# Patient Record
Sex: Female | Born: 1955 | Race: White | Hispanic: No | Marital: Single | Smoking: Former smoker
Health system: Southern US, Community
[De-identification: ages and names within clinical notes are randomized; demographics above are authoritative.]

## PROBLEM LIST (undated history)

## (undated) DIAGNOSIS — R238 Other skin changes: Secondary | ICD-10-CM

## (undated) DIAGNOSIS — Z85828 Personal history of other malignant neoplasm of skin: Secondary | ICD-10-CM

## (undated) DIAGNOSIS — Z9889 Other specified postprocedural states: Secondary | ICD-10-CM

## (undated) DIAGNOSIS — K5909 Other constipation: Secondary | ICD-10-CM

## (undated) DIAGNOSIS — Z972 Presence of dental prosthetic device (complete) (partial): Secondary | ICD-10-CM

## (undated) DIAGNOSIS — R911 Solitary pulmonary nodule: Secondary | ICD-10-CM

## (undated) DIAGNOSIS — Z973 Presence of spectacles and contact lenses: Secondary | ICD-10-CM

## (undated) DIAGNOSIS — K219 Gastro-esophageal reflux disease without esophagitis: Secondary | ICD-10-CM

## (undated) DIAGNOSIS — F419 Anxiety disorder, unspecified: Secondary | ICD-10-CM

## (undated) DIAGNOSIS — G894 Chronic pain syndrome: Secondary | ICD-10-CM

## (undated) DIAGNOSIS — J449 Chronic obstructive pulmonary disease, unspecified: Secondary | ICD-10-CM

## (undated) DIAGNOSIS — K603 Anal fistula, unspecified: Secondary | ICD-10-CM

## (undated) DIAGNOSIS — M1610 Unilateral primary osteoarthritis, unspecified hip: Secondary | ICD-10-CM

## (undated) DIAGNOSIS — I779 Disorder of arteries and arterioles, unspecified: Secondary | ICD-10-CM

## (undated) DIAGNOSIS — R233 Spontaneous ecchymoses: Secondary | ICD-10-CM

## (undated) DIAGNOSIS — Z87898 Personal history of other specified conditions: Secondary | ICD-10-CM

## (undated) DIAGNOSIS — G5620 Lesion of ulnar nerve, unspecified upper limb: Secondary | ICD-10-CM

## (undated) DIAGNOSIS — I10 Essential (primary) hypertension: Secondary | ICD-10-CM

## (undated) DIAGNOSIS — Z8619 Personal history of other infectious and parasitic diseases: Secondary | ICD-10-CM

## (undated) DIAGNOSIS — F329 Major depressive disorder, single episode, unspecified: Secondary | ICD-10-CM

## (undated) DIAGNOSIS — R112 Nausea with vomiting, unspecified: Secondary | ICD-10-CM

## (undated) DIAGNOSIS — Z8489 Family history of other specified conditions: Secondary | ICD-10-CM

## (undated) DIAGNOSIS — D013 Carcinoma in situ of anus and anal canal: Secondary | ICD-10-CM

## (undated) DIAGNOSIS — G4700 Insomnia, unspecified: Secondary | ICD-10-CM

## (undated) DIAGNOSIS — M4802 Spinal stenosis, cervical region: Secondary | ICD-10-CM

## (undated) DIAGNOSIS — M1990 Unspecified osteoarthritis, unspecified site: Secondary | ICD-10-CM

## (undated) DIAGNOSIS — F32A Depression, unspecified: Secondary | ICD-10-CM

## (undated) DIAGNOSIS — I739 Peripheral vascular disease, unspecified: Secondary | ICD-10-CM

## (undated) DIAGNOSIS — I6502 Occlusion and stenosis of left vertebral artery: Secondary | ICD-10-CM

## (undated) HISTORY — DX: Anxiety disorder, unspecified: F41.9

## (undated) HISTORY — DX: Depression, unspecified: F32.A

## (undated) HISTORY — DX: Spontaneous ecchymoses: R23.3

## (undated) HISTORY — DX: Lesion of ulnar nerve, unspecified upper limb: G56.20

## (undated) HISTORY — PX: LAPAROSCOPIC SALPINGOOPHERECTOMY: SUR795

## (undated) HISTORY — PX: COLONOSCOPY: SHX174

## (undated) HISTORY — PX: HEMORRHOID SURGERY: SHX153

## (undated) HISTORY — DX: Unilateral primary osteoarthritis, unspecified hip: M16.10

## (undated) HISTORY — PX: ABDOMINAL HYSTERECTOMY: SHX81

## (undated) HISTORY — DX: Insomnia, unspecified: G47.00

## (undated) HISTORY — DX: Solitary pulmonary nodule: R91.1

## (undated) HISTORY — DX: Gastro-esophageal reflux disease without esophagitis: K21.9

## (undated) HISTORY — DX: Chronic pain syndrome: G89.4

## (undated) HISTORY — DX: Spinal stenosis, cervical region: M48.02

## (undated) HISTORY — PX: OTHER SURGICAL HISTORY: SHX169

## (undated) HISTORY — DX: Major depressive disorder, single episode, unspecified: F32.9

## (undated) HISTORY — DX: Other skin changes: R23.8

## (undated) HISTORY — PX: CHOLECYSTECTOMY: SHX55

---

## 1999-03-03 ENCOUNTER — Encounter: Payer: Self-pay | Admitting: Emergency Medicine

## 1999-03-03 ENCOUNTER — Emergency Department (HOSPITAL_COMMUNITY): Admission: EM | Admit: 1999-03-03 | Discharge: 1999-03-03 | Payer: Self-pay | Admitting: Emergency Medicine

## 1999-03-17 ENCOUNTER — Emergency Department (HOSPITAL_COMMUNITY): Admission: EM | Admit: 1999-03-17 | Discharge: 1999-03-17 | Payer: Self-pay | Admitting: Emergency Medicine

## 1999-03-17 ENCOUNTER — Encounter: Payer: Self-pay | Admitting: Emergency Medicine

## 1999-05-16 ENCOUNTER — Encounter: Payer: Self-pay | Admitting: Orthopedic Surgery

## 1999-05-16 ENCOUNTER — Ambulatory Visit (HOSPITAL_BASED_OUTPATIENT_CLINIC_OR_DEPARTMENT_OTHER): Admission: RE | Admit: 1999-05-16 | Discharge: 1999-05-16 | Payer: Self-pay | Admitting: Orthopedic Surgery

## 1999-06-13 ENCOUNTER — Ambulatory Visit (HOSPITAL_BASED_OUTPATIENT_CLINIC_OR_DEPARTMENT_OTHER): Admission: RE | Admit: 1999-06-13 | Discharge: 1999-06-13 | Payer: Self-pay | Admitting: Orthopedic Surgery

## 2000-03-29 ENCOUNTER — Encounter: Payer: Self-pay | Admitting: Family Medicine

## 2000-03-29 ENCOUNTER — Encounter: Admission: RE | Admit: 2000-03-29 | Discharge: 2000-03-29 | Payer: Self-pay | Admitting: Family Medicine

## 2000-04-12 ENCOUNTER — Encounter: Admission: RE | Admit: 2000-04-12 | Discharge: 2000-04-12 | Payer: Self-pay | Admitting: Family Medicine

## 2000-04-12 ENCOUNTER — Encounter: Payer: Self-pay | Admitting: Family Medicine

## 2000-05-03 ENCOUNTER — Ambulatory Visit (HOSPITAL_COMMUNITY): Admission: RE | Admit: 2000-05-03 | Discharge: 2000-05-03 | Payer: Self-pay | Admitting: Gastroenterology

## 2000-05-23 ENCOUNTER — Ambulatory Visit (HOSPITAL_BASED_OUTPATIENT_CLINIC_OR_DEPARTMENT_OTHER): Admission: RE | Admit: 2000-05-23 | Discharge: 2000-05-24 | Payer: Self-pay | Admitting: Orthopedic Surgery

## 2001-09-10 HISTORY — PX: ORIF CLAVICLE FRACTURE: SUR924

## 2002-06-28 ENCOUNTER — Encounter: Payer: Self-pay | Admitting: General Surgery

## 2002-06-28 ENCOUNTER — Inpatient Hospital Stay (HOSPITAL_COMMUNITY): Admission: AC | Admit: 2002-06-28 | Discharge: 2002-07-02 | Payer: Self-pay

## 2002-06-28 ENCOUNTER — Encounter: Payer: Self-pay | Admitting: Emergency Medicine

## 2002-07-16 ENCOUNTER — Encounter: Payer: Self-pay | Admitting: Obstetrics and Gynecology

## 2002-07-16 ENCOUNTER — Ambulatory Visit (HOSPITAL_COMMUNITY): Admission: RE | Admit: 2002-07-16 | Discharge: 2002-07-16 | Payer: Self-pay | Admitting: Obstetrics and Gynecology

## 2002-07-23 ENCOUNTER — Encounter: Payer: Self-pay | Admitting: Obstetrics and Gynecology

## 2002-07-29 ENCOUNTER — Inpatient Hospital Stay (HOSPITAL_COMMUNITY): Admission: RE | Admit: 2002-07-29 | Discharge: 2002-07-31 | Payer: Self-pay | Admitting: Obstetrics and Gynecology

## 2004-07-27 ENCOUNTER — Ambulatory Visit (HOSPITAL_COMMUNITY): Admission: RE | Admit: 2004-07-27 | Discharge: 2004-07-27 | Payer: Self-pay | Admitting: Orthopedic Surgery

## 2004-08-22 ENCOUNTER — Ambulatory Visit (HOSPITAL_BASED_OUTPATIENT_CLINIC_OR_DEPARTMENT_OTHER): Admission: RE | Admit: 2004-08-22 | Discharge: 2004-08-22 | Payer: Self-pay | Admitting: Orthopedic Surgery

## 2004-10-04 ENCOUNTER — Ambulatory Visit: Payer: Self-pay | Admitting: Internal Medicine

## 2004-10-09 ENCOUNTER — Ambulatory Visit: Payer: Self-pay | Admitting: Internal Medicine

## 2004-10-10 ENCOUNTER — Ambulatory Visit: Payer: Self-pay | Admitting: *Deleted

## 2004-12-11 ENCOUNTER — Ambulatory Visit (HOSPITAL_COMMUNITY): Admission: RE | Admit: 2004-12-11 | Discharge: 2004-12-11 | Payer: Self-pay | Admitting: Orthopedic Surgery

## 2004-12-15 ENCOUNTER — Encounter (INDEPENDENT_AMBULATORY_CARE_PROVIDER_SITE_OTHER): Payer: Self-pay | Admitting: Internal Medicine

## 2005-11-05 ENCOUNTER — Ambulatory Visit (HOSPITAL_COMMUNITY): Admission: RE | Admit: 2005-11-05 | Discharge: 2005-11-05 | Payer: Self-pay | Admitting: Obstetrics and Gynecology

## 2005-11-19 ENCOUNTER — Emergency Department (HOSPITAL_COMMUNITY): Admission: EM | Admit: 2005-11-19 | Discharge: 2005-11-19 | Payer: Self-pay | Admitting: Emergency Medicine

## 2006-04-27 ENCOUNTER — Emergency Department (HOSPITAL_COMMUNITY): Admission: EM | Admit: 2006-04-27 | Discharge: 2006-04-27 | Payer: Self-pay | Admitting: Emergency Medicine

## 2006-09-10 DIAGNOSIS — M4802 Spinal stenosis, cervical region: Secondary | ICD-10-CM

## 2006-09-10 HISTORY — DX: Spinal stenosis, cervical region: M48.02

## 2006-11-07 ENCOUNTER — Ambulatory Visit (HOSPITAL_COMMUNITY): Admission: RE | Admit: 2006-11-07 | Discharge: 2006-11-07 | Payer: Self-pay | Admitting: Obstetrics and Gynecology

## 2006-11-11 ENCOUNTER — Emergency Department (HOSPITAL_COMMUNITY): Admission: EM | Admit: 2006-11-11 | Discharge: 2006-11-11 | Payer: Self-pay | Admitting: Emergency Medicine

## 2006-11-25 ENCOUNTER — Ambulatory Visit (HOSPITAL_COMMUNITY): Admission: RE | Admit: 2006-11-25 | Discharge: 2006-11-25 | Payer: Self-pay | Admitting: General Surgery

## 2006-11-25 ENCOUNTER — Encounter (INDEPENDENT_AMBULATORY_CARE_PROVIDER_SITE_OTHER): Payer: Self-pay | Admitting: Specialist

## 2006-11-30 ENCOUNTER — Emergency Department (HOSPITAL_COMMUNITY): Admission: EM | Admit: 2006-11-30 | Discharge: 2006-11-30 | Payer: Self-pay | Admitting: Family Medicine

## 2007-02-04 ENCOUNTER — Emergency Department (HOSPITAL_COMMUNITY): Admission: EM | Admit: 2007-02-04 | Discharge: 2007-02-04 | Payer: Self-pay | Admitting: Emergency Medicine

## 2008-06-17 ENCOUNTER — Emergency Department (HOSPITAL_COMMUNITY): Admission: EM | Admit: 2008-06-17 | Discharge: 2008-06-18 | Payer: Self-pay | Admitting: Emergency Medicine

## 2008-06-24 ENCOUNTER — Inpatient Hospital Stay (HOSPITAL_COMMUNITY): Admission: RE | Admit: 2008-06-24 | Discharge: 2008-06-27 | Payer: Self-pay | Admitting: Orthopedic Surgery

## 2008-08-25 ENCOUNTER — Encounter (INDEPENDENT_AMBULATORY_CARE_PROVIDER_SITE_OTHER): Payer: Self-pay | Admitting: Internal Medicine

## 2008-11-02 ENCOUNTER — Emergency Department (HOSPITAL_COMMUNITY): Admission: EM | Admit: 2008-11-02 | Discharge: 2008-11-02 | Payer: Self-pay | Admitting: Emergency Medicine

## 2009-03-09 ENCOUNTER — Encounter (INDEPENDENT_AMBULATORY_CARE_PROVIDER_SITE_OTHER): Payer: Self-pay | Admitting: Internal Medicine

## 2009-05-25 ENCOUNTER — Ambulatory Visit: Payer: Self-pay | Admitting: Infectious Diseases

## 2009-05-25 ENCOUNTER — Encounter (INDEPENDENT_AMBULATORY_CARE_PROVIDER_SITE_OTHER): Payer: Self-pay | Admitting: Internal Medicine

## 2009-05-25 ENCOUNTER — Ambulatory Visit (HOSPITAL_COMMUNITY): Admission: RE | Admit: 2009-05-25 | Discharge: 2009-05-25 | Payer: Self-pay | Admitting: Infectious Diseases

## 2009-05-25 DIAGNOSIS — IMO0002 Reserved for concepts with insufficient information to code with codable children: Secondary | ICD-10-CM | POA: Insufficient documentation

## 2009-05-25 DIAGNOSIS — R079 Chest pain, unspecified: Secondary | ICD-10-CM | POA: Insufficient documentation

## 2009-05-25 DIAGNOSIS — H547 Unspecified visual loss: Secondary | ICD-10-CM | POA: Insufficient documentation

## 2009-05-25 DIAGNOSIS — G4700 Insomnia, unspecified: Secondary | ICD-10-CM | POA: Insufficient documentation

## 2009-05-25 DIAGNOSIS — F418 Other specified anxiety disorders: Secondary | ICD-10-CM | POA: Insufficient documentation

## 2009-05-25 DIAGNOSIS — M5030 Other cervical disc degeneration, unspecified cervical region: Secondary | ICD-10-CM | POA: Insufficient documentation

## 2009-05-25 DIAGNOSIS — N809 Endometriosis, unspecified: Secondary | ICD-10-CM | POA: Insufficient documentation

## 2009-05-26 LAB — CONVERTED CEMR LAB
Cholesterol: 228 mg/dL — ABNORMAL HIGH (ref 0–200)
HCT: 42.1 % (ref 36.0–46.0)
HDL: 61 mg/dL (ref >39)
Hemoglobin: 14.9 g/dL (ref 12.0–15.0)
LDL Cholesterol: 122 mg/dL — ABNORMAL HIGH (ref 0–99)
MCHC: 35.3 g/dL (ref 30.0–36.0)
MCV: 96.1 fL (ref >=78.0)
Platelets: 248 10*3/uL (ref 150–400)
RBC: 4.38 M/uL (ref 3.87–5.11)
RDW: 13.7 % (ref 11.5–15.5)
TSH: 0.807 microintl units/mL (ref 0.350–4.5)
Total CHOL/HDL Ratio: 3.7
Total CK: 59 units/L (ref 7–177)
Triglycerides: 223 mg/dL — ABNORMAL HIGH (ref <150)
Troponin I: 0.01 ng/mL (ref <0.06)
VLDL: 45 mg/dL — ABNORMAL HIGH (ref 0–40)
WBC: 6.7 10*3/uL (ref 4.0–10.5)

## 2009-05-31 ENCOUNTER — Telehealth: Payer: Self-pay | Admitting: Licensed Clinical Social Worker

## 2009-06-08 ENCOUNTER — Encounter (INDEPENDENT_AMBULATORY_CARE_PROVIDER_SITE_OTHER): Payer: Self-pay | Admitting: Internal Medicine

## 2009-06-08 ENCOUNTER — Encounter: Payer: Self-pay | Admitting: Licensed Clinical Social Worker

## 2009-06-08 DIAGNOSIS — J984 Other disorders of lung: Secondary | ICD-10-CM | POA: Insufficient documentation

## 2009-06-09 ENCOUNTER — Ambulatory Visit (HOSPITAL_COMMUNITY): Admission: RE | Admit: 2009-06-09 | Discharge: 2009-06-09 | Payer: Self-pay | Admitting: Internal Medicine

## 2009-06-22 ENCOUNTER — Ambulatory Visit: Payer: Self-pay | Admitting: Internal Medicine

## 2009-06-22 DIAGNOSIS — K219 Gastro-esophageal reflux disease without esophagitis: Secondary | ICD-10-CM | POA: Insufficient documentation

## 2009-07-04 ENCOUNTER — Ambulatory Visit: Payer: Self-pay | Admitting: Internal Medicine

## 2009-07-04 LAB — CONVERTED CEMR LAB
OCCULT 1: NEGATIVE
OCCULT 2: NEGATIVE
OCCULT 3: NEGATIVE

## 2009-07-20 ENCOUNTER — Encounter: Admission: RE | Admit: 2009-07-20 | Discharge: 2009-08-31 | Payer: Self-pay | Admitting: Internal Medicine

## 2009-07-26 ENCOUNTER — Encounter (INDEPENDENT_AMBULATORY_CARE_PROVIDER_SITE_OTHER): Payer: Self-pay | Admitting: Internal Medicine

## 2009-07-27 ENCOUNTER — Encounter (INDEPENDENT_AMBULATORY_CARE_PROVIDER_SITE_OTHER): Payer: Self-pay | Admitting: Internal Medicine

## 2009-08-15 ENCOUNTER — Emergency Department (HOSPITAL_COMMUNITY): Admission: EM | Admit: 2009-08-15 | Discharge: 2009-08-15 | Payer: Self-pay | Admitting: Emergency Medicine

## 2009-08-17 ENCOUNTER — Ambulatory Visit: Payer: Self-pay | Admitting: Internal Medicine

## 2009-08-17 DIAGNOSIS — N951 Menopausal and female climacteric states: Secondary | ICD-10-CM | POA: Insufficient documentation

## 2009-09-12 ENCOUNTER — Telehealth: Payer: Self-pay | Admitting: Internal Medicine

## 2009-10-12 ENCOUNTER — Telehealth: Payer: Self-pay | Admitting: Internal Medicine

## 2009-10-20 ENCOUNTER — Telehealth: Payer: Self-pay | Admitting: *Deleted

## 2009-10-20 ENCOUNTER — Ambulatory Visit: Payer: Self-pay | Admitting: Internal Medicine

## 2009-10-20 ENCOUNTER — Telehealth: Payer: Self-pay | Admitting: Internal Medicine

## 2009-10-20 DIAGNOSIS — J069 Acute upper respiratory infection, unspecified: Secondary | ICD-10-CM | POA: Insufficient documentation

## 2009-11-14 ENCOUNTER — Telehealth (INDEPENDENT_AMBULATORY_CARE_PROVIDER_SITE_OTHER): Payer: Self-pay | Admitting: Internal Medicine

## 2009-12-06 ENCOUNTER — Ambulatory Visit: Payer: Self-pay | Admitting: Internal Medicine

## 2009-12-06 DIAGNOSIS — G5600 Carpal tunnel syndrome, unspecified upper limb: Secondary | ICD-10-CM | POA: Insufficient documentation

## 2010-01-05 ENCOUNTER — Telehealth (INDEPENDENT_AMBULATORY_CARE_PROVIDER_SITE_OTHER): Payer: Self-pay | Admitting: Internal Medicine

## 2010-01-18 ENCOUNTER — Telehealth (INDEPENDENT_AMBULATORY_CARE_PROVIDER_SITE_OTHER): Payer: Self-pay | Admitting: Internal Medicine

## 2010-01-18 ENCOUNTER — Encounter
Admission: RE | Admit: 2010-01-18 | Discharge: 2010-01-20 | Payer: Self-pay | Admitting: Physical Medicine and Rehabilitation

## 2010-01-20 ENCOUNTER — Ambulatory Visit: Payer: Self-pay | Admitting: Physical Medicine and Rehabilitation

## 2010-01-31 ENCOUNTER — Emergency Department (HOSPITAL_COMMUNITY): Admission: EM | Admit: 2010-01-31 | Discharge: 2010-01-31 | Payer: Self-pay | Admitting: Family Medicine

## 2010-02-01 ENCOUNTER — Telehealth (INDEPENDENT_AMBULATORY_CARE_PROVIDER_SITE_OTHER): Payer: Self-pay | Admitting: Internal Medicine

## 2010-05-31 ENCOUNTER — Encounter
Admission: RE | Admit: 2010-05-31 | Discharge: 2010-08-23 | Payer: Self-pay | Source: Home / Self Care | Attending: Physical Medicine and Rehabilitation | Admitting: Physical Medicine and Rehabilitation

## 2010-06-02 ENCOUNTER — Ambulatory Visit: Payer: Self-pay | Admitting: Physical Medicine and Rehabilitation

## 2010-06-06 ENCOUNTER — Ambulatory Visit: Payer: Self-pay | Admitting: Internal Medicine

## 2010-06-06 ENCOUNTER — Ambulatory Visit (HOSPITAL_COMMUNITY)
Admission: RE | Admit: 2010-06-06 | Discharge: 2010-06-06 | Payer: Self-pay | Admitting: Physical Medicine and Rehabilitation

## 2010-06-06 DIAGNOSIS — R634 Abnormal weight loss: Secondary | ICD-10-CM | POA: Insufficient documentation

## 2010-06-06 DIAGNOSIS — IMO0002 Reserved for concepts with insufficient information to code with codable children: Secondary | ICD-10-CM | POA: Insufficient documentation

## 2010-06-07 ENCOUNTER — Encounter: Payer: Self-pay | Admitting: Internal Medicine

## 2010-06-14 ENCOUNTER — Ambulatory Visit (HOSPITAL_COMMUNITY): Admission: RE | Admit: 2010-06-14 | Discharge: 2010-06-14 | Payer: Self-pay | Admitting: Internal Medicine

## 2010-06-14 LAB — CONVERTED CEMR LAB
ALT: 61 units/L — ABNORMAL HIGH (ref 0–35)
AST: 66 units/L — ABNORMAL HIGH (ref 0–37)
Albumin: 4.5 g/dL (ref 3.5–5.2)
Alkaline Phosphatase: 103 units/L (ref 39–117)
BUN: 11 mg/dL (ref 6–23)
Basophils Absolute: 0 10*3/uL (ref 0.0–0.1)
Basophils Relative: 1 % (ref 0–1)
CO2: 22 meq/L (ref 19–32)
Calcium: 9.5 mg/dL (ref 8.4–10.5)
Chloride: 104 meq/L (ref 96–112)
Creatinine, Ser: 0.58 mg/dL (ref 0.40–1.20)
Eosinophils Absolute: 0.1 10*3/uL (ref 0.0–0.7)
Eosinophils Relative: 2 % (ref 0–5)
Glucose, Bld: 84 mg/dL (ref 70–99)
HCT: 42.8 % (ref 36.0–46.0)
Hemoglobin: 14.6 g/dL (ref 12.0–15.0)
Lymphocytes Relative: 31 % (ref 12–46)
Lymphs Abs: 1.8 10*3/uL (ref 0.7–4.0)
MCHC: 34.1 g/dL (ref 30.0–36.0)
MCV: 96.4 fL (ref >=78.0)
Monocytes Absolute: 0.6 10*3/uL (ref 0.1–1.0)
Monocytes Relative: 11 % (ref 3–12)
Neutro Abs: 3.2 10*3/uL (ref 1.7–7.7)
Neutrophils Relative %: 56 % (ref 43–77)
Platelets: 245 10*3/uL (ref 150–400)
Potassium: 4.6 meq/L (ref 3.5–5.3)
RBC: 4.44 M/uL (ref 3.87–5.11)
RDW: 13.8 % (ref 11.5–15.5)
Sodium: 139 meq/L (ref 135–145)
TSH: 1.113 microintl units/mL (ref 0.350–4.5)
Total Bilirubin: 0.5 mg/dL (ref 0.3–1.2)
Total Protein: 8.3 g/dL (ref 6.0–8.3)
Vitamin B-12: 717 pg/mL (ref 211–911)
WBC: 5.7 10*3/uL (ref 4.0–10.5)

## 2010-06-14 LAB — HM MAMMOGRAPHY: HM Mammogram: NEGATIVE

## 2010-06-15 ENCOUNTER — Ambulatory Visit: Payer: Self-pay | Admitting: Internal Medicine

## 2010-06-15 ENCOUNTER — Encounter: Payer: Self-pay | Admitting: Internal Medicine

## 2010-06-15 DIAGNOSIS — Z8619 Personal history of other infectious and parasitic diseases: Secondary | ICD-10-CM | POA: Insufficient documentation

## 2010-06-20 ENCOUNTER — Telehealth (INDEPENDENT_AMBULATORY_CARE_PROVIDER_SITE_OTHER): Payer: Self-pay | Admitting: *Deleted

## 2010-06-22 LAB — CONVERTED CEMR LAB
HCV Ab: REACTIVE — AB
Hep A Total Ab: POSITIVE — AB
Hep B Core Total Ab: NEGATIVE
Hep B S Ab: NEGATIVE
Hepatitis B Surface Ag: NEGATIVE

## 2010-06-26 ENCOUNTER — Telehealth: Payer: Self-pay | Admitting: Internal Medicine

## 2010-06-27 ENCOUNTER — Ambulatory Visit (HOSPITAL_COMMUNITY): Admission: RE | Admit: 2010-06-27 | Discharge: 2010-06-27 | Payer: Self-pay | Admitting: Internal Medicine

## 2010-06-30 ENCOUNTER — Ambulatory Visit: Payer: Self-pay | Admitting: Physical Medicine and Rehabilitation

## 2010-07-11 ENCOUNTER — Encounter
Admission: RE | Admit: 2010-07-11 | Discharge: 2010-08-23 | Payer: Self-pay | Source: Home / Self Care | Attending: Physical Medicine & Rehabilitation | Admitting: Physical Medicine & Rehabilitation

## 2010-07-16 ENCOUNTER — Emergency Department (HOSPITAL_COMMUNITY)
Admission: EM | Admit: 2010-07-16 | Discharge: 2010-07-16 | Payer: Self-pay | Source: Home / Self Care | Admitting: Emergency Medicine

## 2010-07-16 DIAGNOSIS — S92909A Unspecified fracture of unspecified foot, initial encounter for closed fracture: Secondary | ICD-10-CM | POA: Insufficient documentation

## 2010-07-17 ENCOUNTER — Ambulatory Visit: Payer: Self-pay | Admitting: Physical Medicine & Rehabilitation

## 2010-08-14 ENCOUNTER — Ambulatory Visit (HOSPITAL_COMMUNITY)
Admission: RE | Admit: 2010-08-14 | Discharge: 2010-08-14 | Payer: Self-pay | Source: Home / Self Care | Admitting: Internal Medicine

## 2010-08-14 ENCOUNTER — Ambulatory Visit: Payer: Self-pay | Admitting: Internal Medicine

## 2010-08-16 ENCOUNTER — Ambulatory Visit: Payer: Self-pay | Admitting: Physical Medicine and Rehabilitation

## 2010-08-22 ENCOUNTER — Ambulatory Visit: Payer: Self-pay | Admitting: Internal Medicine

## 2010-08-22 DIAGNOSIS — R1011 Right upper quadrant pain: Secondary | ICD-10-CM | POA: Insufficient documentation

## 2010-08-22 LAB — CONVERTED CEMR LAB
ALT: 64 units/L — ABNORMAL HIGH (ref 0–35)
AST: 78 units/L — ABNORMAL HIGH (ref 0–37)
Albumin: 4.3 g/dL (ref 3.5–5.2)
Alkaline Phosphatase: 114 units/L (ref 39–117)
BUN: 8 mg/dL (ref 6–23)
Basophils Absolute: 0 10*3/uL (ref 0.0–0.1)
Basophils Relative: 0 % (ref 0–1)
CO2: 27 meq/L (ref 19–32)
Calcium: 9.8 mg/dL (ref 8.4–10.5)
Chloride: 101 meq/L (ref 96–112)
Creatinine, Ser: 0.54 mg/dL (ref 0.40–1.20)
Eosinophils Absolute: 0.2 10*3/uL (ref 0.0–0.7)
Eosinophils Relative: 3 % (ref 0–5)
Glucose, Bld: 95 mg/dL (ref 70–99)
HCT: 41.5 % (ref 36.0–46.0)
HCV Quantitative: 653 intl units/mL — ABNORMAL HIGH (ref <43)
Hemoglobin: 14.1 g/dL (ref 12.0–15.0)
INR: 0.97 (ref <1.50)
Lymphocytes Relative: 27 % (ref 12–46)
Lymphs Abs: 1.7 10*3/uL (ref 0.7–4.0)
MCHC: 34 g/dL (ref 30.0–36.0)
MCV: 95.4 fL (ref 78.0–100.0)
Monocytes Absolute: 0.6 10*3/uL (ref 0.1–1.0)
Monocytes Relative: 10 % (ref 3–12)
Neutro Abs: 3.8 10*3/uL (ref 1.7–7.7)
Neutrophils Relative %: 60 % (ref 43–77)
Platelets: 227 10*3/uL (ref 150–400)
Potassium: 4.3 meq/L (ref 3.5–5.3)
Prothrombin Time: 13.1 s (ref 11.6–15.2)
RBC: 4.35 M/uL (ref 3.87–5.11)
RDW: 13.3 % (ref 11.5–15.5)
Sodium: 136 meq/L (ref 135–145)
Total Bilirubin: 0.4 mg/dL (ref 0.3–1.2)
Total Protein: 7.6 g/dL (ref 6.0–8.3)
WBC: 6.3 10*3/uL (ref 4.0–10.5)

## 2010-08-23 ENCOUNTER — Encounter: Payer: Self-pay | Admitting: Internal Medicine

## 2010-08-23 ENCOUNTER — Ambulatory Visit: Payer: Self-pay | Admitting: Physical Medicine and Rehabilitation

## 2010-09-05 ENCOUNTER — Ambulatory Visit: Payer: Self-pay

## 2010-09-12 ENCOUNTER — Ambulatory Visit: Admission: RE | Admit: 2010-09-12 | Discharge: 2010-09-12 | Payer: Self-pay | Source: Home / Self Care

## 2010-09-12 ENCOUNTER — Ambulatory Visit
Admission: RE | Admit: 2010-09-12 | Discharge: 2010-09-12 | Payer: Self-pay | Source: Home / Self Care | Attending: Family Medicine | Admitting: Family Medicine

## 2010-09-12 DIAGNOSIS — S6390XA Sprain of unspecified part of unspecified wrist and hand, initial encounter: Secondary | ICD-10-CM | POA: Insufficient documentation

## 2010-09-25 ENCOUNTER — Encounter
Admission: RE | Admit: 2010-09-25 | Discharge: 2010-10-10 | Payer: Self-pay | Source: Home / Self Care | Attending: Physical Medicine & Rehabilitation | Admitting: Physical Medicine & Rehabilitation

## 2010-09-28 ENCOUNTER — Encounter: Payer: Self-pay | Admitting: Family Medicine

## 2010-09-28 ENCOUNTER — Ambulatory Visit
Admission: RE | Admit: 2010-09-28 | Discharge: 2010-09-28 | Payer: Self-pay | Source: Home / Self Care | Attending: Physical Medicine & Rehabilitation | Admitting: Physical Medicine & Rehabilitation

## 2010-10-10 NOTE — Progress Notes (Signed)
Summary: refill/gg  Phone Note Refill Request  on October 20, 2009 2:22 PM  Refills Requested: Medication #1:  KLONOPIN 0.5 MG TABS 1 tab daily as needed   Last Refilled: 08/15/2009  Method Requested: Fax to Local Pharmacy Initial call taken by: Merrie Roof RN,  October 20, 2009 2:23 PM  Follow-up for Phone Call        Refill approved-nurse to complete Follow-up by: Bethel Born MD,  October 20, 2009 3:13 PM     Appended Document: refill/gg Mamie faxed the prescription to pharmacy.

## 2010-10-10 NOTE — Progress Notes (Signed)
  Phone Note Call from Patient   Caller: Patient Summary of Call: pt calls c/o T 100-101, diarrhea,decreased appetite, chills, deep productive cough w/olive green sputum, pain in chest when coughing x 5 days, nothing has helped, nothing aggravates symptoms. appt given Initial call taken by: Marin Roberts RN,  October 21, 2009 6:47 PM

## 2010-10-10 NOTE — Progress Notes (Signed)
Summary: premarin/ hla  Phone Note Refill Request Message from:  Pharmacy on September 12, 2009 3:49 PM  Refills Requested: Medication #1:  PREMARIN 0.625 MG TABS 1 tab daily Initial call taken by: Marin Roberts RN,  September 12, 2009 3:49 PM  Follow-up for Phone Call        Refill approved-nurse to complete Follow-up by: Blanch Media MD,  September 12, 2009 4:07 PM    Prescriptions: PREMARIN 0.625 MG TABS (ESTROGENS CONJUGATED) 1 tab daily  #30 x 0   Entered and Authorized by:   Blanch Media MD   Signed by:   Blanch Media MD on 09/12/2009   Method used:   Electronically to        [facility]* (retail)       [address]       [place], [place]  [address]       Ph: [phone]       Fax: [phone]   RxID:   [id number]

## 2010-10-10 NOTE — Assessment & Plan Note (Signed)
Summary: CHECKUP/SB.   Vital Signs:  Patient profile:   55 year old female Height:      66 inches (167.64 cm) Weight:      113.7 pounds (51.68 kg) BMI:     18.42 Temp:     97.6 degrees F (36.44 degrees C) oral Pulse rate:   79 / minute BP sitting:   130 / 80  (left arm)  Vitals Entered By: Blenda Mounts (December 06, 2009 9:43 AM)/Gladys Herbin, RN December 06, 2009 9:43 AM CC: hand problems, numbness in arms, and right leg, lower back pain, Depression, Back Pain Is Patient Diabetic? No Pain Assessment Patient in pain? yes     Location: right leg, lower back Intensity: 6 Type: dull Onset of pain  Constant Nutritional Status BMI of < 19 = underweight  Have you ever been in a relationship where you felt threatened, hurt or afraid?No   Does patient need assistance? Functional Status Self care Ambulation Normal   Primary Care Provider:  Silvestre Gunner MD  CC:  hand problems, numbness in arms, and right leg, lower back pain, Depression, and Back Pain.  History of Present Illness: Ms. 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 The last time I saw her (12/10), I had referred her to a hand specialist at [facility] for likely carpal tunnel syndrome. She said they need EMG studies done before they can do surgery, but she cannot afford the EMG studies there (and these results are necessary for her disability claim, which in turn is necessary to be eligible for medicaid).   Currently, her low back pain is the most debilitating for her. She has known disc disease in her cervical spine and the hand specialists ordered an MRI of her thoracic-cerivcal spine to determine if some of her hand/arm numbness could be 2/2 cervical disease; she has not had imaging done of her lumbar spine. She c/o severe pain in her R low back that radiates down her R leg and has progressively worsened since she broke her  pelvis and other bones in her horse accident several years ago. She takes 6-7 Ibuprofen daily but says this helps minimally. We have tried tramadol and physical therapy in the past without success. She is also taking low-dose gabapentin but says it knocks her out. She did try a cousin's Loracet once (she said one pill lasted her the entire week because she only took a little bit of it at a time) and said that helped a lot. She says she does not like taking pain medication, but she cannot bear the pain as it currently is. She is amenable to trying anything at this point.  Depression History:      The patient denies a depressed mood most of the day but notes a diminished interest in her usual daily activities.        Suicide risk questions reveal that she wishes that she were dead, she has thought about ending her life, and she has even planned how to end her life.  The patient denies that she feels like life is not worth living.        Comments:  medicine is helping .   Preventive Screening-Counseling & Management  Alcohol-Tobacco     Alcohol drinks/day: 0     Smoking Status: quit     Smoking Cessation Counseling: yes     Packs/Day: 4 cigs/day  Year Started: started 30 yrs ago     Year Quit: 2010  October  Caffeine-Diet-Exercise     Caffeine use/day: 2 cups/morning     Does Patient Exercise: no  Current Medications (verified): 1)  Gabapentin 100 Mg Caps (Gabapentin) .Marland Kitchen.. 1 Tab Three Times Daily 2)  Ambien 10 Mg Tabs (Zolpidem Tartrate) .Marland Kitchen.. 1 Tab At Bedtime As Needed 3)  Klonopin 0.5 Mg Tabs (Clonazepam) .Marland Kitchen.. 1 Tab Daily As Needed 4)  Premarin 0.625 Mg Tabs (Estrogens Conjugated) .Marland Kitchen.. 1 Tab Daily 5)  Prozac 20 Mg Caps (Fluoxetine Hcl) .Marland Kitchen.. 1 Tab Daily 6)  Omeprazole 20 Mg Cpdr (Omeprazole) .Marland Kitchen.. 1 Tab Daily 7)  Proair Hfa 108 (90 Base) Mcg/act Aers (Albuterol Sulfate) .... 2 Puffs Every 4-6 Hrs As Needed For Shortness of Breath 8)  Tussin Cough 15 Mg/17ml Syrp (Dextromethorphan Hbr)  .... Two Times A Day 9)  Azithromycin 250 Mg Tabs (Azithromycin) .... 2 Tabs Today, Then 1 Tab A Day For Next 3 Days 10)  Tylenol With Codeine #3 300-30 Mg Tabs (Acetaminophen-Codeine) .... 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 Was initially denied disability, now appealing that through an attorney. Denied medicaid. Lives by herself, widowed 2 yrs ago Two adult children, one lives locally. Limited support system.  Current smoker- 1 PPD x 30 yrs, now down to 4 cigs/day Alcohol- none Drugs- none  Risk Factors: Alcohol Use: 0 (12/06/2009) Caffeine Use: 2 cups/morning (12/06/2009) Exercise: no (12/06/2009)  Risk Factors: Smoking Status: quit (12/06/2009) Packs/Day: 4 cigs/day (12/06/2009)  Review of Systems      See HPI  Physical Exam  General:  Well-developed,well-nourished,in no acute distress; alert,appropriate and cooperative throughout examination Head:  Normocephalic and atraumatic without obvious abnormalities. No apparent alopecia or balding. Lungs:  Normal respiratory effort, chest expands symmetrically. Lungs are clear to auscultation, no crackles or wheezes. Heart:  Normal rate and regular rhythm. S1 and S2 normal without gallop, murmur, click, rub or other extra sounds. Msk:  R straight leg + (left -), normal ROM, pain with extension of back but not with flexion Neurologic:  alert & oriented X3.   Psych:  Cognition and judgment appear intact. Alert and cooperative with normal attention span and concentration. No apparent delusions, illusions,  hallucinations   Impression & Recommendations:  Problem # 1:  BACK PAIN, LUMBAR, WITH RADICULOPATHY (ICD-724.4) I discussed this case extensively with Dr. Meredith Pel. I made a referral to the [facility] for her low back to be evaluated. She will likely need MRI of her low back; however, given that she is in the process of applying for disability and medicaid (with the help of an attorney), I will wait for the time being as it will be cheaper for her to do when she finally gets her medicaid. She has tried physical therapy and various meds (ibuprofen, tramadol, etc.) without relief. Given the duration and severity of her symptoms, the fact that she has a known reason to have significant pain, and her lack of a history of drug or alcohol abuse, I have decided to go ahead and prescribe Tylenol with Codeine (#2,  not #3, as #2 has 15 mg codeine in it compared to 30 mg with #3) with her and she agreed to only take it as needed. I'm hoping that the medication will help her pain, at least until she can get in to be seen at [facility].   Her updated medication list for this problem includes:    Tylenol With Codeine #3 300-30 Mg Tabs (Acetaminophen-codeine) .Marland Kitchen... Take 1-2 tabs q6 hrs as needed pain  Problem # 2:  CARPAL TUNNEL SYNDROME, BILATERAL (ICD-354.0) Gladys called [facility] (in [place]) and they do accept the orange card for EMG studies, which she needs done prior to surgery per the hand surgeon at [facility]. I have referred her there. Hopefully, after she is able to get this test done, her disability and then her medicaid will come through.   Problem # 3:  DEPRESSION (ICD-311) Ms. Lesmeister is going through a difficult time right now due to her increase in pain and her mother's physical condition (she has aortic stenosis and will be having surgery soon). She feels like her mood is "okay" but some days she just feels "down in the  dumps." We discussed the option of increasing her Prozac temporarily; however, she feels like her mind is "the only thing that's under my control" and would like to stay at the current dose for now. She agreed to call me if her depression worsened.  Her updated medication list for this problem includes:    Klonopin 0.5 Mg Tabs (Clonazepam) .Marland Kitchen... 1 tab daily as needed    Prozac 20 Mg Caps (Fluoxetine hcl) .Marland Kitchen... 1 tab daily  Complete Medication List: 1)  Gabapentin 100 Mg Caps (Gabapentin) .Marland Kitchen.. 1 tab three times daily 2)  Ambien 10 Mg Tabs (Zolpidem tartrate) .Marland Kitchen.. 1 tab at bedtime as needed 3)  Klonopin 0.5 Mg Tabs (Clonazepam) .Marland Kitchen.. 1 tab daily as needed 4)  Premarin 0.625 Mg Tabs (Estrogens conjugated) .Marland Kitchen.. 1 tab daily 5)  Prozac 20 Mg Caps (Fluoxetine hcl) .Marland Kitchen.. 1 tab daily 6)  Omeprazole 20 Mg Cpdr (Omeprazole) .Marland Kitchen.. 1 tab daily 7)  Proair Hfa 108 (90 Base) Mcg/act Aers (Albuterol sulfate) .... 2 puffs every 4-6 hrs as needed for shortness of breath 8)  Tussin Cough 15 Mg/84ml Syrp (Dextromethorphan hbr) .... Two times a day 9)  Azithromycin 250 Mg Tabs (Azithromycin) .... 2 tabs today, then 1 tab a day for next 3 days 10)  Tylenol With Codeine #3 300-30 Mg Tabs (Acetaminophen-codeine) .... Take 1-2 tabs q6 hrs as needed pain  Other Orders: Pain Clinic Referral (Pain)   Patient Instructions: 1)  Please schedule a follow-up appointment in 1-2 months with Dr. Tobie Lords. 2)  I have prescribed for you a medication called Tylenol #2 (Tylenol + Codeine). Please only use this as directed and only as needed. 3)  I have referred you to [facility] in [place], which will do your EMG testing once you have your orange card. They will call you with an appointment. 4)  I have also referred you to [facility] at [facility]. They will also call you with an appointment.  Prevention & Chronic Care Immunizations   Influenza vaccine: Not documented    Influenza vaccine deferral: Deferred  (10/20/2009)    Tetanus booster: Not documented   Td booster deferral: Deferred  (10/20/2009)    Pneumococcal vaccine: Not documented  Colorectal Screening   Hemoccult: Not documented  Hemoccult action/deferral: Ordered  (06/22/2009)    Colonoscopy: Not documented   Colonoscopy action/deferral: Refused  (10/20/2009)  Other Screening   Pap smear: Not documented   Pap smear due: 05/11/2010    Mammogram: ASSESSMENT: Negative - BI-RADS 1^MS DIGITAL SCREENING W/ IMPLANTS  (06/09/2009)   Mammogram action/deferral: Ordered  (05/25/2009)   Smoking status: quit  (12/06/2009)  Lipids   Total Cholesterol: 228  (05/25/2009)   LDL: 122  (05/25/2009)   LDL Direct: Not documented   HDL: 61  (05/25/2009)   Triglycerides: 223  (05/25/2009)

## 2010-10-10 NOTE — Miscellaneous (Signed)
Summary: HIPAA Restrictions  HIPAA Restrictions   Imported By: Florinda Marker 05/25/2009 15:21:35  _____________________________________________________________________  External Attachment:    Type:   Image     Comment:   External Document

## 2010-10-10 NOTE — Assessment & Plan Note (Signed)
Summary: NEW TO CLINIC/CROHN'S;HEPATITIS B/DS   Vital Signs:  Patient profile:   55 year old female Height:      66 inches (167.64 cm) Weight:      116.8 pounds (53.09 kg) BMI:     18.92 Temp:     97.6 degrees F oral Pulse rate:   72 / minute BP sitting:   120 / 74  (right arm)  Vitals Entered By: Chinita Pester RN (May 25, 2009 10:33 AM)  CC: New to Clinic/MD.  Depression,no feeling in her hands also back pain/broken pelvic.  Left breast pain. Difficulty seeing/reading. Is Patient Diabetic? No Pain Assessment Patient in pain? yes     Location: back Intensity: 7 Type: aching Onset of pain  Chronic/constant Nutritional Status BMI of < 19 = underweight  Have you ever been in a relationship where you felt threatened, hurt or afraid?No   Does patient need assistance? Functional Status Self care Ambulation Normal   CC:  New to Clinic/MD.  Depression and no feeling in her hands also back pain/broken pelvic.  Left breast pain. Difficulty seeing/reading.Marland Kitchen  History of Present Illness: Amanda Bennett is a 55 yo F, new to clinic, who presents to establish care. She has been followed primarily by Dr. Barnet Pall of [facility] for her gynecological issues. She has several concerns, the first of which is persistent back pain. She was trampled by a horse six years ago that resulted in a broken pelvis, clavicle, and scapula, which has required multiple surgeries for repair. Since that time, she has had low back pain 2/2 pelvic misalignment and subsequent arthritic changes as well as cervical back pain that she said was identified as degen disc disease on imaging. The low back pain is associated with numbness and pain down her R leg, and she says she drags her R leg when she walks. She is followed by Dr. Ranell Patrick of [facility] for her back pain, and says he would like her to do PT 3x/wk but she is unable to afford this.   She also reports numbness and pain in both  hands. 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 Aileen Pilot of [facility]. She cannot afford surgery at this time.  She says that her vision has gotten progessively worse in the past six years but she has not seen an eye doctor.  Acutely, 3-4 days ago left side of chest under breast began to hurt. The pain is constant, "pressure, like I'm congested", is pleuritic, worsens with bending over, and is assoc with diaphoresis yest & a mildly prod cough. No nausea, trauma.  Finally, she has been very depressed since the death of her husband 2 years ago. She describes a loss of appetite, weight loss, anhedonia, difficulty sleeping, and recurrent thoughts of death. She does not wish to hurt herself, as she is a religious person and does not believe in suicide, but she often thinks about wanting to not be alive anymore. She says, "I don't know how much longer I can go on."   Depression History:      The patient is having a depressed mood most of the day and has a diminished interest in her usual daily activities.        Comments:  States she has hx of Anxious.  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 Was initially denied disability, now  appealing that through an attorney. Denied medicaid. Lives by herself, widowed 2 yrs ago Two adult children, one lives locally. Limited support system.  Current smoker- 1 PPD x 30 yrs, now down to 4 cigs/day Alcohol- none Drugs- noneSmoking Status:  current Packs/Day:  4 cigs/day Does Patient Exercise:  no Caffeine use/day:  2 cups/morning  Review of Systems General:  Complains of loss of appetite and sleep disorder. Eyes:  Complains of vision loss-both eyes. CV:  Complains of chest pain or discomfort. Resp:  Complains of chest pain with inspiration and cough. GI:  Complains of loss of appetite. Neuro:  Complains of numbness and tingling. Psych:  Complains of depression and suicidal thoughts/plans.  Physical Exam  General:  alert, well-developed, and well-nourished.   Head:  normocephalic and atraumatic.   Eyes:  pupils equal, pupils round, and pupils reactive to light.   Mouth:  good dentition and pharynx pink and moist.   Neck:  supple and no masses.   Chest Wall:  slight tenderness left sternal border (replicates pain felt with deep inspiration) Lungs:  normal respiratory effort, no intercostal retractions, no accessory muscle use, and normal breath sounds.   Heart:  normal rate, regular rhythm, no murmur, no gallop, and no rub.   Abdomen:  soft, non-tender, normal bowel sounds, no distention, and no masses.   Pulses:  R dorsalis pedis normal and L dorsalis pedis normal.   Neurologic:  alert & oriented X3, cranial nerves II-XII intact, and strength normal in all extremities. Decreased sensation RLE, L thumb, R and L ring fingers   Skin:  no rashes.   Cervical Nodes:  no anterior cervical adenopathy.   Psych:  Oriented X3, normally interactive, good eye contact, and tearful.      Impression & Recommendations:  Problem # 1:  CHEST PAIN (ICD-786.50) Pt with atypical CP of 3-4 days duration. Cardiac etiology and PE are unlikely, but given the fact that she is a smoker and that  her mother had an MI at age 63, we checked an EKG, cardiac panel, and D-dimer. EKG, cardiac markers, and D-dimer were all normal. The chest pain is most consistent with musculoskeletal origin or costochondritis. Does not need further w/u at this time.  Orders: 12 Lead EKG (12 Lead EKG) T-CK MB Fract ([id number]) T-Troponin I ([id number]) T-D-Dimer Fibrin Derivatives Quantitive [id number])  Problem # 2:  DEPRESSION (ICD-311) I am very concerned about her description. She is under a tremendous amount of stress with her pain, social difficulties, and lack of support network (though her daughter is coming to live with her starting this weekend). She promised me that she is not at risk of hurting herself because she is a religious person and does not believe in suicide. However, I am definitely concerned it could get to this point eventually. After  discussing this with her, I prescribed a low dose of Celexa, which may need to be increased after several weeks. I also submitted a social work referral and she spoke with Rudell Cobb about getting financial assistance with her medications. I'm hopeful that if she is able to afford more medications, her pain will improve and thererfore her depression will as well. I am also hopeful that she is approved for disability and subsequently medicaid. I will see her in clinic again in 2-3 weeks, and she promised me that if in the interim she has thoughts of hurting herself that she will get immediately.   Her updated medication list for this problem includes:    Klonopin 0.5 Mg Tabs (Clonazepam) .Marland Kitchen... 1 tab as needed    Celexa 10 Mg Tabs (Citalopram hydrobromide) .Marland Kitchen... 1 tab daily  Orders: T-TSH [id number]) Social Work Referral (Social )  Problem # 3:  BACK PAIN, LUMBAR, WITH RADICULOPATHY (ICD-724.4) Pt is unable to pay for pain medications given her lack of insurance and lack of income. I am hopeful that Rudell Cobb and social work will be able to assist  her. She also has a disability claims appeal pending, which would help her tremendously. I will address this issue more at her next visit in 2-3 weeks and hopefully can prescribe something cheap that will help her pain.  Orders: Social Work Referral (Social )  Problem # 4:  VISUAL ACUITY, DECREASED (ICD-369.9) She c/o decreased visual acuity progressively over the past six years. She has not been to see an eye doctor due to inability to pay. I will address this more at her next visit and hopefully can get her in to see an ophthalmologist soon. May have to wait until her disability claim is approved and/or she is approved for medicaid.  Problem # 5:  TOBACCO ABUSE (ICD-305.1) Ms. Jablonski is a current smoker, previously smoked 1 PPD x 30 years but is trying to quit and is now down to 4 cigs/day. We spoke about the importance of quitting, and I complimented her ability to cut down. She said she is hoping that once all this acute stress gets better she will be able to quit completely. She is interested in trying the patch.   Complete Medication List: 1)  Gabapentin 100 Mg Caps (Gabapentin) .Marland Kitchen.. 1 tab three times daily 2)  Ambien 10 Mg Tabs (Zolpidem tartrate) .Marland Kitchen.. 1 tab at bedtime as needed 3)  Klonopin 0.5 Mg Tabs (Clonazepam) .Marland Kitchen.. 1 tab as needed 4)  Premarin 0.625 Mg Tabs (Estrogens conjugated) .Marland Kitchen.. 1 tab daily 5)  Celexa 10 Mg Tabs (Citalopram hydrobromide) .Marland Kitchen.. 1 tab daily  Other Orders: Mammogram (Screening) (Mammo) T-Lipid Profile ([id number]) T-CBC No Diff ([id number])  Patient Instructions: 1)  Please schedule a follow-up appointment in 2-4 weeks with Dr. Tobie Lords. 2)  Call the clinic at [phone] if you have any specific concerns. Prescriptions: CELEXA 10 MG TABS (CITALOPRAM HYDROBROMIDE) 1 tab daily  #30 x 3   Entered and Authorized by:   Silvestre Gunner MD   Signed by:   Silvestre Gunner MD on 05/25/2009   Method used:   Print then Give to Patient   RxID:    [id number]  Process Orders Check Orders Results:     [facility]: ABN not required for this insurance Tests Sent for requisitioning (May 26, 2009 2:45 PM):     05/25/2009: [facility] -- T-Lipid Profile [id number] (signed)     05/25/2009: [facility] -- T-CBC No  Diff [[id number]] (signed)     05/25/2009: [facility] -- T-CK MB Fract [id number] (signed)     05/25/2009: [facility] -- T-Troponin I [phone] (signed)     05/25/2009: [facility] -- T-TSH [id number] (signed)     05/25/2009: [facility] -- T-D-Dimer Fibrin Derivatives Quantitive [id number] (signed)    Prevention & Chronic Care Immunizations   Influenza vaccine: Not documented    Tetanus booster: Not documented    Pneumococcal vaccine: Not documented  Colorectal Screening   Hemoccult: Not documented    Colonoscopy: Not documented  Other Screening   Pap smear: Not documented    Mammogram: Not documented   Mammogram action/deferral: Ordered  (05/25/2009)   Smoking status: current  (05/25/2009)   Smoking cessation counseling: yes  (05/25/2009)  Lipids   Total Cholesterol: Not documented   LDL: Not documented   LDL Direct: Not documented   HDL: Not documented   Triglycerides: Not documented   Nursing Instructions: Schedule screening mammogram (see order)

## 2010-10-10 NOTE — Miscellaneous (Signed)
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 Last CT was 10/09 and radiologist did not see them anymore. No further w/u needed.

## 2010-10-10 NOTE — Assessment & Plan Note (Signed)
Summary: FU/SB.   Vital Signs:  Patient profile:   55 year old female Height:      66 inches (167.64 cm) Weight:      117.5 pounds (53.41 kg) BMI:     19.03 O2 Sat:      96 % on Room air Temp:     97.7 degrees F (36.50 degrees C) oral Pulse rate:   73 / minute BP sitting:   113 / 74  (right arm)  Vitals Entered By: Chinita Pester RN (June 22, 2009 1:41 PM)  O2 Flow:  Room air CC: Wheezing and dry cough;  has taken Mucinex/Vit C without any. Felled aboot 3 wks. ago, Depression Is Patient Diabetic? No Pain Assessment Patient in pain? yes     Location: back Intensity: 6 Type: aching Onset of pain  Chronic Nutritional Status BMI of < 19 = underweight  Have you ever been in a relationship where you felt threatened, hurt or afraid?No   Does patient need assistance? Functional Status Self care Ambulation Normal   CC:  Wheezing and dry cough;  has taken Mucinex/Vit C without any. Felled aboot 3 wks. ago and Depression.  History of Present Illness: Amanda Bennett is a 55 yo F with PMH in EMR who presents for f/u for her depression and pain. She says that overall her pain has improved, though she fell 3 weeks ago when her legs gave out which caused her to have significant pain for a 2 week period. Her depression has also improved some with her daughter currently living with her, but she says she is still depressed and that the Celexa we started at her last visit only made her paranoid (so she stopped taking it).   Finally, she reports chest "pain/congestion" assoc with wheezing and progressive DOE for the past 2 months. Her symptoms are worst at night. Denies cough. She tried mucinex for 2 weeks with no benefit.   Depression History:      The patient denies a depressed mood most of the day and a diminished interest in her usual daily activities.        Comments:  Celexa -" had the opposite effect.".   Preventive Screening-Counseling & Management  Alcohol-Tobacco     Alcohol  drinks/day: 0     Smoking Status: current     Smoking Cessation Counseling: yes     Packs/Day: 4 cigs/day     Year Started: started 30 yrs ago  Caffeine-Diet-Exercise     Caffeine use/day: 2 cups/morning     Does Patient Exercise: no  Current Medications (verified): 1)  Gabapentin 100 Mg Caps (Gabapentin) .Marland Kitchen.. 1 Tab Three Times Daily 2)  Ambien 10 Mg Tabs (Zolpidem Tartrate) .Marland Kitchen.. 1 Tab At Bedtime As Needed 3)  Klonopin 0.5 Mg Tabs (Clonazepam) .Marland Kitchen.. 1 Tab Daily As Needed 4)  Premarin 0.625 Mg Tabs (Estrogens Conjugated) .Marland Kitchen.. 1 Tab Daily 5)  Prozac 20 Mg Caps (Fluoxetine Hcl) .Marland Kitchen.. 1 Tab Daily 6)  Omeprazole 20 Mg Cpdr (Omeprazole) .Marland Kitchen.. 1 Tab Daily 7)  Proair Hfa 108 (90 Base) Mcg/act Aers (Albuterol Sulfate) .... 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 Was  initially denied disability, now appealing that through an attorney. Denied medicaid. Lives by herself, widowed 2 yrs ago Two adult children, one lives locally. Limited support system.  Current smoker- 1 PPD x 30 yrs, now down to 4 cigs/day Alcohol- none Drugs- none  Risk Factors: Alcohol Use: 0 (06/22/2009) Caffeine Use: 2 cups/morning (06/22/2009) Exercise: no (06/22/2009)  Risk Factors: Smoking Status: current (06/22/2009) Packs/Day: 4 cigs/day (06/22/2009)  Review of Systems Resp:  Complains of chest discomfort and shortness of breath; denies cough.  Physical Exam  General:  Well-developed,well-nourished,in no acute distress; alert,appropriate and cooperative throughout examination Head:  Normocephalic and atraumatic without obvious  abnormalities. No apparent alopecia or balding. Lungs:  Normal respiratory effort, chest expands symmetrically. Mild inspiratory wheezing at the apices bilaterally. Heart:  Normal rate and regular rhythm. S1 and S2 normal without gallop, murmur, click, rub or other extra sounds. Abdomen:  Bowel sounds positive,abdomen soft and non-tender without masses, organomegaly or hernias noted. Neurologic:  alert & oriented X3.     Impression & Recommendations:  Problem # 1:  CHEST PAIN (ICD-786.50) Pt's symptoms of congestion, DOE, and wheezing worst at night is most c/w asthma. Pt reports she had exercise-induced asthma as a child. I prescribed an albuterol inhaler as needed. If this provides her benefit but she is still having symptoms, consider adding inhaled steroid to her med regimen as well as PFTs at her next visit.    Problem # 2:  GERD (ICD-530.81) Pt has a "sensitive stomach" and with her probable asthma being worst at night, I prescribed omeprazole for what is likely GERD. Hopefully this will help her asthma symptoms as well.  Her updated medication list for this problem includes:    Omeprazole 20 Mg Cpdr (Omeprazole) .Marland Kitchen... 1 tab daily  Problem # 3:  DEPRESSION (ICD-311) Pt's depression has improved with her daughter now living with her, but she is still depressed. Celexa did not help and only made her paranoid. Will try Prozac. I told her to call the clinic or get help immediately if she experiences any disturbing or unwanted thoughts.  The following medications were removed from the medication list:    Celexa 10 Mg Tabs (Citalopram hydrobromide) .Marland Kitchen... 1 tab daily Her updated medication list for this problem includes:    Klonopin 0.5 Mg Tabs (Clonazepam) .Marland Kitchen... 1 tab daily as needed    Prozac 20 Mg Caps (Fluoxetine hcl) .Marland Kitchen... 1 tab daily  Problem # 4:  TOBACCO ABUSE (ICD-305.1) Pt told me she has not smoked a cigarette in 1 week because her current symptoms make her feel ill when she  smokes. She would like to continue to be off cigarettes. I told her she's doing a great job and to keep it up. Will reevaluate progress at her next visit.  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 This happens periodically. I will refer her to physical therapy, to which she is amenable.  Problem # 6:  SPECIAL SCREENING FOR MALIGNANT NEOPLASMS COLON (ICD-V76.51) Hemoccult cards given as pt does not have insurance, so colonoscopy may not be possible at this point.  Complete Medication List: 1)  Gabapentin 100 Mg Caps (Gabapentin) .Marland Kitchen.. 1 tab three times daily 2)  Ambien 10 Mg Tabs (Zolpidem tartrate) .Marland Kitchen.. 1 tab at bedtime as needed 3)  Klonopin 0.5 Mg Tabs (Clonazepam) .Marland Kitchen.. 1 tab daily as needed 4)  Premarin 0.625 Mg Tabs (Estrogens conjugated) .Marland Kitchen.. 1 tab  daily 5)  Prozac 20 Mg Caps (Fluoxetine hcl) .Marland Kitchen.. 1 tab daily 6)  Omeprazole 20 Mg Cpdr (Omeprazole) .Marland Kitchen.. 1 tab daily 7)  Proair Hfa 108 (90 Base) Mcg/act Aers (Albuterol sulfate) .... 2 puffs every 4-6 hrs as needed for shortness of breath  Other Orders: T-Hemoccult Card-Multiple (take home) ([address]) Physical Therapy Referral (PT)  Patient Instructions: 1)  Please schedule a follow-up appointment in 1 month. 2)  We have changed your medication from Celexa to Prozac. Please call the clnic ASAP if you have any thoughts of hurting yourself as a side effect. 3)  We have prescribed an albuterol inhaler for you to use every 4-6 hours as needed for shortness of breath. Please let us know if you have any problems with it. 4)  We also have given you hemocccult cards as a means of colon cancer screening. Please follow the directions. Prescriptions: PROAIR HFA 108 (90 BASE) MCG/ACT AERS (ALBUTEROL SULFATE) 2 puffs every 4-6 hrs as needed for shortness of breath  #1 mo supply x 2   Entered and Authorized by:   Silvestre Gunner MD   Signed by:   Silvestre Gunner MD on 06/22/2009    Method used:   Print then Give to Patient   RxID:   [id number] OMEPRAZOLE 20 MG CPDR (OMEPRAZOLE) 1 tab daily  #30 x 2   Entered and Authorized by:   Silvestre Gunner MD   Signed by:   Silvestre Gunner MD on 06/22/2009   Method used:   Print then Give to Patient   RxID:   [id number] PROZAC 20 MG CAPS (FLUOXETINE HCL) 1 tab daily  #30 x 2   Entered and Authorized by:   Silvestre Gunner MD   Signed by:   Silvestre Gunner MD on 06/22/2009   Method used:   Print then Give to Patient   RxID:   [id number] KLONOPIN 0.5 MG TABS (CLONAZEPAM) 1 tab daily as needed  #30 x 2   Entered and Authorized by:   Silvestre Gunner MD   Signed by:   Silvestre Gunner MD on 06/22/2009   Method used:   Print then Give to Patient   RxID:   [id number] AMBIEN 10 MG TABS (ZOLPIDEM TARTRATE) 1 tab at bedtime as needed  #30 x 2   Entered and Authorized by:   Silvestre Gunner MD   Signed by:   Silvestre Gunner MD on 06/22/2009   Method used:   Print then Give to Patient   RxID:   [id number]   Prevention & Chronic Care Immunizations   Influenza vaccine: Not documented    Tetanus booster: Not documented    Pneumococcal vaccine: Not documented  Colorectal Screening   Hemoccult: Not documented   Hemoccult action/deferral: Ordered  (06/22/2009)    Colonoscopy: Not documented  Other Screening   Pap smear: Not documented    Mammogram: ASSESSMENT: Negative - BI-RADS 1^MS DIGITAL SCREENING W/ IMPLANTS  (06/09/2009)   Mammogram action/deferral: Ordered  (05/25/2009)   Smoking status: current  (06/22/2009)   Smoking cessation counseling: yes  (06/22/2009)  Lipids   Total Cholesterol: 228  (05/25/2009)   LDL: 122  (05/25/2009)   LDL Direct: Not documented   HDL: 61  (05/25/2009)   Triglycerides: 223  (05/25/2009)   Nursing Instructions: Provide Hemoccult cards with instructions (see order)

## 2010-10-10 NOTE — Progress Notes (Signed)
Summary: refill/ hla  Phone Note Refill Request Message from:  Fax from Pharmacy on Feb 01, 2010 11:15 AM  Refills Requested: Medication #1:  KLONOPIN 0.5 MG TABS 1 tab daily as needed   Dosage confirmed as above?Dosage Confirmed   Last Refilled: 4/28 last visit 3/29 last labs 05/2009  Initial call taken by: Marin Roberts RN,  Feb 01, 2010 11:15 AM  Follow-up for Phone Call        Rx written Follow-up by: Acey Lav MD,  Feb 03, 2010 4:29 PM    Prescriptions: Scarlette Calico 0.5 MG TABS (CLONAZEPAM) 1 tab daily as needed  #30 x 4   Entered and Authorized by:   Acey Lav MD   Signed by:   Paulette Blanch Dam MD on 02/03/2010   Method used:   Telephoned to ...       [facility] Department (retail)       [address] [place], [place]  [address]       Ph: [phone]       Fax: [phone]   RxID:   [id number]

## 2010-10-10 NOTE — Assessment & Plan Note (Signed)
Summary: Social Work  Referred by PCP for multiple health, MH and financial issues.   Met with patient today in my office for assessment, counseling and planning.  60 minutes.   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 She made a good living doing this work but never had health insurance.   She lives in a mobile home in [place].  She no longer has a car for transportation.   In 2002 she broke her collarbone and in 2006 she was trampled by a horse and in the trauma unit for 2 weeks.  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  Tendon issue with her left ring finger.    Carpal tunnel syndrome.  Degenerative disc with spurs.  Right leg sciatica.  She reports she is in constant and chronic pain not being able to sit or stand or use her hands which is essential for her job.   She has difficulties sleeping but also getting up in the morning.   The patient tells me she fell using the stairs on her deck on Friday injuring her tailbone.  Patient does not have hx of drug or alcohol use.   Family support is her 65 year old mother who helps her pay her $300 rent each month as well as medicine copays. Two adult dgtrs also live in the area.  The older dgtr has helped her with transportation today and works second shift.  A sister is in the area but she works quite a bit.   The patient is not connected to a Church at this time.   Disability is in appeals.  Candace Apple is her attny.  Patient participated in [facility] and they were not helpful in getting her anything that she needed in terms of Medicaid, physical therapy or surgery.   A/P:    1)  A cane was obtained from [facility] today due to patient right leg weakness and fall risk.   2)  $30 was given to the patient today so she could  obtain meds at the [facility]  3)  Free balance and fall prevention information was given to her so she could explore [facility] for physical therapy.  I've advised her that they have financial counseling and assistance there.   4)  We will write a letter on her behalf in support of Disability/Medicaid.   5)  I'm not sure if the patient is going to be able to get Celexa.  I'll route this note to Dr. Tobie Lords and ask her if Paxil or Prozac would work since it is on the [employer].        Appended Document: Social Work [employer] from MAP said they have access to Celexa under patient assistance.   Appended Document: Social Work [employer], Does your addendum mean that you don't want me to change to prozac or paxil? I'm happy to do either one, but if she can get access under MAP that would be fine to keep her on it. Let me know if you'd like me to change the prescription.   Thanks! -Ronnald Collum

## 2010-10-10 NOTE — Consult Note (Signed)
Summary: [facility]  [facility]   Imported By: Florinda Marker 06/09/2009 14:38:10  _____________________________________________________________________  External Attachment:    Type:   Image     Comment:   External Document

## 2010-10-10 NOTE — Miscellaneous (Signed)
Summary: [facility].  [facility]   Imported By: Florinda Marker 06/06/2009 15:12:03  _____________________________________________________________________  External Attachment:    Type:   Image     Comment:   External Document

## 2010-10-10 NOTE — Progress Notes (Signed)
Summary: gabapentin/ hla  Phone Note Refill Request Message from:  Fax from Pharmacy on January 05, 2010 4:39 PM  Refills Requested: Medication #1:  GABAPENTIN 100 MG CAPS 1 tab three times daily  Medication #2:  AMBIEN 10 MG TABS 1 tab at bedtime as needed   Last Refilled: 3/24 [facility] only has 300mg  tabs  Initial call taken by: Marin Roberts RN,  January 05, 2010 4:41 PM  Follow-up for Phone Call        That's fine. She can take one 300 mg tablet three times daily. Please call in the refill if you get a chance, as I was unable to fax it since Ambien is considered a controlled substance. Thanks! Follow-up by: Silvestre Gunner MD,  January 07, 2010 1:36 PM    New/Updated Medications: GABAPENTIN 300 MG CAPS (GABAPENTIN) 1 tablet three times daily Prescriptions: AMBIEN 10 MG TABS (ZOLPIDEM TARTRATE) 1 tab at bedtime as needed  #30 x 5   Entered and Authorized by:   Silvestre Gunner MD   Signed by:   Silvestre Gunner MD on 01/07/2010   Method used:   Telephoned to ...       [facility] Department (retail)       [address] [place], [place]  [address]       Ph: [phone]       Fax: [phone]   RxID:   [id number] GABAPENTIN 300 MG CAPS (GABAPENTIN) 1 tablet three times daily  #90 x 5   Entered and Authorized by:   Silvestre Gunner MD   Signed by:   Silvestre Gunner MD on 01/07/2010   Method used:   Telephoned to ...       [facility] Department (retail)       [address] [place], [place]  [address]       Ph: [phone]       Fax: [phone]   RxID:   [id number]   Appended Document: gabapentin/ hla Above Rx refill requests faxed to [facility] pharmacy.

## 2010-10-10 NOTE — Progress Notes (Signed)
Summary: ultrasound order/kg  ---- Converted from flag ---- ---- 06/22/2010 4:38 PM, Rosana Berger MD wrote: Lorel Monaco, Can you please schedule an abdominal ultrasound for Mrs. Concepcion Living to evaluate for fatty liver?  Dx: elevated transaminase and Hep A antibodies She will call you tomorrow to get the appointment. thanks, Dr. Anselm Jungling ------------------------------  Phone Note Outgoing Call   Call placed by: Cynda F[facility]),  June 26, 2010 4:43 PM Call placed to: Patient Summary of Call: Pt contacted by phone, appt scheduled for 10/18 at 8:30am here at [facility], pt to be npo after midnight.   Follow-up for Phone Call        Order placed in EMR, will forward to MD for signature.

## 2010-10-10 NOTE — Assessment & Plan Note (Signed)
Summary: EST-CK/FU/MEDS/CFB   Vital Signs:  Patient profile:   55 year old female Height:      66 inches (167.64 cm) Weight:      115.3 pounds (50.32 kg) BMI:     17.93 Temp:     97.0 degrees F (36.11 degrees C) oral Pulse rate:   68 / minute BP sitting:   148 / 82  (left arm) Cuff size:   regular  Vitals Entered By: Cynda Familia Duncan Dull) (August 14, 2010 1:50 PM) CC: pt c/o right foot pain s/p fall and broken bone about 2wks ago-unable to f/u with orthopedic 2/2 no insurance, Depression, requests something for pain-perocet helped with pain Is Patient Diabetic? No Pain Assessment Patient in pain? yes     Location: right foot Intensity: 10 Type: sharp Onset of pain  Constant s/p fall and broken bone 2 wks ago Nutritional Status BMI of < 19 = underweight  Have you ever been in a relationship where you felt threatened, hurt or afraid?No   Does patient need assistance? Functional Status Self care Ambulation Normal   Primary Care Provider:  Rosana Berger MD  CC:  pt c/o right foot pain s/p fall and broken bone about 2wks ago-unable to f/u with orthopedic 2/2 no insurance, Depression, and requests something for pain-perocet helped with pain.  History of Present Illness: 55 yo is here for hospital follow up.  She fell down the stairs and broke her right foot on Nov 6th, 2011.  She was seen in the ER and was then sent to [facility] for follow up.  She saw the orthopedist once and could not go back because she does not have insurance and cannot afford to pay for office visit fee.   She has been taking 2 Percocet every 4-5 hours for pain and been wearing the hard-boot on and off since she said it is too heavy.   No other complaints.    Depression History:      The patient is having a depressed mood most of the day and has a diminished interest in her usual daily activities.        The patient denies that she has thought about ending her life.        Comments:  depressed  about not being able to afford medical bills.   Preventive Screening-Counseling & Management  Alcohol-Tobacco     Alcohol drinks/day: 0     Smoking Status: quit     Smoking Cessation Counseling: yes     Packs/Day: 4 cigs/day     Year Started: started 30 yrs ago     Year Quit: 2010  October  Allergies: No Known Drug Allergies  Review of Systems       loss 11 lbs and trying to gain weight.    Physical Exam  General:  alert, well-developed, well-nourished, and well-hydrated.   Lungs:  normal respiratory effort, no intercostal retractions, no accessory muscle use, normal breath sounds, no crackles, and no wheezes.   Heart:  normal rate, regular rhythm, no murmur, no gallop, and no rub.   Abdomen:  soft, non-tender, normal bowel sounds, no distention, and no masses.   Extremities:  s/p Right foot fracture 53 wks- fifth metatarsal wearing hardboot for stabilization.  No erythema, drainage, or sign of compartment syndrome.   Neurologic:  alert & oriented X3, cranial nerves II-XII intact, and strength normal in all extremities.     Impression & Recommendations:  Problem # 1:  FRACTURE, FOOT (  ICD-825.20) Recently broke her right foot on 07/16/2010, Xray showed oblique comminuted fifth metatarsal fracture.  Patient was seen in the ER and was sent to [facility] orthopedics.  She was given Percocet for pain and a hard-boot to stablize the fracture.  She is still complaining of pain on right foot and states that she cannot be seen by orthopedist because she does not have insurance.  I think she does not need to be seen by ortho if the repeat foot Xray does not show any further displacement and that her fracture is healing well.   1-Wil get Xray of right foot- result showed mildly displaced.  Will encourage her to stabilize her right foot as much as she can to prevent further displacement while it is healing 2-Will give Percocet 5/500mg  for pain control #20, I explained to patient that this is not a  longterm solution and that her fracture should take about 6 weeks to heal. 3-Will follow up in 1 week  Orders: Radiology other (Radiology Other)  Problem # 2:  TRANSAMINASES, SERUM, ELEVATED (ICD-790.4) Bloodwork done on 06/15/2010 showed reactive Hep C antibody.  Will need confirmation test because she can be a chronic carrier or she could have just cleared the infection, or actively making viral loads. I will order confirmatory test of HCV today: HCV qualitative and PCR (test # 83130).  Patient is currently asymptomatic. I will follow up with her in 1 wk Orders: T- * Misc. Laboratory test [phone]) T-Hepatitis C Viral Load [phone])  Problem # 3:  INSOMNIA (ICD-780.52) Will refill her Ambien 10mg  at bedtime today. She has problem sleeping at night especially with her foot pain.  I will try to wean her off in the future.  Her updated medication list for this problem includes:    Ambien 10 Mg Tabs (Zolpidem tartrate) .Marland Kitchen... 1 tab at bedtime as needed  Complete Medication List: 1)  Gabapentin 300 Mg Caps (Gabapentin) .Marland Kitchen.. 1 tablet three times daily 2)  Ambien 10 Mg Tabs (Zolpidem tartrate) .Marland Kitchen.. 1 tab at bedtime as needed 3)  Klonopin 0.5 Mg Tabs (Clonazepam) .Marland Kitchen.. 1 tab daily as needed 4)  Premarin 0.625 Mg Tabs (Estrogens conjugated) .Marland Kitchen.. 1 tab daily 5)  Omeprazole 20 Mg Cpdr (Omeprazole) .Marland Kitchen.. 1 tab daily 6)  Percocet 5-325 Mg Tabs (Oxycodone-acetaminophen) .... Take 1 tablet by mouth every 4-6 hours as needed for pain  Patient Instructions: 1)  Will get Xray of your right foot  2)  Please take Percocet for pain as needed, you will not need to be on narcotics longterm because your fracture will take about 6 weeks to heal 3)  Follow up with Dr. Anselm Jungling in 1 week Prescriptions: AMBIEN 10 MG TABS (ZOLPIDEM TARTRATE) 1 tab at bedtime as needed  #30 x 3   Entered and Authorized by:   Rosana Berger MD   Signed by:   Rosana Berger MD on 08/14/2010   Method used:   Print then Give to Patient    RxID:   [id number] PERCOCET 5-325 MG TABS (OXYCODONE-ACETAMINOPHEN) take 1 tablet by mouth every 4-6 hours as needed for pain  #20 x 0   Entered and Authorized by:   Rosana Berger MD   Signed by:   Rosana Berger MD on 08/14/2010   Method used:   Print then Give to Patient   RxID:   [id number]    Orders Added: 1)  T- * Misc. Laboratory test [phone] 2)  Radiology other [Radiology Other] 3)  Est. Patient Level  III [id number] 4)  T-Hepatitis C Viral Load [phone]   Process Orders Check Orders Results:     [facility]: ABN not required for this insurance Order queued for requisitioning for [facility]: August 14, 2010 4:16 PM Tests Sent for requisitioning (August 14, 2010 5:30 PM):     08/14/2010: [facility] -- T- * Misc. Laboratory test [[id number]] (signed)     08/14/2010: [facility] -- T-Hepatitis C Viral Load [phone] (signed)     Prevention & Chronic Care Immunizations   Influenza vaccine: Not documented   Influenza vaccine deferral: Deferred  (10/20/2009)    Tetanus booster: Not documented   Td booster deferral: Deferred  (10/20/2009)    Pneumococcal vaccine: Not documented  Colorectal Screening   Hemoccult: Not documented   Hemoccult action/deferral: Ordered  (06/22/2009)    Colonoscopy: Not documented   Colonoscopy action/deferral: Refused  (10/20/2009)  Other Screening   Pap smear: Not documented   Pap smear due: 05/11/2010    Mammogram: ASSESSMENT: Negative - BI-RADS 1^MM DIGITAL W/ IMPLANTS SCREENING  (06/14/2010)   Mammogram action/deferral: Ordered  (05/25/2009)   Smoking status: quit  (08/14/2010)  Lipids   Total Cholesterol: 228  (05/25/2009)   LDL: 122  (05/25/2009)   LDL Direct: Not documented   HDL: 61  (05/25/2009)   Triglycerides: 223  (05/25/2009)   Process Orders Check Orders Results:     [facility]: ABN not required for this insurance Order queued for  requisitioning for [facility]: August 14, 2010 4:16 PM Tests Sent for requisitioning (August 14, 2010 5:30 PM):     08/14/2010: [facility] -- T- * Misc. Laboratory test [id number] (signed)     08/14/2010: [facility] -- T-Hepatitis C Viral Load [phone] (signed)     Appended Document: EST-CK/FU/MEDS/CFB I discussed the patient with Dr. Anselm Jungling and I agree with the assessment and plan as outlined above. I am surprised that the pt is strill requiring sig pain that requires narcs. Agree with Dr Christie Nottingham plan as above.

## 2010-10-10 NOTE — Miscellaneous (Signed)
[facility]   Imported By: Margie Billet 12/07/2009 11:38:45  _____________________________________________________________________  External Attachment:    Type:   Image     Comment:   External Document

## 2010-10-10 NOTE — Progress Notes (Signed)
Summary: REFILL/ [facility]  Phone Note Refill Request Message from:  Fax from Pharmacy on October 12, 2009 12:26 PM  Refills Requested: Medication #1:  PREMARIN 0.625 MG TABS 1 tab daily   Last Refilled: 1/3 Can pt more than 1 refill at a time   Method Requested: Electronic Initial call taken by: Marin Roberts RN,  October 12, 2009 12:27 PM  Follow-up for Phone Call        Refilled electronically.  Follow-up by: Margarito Liner MD,  October 13, 2009 11:13 AM    Prescriptions: PREMARIN 0.625 MG TABS (ESTROGENS CONJUGATED) 1 tab daily  #30 x 0   Entered and Authorized by:   Margarito Liner MD   Signed by:   Margarito Liner MD on 10/13/2009   Method used:   Electronically to        [facility]* (retail)       [address]       [place], [place]  [address]       Ph: [phone]       Fax: [phone]   RxID:   [id number]

## 2010-10-10 NOTE — Letter (Signed)
Summary: [facility]  [facility]   Imported By: Margie Billet 12/07/2009 12:09:15  _____________________________________________________________________  External Attachment:    Type:   Image     Comment:   External Document  Appended Document: [facility] Interestingly, MRI of lumbar spine is normal. [facility] was under the impression that she had not had an MRI of the lumbar spine but instead of the thoraco-cervical spine. I have already referred her to the [facility], and they should hopefully handle her pain treatments.

## 2010-10-10 NOTE — Assessment & Plan Note (Signed)
Summary: cough, "olivegreenmucous", <appetite,chills,chest congestion,...   Vital Signs:  Patient profile:   55 year old female Height:      66 inches Weight:      117.2 pounds BMI:     18.98 O2 Sat:      96 % Temp:     97.2 degrees F oral Pulse rate:   75 / minute BP sitting:   125 / 71  (right arm)  Vitals Entered By: Filomena Jungling NT II (October 20, 2009 2:26 PM) CC: SORE THROAT, NAUSEA AND DIARREAH, CHEST CONJESTION COUGH BACKPAIN, COOUGHING UP GREEN , ACHING, Depression Is Patient Diabetic? No Pain Assessment Patient in pain? no      Nutritional Status BMI of < 19 = underweight  Does patient need assistance? Functional Status Self care Ambulation Normal   Primary Care Provider:  Silvestre Gunner MD  CC:  SORE THROAT, NAUSEA AND DIARREAH, CHEST CONJESTION COUGH BACKPAIN, COOUGHING UP GREEN , ACHING, and Depression.  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 NO SOB, CP, rashes, sick contacts.    Depression History:      The patient denies a depressed mood most of the day and a diminished interest in her usual daily activities.         Preventive Screening-Counseling & Management  Alcohol-Tobacco     Alcohol drinks/day: 0     Smoking Status: quit     Smoking Cessation Counseling: yes     Packs/Day: 4 cigs/day     Year Started: started 30 yrs ago     Year Quit: 2010  October  Caffeine-Diet-Exercise     Caffeine use/day: 2 cups/morning     Does Patient Exercise: no  Current Medications (verified): 1)  Gabapentin 100 Mg Caps (Gabapentin) .Marland Kitchen.. 1 Tab Three Times Daily 2)  Ambien 10 Mg Tabs (Zolpidem Tartrate) .Marland Kitchen.. 1 Tab At Bedtime As Needed 3)  Klonopin 0.5 Mg Tabs (Clonazepam) .Marland Kitchen.. 1 Tab Daily As Needed 4)  Premarin 0.625 Mg Tabs (Estrogens Conjugated) .Marland Kitchen.. 1 Tab Daily 5)  Prozac 20  Mg Caps (Fluoxetine Hcl) .Marland Kitchen.. 1 Tab Daily 6)  Omeprazole 20 Mg Cpdr (Omeprazole) .Marland Kitchen.. 1 Tab Daily 7)  Proair Hfa 108 (90 Base) Mcg/act Aers (Albuterol Sulfate) .... 2 Puffs Every 4-6 Hrs As Needed For Shortness of Breath 8)  Tussin Cough 15 Mg/96ml Syrp (Dextromethorphan Hbr) .... Two Times A Day 9)  Azithromycin 250 Mg Tabs (Azithromycin) .... 2 Tabs Today, Then 1 Tab A Day For Next 3 Days  Allergies (verified): No Known Drug Allergies  Review of Systems       The patient complains of anorexia, fever, hoarseness, and prolonged cough.  The patient denies weight loss, weight gain, vision loss, decreased hearing, chest pain, syncope, dyspnea on exertion, peripheral edema, headaches, hemoptysis, abdominal pain, melena, hematochezia, severe indigestion/heartburn, hematuria, incontinence, genital sores, muscle weakness, suspicious skin lesions, transient blindness, difficulty walking, depression, unusual weight change, abnormal bleeding, enlarged lymph nodes, angioedema, breast masses, and testicular masses.    Physical Exam  General:  Well-developed,well-nourished,in no acute distress; alert,appropriate and cooperative throughout examination Head:  normocephalic and atraumatic.   Eyes:  vision grossly intact, pupils equal, pupils round, and pupils reactive to light.   Ears:  R ear normal and L ear normal.   Nose:  no external deformity.   Mouth:  good dentition.   Neck:  supple, full ROM, and no cervical lymphadenopathy.   Lungs:  normal respiratory effort, no intercostal retractions, no accessory muscle use, normal breath sounds, no dullness, and no wheezes.   Heart:  normal rate, regular rhythm, no murmur, and no JVD.   Abdomen:  soft, non-tender, normal bowel sounds, and no distention.   Msk:  normal ROM, no joint swelling, and no joint warmth.   Pulses:  dorsalis pedis pulses normal bilaterally  Extremities:  no edema Neurologic:  OrientedX3, cranial nerver 2-12 intact,strength good in all  extremities, sensations normal to light touch, reflexes 2+ b/l, gait normal    Impression & Recommendations:  Problem # 1:  UPPER RESPIRATORY INFECTION, ACUTE (ICD-465.9) 1 week history sinus tenderness, high fevers. Wil treat with z-pack for now. Will ask her to come to come abck in a week if no improvement. May need xra or sinus film if no improvement. Lung exam is clear at this time.   Her updated medication list for this problem includes:    Tussin Cough 15 Mg/75ml Syrp (Dextromethorphan hbr) .Marland Kitchen..Marland Kitchen Two times a day  Instructed on symptomatic treatment. Call if symptoms persist or worsen.   Problem # 2:  DEPRESSION (ICD-311) Stable. Need Klonipin refill  Her updated medication list for this problem includes:    Klonopin 0.5 Mg Tabs (Clonazepam) .Marland Kitchen... 1 tab daily as needed    Prozac 20 Mg Caps (Fluoxetine hcl) .Marland Kitchen... 1 tab daily  Complete Medication List: 1)  Gabapentin 100 Mg Caps (Gabapentin) .Marland Kitchen.. 1 tab three times daily 2)  Ambien 10 Mg Tabs (Zolpidem tartrate) .Marland Kitchen.. 1 tab at bedtime as needed 3)  Klonopin 0.5 Mg Tabs (Clonazepam) .Marland Kitchen.. 1 tab daily as needed 4)  Premarin 0.625 Mg Tabs (Estrogens conjugated) .Marland Kitchen.. 1 tab daily 5)  Prozac 20 Mg Caps (Fluoxetine hcl) .Marland Kitchen.. 1 tab daily 6)  Omeprazole 20 Mg Cpdr (Omeprazole) .Marland Kitchen.. 1 tab daily 7)  Proair Hfa 108 (90 Base) Mcg/act Aers (Albuterol sulfate) .... 2 puffs every 4-6 hrs as needed for shortness of breath 8)  Tussin Cough 15 Mg/39ml Syrp (Dextromethorphan hbr) .... Two times a day 9)  Azithromycin 250 Mg Tabs (Azithromycin) .... 2 tabs today, then 1 tab a day for next 3 days  Patient Instructions: 1)  Please schedule a follow-up appointment in 1 month. Come early if you are feeling worse. 2)  Get plenty of rest, drink lots of clear liquids, and use Tylenol or Ibuprofen for fever and comfort. Return in 7-10 days if you're not better:sooner if you're feeling worse. 3)  Take 650-1000mg  of Tylenol every 4-6 hours as needed for relief of  pain or comfort of fever AVOID taking more than 4000mg   in a 24 hour period (can cause liver damage in higher doses). Prescriptions: KLONOPIN 0.5 MG TABS (CLONAZEPAM) 1 tab daily as needed  #30 x 2   Entered and Authorized by:   Bethel Born MD   Signed by:   Bethel Born MD on 10/20/2009   Method used:   Printed then faxed to ...       [facility] (retail)       [address] [place], [place]  [address]       Ph: [phone]       Fax: [phone]   RxID:   [id number] TUSSIN COUGH 15 MG/5ML SYRP (DEXTROMETHORPHAN HBR) two times a day  #1 x 0   Entered and Authorized  by:   Bethel Born MD   Signed by:   Bethel Born MD on 10/20/2009   Method used:   Print then Give to Patient   RxID:   [id number] AZITHROMYCIN 250 MG TABS (AZITHROMYCIN) 2 tabs today, then 1 tab a day for next 3 days  #5 x 0   Entered and Authorized by:   Bethel Born MD   Signed by:   Bethel Born MD on 10/20/2009   Method used:   Print then Give to Patient   RxID:   [id number]     Prevention & Chronic Care Immunizations   Influenza vaccine: Not documented   Influenza vaccine deferral: Deferred  (10/20/2009)    Tetanus booster: Not documented   Td booster deferral: Deferred  (10/20/2009)    Pneumococcal vaccine: Not documented  Colorectal Screening   Hemoccult: Not documented   Hemoccult action/deferral: Ordered  (06/22/2009)    Colonoscopy: Not documented   Colonoscopy action/deferral: Refused  (10/20/2009)  Other Screening   Pap smear: Not documented   Pap smear due: 05/11/2010    Mammogram: ASSESSMENT: Negative - BI-RADS 1^MS DIGITAL SCREENING W/ IMPLANTS  (06/09/2009)   Mammogram action/deferral: Ordered  (05/25/2009)   Smoking status: quit  (10/20/2009)  Lipids   Total Cholesterol: 228  (05/25/2009)   LDL: 122  (05/25/2009)   LDL Direct: Not documented   HDL: 61  (05/25/2009)   Triglycerides: 223  (05/25/2009)

## 2010-10-10 NOTE — Consult Note (Signed)
Summary: [facility]  [facility]   Imported By: Florinda Marker 06/01/2009 14:13:03  _____________________________________________________________________  External Attachment:    Type:   Image     Comment:   External Document

## 2010-10-10 NOTE — Miscellaneous (Signed)
Summary: [facility]  [facility]   Imported By: Florinda Marker 06/06/2009 15:12:54  _____________________________________________________________________  External Attachment:    Type:   Image     Comment:   External Document

## 2010-10-10 NOTE — Progress Notes (Signed)
Summary: Soc. 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      Appended Document: Soc. Work Patient rescheduled appmt to [employer]. Sept 29th at 10:00 AM.

## 2010-10-10 NOTE — Progress Notes (Signed)
Summary: phone/gg  Phone Note Refill Request  on Jan 18, 2010 12:31 PM  Refills Requested: Medication #1:  KLONOPIN 0.5 MG TABS 1 tab daily as needed Pt would like you to call her at [phone].   her refill is 17 days early, she has had to take more that normal.   Method Requested: Telephone to Pharmacy Initial call taken by: Merrie Roof RN,  Jan 18, 2010 12:31 PM  Follow-up for Phone Call        I will try my best to call in the late afternoon. Follow-up by: Silvestre Gunner MD,  Jan 18, 2010 10:26 PM  Additional Follow-up for Phone Call Additional follow up Details #1::        I tried calling Ms. Oliver at the number you gave but there was no answer and the voicemail had someone else's name (perhaps her daughter), so I couldn't leave a message. Please let her know I tried calling, and if she has a specific question about her meds, perhaps she can ask you and I'll see if I can answer it. Otherwise, I can try her again at some point. Thanks! Additional Follow-up by: Silvestre Gunner MD,  Jan 21, 2010 10:31 AM    Additional Follow-up for Phone Call Additional follow up Details #2::    I reached pt at this # and she had wanted an early  refill on klonopin.  She didnot realize she had 1 refill left. THe situation that caused her stress has improved.  She is all set at this time Follow-up by: Merrie Roof RN,  Jan 26, 2010 3:07 PM

## 2010-10-10 NOTE — Assessment & Plan Note (Signed)
Summary: check up [mkj]   Vital Signs:  Patient profile:   55 year old female Height:      66 inches (167.64 cm) Weight:      118.03 pounds (53.65 kg) BMI:     19.12 Temp:     97.4 degrees F (36.33 degrees C) oral Pulse rate:   64 / minute BP sitting:   119 / 73  (right arm)  Vitals Entered By: Angelina Ok RN (August 17, 2009 3:04 PM) CC: Depression Is Patient Diabetic? No Pain Assessment Patient in pain? yes     Location: topof back and shoulders Intensity: 8 Type: sore aching Onset of pain  Constant Nutritional Status BMI of 19 -24 = normal  Have you ever been in a relationship where you felt threatened, hurt or afraid?No   Does patient need assistance? Functional Status Self care Ambulation Normal Comments Back and shoulder pain leaves arm numb.  Went to Urgent Care given apain meds for chest pain.  Med makes her nauseaous and gives her heartburn.  Wants something for.  Coughing green mucous. No fevers.  Wants areferral for her arms and hands.  No feeling in thumb area.   CC:  Depression.  History of Present Illness: Amanda Bennett is a 55 yo F with PMH of chronic pain and depression/anxiety who presents with increased pain in her hands and arms as well as the R side of her chest. She said she developed a cough recently and during a vigorous bout of coughing she developed chest pain that "felt like a broken rib." She went to urgent care who took an x-ray (neg) and gave her a z-pack, which she says has helped her cough a lot. She continues to have the pain in her R side of her chest, and since then her neck muscles have also felt tighter, which she feels may be due to positional changes she's had to make. She also reports increased numbness in her hands and arms bilaterally (throughout both hands, though L thumb is the worst). She would like a referral to a hand specialist in case this is carpal tunnel.  She also has been unable to fill her premarin because the health dept  cannot get it for another 4-6 wks and she feels like her mood and hot flashes have gotten worse without it (though she feels her depression is better with the Prozac).  Depression History:      The patient denies a depressed mood most of the day and a diminished interest in her usual daily activities.         Preventive Screening-Counseling & Management  Alcohol-Tobacco     Alcohol drinks/day: 0     Smoking Status: quit     Smoking Cessation Counseling: yes     Packs/Day: 4 cigs/day     Year Started: started 30 yrs ago     Year Quit: 2010  October  Comments: Stopped 2 months ago  Current Medications (verified): 1)  Gabapentin 100 Mg Caps (Gabapentin) .Marland Kitchen.. 1 Tab Three Times Daily 2)  Ambien 10 Mg Tabs (Zolpidem Tartrate) .Marland Kitchen.. 1 Tab At Bedtime As Needed 3)  Klonopin 0.5 Mg Tabs (Clonazepam) .Marland Kitchen.. 1 Tab Daily As Needed 4)  Premarin 0.625 Mg Tabs (Estrogens Conjugated) .Marland Kitchen.. 1 Tab Daily 5)  Prozac 20 Mg Caps (Fluoxetine Hcl) .Marland Kitchen.. 1 Tab Daily 6)  Omeprazole 20 Mg Cpdr (Omeprazole) .Marland Kitchen.. 1 Tab Daily 7)  Proair Hfa 108 (90 Base) Mcg/act Aers (Albuterol Sulfate) .... 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 Was initially denied disability, now appealing that through an attorney. Denied medicaid. Lives by herself, widowed 2 yrs ago Two adult children, one lives locally. Limited support system.  Current smoker- 1 PPD x 30 yrs, now down to 4 cigs/day Alcohol- none Drugs- none  Risk Factors: Alcohol Use: 0 (08/17/2009) Caffeine Use: 2 cups/morning  (06/22/2009) Exercise: no (06/22/2009)  Risk Factors: Smoking Status: quit (08/17/2009) Packs/Day: 4 cigs/day (08/17/2009)  Social History: Smoking Status:  quit  Review of Systems      See HPI  Physical Exam  General:  Well-developed,well-nourished,in no acute distress; alert,appropriate and cooperative throughout examination Head:  Normocephalic and atraumatic without obvious abnormalities. No apparent alopecia or balding. Chest Wall:  tender along R side of sternum Lungs:  Normal respiratory effort, chest expands symmetrically. Lungs are clear to auscultation, no crackles or wheezes. Heart:  Normal rate and regular rhythm. S1 and S2 normal without gallop, murmur, click, rub or other extra sounds. Msk:  decreased strength in hands bilaterally, tinel's and phalen's positive Neurologic:  alert & oriented X3, decreased sensation in arms and hands bilaterally Psych:  Cognition and judgment appear intact. Alert and cooperative with normal attention span and concentration. No apparent delusions, illusions, hallucinations   Impression & Recommendations:  Problem # 1:  CHEST PAIN (ICD-786.50) Likely MSK origin given history of pain starting with coughing bout and given area is TTP. Diclofenac did not help the pain. Patient will try Ibuprofen.  Problem # 2:  NUMBNESS, HAND (ICD-782.0) Some of pt's symptoms are c/w her h/o cervical disease, but many of her symptoms could be due to carpal-tunnel syndrome, which she was told previously she has. Tinel's and Phalen's tests were positive, c/w c-t syndrome. Will refer her to orthopedic surgery at [facility] given that she does not have insurance.   Orders: Orthopedic Surgeon Referral (Ortho Surgeon)  Problem # 3:  HOT FLASHES (ICD-627.2) Pt has been unable to get her Premarin for her hot flashes/mood due to public health dept not carrying it at this time. They cannot get it for another 4-6 wks, and Premarin at another pharmacy would cost $60  out-of-pocket. We explored several options with the patient, including black cohosh, clonidine, and switching prozac to paxil, but she said the prozac is working and nothing else has helped her besides the Premarin. She said she will try to get the money to pay for it out of pocket until the health dept can carry it.   Her updated medication list for this problem includes:    Premarin 0.625 Mg Tabs (Estrogens conjugated) .Marland Kitchen... 1 tab daily  Complete Medication List: 1)  Gabapentin 100 Mg Caps (Gabapentin) .Marland Kitchen.. 1 tab three times daily 2)  Ambien 10 Mg Tabs (Zolpidem tartrate) .Marland Kitchen.. 1 tab at bedtime as needed 3)  Klonopin 0.5 Mg Tabs (Clonazepam) .Marland Kitchen.. 1 tab daily as needed 4)  Premarin 0.625 Mg Tabs (Estrogens conjugated) .Marland Kitchen.. 1 tab daily 5)  Prozac 20 Mg Caps (Fluoxetine hcl) .Marland Kitchen.. 1 tab daily 6)  Omeprazole 20 Mg Cpdr (Omeprazole) .Marland Kitchen.. 1 tab daily 7)  Proair Hfa  108 (90 Base) Mcg/act Aers (Albuterol sulfate) .... 2 puffs every 4-6 hrs as needed for shortness of breath  Patient Instructions: 1)  Please schedule a follow-up appointment as needed. 2)  I have sent a referral for you to see a hand specialist. They will call you with an appointment.  3)  For your chest pain, which is likely a muscle strain, take Ibuprofen 2 tablets every 6 hours as needed and try applying a heating pad to the area as needed.  4)  As long as you're taking the Ibuprofen, make sure you are also taking your omeprazole for your stomach.  Prevention & Chronic Care Immunizations   Influenza vaccine: Not documented    Tetanus booster: Not documented    Pneumococcal vaccine: Not documented  Colorectal Screening   Hemoccult: Not documented   Hemoccult action/deferral: Ordered  (06/22/2009)    Colonoscopy: Not documented  Other Screening   Pap smear: Not documented    Mammogram: ASSESSMENT: Negative - BI-RADS 1^MS DIGITAL SCREENING W/ IMPLANTS  (06/09/2009)   Mammogram action/deferral: Ordered   (05/25/2009)   Smoking status: quit  (08/17/2009)  Lipids   Total Cholesterol: 228  (05/25/2009)   LDL: 122  (05/25/2009)   LDL Direct: Not documented   HDL: 61  (05/25/2009)   Triglycerides: 223  (05/25/2009)     Vital Signs:  Patient profile:   55 year old female Height:      66 inches (167.64 cm) Weight:      118.03 pounds (53.65 kg) BMI:     19.12 Temp:     97.4 degrees F (36.33 degrees C) oral Pulse rate:   64 / minute BP sitting:   119 / 73  (right arm)  Vitals Entered By: Angelina Ok RN (August 17, 2009 3:04 PM)

## 2010-10-10 NOTE — Miscellaneous (Signed)
Summary: Initial Summary For PT Services  Initial Summary For PT Services   Imported By: Florinda Marker 07/27/2009 14:16:38  _____________________________________________________________________  External Attachment:    Type:   Image     Comment:   External Document

## 2010-10-10 NOTE — Progress Notes (Signed)
Summary: refill/gg  Phone Note Refill Request  on June 20, 2010 3:34 PM  Refills Requested: Medication #1:  PREMARIN 0.625 MG TABS 1 tab daily   Last Refilled: 06/08/2010  Method Requested: Electronic Initial call taken by: Merrie Roof RN,  June 20, 2010 3:35 PM    Prescriptions: PREMARIN 0.625 MG TABS (ESTROGENS CONJUGATED) 1 tab daily  #30 x 6   Entered and Authorized by:   Zoila Shutter MD   Signed by:   Zoila Shutter MD on 06/20/2010   Method used:   Electronically to        [facility]* (retail)       [address]       [place], [place]  [address]       Ph: [phone]       Fax: [phone]   RxID:   [id number]

## 2010-10-10 NOTE — Progress Notes (Signed)
Summary: Refill/gh  Phone Note Refill Request Message from:  Fax from Pharmacy on November 14, 2009 4:40 PM  Refills Requested: Medication #1:  PREMARIN 0.625 MG TABS 1 tab daily   Last Refilled: 10/13/2009  Method Requested: Electronic Initial call taken by: Angelina Ok RN,  November 14, 2009 4:41 PM    Prescriptions: PREMARIN 0.625 MG TABS (ESTROGENS CONJUGATED) 1 tab daily  #30 x 6   Entered and Authorized by:   Silvestre Gunner MD   Signed by:   Silvestre Gunner MD on 11/14/2009   Method used:   Electronically to        [facility]* (retail)       [address]       [place], [place]  [address]       Ph: [phone]       Fax: [phone]   RxID:   [id number]

## 2010-10-10 NOTE — Assessment & Plan Note (Signed)
Summary: EST-CK/FU/MEDS   Vital Signs:  Patient profile:   55 year old female Height:      66 inches (167.64 cm) Weight:      110.7 pounds (50.32 kg) BMI:     17.93 Temp:     98.2 degrees F (36.78 degrees C) oral Pulse rate:   73 / minute BP sitting:   126 / 70  (left arm) Cuff size:   small  Vitals Entered By: Cynda Familia Duncan Dull) (June 06, 2010 3:58 PM) CC: vison changes over the last , getting worse, wants to be checked for diabetes because of her "non-healing" wounds, , Depression Is Patient Diabetic? No Pain Assessment Patient in pain? yes     Location: hands/lower back Nutritional Status BMI of < 19 = underweight  Does patient need assistance? Functional Status Self care Ambulation Normal   Primary Care Provider:  Rosana Berger MD  CC:  vison changes over the last , getting worse, wants to be checked for diabetes because of her "non-healing" wounds, , and Depression.  History of Present Illness: 55 yo female with PMH of endometriosis, GERD, back pain with radiculopathy, decrease vision acuity presents today for:  bump her legs-get infected easily in the past 6 months.  She was treated with Doxycycline by urgent care on left leg and resolved but now she has another lesion on her right leg that would not heal.  She has been taking leftover Doxycycline in the past 4 days and it helps a little bit.    decrease in vision, cannot see far or close up. 20/100 both eyes  very thirsty, increase in urination frequency that is waking her up at night, burning sensation and treat with Doxycline and Azo (over the counter) which helps, loosing weight- 10 pounds in the past several months, and DM runs in her father's side  get injection at [facility] for her carpal tunnel syndrome call results to home phone: [phone]    Depression History:      The patient is having a depressed mood most of the day and has a diminished interest in her usual daily activities.         The patient denies that she has thought about ending her life.        Comments:  prozac has helped.   Preventive Screening-Counseling & Management  Alcohol-Tobacco     Alcohol drinks/day: 0     Smoking Status: quit     Smoking Cessation Counseling: yes     Packs/Day: 4 cigs/day     Year Started: started 30 yrs ago     Year Quit: 2010  October  Allergies: No Known Drug Allergies  Review of Systems       The patient complains of vision loss.    Physical Exam  General:  alert, well-developed, well-nourished, and well-hydrated.   Neck:  supple, full ROM, and no masses.   Lungs:  normal respiratory effort, no intercostal retractions, no accessory muscle use, and normal breath sounds.   Heart:  normal rate, regular rhythm, no murmur, no gallop, no rub, and no JVD.   Abdomen:  soft, non-tender, normal bowel sounds, no distention, no masses, and no guarding.   Extremities:  Right anterior tibial indurated circular abrasion with pink granulation tissue and crust, no erythema or drainage 1cm x 1.5cm.   Left lower leg: multiple healed lesions with pink granulation tissues Neurologic:  alert & oriented X3, cranial nerves II-XII intact, and strength normal in all extremities.  Skin:  right forearm- 5mm actinic keratosis  Additional Exam:  OD 20/100 OS 20/100   Impression & Recommendations:  Problem # 1:  HIP THIGH LEG&ANK ABRASION/FRICTION BURN INF (ICD-916.1) Assessment New The hard induration on right anterior tibial could be a fibroma.  The lesion does not look actively infected at this moment since she did get 4 days of Doxycycline.  I will Continue Doxycycline 100mg  two times a day x 10-14 days.  Patient is to follow up in 3-4 weeks to make sure her lesion is resolved, if not resolved, will need to change antibiotics and/or biopsy.  In addition, I will also check the following labs:CBC, CMET. In addition, given patient father had melanoma and her fair skin complexion, and  an actinic keratosis on her right forearm and sun damage, I will refer her to dermatology for complete skin check-up.    Orders: T-Vitamin B12 ([id number])  Problem # 2:  VISUAL ACUITY, DECREASED (ICD-369.9) Assessment: Deteriorated Eye exam 20/100 OD & OS  Will refer to ophthalmology/optometry for further evaluation  Problem # 3:  WEIGHT LOSS, RECENT (UJW-119.14) Assessment: New Concerning for DM, hyperthyroidism.  Will check CMET, TSH, B12 level.   Orders: T-TSH [id number]) T-Vitamin B12 ([id number])  Problem # 4:  TOBACCO ABUSE (ICD-305.1) She states that she is trying to cut back on her tobacco.  I talked about tobacco cessation and will discuss next time when she is ready to quit.  Complete Medication List: 1)  Gabapentin 300 Mg Caps (Gabapentin) .Marland Kitchen.. 1 tablet three times daily 2)  Ambien 10 Mg Tabs (Zolpidem tartrate) .Marland Kitchen.. 1 tab at bedtime as needed 3)  Klonopin 0.5 Mg Tabs (Clonazepam) .Marland Kitchen.. 1 tab daily as needed 4)  Premarin 0.625 Mg Tabs (Estrogens conjugated) .Marland Kitchen.. 1 tab daily 5)  Prozac 20 Mg Caps (Fluoxetine hcl) .Marland Kitchen.. 1 tab daily 6)  Omeprazole 20 Mg Cpdr (Omeprazole) .Marland Kitchen.. 1 tab daily 7)  Proair Hfa 108 (90 Base) Mcg/act Aers (Albuterol sulfate) .... 2 puffs every 4-6 hrs as needed for shortness of breath 8)  Tussin Cough 15 Mg/60ml Syrp (Dextromethorphan hbr) .... Two times a day 9)  Azithromycin 250 Mg Tabs (Azithromycin) .... 2 tabs today, then 1 tab a day for next 3 days 10)  Tylenol With Codeine #3 300-30 Mg Tabs (Acetaminophen-codeine) .... Take 1-2 tabs q6 hrs as needed pain 11)  Doxycycline Hyclate 100 Mg Caps (Doxycycline hyclate) .... Take one tablet twice daily by mouth with food  Other Orders: T-CMP with Estimated GFR ([id number]) T-CBC w/Diff ([id number])  Patient Instructions: 1)  Take Doxycycline 100mg  one tablet twice daily with food for 10-14 days  2)  Will refer to dermatologist 3)  Will refer to ophthalmologist/optometrist 4)  Will check blood  work today 5)  I will call you to inform you about the blood work results 6)  Follow up in 3-4 weeks  Prescriptions: DOXYCYCLINE HYCLATE 100 MG CAPS (DOXYCYCLINE HYCLATE) take one tablet twice daily by mouth with food  #28 x 0   Entered and Authorized by:   Rosana Berger MD   Signed by:   Rosana Berger MD on 06/06/2010   Method used:   Print then Give to Patient   RxID:   [id number] DOXYCYCLINE HYCLATE 100 MG CAPS (DOXYCYCLINE HYCLATE) take one tablet twice daily by mouth with food  #28 x 0   Entered and Authorized by:   Rosana Berger MD   Signed by:   Rosana Berger MD on 06/06/2010   Method used:  Print then Give to Patient   RxID:   [id number]  Process Orders Check Orders Results:     [facility]: ABN not required for this insurance Tests Sent for requisitioning (June 06, 2010 5:29 PM):     06/06/2010: [facility] -- T-CMP with Estimated GFR [[id number]] (signed)     06/06/2010: [facility] -- T-CBC w/Diff [[id number]] (signed)     06/06/2010: [facility] -- T-TSH [phone] (signed)     06/06/2010: [facility] -- T-Vitamin B12 [[id number]] (signed)     Prevention & Chronic Care Immunizations   Influenza vaccine: Not documented   Influenza vaccine deferral: Deferred  (10/20/2009)    Tetanus booster: Not documented   Td booster deferral: Deferred  (10/20/2009)    Pneumococcal vaccine: Not documented  Colorectal Screening   Hemoccult: Not documented   Hemoccult action/deferral: Ordered  (06/22/2009)    Colonoscopy: Not documented   Colonoscopy action/deferral: Refused  (10/20/2009)  Other Screening   Pap smear: Not documented   Pap smear due: 05/11/2010    Mammogram: ASSESSMENT: Negative - BI-RADS 1^MS DIGITAL SCREENING W/ IMPLANTS  (06/09/2009)   Mammogram action/deferral: Ordered  (05/25/2009)   Smoking status: quit  (06/06/2010)  Lipids   Total Cholesterol: 228   (05/25/2009)   LDL: 122  (05/25/2009)   LDL Direct: Not documented   HDL: 61  (05/25/2009)   Triglycerides: 223  (05/25/2009)   Nursing Instructions: Give Flu vaccine today

## 2010-10-10 NOTE — Consult Note (Signed)
Summary: [facility] 2009 to 2005 Notes  [facility] 2009 to 2005 Notes   Imported By: Florinda Marker 06/01/2009 14:50:36  _____________________________________________________________________  External Attachment:    Type:   Image     Comment:   External Document

## 2010-10-11 ENCOUNTER — Ambulatory Visit: Admit: 2010-10-11 | Payer: Self-pay

## 2010-10-11 ENCOUNTER — Ambulatory Visit: Payer: Self-pay | Admitting: Internal Medicine

## 2010-10-12 NOTE — Assessment & Plan Note (Signed)
Summary: FU/SB.   Vital Signs:  Patient profile:   55 year old female Height:      66 inches (167.64 cm) Weight:      111.3 pounds (50.59 kg) BMI:     18.03 Temp:     97.4 degrees F (36.33 degrees C) oral Pulse rate:   76 / minute BP sitting:   112 / 76  (left arm) Cuff size:   regular  Vitals Entered By: Cynda Familia Duncan Dull) (August 22, 2010 2:29 PM) CC: Depression, lab results, left hand pain s/p trauma 1.5 wk ago Is Patient Diabetic? No Pain Assessment Patient in pain? yes     Location: back Intensity: 7 Type: sharp Onset of pain  Chronic Nutritional Status BMI of 25 - 29 = overweight  Have you ever been in a relationship where you felt threatened, hurt or afraid?No   Does patient need assistance? Functional Status Self care Ambulation Normal   Primary Care Baneen Wieseler:  Rosana Berger MD  CC:  Depression, lab results, and left hand pain s/p trauma 1.5 wk ago.  History of Present Illness: 55 yo woman with PMH of elevated transaminase is here for follow up on lab results.  She broke her right foot about 1 month ago and had repeat xray which showed a mildly displaced fracture.  She has been on the stablized boot but does not wear it frequently.  About 1.5 weeks ago she broke her left index finger trying to grab a banister.  She did not go to the ER and been trying to stabilize it at home with popcicle sticks and tape.   Patient states that she has been overloaded and been worrying about her liver.  She has been losing weight in the past 6 months unintentionally.  She would like to have treatment because her father had cancer all over and passed away.  She denies any SI/HI at this time, but does feel depressed; however, she does not want to be on any medication and feels like she can control it without medication.  She is willing to go to [facility] for treatment since she was being followed at pain clinic at [facility].  Currently, she sees Dr. Micheline Chapman for steroid  injection of her back.    Depression History:      The patient is having a depressed mood most of the day but denies diminished interest in her usual daily activities.        The patient denies that she has thought about ending her life.        Comments:  hasnt taken her prozac in over a mth and now feels depressed.   Preventive Screening-Counseling & Management  Alcohol-Tobacco     Alcohol drinks/day: 0     Smoking Status: current     Smoking Cessation Counseling: yes     Packs/Day: 4 cigs/day     Year Started: started 30 yrs ago     Year Quit: 2010  October  Allergies: No Known Drug Allergies  Social History: Smoking Status:  current  Review of Systems       weight loss about 11 lbs in the past several months  Physical Exam  General:  alert, well-developed, well-nourished, and well-hydrated.   Eyes:  slightly icterus Lungs:  normal respiratory effort, no intercostal retractions, no accessory muscle use, normal breath sounds, no crackles, and no wheezes.   Heart:  normal rate, regular rhythm, no murmur, no gallop, no rub, and no JVD.  Abdomen:  soft.  RUQ tenderness, Positive murphy's sign, liver span 10cm, no splenomegaly, no spider angioma, no distension, no masses, no rebound or guarding Extremities:  negative Schamroth's sign, no edema, cyanosis.  Left index finger: ecchymosis, slightly edema and tender, limited range of motion Neurologic:  alert & oriented X3, cranial nerves II-XII intact, strength normal in all extremities, and sensation intact to light touch.     Impression & Recommendations:  Problem # 1:  TRANSAMINASES, SERUM, ELEVATED (ICD-790.4)  Hepatitis C with a viral load of 653.  Patient would like to have treatment;howver, she is depressed which is one of the contraindications to antiviral therapy with Pegylated Interferon alpha and Ribavirin.   She also has positive Murphy's sign on physical exam today so I am concerned of obstruction.  Will get CMET  to see if her bili or alk phos is elevated.  She had a negative abdominal u/s on 06/27/2010 showed gallstone increased in size from 1 cm to 1.4cm but no perichocystic fluid or gallbladder wall thickening. Will get CBC, CMET, PT, INR Genotype - defer to Hepatitis Clinic Refer to  Hepatitis Clinic  Orders: T-CMP with Estimated GFR ([id number]) T-CBC w/Diff ([id number]) T-Protime, Auto ([id number])  Problem # 2:  DEPRESSION (ICD-311)  She is currently not on any medication because she feels like the Premarin is helping her and she barely takes Klonopin because it makes her drowsy.  She has tried Prozac in the past and did not like it.  We discussed the optimal chance for her to get treament of hepatitis would be antidepressant medication.  She agrees to try Celexa for at least 6-8 weeks.  I explained to her the side effects and if she began to feel more depressed or SI/HI once she starts her medication, she needs to stop the med and call me immediately.  She verbalizes understanding. Her updated medication list for this problem includes:    Klonopin 0.5 Mg Tabs (Clonazepam) .Marland Kitchen... 1 tab daily as needed    Celexa 20 Mg Tabs (Citalopram hydrobromide) .Marland Kitchen... Take 1 tablet by mouth every morning  Orders: T-CMP with Estimated GFR ([id number]) T-CBC w/Diff ([id number])  Problem # 3:  FRACTURE, FOOT (ICD-825.20) repeat Xray of right foot showed small oblique mildly displaced.  She states that she is doing better and tries to stabilize it as much as she can.  She has an appointment with her back pain doctor for a steroid injection next week. Will refer her to sport medicine clinic: September 12, 2010 at 2:30 PM  Complete Medication List: 1)  Gabapentin 300 Mg Caps (Gabapentin) .Marland Kitchen.. 1 tablet three times daily 2)  Ambien 10 Mg Tabs (Zolpidem tartrate) .Marland Kitchen.. 1 tab at bedtime as needed 3)  Klonopin 0.5 Mg Tabs (Clonazepam) .Marland Kitchen.. 1 tab daily as needed 4)  Premarin 0.625 Mg Tabs (Estrogens conjugated) .Marland Kitchen.. 1  tab daily 5)  Omeprazole 20 Mg Cpdr (Omeprazole) .Marland Kitchen.. 1 tab daily 6)  Percocet 5-325 Mg Tabs (Oxycodone-acetaminophen) .... Take 1 tablet by mouth every 4-6 hours as needed for pain 7)  Celexa 20 Mg Tabs (Citalopram hydrobromide) .... Take 1 tablet by mouth every morning  Patient Instructions: 1)  Will refer to Hepatitis Clinic, they will call you with an appointment once we fax over medical records 2)  Start taking Celexa 20mg  by mouth every morning for your depression 3)  Sport Medicine Referral- January 3rd, 2012 at 2:30 4)  Lab works today, I will call you with results 5)  Follow up in  2 weeks Prescriptions: CELEXA 20 MG TABS (CITALOPRAM HYDROBROMIDE) take 1 tablet by mouth every morning  #30 x 6   Entered and Authorized by:   Rosana Berger MD   Signed by:   Rosana Berger MD on 08/22/2010   Method used:   Print then Give to Patient   RxID:   [id number]    Orders Added: 1)  T-CMP with Estimated GFR [[id number]] 2)  T-CBC w/Diff [[id number]] 3)  T-Protime, Auto [[id number]] 4)  Est. Patient Level IV [10272]   Process Orders Check Orders Results:     [facility]: Order checked:     Rosana Berger MD NOT AUTHORIZED TO ORDER Tests Sent for requisitioning (August 22, 2010 4:38 PM):     08/22/2010: [facility] -- T-CMP with Estimated GFR [[id number]] (signed)     08/22/2010: [facility] -- T-CBC w/Diff [[id number]] (signed)     08/22/2010: [facility] -- T-Protime, Auto [[id number]] (signed)     Prevention & Chronic Care Immunizations   Influenza vaccine: Not documented   Influenza vaccine deferral: Deferred  (10/20/2009)    Tetanus booster: Not documented   Td booster deferral: Deferred  (10/20/2009)    Pneumococcal vaccine: Not documented  Colorectal Screening   Hemoccult: Not documented   Hemoccult action/deferral: Ordered  (06/22/2009)    Colonoscopy: Not documented   Colonoscopy  action/deferral: Refused  (10/20/2009)  Other Screening   Pap smear: Not documented   Pap smear due: 05/11/2010    Mammogram: ASSESSMENT: Negative - BI-RADS 1^MM DIGITAL W/ IMPLANTS SCREENING  (06/14/2010)   Mammogram action/deferral: Ordered  (05/25/2009)   Smoking status: current  (08/22/2010)   Smoking cessation counseling: yes  (08/22/2010)  Lipids   Total Cholesterol: 228  (05/25/2009)   LDL: 122  (05/25/2009)   LDL Direct: Not documented   HDL: 61  (05/25/2009)   Triglycerides: 223  (05/25/2009)

## 2010-10-12 NOTE — Assessment & Plan Note (Signed)
Summary: NP,R FOOT FRACTURE,L FINGER INJURY,MC   Vital Signs:  Patient profile:   55 year old female Height:      64 inches Weight:      113 pounds Pulse rate:   71 / minute BP sitting:   126 / 85  (right arm)  Vitals Entered By: Lillia Pauls CMA (September 12, 2010 2:26 PM) CC: injured L 4th finger, rt foot fx   Primary Care Provider:  Rosana Berger MD   History of Present Illness:  55 year old female:  1. R oblique 5th metatarsal shaft fracture, minimally displaced. Initially placed in a boot, > 4 weeks, initial f/u films one month later showed minimal bony healing. Patient also had an epidural steroid injection for back pain. Now wearing birkenstocks with mild pain, minimal swelling.  2. 3 weeks ago, L 2nd finger, struck, immobilized with a popsicle stick splint, tape. Now feeling mostly better, was concerned about potential fracture.   Allergies: No Known Drug Allergies  Past History:  Past medical, surgical, family and social histories (including risk factors) reviewed, and no changes noted (except as noted below).  Family History: Reviewed history from 05/25/2009 and no changes required. Father- died at 38 of prostate ca  Mother- uterine or ovarian ca, multiple MI (first at 69) Father's side of family had significant ca history- ovarian, prostate, colon  Social History: Reviewed history from 05/25/2009 and no changes required. 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 Was initially denied disability, now appealing that through an attorney. Denied medicaid. Lives by herself, widowed 2 yrs ago Two adult children, one lives locally. Limited support system.  Current smoker- 1 PPD x 30 yrs, now down to 4 cigs/day Alcohol- none Drugs- none  Review of Systems       REVIEW OF SYSTEMS  GEN: No systemic complaints, no fevers, chills, sweats, or other acute illnesses MSK: Detailed in the HPI GI: tolerating PO intake  without difficulty Neuro: No numbness, parasthesias, or tingling associated. Otherwise, the pertinent positives and negatives are listed above and in the HPI, otherwise a full review of systems has been reviewed and is negative unless noted positive.   Physical Exam  General:  GEN: Well-developed,well-nourished,in no acute distress; alert,appropriate and cooperative throughout examination HEENT: Normocephalic and atraumatic without obvious abnormalities. No apparent alopecia or balding. Ears, externally no deformities PULM: Breathing comfortably in no respiratory distress EXT: No clubbing, cyanosis, or edema PSYCH: Normally interactive. Cooperative during the interview. Pleasant. Friendly and conversant. Not anxious or depressed appearing. Normal, full affect.  Additional Exam:  Diagnostic Ultrasound Evaluation [employer] E, MSK ultrasound, MSK probe Anatomy scanned: R foot, L 2nd digit and MCP Indication: Pain Findings: evidence still of cortical disruption with callus formation with the appearance of bone on oblique fracture seen on prior xrays on R 5th MT shaft. L phalanx and MCP are contiguous with some fluid at the MCP, one osteophyte vs. calcification from prior trauma seen.   Foot/Ankle Exam  Foot Exam:    Right:    Inspection:  Normal    Palpation:  Abnormal       Location:  forefoot    Stability:  stable    Tenderness:  yes    Swelling:  yes    Erythema:  no    5th midshaft, minimal swelling and tenderness to palpation    Range of Motion:       Hallux MTP Dorsiflex-Active: 60  Hallux MTP Plantar Flex-Active: 45       Hallux IP-Active: full       Hallux MTP Dorsiflex-Passive: 60       Hallux MTP Plantar Flex-Passive: 45       Hallux IP-Passive: full    Left:    Inspection:  Normal    Palpation:  Normal    Stability:  stable    Tenderness:  no    Swelling:  no    Erythema:  no    Range of Motion:       Hallux MTP Dorsiflex-Active: 60       Hallux  MTP Plantar Flex-Active: 45       Hallux IP-Active: full       Hallux MTP Dorsiflex-Passive: 60       Hallux MTP Plantar Flex-Passive: 45       Hallux IP-Passive: full   Impression & Recommendations:  Problem # 1:  FINGER SPRAIN (ICD-842.10) Assessment New  finger sprain, not c/w occult fracture on u/s or exam. 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  c/w range of motion, no limitations  f/u as needed only  Orders: [place] LIMITED ([id number])  Problem # 2:  FRACTURE, FOOT (ICD-825.20) Assessment: Improved  2 xrays reviewed, was immoblized.  Now with good true bony callus formation seen on u/s, minimally symptomatic. c/w Birk or other shoe with minimal bending for now -- hold off on riding horses until able to run and jump.  Orders: [place] LIMITED ([id number])  Complete Medication List: 1)  Gabapentin 300 Mg Caps (Gabapentin) .Marland Kitchen.. 1 tablet three times daily 2)  Ambien 10 Mg Tabs (Zolpidem tartrate) .Marland Kitchen.. 1 tab at bedtime as needed 3)  Klonopin 0.5 Mg Tabs (Clonazepam) .Marland Kitchen.. 1 tab daily as needed 4)  Premarin 0.625 Mg Tabs (Estrogens conjugated) .Marland Kitchen.. 1 tab daily 5)  Omeprazole 20 Mg Cpdr (Omeprazole) .Marland Kitchen.. 1 tab daily 6)  Percocet 5-325 Mg Tabs (Oxycodone-acetaminophen) .... Take 1 tablet by mouth every 4-6 hours as needed for pain 7)  Celexa 20 Mg Tabs (Citalopram hydrobromide) .... Take 1 tablet by mouth every morning 8)  Amoxicillin 500 Mg Caps (Amoxicillin) .... Take 1 cap by mouth three times a day   Orders Added: 1)  New Patient Level IV [99204] 2)  [place] LIMITED [[id number]]     Finger Exam:    Right    Inspection:  Normal    Palpation:  Abnormal       Location:  1st metacarpal    Stability:  stable    Tenderness:  no    Swelling:  1st metacarpal    Erythema:  no    Range of Motion:       Flexion to Distal Palmar Crease:            Thumb-Opposition to small finger: DPC            Index Finger: 0cm            Middle Finger: 0cm             Ring Finger: 0cm            Small Finger: 0cm       Thumb:         IPJ: 45 degrees   MCP: 90 degrees          Index Finger:       DIPJ: 90 degrees   PIPJ: 90 degrees   MCP:  90 degrees          Middle Finger:       DIPJ: 90 degrees   PIPJ: 90 degrees   MCP: 90 degrees          Ring Finger:        DIPJ: 90 degrees   PIPJ: 90 degrees   MCP: 90 degrees          Small Finger:       DIPJ: 90 degrees   PIPJ: 90 degrees   MCP: 90 degrees       Left    Inspection:  Normal    Palpation:  Abnormal    Stability:  stable    Tenderness:  no    Swelling:  no    Erythema:  no    minimal ttp, pain with terminal motion    Range of Motion:       Flexion to Distal Palmar Crease:            Thumb-Opposition to small finger: DPC            Index Finger: 0cm            Middle Finger: 0cm            Ring Finger: 0cm            Small Finger: 0cm       Thumb:         IPJ: 45 degrees   MCP: 90 degrees          Index Finger:       DIPJ: 90 degrees   PIPJ: 80 degrees   MCP: 65 degrees          Middle Finger:       DIPJ: 90 degrees   PIPJ: 90 degrees   MCP: 90 degrees          Ring Finger:        DIPJ: 90 degrees   PIPJ: 90 degrees   MCP: 90 degrees          Small Finger:       DIPJ: 90 degrees   PIPJ: 90 degrees   MCP: 90 degrees

## 2010-10-12 NOTE — Assessment & Plan Note (Signed)
Summary: FU/SB.   Vital Signs:  Patient profile:   55 year old female Height:      66 inches Weight:      113.1 pounds BMI:     18.32 O2 Sat:      95 % on Room air Temp:     97.5 degrees F oral Pulse rate:   72 / minute BP sitting:   133 / 82  (right arm)  Vitals Entered By: Filomena Jungling NT II (September 12, 2010 9:32 AM)  O2 Flow:  Room air CC: 1 week of cough fever, chills, sweats, some sob/ green sputum, Depression Is Patient Diabetic? No Pain Assessment Patient in pain? no      Nutritional Status BMI of < 19 = underweight  Have you ever been in a relationship where you felt threatened, hurt or afraid?No   Does patient need assistance? Functional Status Self care Ambulation Normal   Primary Care Provider:  Rosana Berger MD  CC:  1 week of cough fever, chills, sweats, some sob/ green sputum, and Depression.  History of Present Illness: 55 y/o woman with PMH significant for Hep C, depression comes to the clinic for a f/u visit.  She reports having productive cough for 1 week, progressively worsening ever since then, bringing up greenish sputum, associated with some difficulty swallowing. She also reports sore throat, subjective fevers associated with chills and sweating.She also reports that once she noticed some blood with her nasal discharge. Denies postnasal drip, sneezing. She also complains of some soreness in her chest when she cough hard. Denies any wheezing.  Also denies N/V/D/abd pain.  She also denies any depressed mood and says that celexa and clonipin are helping her.     Depression History:      The patient denies a depressed mood most of the day and a diminished interest in her usual daily activities.         Preventive Screening-Counseling & Management  Alcohol-Tobacco     Alcohol drinks/day: 0     Smoking Status: current     Smoking Cessation Counseling: yes     Packs/Day: 4 cigs/day     Year Started: started 30 yrs ago     Year Quit: 2010   October  Caffeine-Diet-Exercise     Caffeine use/day: 2 cups/morning     Does Patient Exercise: no  Current Medications (verified): 1)  Gabapentin 300 Mg Caps (Gabapentin) .Marland Kitchen.. 1 Tablet Three Times Daily 2)  Ambien 10 Mg Tabs (Zolpidem Tartrate) .Marland Kitchen.. 1 Tab At Bedtime As Needed 3)  Klonopin 0.5 Mg Tabs (Clonazepam) .Marland Kitchen.. 1 Tab Daily As Needed 4)  Premarin 0.625 Mg Tabs (Estrogens Conjugated) .Marland Kitchen.. 1 Tab Daily 5)  Omeprazole 20 Mg Cpdr (Omeprazole) .Marland Kitchen.. 1 Tab Daily 6)  Percocet 5-325 Mg Tabs (Oxycodone-Acetaminophen) .... Take 1 Tablet By Mouth Every 4-6 Hours As Needed For Pain 7)  Celexa 20 Mg Tabs (Citalopram Hydrobromide) .... Take 1 Tablet By Mouth Every Morning 8)  Amoxicillin 500 Mg Caps (Amoxicillin) .... 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 Was initially denied disability, now appealing that through an attorney.  Denied medicaid. Lives by herself, widowed 2 yrs ago Two adult children, one lives locally. Limited support system.  Current smoker- 1 PPD x 30 yrs, now down to 4 cigs/day Alcohol- none Drugs- none  Risk Factors: Alcohol Use: 0 (09/12/2010) Caffeine Use: 2 cups/morning (09/12/2010) Exercise: no (09/12/2010)  Risk Factors: Smoking Status: current (09/12/2010) Packs/Day: 4 cigs/day (09/12/2010)  Review of Systems       The patient complains of fever.  The patient denies decreased hearing, hoarseness, syncope, dyspnea on exertion, peripheral edema, headaches, hemoptysis, melena, and hematochezia.    Physical Exam  General:  alert, well-developed,  well-nourished, and well-hydrated.   Head:  normocephalic and atraumatic.   Eyes:  vision grossly intact, pupils equal, pupils round, and pupils reactive to light.   Mouth:  pharynx erythematous but no exudates, pharynx pink and moist.   Neck:  supple and full ROM.   Lungs:  coarse breath sounds, mild exp rhonchi b/l, normal respiratory effort, no intercostal retractions, no accessory muscle use, no dullness, no fremitus, no crackles, and no wheezes.   Heart:  normal rate, regular rhythm, no murmur, no gallop, no rub, and no JVD.   Abdomen:  soft, tender to palpation in RUQ,normal bowel sounds, no distention, no masses, no guarding, and no rigidity.   Msk:  normal ROM, no joint tenderness, no joint swelling, no joint warmth, and no redness over joints.   Pulses:  2+ pulses b/l. Extremities:  no cyanosis, clubbing o edema. Neurologic:  alert & oriented X3, cranial nerves II-XII intact, strength normal in all extremities, sensation intact to light touch, sensation intact to pinprick, gait normal, and DTRs symmetrical and normal.     Impression & Recommendations:  Problem # 1:  UPPER RESPIRATORY INFECTION, ACUTE (ICD-465.9) Assessment Comment Only She reports having productive cough bringing up greenish phlegm, subjective fevers along with chills and sweating, and sore throat for 1 week. On exam she has eryhematous pharynx without any exudates and  coarse lung sounds. She most likely has viral URI with bacterial superinfection. Given her h/o hepatic impairment (Hep C), will treat her with amoxicillin for 7 days. She was adviced to do warm saline gargles 3 times a day and steam inhalation for symptom relief.  Problem # 2:  TRANSAMINASES, SERUM, ELEVATED (ICD-790.4) Assessment: Comment Only She has elevated transaminases in the setting of Hep C(viral load -653). Her referral to the Hep C clinic is pending at this time. Will update the patient as soon as it will be done. She no longer feels depressed  and can be started on the therapy as soon as referral is done.  Problem # 3:  DEPRESSION (ICD-311) Assessment: Comment Only She denies any depressed mood. Denies SI/HI. Continue current regimen for now. Her updated medication list for this problem includes:    Klonopin 0.5 Mg Tabs (Clonazepam) .Marland Kitchen... 1 tab daily as needed    Celexa 20 Mg Tabs (Citalopram hydrobromide) .Marland Kitchen... Take 1 tablet by mouth every morning  Problem # 4:  CARPAL TUNNEL SYNDROME, BILATERAL (ICD-354.0) She has an appointment at the Sports medicine clinic today. She was encouraged not to miss her appt.  Complete Medication List: 1)  Gabapentin 300 Mg Caps (Gabapentin) .Marland Kitchen.. 1 tablet three times daily 2)  Ambien 10 Mg Tabs (Zolpidem tartrate) .Marland Kitchen.. 1 tab at bedtime as needed 3)  Klonopin 0.5 Mg Tabs (Clonazepam) .Marland Kitchen.. 1 tab daily as needed 4)  Premarin 0.625 Mg Tabs (Estrogens conjugated) .Marland Kitchen.. 1 tab daily 5)  Omeprazole 20 Mg  Cpdr (Omeprazole) .Marland Kitchen.. 1 tab daily 6)  Percocet 5-325 Mg Tabs (Oxycodone-acetaminophen) .... Take 1 tablet by mouth every 4-6 hours as needed for pain 7)  Celexa 20 Mg Tabs (Citalopram hydrobromide) .... Take 1 tablet by mouth every morning 8)  Amoxicillin 500 Mg Caps (Amoxicillin) .... Take 1 cap by mouth three times a day  Patient Instructions: 1)  Please schedule a follow-up appointment in 2 months. 2)  Please take your medicines as prescribed. 3)  Pleae call the clinic if your symptoms do not improve after completing the complete treatment course. 4)  Pleae keep up your appouintment with the sports medicine clinic today. Prescriptions: AMOXICILLIN 500 MG CAPS (AMOXICILLIN) Take 1 cap by mouth three times a day  #21 x 0   Entered and Authorized by:   Elyse Jarvis   Signed by:   Elyse Jarvis on 09/12/2010   Method used:   Print then Give to Patient   RxID:   [id number]    Orders Added: 1)  Est. Patient Level IV [56433]   Immunization History:  Influenza Immunization History:     Influenza:  historical (06/06/2010)   Immunization History:  Influenza Immunization History:    Influenza:  Historical (06/06/2010)  Prevention & Chronic Care Immunizations   Influenza vaccine: Historical  (06/06/2010)   Influenza vaccine deferral: Deferred  (10/20/2009)    Tetanus booster: Not documented   Td booster deferral: Deferred  (10/20/2009)    Pneumococcal vaccine: Not documented  Colorectal Screening   Hemoccult: Not documented   Hemoccult action/deferral: Ordered  (06/22/2009)    Colonoscopy: Not documented   Colonoscopy action/deferral: Refused  (10/20/2009)  Other Screening   Pap smear: Not documented   Pap smear due: 05/11/2010    Mammogram: ASSESSMENT: Negative - BI-RADS 1^MM DIGITAL W/ IMPLANTS SCREENING  (06/14/2010)   Mammogram action/deferral: Ordered  (05/25/2009)   Smoking status: current  (09/12/2010)   Smoking cessation counseling: yes  (09/12/2010)  Lipids   Total Cholesterol: 228  (05/25/2009)   LDL: 122  (05/25/2009)   LDL Direct: Not documented   HDL: 61  (05/25/2009)   Triglycerides: 223  (05/25/2009)

## 2010-10-18 NOTE — Letter (Signed)
Summary: Disablity determination services  Disablity determination services   Imported By: Marily Memos 10/10/2010 13:49:07  _____________________________________________________________________  External Attachment:    Type:   Image     Comment:   External Document

## 2010-10-18 NOTE — Letter (Signed)
Summary: DISABILITY DETERMINATION SERVICES  DISABILITY DETERMINATION SERVICES   Imported By: Margie Billet 09/19/2010 15:30:58  _____________________________________________________________________  External Attachment:    Type:   Image     Comment:   External Document

## 2010-10-23 ENCOUNTER — Encounter: Payer: Self-pay | Admitting: Internal Medicine

## 2010-10-23 ENCOUNTER — Ambulatory Visit: Payer: Self-pay | Admitting: Physical Medicine and Rehabilitation

## 2010-10-23 ENCOUNTER — Ambulatory Visit (INDEPENDENT_AMBULATORY_CARE_PROVIDER_SITE_OTHER): Payer: Self-pay | Admitting: Internal Medicine

## 2010-10-23 ENCOUNTER — Encounter: Payer: Self-pay | Attending: Physical Medicine and Rehabilitation

## 2010-10-23 VITALS — BP 141/87 | HR 77 | Temp 97.5°F | Ht 64.0 in | Wt 112.1 lb

## 2010-10-23 DIAGNOSIS — M542 Cervicalgia: Secondary | ICD-10-CM | POA: Insufficient documentation

## 2010-10-23 DIAGNOSIS — G8921 Chronic pain due to trauma: Secondary | ICD-10-CM | POA: Insufficient documentation

## 2010-10-23 DIAGNOSIS — M19049 Primary osteoarthritis, unspecified hand: Secondary | ICD-10-CM

## 2010-10-23 DIAGNOSIS — M5030 Other cervical disc degeneration, unspecified cervical region: Secondary | ICD-10-CM

## 2010-10-23 DIAGNOSIS — M25549 Pain in joints of unspecified hand: Secondary | ICD-10-CM

## 2010-10-23 DIAGNOSIS — M959 Acquired deformity of musculoskeletal system, unspecified: Secondary | ICD-10-CM | POA: Insufficient documentation

## 2010-10-23 DIAGNOSIS — M25539 Pain in unspecified wrist: Secondary | ICD-10-CM | POA: Insufficient documentation

## 2010-10-23 DIAGNOSIS — F329 Major depressive disorder, single episode, unspecified: Secondary | ICD-10-CM

## 2010-10-23 DIAGNOSIS — M19039 Primary osteoarthritis, unspecified wrist: Secondary | ICD-10-CM

## 2010-10-23 DIAGNOSIS — M79609 Pain in unspecified limb: Secondary | ICD-10-CM | POA: Insufficient documentation

## 2010-10-23 DIAGNOSIS — F17200 Nicotine dependence, unspecified, uncomplicated: Secondary | ICD-10-CM | POA: Insufficient documentation

## 2010-10-23 DIAGNOSIS — M25579 Pain in unspecified ankle and joints of unspecified foot: Secondary | ICD-10-CM | POA: Insufficient documentation

## 2010-10-23 DIAGNOSIS — B1920 Unspecified viral hepatitis C without hepatic coma: Secondary | ICD-10-CM | POA: Insufficient documentation

## 2010-10-23 DIAGNOSIS — M545 Low back pain, unspecified: Secondary | ICD-10-CM | POA: Insufficient documentation

## 2010-10-23 DIAGNOSIS — K219 Gastro-esophageal reflux disease without esophagitis: Secondary | ICD-10-CM

## 2010-10-23 NOTE — Assessment & Plan Note (Signed)
Well controlled on current medication regimen. Will make no changes today.

## 2010-10-23 NOTE — Patient Instructions (Signed)
Please schedule follow up appointment in 6 months.  

## 2010-10-23 NOTE — Assessment & Plan Note (Signed)
Last x-ray was done in 2008, she is following with pain clinic and she has appointment today. Will follow up on recommendations.

## 2010-10-23 NOTE — Progress Notes (Signed)
  Subjective:    Patient ID: Amanda Bennett, female    DOB: 1956-03-25, 55 y.o.   MRN: [id number]  HPI  55 yo female with PMH outlined below presents to [facility] with main concern of continuous left arm pain and neck pain. This is chronic pain for her and has been present over 5 years. She has seen multiple specialists in the past and currently is seen at the pain clinic. She reports receiving injections in her shoulder and neck and has a follow up appointment today. She denies any recent sicknesses or hospitalizations, no episodes of fevers, chills, no abdominal or urinary concerns, no systemic symptoms. In addition she has carpal tunnel syndrome in both hands and is also being seen by specialist for that.   Review of Systems  Constitutional: Negative.   Respiratory: Negative.   Cardiovascular: Negative.   Gastrointestinal: Negative.   Genitourinary: Negative.   Psychiatric/Behavioral: Negative.        Objective:   Physical Exam  Constitutional: She is oriented to person, place, and time. She appears well-developed and well-nourished. No distress.  Neck: Normal range of motion. Neck supple. No JVD present. No tracheal deviation present. No thyromegaly present.  Cardiovascular: Normal rate, regular rhythm, normal heart sounds and intact distal pulses.  Exam reveals no gallop and no friction rub.   No murmur heard. Pulmonary/Chest: Effort normal and breath sounds normal. No respiratory distress. She has no wheezes. She has no rales. She exhibits no tenderness.  Abdominal: Soft. Bowel sounds are normal. She exhibits no distension and no mass. There is no tenderness. There is no rebound and no guarding.  Musculoskeletal:       Left shoulder: She exhibits tenderness and pain. She exhibits normal range of motion, no bony tenderness, no swelling, no effusion, no crepitus, no deformity, no laceration, no spasm, normal pulse and normal strength.       Cervical back: She exhibits tenderness and pain.  She exhibits no swelling, no edema, no deformity, no laceration, no spasm and normal pulse.  Lymphadenopathy:    She has no cervical adenopathy.  Neurological: She is alert and oriented to person, place, and time. She displays normal reflexes. No cranial nerve deficit. She exhibits normal muscle tone. Coordination normal.  Skin: Skin is warm and dry. No rash noted. She is not diaphoretic. No erythema. No pallor.  Psychiatric: She has a normal mood and affect. Her behavior is normal. Judgment and thought content normal.          Assessment & Plan:

## 2010-10-23 NOTE — Assessment & Plan Note (Signed)
Well controlled on current medication regimen, no changes will be made today.

## 2010-10-24 ENCOUNTER — Other Ambulatory Visit: Payer: Self-pay | Admitting: Physical Medicine and Rehabilitation

## 2010-10-24 ENCOUNTER — Ambulatory Visit (HOSPITAL_COMMUNITY)
Admission: RE | Admit: 2010-10-24 | Discharge: 2010-10-24 | Disposition: A | Discharge status: Home / Self Care | Payer: Self-pay | Source: Ambulatory Visit | Attending: Physical Medicine and Rehabilitation | Admitting: Physical Medicine and Rehabilitation

## 2010-10-24 DIAGNOSIS — M542 Cervicalgia: Secondary | ICD-10-CM

## 2010-10-25 ENCOUNTER — Telehealth: Payer: Self-pay | Admitting: *Deleted

## 2010-10-25 NOTE — Telephone Encounter (Signed)
This request is third hand.  Can we get Dr. Leretha Dykes actual note?

## 2010-10-25 NOTE — Telephone Encounter (Signed)
Pt calls to say dr Pamelia Hoit did some labs and wants pt referred to rheumatology due to RA factor being >600, labs faxed from dr Pamelia Hoit, will give to attending, please send  referral to dr ho's nurse.

## 2010-10-26 NOTE — Telephone Encounter (Signed)
i showed the lab results to dr Meredith Pel this am, he stated to make sure they were put in chart and to page the pcp and put in her box, i paged the pcp, she didn't call back

## 2010-10-27 NOTE — Telephone Encounter (Signed)
Dr Anselm Jungling paged and given lab results.

## 2010-10-31 ENCOUNTER — Encounter: Payer: Self-pay | Admitting: Family Medicine

## 2010-11-07 ENCOUNTER — Telehealth: Payer: Self-pay | Admitting: *Deleted

## 2010-11-07 DIAGNOSIS — M2550 Pain in unspecified joint: Secondary | ICD-10-CM

## 2010-11-07 NOTE — Telephone Encounter (Signed)
Pt called in again today.  She has been waiting 2 weeks to hear about referral to rheumatologist.   Pt has a Therapist, music  card for insurance. Will you please put in the referral?  Pt calls to say dr Pamelia Hoit did some labs and wants pt referred to rheumatology due to RA factor being >600, labs faxed from dr Pamelia Hoit, will give to attending, please send referral to dr ho's nurse. 10/25/10

## 2010-11-07 NOTE — Letter (Signed)
Summary: Generic Letter  [facility]  [address]   [place], [place] [address]   Phone: [phone]  Fax: [phone]    10/31/2010  Amanda Bennett [address] [place], [place]  [address]  [place]  TO WHOM IT MAY CONCERN,   I examined the patient once on 09/12/2010 with a finger sprain and a also discussed a healed foot fracture.  Our office only evaluated her based on this musculoskeletal finding. I would not assign any permanent limitation physically or mentally, and we have not scheduled her for any follow-up with our clinic.      Sincerely,   Hannah Beat MD

## 2010-11-20 ENCOUNTER — Ambulatory Visit: Payer: Self-pay | Admitting: Physical Medicine and Rehabilitation

## 2010-11-20 ENCOUNTER — Other Ambulatory Visit: Payer: Self-pay | Admitting: Physical Medicine and Rehabilitation

## 2010-11-20 ENCOUNTER — Encounter: Payer: Self-pay | Attending: Physical Medicine and Rehabilitation

## 2010-11-20 DIAGNOSIS — M545 Low back pain, unspecified: Secondary | ICD-10-CM | POA: Insufficient documentation

## 2010-11-20 DIAGNOSIS — M79609 Pain in unspecified limb: Secondary | ICD-10-CM | POA: Insufficient documentation

## 2010-11-20 DIAGNOSIS — M959 Acquired deformity of musculoskeletal system, unspecified: Secondary | ICD-10-CM | POA: Insufficient documentation

## 2010-11-20 DIAGNOSIS — M25539 Pain in unspecified wrist: Secondary | ICD-10-CM | POA: Insufficient documentation

## 2010-11-20 DIAGNOSIS — M19049 Primary osteoarthritis, unspecified hand: Secondary | ICD-10-CM

## 2010-11-20 DIAGNOSIS — M25579 Pain in unspecified ankle and joints of unspecified foot: Secondary | ICD-10-CM | POA: Insufficient documentation

## 2010-11-20 DIAGNOSIS — M542 Cervicalgia: Secondary | ICD-10-CM

## 2010-11-20 DIAGNOSIS — B1920 Unspecified viral hepatitis C without hepatic coma: Secondary | ICD-10-CM | POA: Insufficient documentation

## 2010-11-20 DIAGNOSIS — G8921 Chronic pain due to trauma: Secondary | ICD-10-CM | POA: Insufficient documentation

## 2010-11-20 DIAGNOSIS — F17200 Nicotine dependence, unspecified, uncomplicated: Secondary | ICD-10-CM | POA: Insufficient documentation

## 2010-11-20 DIAGNOSIS — M47812 Spondylosis without myelopathy or radiculopathy, cervical region: Secondary | ICD-10-CM

## 2010-11-20 DIAGNOSIS — M79602 Pain in left arm: Secondary | ICD-10-CM

## 2010-11-24 ENCOUNTER — Ambulatory Visit (HOSPITAL_COMMUNITY)
Admission: RE | Admit: 2010-11-24 | Discharge: 2010-11-24 | Disposition: A | Discharge status: Home / Self Care | Payer: Self-pay | Source: Ambulatory Visit | Attending: Physical Medicine and Rehabilitation | Admitting: Physical Medicine and Rehabilitation

## 2010-11-24 DIAGNOSIS — M5380 Other specified dorsopathies, site unspecified: Secondary | ICD-10-CM | POA: Insufficient documentation

## 2010-11-24 DIAGNOSIS — M79609 Pain in unspecified limb: Secondary | ICD-10-CM | POA: Insufficient documentation

## 2010-11-24 DIAGNOSIS — M542 Cervicalgia: Secondary | ICD-10-CM | POA: Insufficient documentation

## 2010-11-24 DIAGNOSIS — M79602 Pain in left arm: Secondary | ICD-10-CM

## 2010-12-07 ENCOUNTER — Other Ambulatory Visit: Payer: Self-pay

## 2010-12-07 DIAGNOSIS — M2550 Pain in unspecified joint: Secondary | ICD-10-CM

## 2010-12-08 LAB — CYCLIC CITRUL PEPTIDE ANTIBODY, IGG: Cyclic Citrullin Peptide Ab: <2 U/mL (ref 0.0–5.0)

## 2010-12-08 LAB — C-REACTIVE PROTEIN: CRP: 0.1 mg/dL (ref <0.6)

## 2010-12-11 ENCOUNTER — Encounter: Payer: Self-pay | Attending: Physical Medicine and Rehabilitation | Admitting: Physical Medicine and Rehabilitation

## 2010-12-18 ENCOUNTER — Telehealth: Payer: Self-pay | Admitting: Licensed Clinical Social Worker

## 2010-12-18 NOTE — Telephone Encounter (Signed)
Gavin Pound will come in to visit social work regarding her Disability on Wed April 11th at 11:00 AM.

## 2010-12-20 ENCOUNTER — Encounter: Payer: Self-pay | Admitting: Licensed Clinical Social Worker

## 2010-12-20 ENCOUNTER — Ambulatory Visit: Payer: Self-pay | Admitting: Licensed Clinical Social Worker

## 2010-12-20 NOTE — Telephone Encounter (Signed)
This appmt is not showing up in today's schedule.   Rerouting to front office.

## 2010-12-20 NOTE — Progress Notes (Signed)
Met with patient to assist her with reconsideration under Disability.   See Clinical Support appointment note.

## 2010-12-21 ENCOUNTER — Other Ambulatory Visit: Payer: Self-pay | Admitting: Licensed Clinical Social Worker

## 2010-12-21 DIAGNOSIS — G5600 Carpal tunnel syndrome, unspecified upper limb: Secondary | ICD-10-CM

## 2010-12-21 NOTE — Progress Notes (Signed)
45 minutes. Social Work.  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   Met with this very pleasant and informed patient and assisted her in filing for reconsideration on line for Social Security Disability.  She gave me a recap of her medical history and we reviewed it together.  She mentions a fairly new diagnosis of Hep C which was not included in the Social Security Decision and a pending referral to [facility] for possible interferon treatment.    We will also write a letter of support on Linde's behalf regarding her Disability.  Gavin Pound will get back with me when she finds name of examiner so we can send letter.

## 2010-12-25 ENCOUNTER — Encounter: Payer: Self-pay | Admitting: Licensed Clinical Social Worker

## 2010-12-26 LAB — CBC
HCT: 41.6 % (ref 36.0–46.0)
Hemoglobin: 14.7 g/dL (ref 12.0–15.0)
MCHC: 35.4 g/dL (ref 30.0–36.0)
MCV: 94.5 fL (ref 78.0–100.0)
Platelets: 228 10*3/uL (ref 150–400)
RBC: 4.4 MIL/uL (ref 3.87–5.11)
RDW: 13.1 % (ref 11.5–15.5)
WBC: 6.2 10*3/uL (ref 4.0–10.5)

## 2010-12-26 LAB — COMPREHENSIVE METABOLIC PANEL
ALT: 49 U/L — ABNORMAL HIGH (ref 0–35)
AST: 60 U/L — ABNORMAL HIGH (ref 0–37)
Albumin: 3.9 g/dL (ref 3.5–5.2)
Alkaline Phosphatase: 115 U/L (ref 39–117)
BUN: 5 mg/dL — ABNORMAL LOW (ref 6–23)
CO2: 26 mEq/L (ref 19–32)
Calcium: 9.6 mg/dL (ref 8.4–10.5)
Chloride: 105 mEq/L (ref 96–112)
Creatinine, Ser: 0.59 mg/dL (ref 0.4–1.2)
GFR calc Af Amer: >60 mL/min (ref >60)
GFR calc non Af Amer: >60 mL/min (ref >60)
Glucose, Bld: 94 mg/dL (ref 70–99)
Potassium: 4.5 mEq/L (ref 3.5–5.1)
Sodium: 138 mEq/L (ref 135–145)
Total Bilirubin: 0.6 mg/dL (ref 0.3–1.2)
Total Protein: 7.9 g/dL (ref 6.0–8.3)

## 2010-12-26 LAB — DIFFERENTIAL
Basophils Absolute: 0.1 10*3/uL (ref 0.0–0.1)
Basophils Relative: 1 % (ref 0–1)
Eosinophils Absolute: 0.1 10*3/uL (ref 0.0–0.7)
Eosinophils Relative: 2 % (ref 0–5)
Lymphocytes Relative: 26 % (ref 12–46)
Lymphs Abs: 1.6 10*3/uL (ref 0.7–4.0)
Monocytes Absolute: 0.5 10*3/uL (ref 0.1–1.0)
Monocytes Relative: 9 % (ref 3–12)
Neutro Abs: 3.9 10*3/uL (ref 1.7–7.7)
Neutrophils Relative %: 63 % (ref 43–77)

## 2010-12-26 LAB — URINALYSIS, ROUTINE W REFLEX MICROSCOPIC
Glucose, UA: NEGATIVE mg/dL
Hgb urine dipstick: NEGATIVE
Ketones, ur: NEGATIVE mg/dL
Nitrite: NEGATIVE
Protein, ur: NEGATIVE mg/dL
Specific Gravity, Urine: 1.019 (ref 1.005–1.030)
Urobilinogen, UA: 1 mg/dL (ref 0.0–1.0)
pH: 5.5 (ref 5.0–8.0)

## 2010-12-26 LAB — LIPASE, BLOOD: Lipase: 26 U/L (ref 11–59)

## 2011-01-04 ENCOUNTER — Other Ambulatory Visit: Payer: Self-pay | Admitting: *Deleted

## 2011-01-04 ENCOUNTER — Telehealth: Payer: Self-pay | Admitting: Licensed Clinical Social Worker

## 2011-01-04 ENCOUNTER — Ambulatory Visit (INDEPENDENT_AMBULATORY_CARE_PROVIDER_SITE_OTHER): Payer: Self-pay | Admitting: Gastroenterology

## 2011-01-04 DIAGNOSIS — B182 Chronic viral hepatitis C: Secondary | ICD-10-CM

## 2011-01-04 NOTE — Telephone Encounter (Signed)
Spowers, tapple([facility]), pt will be informed she needs to update info w/ MAP and she will be given the premarin this one time, could you please make note in med list reflecting: #100 PREMARIN 0.625MG , [place], LOT# [id number] EXP 02/13.  Thank you, h.

## 2011-01-04 NOTE — Telephone Encounter (Signed)
This med was ordered from the [facility]. And sent to internal medicine clinic, after speaking w/

## 2011-01-04 NOTE — Telephone Encounter (Signed)
Amanda Bennett's cousin called and said that Disability needed March 2012 MRI and other recent medical documentation to overturn a denial and that patient was not doing well emotionally and was without food in the home.  I faxed over letter of support, recent MRI,  Lab confirming HEP C to [employer] at [phone].  Left message with pt that all items were faxed this AM.

## 2011-01-08 NOTE — Telephone Encounter (Signed)
i have tried all numbers listed for pt, left a message on 1, not able to speak w/ pt as of yet, will continue

## 2011-01-09 NOTE — Telephone Encounter (Signed)
Finally spoke w/ pt she will pickup and arrange to speak w/ MAP for assist

## 2011-01-12 ENCOUNTER — Ambulatory Visit: Payer: Self-pay | Admitting: Physical Medicine and Rehabilitation

## 2011-01-16 ENCOUNTER — Encounter: Payer: Self-pay | Admitting: Internal Medicine

## 2011-01-16 ENCOUNTER — Ambulatory Visit (INDEPENDENT_AMBULATORY_CARE_PROVIDER_SITE_OTHER): Payer: Medicaid Other | Admitting: Internal Medicine

## 2011-01-16 ENCOUNTER — Encounter: Payer: Self-pay | Attending: Physical Medicine and Rehabilitation | Admitting: Physical Medicine and Rehabilitation

## 2011-01-16 DIAGNOSIS — Z800 Family history of malignant neoplasm of digestive organs: Secondary | ICD-10-CM

## 2011-01-16 DIAGNOSIS — F329 Major depressive disorder, single episode, unspecified: Secondary | ICD-10-CM

## 2011-01-16 DIAGNOSIS — G5610 Other lesions of median nerve, unspecified upper limb: Secondary | ICD-10-CM | POA: Insufficient documentation

## 2011-01-16 DIAGNOSIS — R209 Unspecified disturbances of skin sensation: Secondary | ICD-10-CM | POA: Insufficient documentation

## 2011-01-16 DIAGNOSIS — L989 Disorder of the skin and subcutaneous tissue, unspecified: Secondary | ICD-10-CM

## 2011-01-16 DIAGNOSIS — M542 Cervicalgia: Secondary | ICD-10-CM

## 2011-01-16 DIAGNOSIS — G894 Chronic pain syndrome: Secondary | ICD-10-CM

## 2011-01-16 DIAGNOSIS — R7401 Elevation of levels of liver transaminase levels: Secondary | ICD-10-CM

## 2011-01-16 DIAGNOSIS — M79609 Pain in unspecified limb: Secondary | ICD-10-CM

## 2011-01-16 DIAGNOSIS — N951 Menopausal and female climacteric states: Secondary | ICD-10-CM

## 2011-01-17 ENCOUNTER — Encounter: Payer: Self-pay | Admitting: Internal Medicine

## 2011-01-17 DIAGNOSIS — L989 Disorder of the skin and subcutaneous tissue, unspecified: Secondary | ICD-10-CM | POA: Insufficient documentation

## 2011-01-17 DIAGNOSIS — N951 Menopausal and female climacteric states: Secondary | ICD-10-CM | POA: Insufficient documentation

## 2011-01-17 NOTE — Assessment & Plan Note (Signed)
Positive Hepatitis C.  She is being managed by Dr. Jacqualine Mau at Hepatology clinic.  Genotype is pending which will determines if she also needs a protease inhibitor or not.  Dr. Jacqualine Mau also recommends that patient get vaccination for hepatitis B.   -Will get Hep B series vaccination

## 2011-01-17 NOTE — Assessment & Plan Note (Addendum)
Well controlled. She denies any SI/HI and other symptoms of depression.  Continue current medication-Celexa 20mg  po qd, especially she is going to start hepatitis C treatment soon.

## 2011-01-17 NOTE — Patient Instructions (Addendum)
Need to follow up with Dr. Jacqualine Mau Get  Hepatitis B vacccine series Will continue to monitor your nodule, will consider referral to dermatology   GI referral for colonoscopy, it was due with Dr. Randa Evens on 11/2010; however, patient was dismissed from his office 2/2 outstanding balance.

## 2011-01-17 NOTE — Progress Notes (Signed)
55 yo woman with PMH of hepatitis C, mild depression presents for routine follow up.  She was recently seen by Dr. Jacqualine Mau with hepatology clinic on 01/04/11 for treatment options.  Lab for genotype was drawn and result is still pending.  According to Dr. Jacqualine Mau' note, patient's mild depression is well controlled with antidepressant at this time and is not a contraindication for hepatitis treatment.  She states that she is ready for treatment.  She reports noticing a small 1x1cm nodule located on her sternum about 1 month, denies any tenderness, erythema or drainage. No other complaints.  ROS: as per HPI  PE: GEn: NAD, pleasant and cooperative HEENT: no lymphadenopathy, PERRLA, EOMI Heart: RRR, no m/g/r Chest: CTAB, no wheezes or crackles Abd: soft, NT, ND, +BS Skin: 1x1cm nonmobile nodule without tenderness or erythema or drainage located at midsternum b/t cleavage area.   Ext: no edema or cynosis Neuro: nonfocal Psych: appropriate, non-depressed

## 2011-01-23 NOTE — Op Note (Signed)
Amanda Bennett, Amanda Bennett                ACCOUNT NO.:  [id number]   MEDICAL RECORD NO.:  [id number]          PATIENT TYPE:  EMS   LOCATION:  [facility]                         FACILITY:  [facility]   PHYSICIAN:  Dionne Ano. Gramig, M.D.DATE OF BIRTH:  08/29/1956   DATE OF PROCEDURE:  DATE OF DISCHARGE:  06/18/2008                               OPERATIVE REPORT   PREOPERATIVE DIAGNOSIS:  Open right ring finger fracture with near  amputation and nailbed disarray.   POSTOPERATIVE DIAGNOSIS:  Open right ring finger fracture with near  amputation and nailbed disarray.   PROCEDURE:  1. Incision and drainage, open fracture right ring finger, skin,      subcutaneous tissue, and bone.  2. Nail plate removal, right ring finger.  3. Open reduction internal fixation, right ring finger, distal      phalanx.  4. Nailbed repair, right ring finger.   SURGEON:  Dionne Ano. Amanda Pea, MD   ASSISTANT:  None.   COMPLICATIONS:  None.   ANESTHESIA:  Intermetacarpal block.   TOURNIQUET TIME:  Less than an hour.   INDICATIONS FOR THE PROCEDURE:  The patient is a pleasant female who  presents with above-mentioned diagnosis.  I have counseled her in  regards to risks and benefits of surgery including risk of infection,  bleeding, anesthesia, damage to neuromuscular structures, and failure of  the surgery to accomplish its intended goals of relieving symptoms and  restoring function.  With these in mind, she decided to proceed.  All  questions were encouraged and answered preoperatively.   OPERATIVE PROCEDURE:  The patient was seen by myself and given  intermetacarpal block.  Following this, she was prepped and draped in  usual sterile fashion with Betadine scrub and paint.  Saline 3 L was  lavaged through the wound.  I performed I and D of the skin,  subcutaneous tissue, bone, nailbed, and nail plate tissue.  Following  this, I performed nail plate removal and reduced the fracture.  I placed  0.035 K-wire, checked  this under x-ray, and made sure it looked  excellent and it did.  I was pleased with this and coaptation of bony  ends.  I then performed a nailbed repair with 6-0 chromic and then  performed a lateral nail fold repair with combination of chromic and  Prolene.  Adaptic was placed on the eponychial fold.  Final x-rays were  taken.  Pin was bent upon itself and stabilized.  The patient was noted  to have excellent refill with less than of 45-minute tourniquet time and  no complicating features.  She was dressed sterilely.   PLAN:  She will be discharged to home on Keflex, Percocet, Robaxin.  Return to the office in 6-7 days therapy and 12-14 days as well.  Should  have any problems occur, the patient will notify us.  I have discussed  with her the relevant do's and do not's.  All questions were encouraged  and answered.      Dionne Ano. Amanda Pea, M.D.  Electronically Signed     WMG/MEDQ  D:  06/18/2008  T:  06/18/2008  Job:  [id number]

## 2011-01-23 NOTE — Op Note (Signed)
NAMECHENE, KASINGER                ACCOUNT NO.:  [id number]   MEDICAL RECORD NO.:  [id number]          PATIENT TYPE:  INP   LOCATION:  5001                         FACILITY:  [facility]   PHYSICIAN:  Dionne Ano. Gramig, M.D.DATE OF BIRTH:  Feb 13, 1956   DATE OF PROCEDURE:  06/24/2008  DATE OF DISCHARGE:                               OPERATIVE REPORT   PREOPERATIVE DIAGNOSIS:  Status post near amputation right ring finger 7  days ago, treated with reconstruction efforts.  The patient presents  with a necrotic tip of her small finger secondary to the injury and  infection about the wound site.   POSTOPERATIVE DIAGNOSIS:  Status post near amputation right ring finger  7 days ago, treated with reconstruction efforts.  The patient presents  with a necrotic tip of her small finger secondary to the injury and  infection about the wound site.   PROCEDURE:  Irrigation and debridement of a deep abscess with removal of  necrotic tissue, distal fingertip.   SURGEON:  Dionne Ano. Amanda Pea, MD   ASSISTANT:  Karie Chimera, [facility]   COMPLICATIONS:  None.   ANESTHESIA:  General.   TOURNIQUET TIME:  Zero.   INDICATIONS FOR THE PROCEDURE:  This patient is a 55 year old female who  sustained a near complete amputation to the finger.  This was treated  with reconstruction efforts 7 days ago.  She presented to my office for  followup and had significant necrosis.  Given her original injury, this  was not unusual to have some demarcation secondary to the wound  declaring itself.  However, the patient did have a foul-smelling wound  and obvious infection.  This was quite concerning and I have discussed  with her that unfortunately, she had a severe infection.  Her original  injury was making coleslaw.  She does work as a Research scientist (medical).  She was  given antibiotics before and after her operation initially, but  nevertheless gone onto infectious sequelae and a necrosis at the tip of  her finger where there is  poor vascularity.  I have counseled in regards  to risks and benefits of surgery and she understands the need for I&D  and aggressive antibiotic treatment.   OPERATIVE PROCEDURE:  The patient was seen by myself and anesthesia,  taken to operative suite, and after smooth induction of general  anesthesia, arm was marked, consent signed, and time-out was called.  In  the operative suite, she was well padded, prepped and draped in usual  sterile fashion with Betadine scrub.  Following this, I performed I&D of  skin, subcutaneous tissue.  I performed tissue and fluid cultures.  She  had a foul-smelling wound.  This was about the fracture site and distal  to the fracture site over the sterile matrix and pulp tissue.  There was  frank necrosis of the wound.  I think this combination of her injury and  the wound declaring itself as well as the infectious sequelae.  This was  debrided aggressively.  I removed the distal portion of her bony  architecture that was nonviable and all tissue that had  a dark and  prenecrotic and necrotic appearance.  There was a volar flap of tissue  left and the proximal bone was I&D'd aggressively.  She did not encroach  into a flexor sheath system.  The finger was generally swollen, but I  was able to get her debrided back to good margins.  She lost the distal  portion of her distal phalanx tip given the necrosis; however, she had a  good volar flap that we were able to keep intact.  I irrigated it with  greater than 3 L of saline as I did in her initial operation and made  sure that her margins looked excellent.  Following this, we then dressed  the wound in an open guillotine-type manner so that she would have ample  opportunity to drain the wound and allow to continue to declare itself.  I will plan to take her back to the operative suite in 24-48 hours for a  re-look.   I did place a small amount of local in the palm after treating it with  Betadine a second  time for postop analgesia.  The patient has been quite  tender and painful of course.   Following this, she was taken to recovery room.  She will be admitted  for IV antibiotics in the form of vancomycin and Fortaz.  We will  recommend pharmacy dosing.  We are also going to monitor her labs and  plan for a re-look in 24-48 hours.  I have discussed with her relevant  do's and don'ts, etc.  I would recommend that a Social Service consult  for Medicaid application and would also monitor her condition quite  closely.  She has a history of abscess after a tetanus shot as well as a  difficulty healing a right clavicle fracture and hysterectomy, which  both required multiple operative interventions.  I would keep a keen eye  on her wound and look for any immune deficiency, etc. as necessary.  I  have discussed these issues with her at length, the do's and don'ts,  etc.  All questions have been encouraged and answered.      Dionne Ano. Amanda Pea, M.D.  Electronically Signed     WMG/MEDQ  D:  06/24/2008  T:  06/25/2008  Job:  [id number]

## 2011-01-23 NOTE — Op Note (Signed)
Amanda Bennett, Amanda Bennett                ACCOUNT NO.:  [id number]   MEDICAL RECORD NO.:  [id number]          PATIENT TYPE:  INP   LOCATION:  5001                         FACILITY:  [facility]   PHYSICIAN:  Dionne Ano. Gramig, M.D.DATE OF BIRTH:  02-17-1956   DATE OF PROCEDURE:  DATE OF DISCHARGE:                               OPERATIVE REPORT   PREOPERATIVE DIAGNOSIS:  Status post right ring finger near-complete  amputation which is gone on to infectious and necrotic sequelae after  wound declaration.  The patient presents for second irrigation and  debridement.   POSTOPERATIVE DIAGNOSIS:  Status post right ring finger near-complete  amputation which is gone on to infectious and necrotic sequelae after  wound declaration.  The patient presents for second irrigation and  debridement.   PROCEDURE:  Irrigation and debridement of right ring finger with loose  closure.  This involved skin, subcutaneous tissue, and bone, and was an  excisional debridement.   SURGEON:  Dionne Ano. Amanda Pea, MD   ASSISTANT:  None.   COMPLICATIONS:  None.   ANESTHESIA:  General.   TOURNIQUET TIME:  Zero.   INDICATIONS FOR PROCEDURE:  The patient is pleasant female, who had a  very unfortunate injury to her finger.  She underwent attempted  reconstruction of a near-complete amputation.  Following this, she  developed infectious sequelae and necrosis where the wound declared  itself and became necrotic.  The patient presented 48 hours ago and we  performed I and D.  She now presents for repeat I and D, possible  closure, etc.  Her cultures have grown out Gram-positive and Gram-  negative species and she is on vancomycin and Fortaz.  She understands  risks and benefits of the surgery including risk of infection, bleeding  anesthesia, damage to normal structures, and failure of surgery to  accomplish its intended goals or relieving symptoms and restoring  function, with this in mind she desires to proceed.  All  questions have  been encouraged and answered preoperatively.   OPERATIVE PROCEDURE:  The patient received Omnicef anesthesia, permit  was signed, she was counseled, appropriately prepped and draped in the  usual sterile fashion after general anesthesia was employed by Dr.  Laverle Hobby & staff.  Once she was laid in supine position, we  scrubbed the arm with Betadine scrub and paint.  Wound conditions were  markedly improved.  There was no reaccumulation of purulence, I then  performed I and D of skin, subcutaneous tissue, bone and soft tissue  excisional debridement nature was accomplished.  I then performed a  revision amputation and gently tacked with chromic suture, the volar  flap which I advanced over the bony tip.  She maintained a DIP joint and  hopefully we will have good FDP excursion.  I did not see any signs of  __________  purulent flexor tenosynovitis.  Following this, I placed a  small amount of Sensorcaine 0.5% without epinephrine in the wound.  She  tolerated this well.  She was taken to recovery room in stable  condition.  I should note that we used greater than  3 liters of saline  for her I and D and then wound conditions were markedly improved and I  was pleased with the findings.  We will plan for continued IV  antibiotics, general postop observation, and pain control.  I have  discussed these issues at length.  She has complained off and on over  the past year of some GI complaints, I have discussed this with her at  great length.  Her LFTs are mildly elevated and due to these issues,  which had been very longstanding, we are going to plan for an ultrasound  during the course of her hospitalization to rule out abnormality.  I  have discussed this with the patient at length and all questions have  been encouraged and answered.      Dionne Ano. Amanda Pea, M.D.  Electronically Signed     WMG/MEDQ  D:  06/26/2008  T:  06/27/2008  Job:  [id number]

## 2011-01-26 ENCOUNTER — Other Ambulatory Visit: Payer: Self-pay | Admitting: Gastroenterology

## 2011-01-26 DIAGNOSIS — B1920 Unspecified viral hepatitis C without hepatic coma: Secondary | ICD-10-CM

## 2011-01-26 NOTE — Op Note (Signed)
NAME:  Amanda Bennett, Amanda Bennett                ACCOUNT NO.:  [id number]   MEDICAL RECORD NO.:  [id number]          PATIENT TYPE:  AMB   LOCATION:  NESC                         FACILITY:  [facility]   PHYSICIAN:  Almedia Balls. Ranell Patrick, M.D. DATE OF BIRTH:  1956/01/14   DATE OF PROCEDURE:  08/22/2004  DATE OF DISCHARGE:                                 OPERATIVE REPORT   PREOPERATIVE DIAGNOSIS:  Retained hardware, right clavicle (clavicle plate).   POSTOPERATIVE DIAGNOSIS:  Retained hardware, right clavicle (clavicle  plate).   PROCEDURE PERFORMED:  Hardware removal, right clavicle.   ATTENDING SURGEON:  Almedia Balls. Ranell Patrick, M.D.   ASSISTANT:  None.   ANESTHESIA:  General.   ESTIMATED BLOOD LOSS:  Minimal.   FLUID REPLACEMENT:  400 cc crystalloid.   INSTRUMENT COUNT:  Correct.   COMPLICATIONS:  None.   PREOPERATIVE ANTIBIOTICS:  Given.   INDICATION:  The patient is a 55 year old female status post right clavicle  fracture.  She was treated in the past with an open reduction internal  fixation using a plate.  The patient went on to heal her fracture as  documented by CT scan preoperatively but complains of prominent painful  hardware to the right clavicle.  She presents now for operative hardware  removal.   DESCRIPTION OF PROCEDURE:  After an adequate level of anesthesia achieved,  the patient was positioned in the modified beach chair position.  All  neurovascular structures were padded appropriately.  Right shoulder was  sterilely prepped and draped in the usual manner.  Utilizing the patient's  prior skin incision, we made a stress skin incision using 15-blade scalpel.  Dissection was carried sharply down through subcutaneous tissues using Bovie  electrocautery and the plate was easily identifiable, essentially  subcutaneously on the anterior border of the clavicle.  Small screw driver  was used to remove screws and the plate was easily removed using a Market researcher.  Fibrous ingrowth and  borders around the plate were removed using  a rongeur.  The bone was nice and smooth.  We irrigated  thoroughly and then closed in a layered closure with three separate layers  using 3-0 Vicryl suture for the platysma and trapezius reflections initially  followed by 2-0 for subcutaneous and a running 4-0 Monocryl for skin.  Steri-  Strips were applied and a sterile dressing.  The patient was taken to the  recovery room in stable condition.      SRN/MEDQ  D:  08/22/2004  T:  08/22/2004  Job:  [id number]

## 2011-01-26 NOTE — Op Note (Signed)
[place] [place]  Patient:    Amanda Bennett, Amanda Bennett                       MRN: [id number] Proc. Date: 05/22/00 Adm. Date:  [id number] Attending:  Marlowe Shores CC:         Artist Pais Weingold, M.D. (2)   Operative Report  PREOPERATIVE DIAGNOSIS:  Infected flexor sheath, right ring finger.  POSTOPERATIVE DIAGNOSIS:  Infected flexor sheath, right ring finger.  PROCEDURE:  Incision and drainage of flexor sheath, right ring finger, right hand.  SURGEON:  Artist Pais. Mina Marble, M.D.  ASSISTANT:  Junius Roads. Ireton, P.A.-C.  ANESTHESIA:  General.  TOURNIQUET TIME:  31 minutes.  COMPLICATIONS:  None.  DRAINS:  None.  TUBE:  One pediatric feeding tube left in for continuous flexor sheath irrigation.  CULTURES:  Two sent.  DESCRIPTION OF PROCEDURE:  The patient was taken to the operating room after the induction of adequate general anesthesia.  The right upper extremity was elevated, and then the tourniquet was inflated to 250 mmHg.  At this point in time a Brunner-type incision was made over the palmar aspect in the area of the A1 pulley ring finger on the right.  The incision was taken down through the skin and subcutaneous tissues until the flexor sheath was identified. There was purulent material seen coming from the flexor sheath.  This was cultured x 2 for both aerobic and anaerobic cultures.  At this point in time, after the proximal edge of the A1 pulley was identified, a small midlateral incision was made on the ulnar side of the ring finger, in the area of the DIP joint, and the flexor sheath was identified, and a small hole was made between the A5 and A4 pulleys.  Next, a #5 pediatric feeding tube was placed under the A1 pulley proximally, and irrigation was commenced.  Irrigation was done with antibiotic-impregnated solution.  A small hole was seen where the original cat bite was.  This was also extended using a knife, and thus two points  of distal irrigation were established.  The wound in the flexor sheath was then irrigated with one L of antibiotic-impregnated solution.  This was then followed by the application of a dressing after the wound closure with #5-0 nylon.  A bulky dressing was applied, as well as a dorsal splint, and again the pediatric feeding tube was left in for continuous 24-hour irrigation. DD:  05/23/00 TD:  05/23/00 Job: [id number]

## 2011-01-26 NOTE — Op Note (Signed)
[facility]  Patient:    Amanda Bennett, Amanda Bennett                       MRN: [id number] Proc. Date: 05/03/00 Adm. Date:  [id number] Disc. Date: [id number] Attending:  Orland Mustard CC:         Dyanne Carrel, M.D.                           Operative Report  PROCEDURE:  Colonoscopy.  MEDICATIONS:  Fentanyl 125 mcg, Versed 12.2 mg IV.  INDICATIONS:  Strongly positive family history of colon cancer in father. Maternal grandmother and grandfather and a paternal great grandfather all died of colon cancer.  Multiple aunts and uncles had colon cancer and colon polyps. Given this extraordinarily positive family history, colonoscopy is performed.  INSTRUMENT USED:  A pediatric video colonoscope.  DESCRIPTION OF PROCEDURE:  The procedure had been explained to the patient and consent obtained.  With the patient in the left lateral decubitus position, a digital examination was performed and the pediatric colonoscope was inserted and advanced under direct visualization.  The patient had extraordinarily positive tortuous colon with marked tortuosity.  Multiple maneuvers and position changes were required to advance.  Eventually with the patient in the right lateral decubitus position and abdominal pressure we were able advance to the cecum and ileocecal valve was seen.  The terminal ileum was entered for a short distance and was normal.  The scope was withdrawn.  The cecum and ascending colon, hepatic flexure, transverse colon, splenic flexure, descending, and sigmoid colon were seen well upon removal.  No polyps were seen.  Internal hemorrhoids were seen in the rectum upon removal of the scope. The scope was withdrawn.  The patient tolerated the procedure well. She was maintained on low-flow oxygen and pulse oximetry throughout the procedure with no obvious problem.  ASSESSMENT: 1. No evidence of colon polyps. 2. Internal hemorrhoids.  PLAN:  We will  recommend a five year repeat procedure. DD:  05/03/20 TD:  05/06/00 Job: [id number]

## 2011-01-26 NOTE — Discharge Summary (Signed)
NAME:  Amanda Bennett, Amanda Bennett                          ACCOUNT NO.:  [id number]   MEDICAL RECORD NO.:  [id number]                   PATIENT TYPE:  INP   LOCATION:  5707                                 FACILITY:  [facility]   PHYSICIAN:  Shawn Rayburn, P.A.                 DATE OF BIRTH:  01/04/1956   DATE OF ADMISSION:  06/28/2002  DATE OF DISCHARGE:                                 DISCHARGE SUMMARY   DISCHARGE DIAGNOSES:  1. Status post blunt pelvic trauma secondary to being stepped on by a horse.  2. Right non-displaced sacral fracture.  3. Left labial hematoma.  4. Large cystic ovarian masses bilaterally, to be followed up as an     outpatient.  5. Left knee injury, follow-up as an outpatient.   HISTORY OF PRESENT ILLNESS:  This is a 55 year old female who apparently was  riding a horse when she fell from the horse and then was stepped on by the  horse in her pelvic region. She was seen by trauma service and found to be  hemodynamically stable. Workup at this time including pelvic film showed  right sacral fracture. Chest x-ray is negative. She had an abdominal pelvic  CT scan which showed right sacral fracture and also bilateral adnexal  masses, felt to be ovarian, cystic in nature. The patient was admitted for  pain control with observation. She is seen in consult per Dr. Lajoyce Corners of  Orthopedics. She is felt to have a non-displaced distal right sacral  fracture with stable SI joint and left knee contusion. She was begun on  ambulation and weight bearing as tolerated with crutches and a sleeve type  of knee brace, as she felt her knee was somewhat unstable. It was felt that  she should have home health PT in follow-up and this was initiated. She was  also seen in consultation by Dr. Elana Alm her primary OB/GYN doctor and her  left labial contusion was treated with Sitz baths and local care. Ovarian  masses or adnexal masses are to be evaluated as an outpatient in follow-up  in his office. She  will follow-up with Dr. Elana Alm in one to two weeks and  Dr. Lajoyce Corners in three weeks and with Trauma Services.   DISCHARGE MEDICATIONS:  1. Percocet 5/325 mg one to two by mouth every four to six hours as needed     pain, #40 with no refill.  2. Phenergan 25 mg one by mouth twice a day as needed nausea.  3. Ambien 10 mg by mouth at bedtime as needed sleep, #15 with no refill.   ACTIVITY:  She is ambulatory with crutches and weight bearing as tolerated.   DIET:  Regular  Lazaro Arms, P.A.    SR/MEDQ  D:  07/02/2002  T:  07/03/2002  Job:  [id number]   cc:   Jimmye Norman III, M.D.  [address]  [place]  [place] [address]  Fax: [phone]   S. Kyra Manges, M.D.  [phone] [address]  [place]  [place] [address]  Fax: [phone]   Nadara Mustard, M.D.  [address]  [place]  [place] [address]  Fax: [phone]

## 2011-01-26 NOTE — Op Note (Signed)
Amanda Bennett, Amanda Bennett                ACCOUNT NO.:  [id number]   MEDICAL RECORD NO.:  [id number]          PATIENT TYPE:  AMB   LOCATION:  SDS                          FACILITY:  [facility]   PHYSICIAN:  Leonie Man, M.D.   DATE OF BIRTH:  [birth year]   DATE OF PROCEDURE:  11/25/2006  DATE OF DISCHARGE:                               OPERATIVE REPORT   PREOPERATIVE DIAGNOSIS:  Complex hemorrhoidal disease.   POSTOPERATIVE DIAGNOSIS:  Complex hemorrhoidal disease.   PROCEDURE:  Hemorrhoidectomy.   SURGEON:  Dr. Lurene Shadow.   ASSISTANT:  Dr. __________.   ANESTHESIA:  General.   POSITION:  Lithotomy.   ESTIMATED BLOOD LOSS:  Approximately 20 mL.   SPECIMENS TO LABORATORY:  Hemorrhoids.   NOTE:  Amanda Bennett is a 55 year old female presenting originally with  thrombosed hemorrhoids.  On further evaluation, the patient had rather  complex hemorrhoidal disease and requested hemorrhoidectomy.  She comes  to the operating room after risks and potential benefits of surgery have  been discussed.  All questions answered, consent obtained for same.   PROCEDURE:  Following the induction of satisfactory general anesthesia,  the patient is positioned in lithotomy.  We proceeded with  proctosigmoidoscopy.  This was aborted at 10 cm because of retained  stool within the rectum.  The peritoneal tissues and rectum and anus  were prepped and draped to be included in a sterile operative field and  the perianal tissues injected with 0.5% Marcaine with epinephrine.  Dilatation of the anal verge was carried out, and the operating anoscope  was placed into the rectum, and large complex hemorrhoidal tissues were  noted at the 10 o'clock, 2 o'clock and 6 o'clock axes of the anus.  Each  of these were approached serially by infiltrating the hemorrhoid with  0.5% Marcaine and placing a traction suture at the base of the  hemorrhoid.  The hemorrhoidal tissue was then outlined and dissected  free from the  underlying sphincter muscle, maintaining hemostasis  throughout the course of the dissection.  The hemorrhoidal defect was  then closed with a mucosal stitch in a running interlocking fashion down  to the mucocutaneous junction and continued with simple sutures over  across the mucocutaneous junction.  Similarly, hemorrhoid at 6 o'clock  and 2 o'clock was treated out.  Just approximately 6:30 to 7 o'clock,  there was a single small external hemorrhoid which was suture ligated  with 2-0 Vicryl suture.  Hemostasis was assured with electrocautery, and  I then placed a Gelfoam pack within the anus and left that in place.  I held pressure against the anal verge for approximately 1 minute to  assure hemostasis.  Sponge and instrument counts were verified.  The  anesthetic reversed.  The patient removed from the operating room after  sterile dressings were placed on the incision.      Leonie Man, M.D.  Electronically Signed     PB/MEDQ  D:  11/25/2006  T:  11/25/2006  Job:  [id number]

## 2011-01-26 NOTE — Discharge Summary (Signed)
NAMEADAYA, GARMANY                ACCOUNT NO.:  [id number]   MEDICAL RECORD NO.:  [id number]          PATIENT TYPE:  INP   LOCATION:  5001                         FACILITY:  [facility]   PHYSICIAN:  Dionne Ano. Gramig, M.D.DATE OF BIRTH:  1956-09-08   DATE OF ADMISSION:  06/24/2008  DATE OF DISCHARGE:  06/27/2008                               DISCHARGE SUMMARY   Madline Oesterling is a very pleasant female who underwent a near amputation  with attempted reconstruction.  The patient near completing amputation,  which gone on to wound declaration and infectious sequelae as well as  necrotic distal tip.  I saw the patient very closely in the  postoperative period and in 7 days postop, I felt that she had a  significant necrosis, which she declared to safe and recommended repeat  irrigation and debridement.  She was taken to the hospital and underwent  repeat irrigation and debridement with removal of necrotic tissue with  the distal tip of her finger.  This surgery was performed on June 24, 2008.  Subsequent to this, she was started on antibiotics to the  hospital and taken back to the surgical suite on June 24, 2008, where  wound conditions were generally improved and she was loosely closed.   Following loose closure, we continued IV antibiotics and close  observation.  The patient did ultimately grow out a species of  Pseudomonas.  With this noted, the patient had her antibiotics tailored  according to sensitivities.  The patient looked very good on June 27, 2008.   Her heart was regular rate.  Abdomen nontender.  Her chest was clear.  She was doing well, had resolution of her erythema about the finger and  was set for D&C.  She was discharged home on this date on Dilaudid 2 mg  1 q.4-6 h. p.r.n. pain p.o., Robaxin 500 mg one p.o. q. 6-8 h. p.r.n.  spasm, Cipro 500 mg one p.o. b.i.d., over-the-counter stool softener,  vitamin C 1000 mg a day, and will follow up in the next 2-3 days.   I  have discussed with Ms. Serena the [employer] manager.   She is overall doing well.  Unfortunately, she did have some significant  necrosis and infection that declared itself at the tip, however, this is  resolved nicely at the time of her discharge and serial debridements  have landed her healthy tissue, which will provide her with the distal  10 fingers for support in the future.  Her DIP joint does fire readily.   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      Dionne Ano. Amanda Pea, M.D.  Electronically Signed     WMG/MEDQ  D:  08/18/2008  T:  08/19/2008  Job:  [id number]
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
## 2011-02-12 ENCOUNTER — Ambulatory Visit (HOSPITAL_COMMUNITY): Payer: Self-pay

## 2011-02-12 ENCOUNTER — Other Ambulatory Visit: Payer: Self-pay | Admitting: Diagnostic Radiology

## 2011-02-12 ENCOUNTER — Ambulatory Visit (HOSPITAL_COMMUNITY)
Admission: RE | Admit: 2011-02-12 | Discharge: 2011-02-12 | Disposition: A | Discharge status: Home / Self Care | Payer: Self-pay | Source: Ambulatory Visit | Attending: Gastroenterology | Admitting: Gastroenterology

## 2011-02-12 DIAGNOSIS — F329 Major depressive disorder, single episode, unspecified: Secondary | ICD-10-CM | POA: Insufficient documentation

## 2011-02-12 DIAGNOSIS — F3289 Other specified depressive episodes: Secondary | ICD-10-CM | POA: Insufficient documentation

## 2011-02-12 DIAGNOSIS — Z90710 Acquired absence of both cervix and uterus: Secondary | ICD-10-CM | POA: Insufficient documentation

## 2011-02-12 DIAGNOSIS — Z79899 Other long term (current) drug therapy: Secondary | ICD-10-CM | POA: Insufficient documentation

## 2011-02-12 DIAGNOSIS — B1920 Unspecified viral hepatitis C without hepatic coma: Secondary | ICD-10-CM | POA: Insufficient documentation

## 2011-02-12 LAB — CBC
HCT: 40.9 % (ref 36.0–46.0)
Hemoglobin: 13.9 g/dL (ref 12.0–15.0)
MCH: 32.6 pg (ref 26.0–34.0)
MCHC: 34 g/dL (ref 30.0–36.0)
MCV: 95.8 fL (ref 78.0–100.0)
Platelets: 256 10*3/uL (ref 150–400)
RBC: 4.27 MIL/uL (ref 3.87–5.11)
RDW: 13.8 % (ref 11.5–15.5)
WBC: 7.6 10*3/uL (ref 4.0–10.5)

## 2011-02-12 LAB — PROTIME-INR
INR: 1 (ref 0.00–1.49)
Prothrombin Time: 13.4 seconds (ref 11.6–15.2)

## 2011-02-12 LAB — APTT: aPTT: 30 seconds (ref 24–37)

## 2011-02-15 ENCOUNTER — Encounter: Payer: Self-pay | Admitting: Gastroenterology

## 2011-02-21 ENCOUNTER — Ambulatory Visit: Payer: Self-pay | Admitting: Physical Medicine and Rehabilitation

## 2011-03-16 ENCOUNTER — Encounter: Payer: Self-pay | Attending: Physical Medicine and Rehabilitation | Admitting: Physical Medicine and Rehabilitation

## 2011-04-18 ENCOUNTER — Telehealth: Payer: Self-pay | Admitting: *Deleted

## 2011-04-18 ENCOUNTER — Other Ambulatory Visit: Payer: Self-pay | Admitting: Internal Medicine

## 2011-04-18 DIAGNOSIS — L989 Disorder of the skin and subcutaneous tissue, unspecified: Secondary | ICD-10-CM

## 2011-04-18 NOTE — Telephone Encounter (Signed)
Pt called asking for a dermatology referral for a node on her sternum.  She was seen on 5/8 for this but referral not done at that time. This node has enlarged and pt wants the referral.  Will you do the referral?

## 2011-04-18 NOTE — Telephone Encounter (Signed)
Referral done

## 2011-04-22 ENCOUNTER — Inpatient Hospital Stay (INDEPENDENT_AMBULATORY_CARE_PROVIDER_SITE_OTHER)
Admission: RE | Admit: 2011-04-22 | Discharge: 2011-04-22 | Disposition: A | Discharge status: Home / Self Care | Payer: Self-pay | Source: Ambulatory Visit | Attending: Family Medicine | Admitting: Family Medicine

## 2011-04-22 DIAGNOSIS — L02219 Cutaneous abscess of trunk, unspecified: Secondary | ICD-10-CM

## 2011-04-25 LAB — CULTURE, ROUTINE-ABSCESS

## 2011-04-27 ENCOUNTER — Encounter: Payer: Self-pay | Attending: Physical Medicine and Rehabilitation | Admitting: Physical Medicine and Rehabilitation

## 2011-04-27 DIAGNOSIS — X58XXXS Exposure to other specified factors, sequela: Secondary | ICD-10-CM | POA: Insufficient documentation

## 2011-04-27 DIAGNOSIS — IMO0002 Reserved for concepts with insufficient information to code with codable children: Secondary | ICD-10-CM | POA: Insufficient documentation

## 2011-04-27 DIAGNOSIS — M545 Low back pain, unspecified: Secondary | ICD-10-CM

## 2011-04-27 DIAGNOSIS — M531 Cervicobrachial syndrome: Secondary | ICD-10-CM

## 2011-04-27 DIAGNOSIS — G5600 Carpal tunnel syndrome, unspecified upper limb: Secondary | ICD-10-CM | POA: Insufficient documentation

## 2011-04-27 DIAGNOSIS — M79609 Pain in unspecified limb: Secondary | ICD-10-CM | POA: Insufficient documentation

## 2011-04-27 DIAGNOSIS — G8929 Other chronic pain: Secondary | ICD-10-CM | POA: Insufficient documentation

## 2011-04-27 DIAGNOSIS — M542 Cervicalgia: Secondary | ICD-10-CM

## 2011-04-27 DIAGNOSIS — S42309S Unspecified fracture of shaft of humerus, unspecified arm, sequela: Secondary | ICD-10-CM | POA: Insufficient documentation

## 2011-04-27 DIAGNOSIS — M1610 Unilateral primary osteoarthritis, unspecified hip: Secondary | ICD-10-CM

## 2011-04-27 DIAGNOSIS — M19049 Primary osteoarthritis, unspecified hand: Secondary | ICD-10-CM | POA: Insufficient documentation

## 2011-04-27 DIAGNOSIS — M25519 Pain in unspecified shoulder: Secondary | ICD-10-CM | POA: Insufficient documentation
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
## 2011-05-01 ENCOUNTER — Other Ambulatory Visit: Payer: Self-pay | Admitting: Internal Medicine

## 2011-05-01 ENCOUNTER — Encounter: Payer: Self-pay | Admitting: Internal Medicine

## 2011-05-01 MED ORDER — CLONAZEPAM 0.5 MG PO TABS: 0.5000 mg | ORAL_TABLET | Freq: Every day | ORAL | Status: DC | PRN

## 2011-05-01 MED ORDER — ESTROGENS CONJUGATED 0.625 MG PO TABS: 0.6250 mg | ORAL_TABLET | Freq: Every day | ORAL | Status: DC

## 2011-05-01 NOTE — Telephone Encounter (Signed)
Pt arrived for an appointment but had had 82-something mother waiting in the car upstairs.  Pt reschedule but would like a refill on her klonopin and premarin.Kingsley Spittle Cassady8/21/20125:01 PM

## 2011-05-15 ENCOUNTER — Encounter: Payer: Self-pay | Admitting: Internal Medicine

## 2011-05-15 ENCOUNTER — Ambulatory Visit (INDEPENDENT_AMBULATORY_CARE_PROVIDER_SITE_OTHER): Payer: Self-pay | Admitting: Internal Medicine

## 2011-05-15 DIAGNOSIS — L989 Disorder of the skin and subcutaneous tissue, unspecified: Secondary | ICD-10-CM

## 2011-05-15 DIAGNOSIS — Z23 Encounter for immunization: Secondary | ICD-10-CM

## 2011-05-15 DIAGNOSIS — M5030 Other cervical disc degeneration, unspecified cervical region: Secondary | ICD-10-CM

## 2011-05-15 MED ORDER — IBUPROFEN 800 MG PO TABS: 800.0000 mg | ORAL_TABLET | Freq: Three times a day (TID) | ORAL | Status: AC | PRN

## 2011-05-15 MED ORDER — KETOROLAC TROMETHAMINE 30 MG/ML IJ SOLN
15.0000 mg | Freq: Once | INTRAMUSCULAR | Status: AC
  Administered 2011-05-15: 15 mg via INTRAMUSCULAR

## 2011-05-15 NOTE — Assessment & Plan Note (Signed)
Pain is not well controlled.  She is being managed by pain clinic and has been given Vicodin; however, she cannot tolerate this medication.  They agreed to change to Percocet; however, due to miscommunication, she has not been able to pick up her Percocet.   -GaveToradol 15mg  IM x 1 today in clinic  -Will prescribe Ibuprofen 800mg  po q8hr prn #30 until she can get her narcotic prescription from pain clinic

## 2011-05-15 NOTE — Patient Instructions (Signed)
You can take Ibuprofen 800mg  one tablet every 8 hours as needed for pain Will get Hepatitis shot today, 1 month, 6 months> these will be nurse visit  Call me if your skin lesion becomes tender, red, or draining pus/fluid  Follow up with Dr. Anselm Jungling in 3-4 months

## 2011-05-15 NOTE — Assessment & Plan Note (Signed)
S/p lancing by urgent care about 3 weeks ago.  She has a 1cm healed scar on mid sternum area without any erythema, tenderness, or purulent drainage.  Healing very well.  Apparently she was told that the wound was staph but was not given any abx since it was improving.  Will continue to monitor,  If she develops tenderness or erythema or drainage, will start abx either Doxycycline or Clindamycin.

## 2011-05-15 NOTE — Progress Notes (Signed)
55 yo woman with past medical history of hepatitis C, depression, cervical disc disease, GERD who is here for low back pain and neck pain which is chronic.  She is currently being seen by pain clinic and was given Vicodin; however, she states that Vicodin is making her sick and that her pain doctor is supposed to change it to Percocet.  There was some miscommunication with the pain clinic so when she went to pick up her Rx, it was still vicodin.  She is complaining of increased in pain in neck and back because of the weather and has been taking over the counter Aleve which is not helping. She also states that she is being referred to surgeon for possible neck surgery.  She is awaiting for approval from medicaid.  -Her reports that her depression is much better, his mood is improved. She denies any suicidal or homicidal ideation. -In terms of her Hep C, she is not a candidate for the research at [facility] so she is applying to [facility] so that she can start therapy.  Dr. Jacqualine Mau is following her at the hepatology clinic. -She had a skin nodule that was lanced about 3 wks ago at an urgent care when it came more tender and fluctuant.  She was told that it was "staph" but was not given any antibiotics.  The wound healed very well with a scar on her midsternum.She denies any fever, chills, n/v. -She also wants hepatitis vaccination today.   ROS: as per HPI  PE: General: alert, thin appearing woman, and cooperative to examination.  Neck: supple, full ROM, no thyromegaly, no JVD, and no carotid bruits. Mild tenderness to palpation but no erythema or drainage.  Full ROM of neck, no nuchal rigidity Lungs: normal respiratory effort, no accessory muscle use, normal breath sounds, no crackles, and no wheezes. Heart: normal rate, regular rhythm, no murmur, no gallop, and no rub. Midsternum: healed scar about 1cm without any fluctuant, erythema, or purulent drainage Abdomen: soft, non-tender, normal bowel sounds, no distention,  no guarding, no rebound tenderness Msk: no joint swelling, no joint warmth, and no redness over joints. Mild back tenderness to palpation. Pulses: 2+ DP/PT pulses bilaterally Extremities: No cyanosis, clubbing, edema Neurologic: alert & oriented X3, cranial nerves II-XII intact, strength normal in all extremities, sensation intact to light touch, and gait normal.  Psych: Oriented X3, memory intact for recent and remote, normally interactive, good eye contact, not anxious appearing, and not depressed appearing.

## 2011-05-17 ENCOUNTER — Other Ambulatory Visit: Payer: Self-pay | Admitting: Internal Medicine

## 2011-05-17 DIAGNOSIS — Z1231 Encounter for screening mammogram for malignant neoplasm of breast: Secondary | ICD-10-CM

## 2011-05-17 NOTE — Progress Notes (Signed)
I have reviewed and discussed the care of this patient in detail with the resident physician including pertinent patient records, physical exam findings and data. I agree with details of this encounter.   

## 2011-05-28 ENCOUNTER — Encounter: Payer: Self-pay | Attending: Physical Medicine and Rehabilitation | Admitting: Physical Medicine and Rehabilitation

## 2011-05-28 DIAGNOSIS — M545 Low back pain, unspecified: Secondary | ICD-10-CM | POA: Insufficient documentation

## 2011-05-28 DIAGNOSIS — M19049 Primary osteoarthritis, unspecified hand: Secondary | ICD-10-CM | POA: Insufficient documentation

## 2011-05-28 DIAGNOSIS — Z79899 Other long term (current) drug therapy: Secondary | ICD-10-CM | POA: Insufficient documentation

## 2011-05-28 DIAGNOSIS — G5600 Carpal tunnel syndrome, unspecified upper limb: Secondary | ICD-10-CM | POA: Insufficient documentation

## 2011-05-28 DIAGNOSIS — M25519 Pain in unspecified shoulder: Secondary | ICD-10-CM | POA: Insufficient documentation

## 2011-05-28 DIAGNOSIS — M25559 Pain in unspecified hip: Secondary | ICD-10-CM | POA: Insufficient documentation

## 2011-05-28 DIAGNOSIS — M542 Cervicalgia: Secondary | ICD-10-CM

## 2011-05-28 DIAGNOSIS — M79609 Pain in unspecified limb: Secondary | ICD-10-CM

## 2011-05-28 DIAGNOSIS — G8929 Other chronic pain: Secondary | ICD-10-CM | POA: Insufficient documentation

## 2011-05-28 DIAGNOSIS — Z8781 Personal history of (healed) traumatic fracture: Secondary | ICD-10-CM | POA: Insufficient documentation
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
## 2011-06-01 ENCOUNTER — Other Ambulatory Visit: Payer: Self-pay | Admitting: *Deleted

## 2011-06-01 MED ORDER — CLONAZEPAM 0.5 MG PO TABS: 0.5000 mg | ORAL_TABLET | Freq: Every day | ORAL | Status: DC | PRN

## 2011-06-05 NOTE — Telephone Encounter (Signed)
Refill called to the [facility].

## 2011-06-07 ENCOUNTER — Other Ambulatory Visit: Payer: Self-pay | Admitting: Neurosurgery

## 2011-06-07 ENCOUNTER — Other Ambulatory Visit (HOSPITAL_COMMUNITY): Payer: Self-pay | Admitting: Neurosurgery

## 2011-06-07 DIAGNOSIS — M8580 Other specified disorders of bone density and structure, unspecified site: Secondary | ICD-10-CM

## 2011-06-07 DIAGNOSIS — M545 Low back pain, unspecified: Secondary | ICD-10-CM

## 2011-06-11 LAB — TISSUE CULTURE

## 2011-06-11 LAB — COMPREHENSIVE METABOLIC PANEL
ALT: 38 — ABNORMAL HIGH
ALT: 46 — ABNORMAL HIGH
AST: 49 — ABNORMAL HIGH
AST: 52 — ABNORMAL HIGH
Albumin: 3 — ABNORMAL LOW
Albumin: 4.2
Alkaline Phosphatase: 105
Alkaline Phosphatase: 156 — ABNORMAL HIGH
BUN: 4 — ABNORMAL LOW
BUN: 6
CO2: 27
CO2: 28
Calcium: 10.1
Calcium: 8.6
Chloride: 102
Chloride: 105
Creatinine, Ser: 0.48
Creatinine, Ser: 0.48
GFR calc Af Amer: >60
GFR calc Af Amer: >60
GFR calc non Af Amer: >60
GFR calc non Af Amer: >60
Glucose, Bld: 111 — ABNORMAL HIGH
Glucose, Bld: 81
Potassium: 3.7
Potassium: 4.9
Sodium: 139
Sodium: 141
Total Bilirubin: 0.3
Total Bilirubin: 0.7
Total Protein: 5.8 — ABNORMAL LOW
Total Protein: 8.4 — ABNORMAL HIGH

## 2011-06-11 LAB — GRAM STAIN

## 2011-06-11 LAB — AFB CULTURE WITH SMEAR (NOT AT ARMC): Acid Fast Smear: NONE SEEN

## 2011-06-11 LAB — ANAEROBIC CULTURE

## 2011-06-11 LAB — DIFFERENTIAL
Basophils Absolute: 0
Basophils Absolute: 0
Basophils Relative: 0
Basophils Relative: 1
Eosinophils Absolute: 0
Eosinophils Absolute: 0.1
Eosinophils Relative: 1
Eosinophils Relative: 3
Lymphocytes Relative: 17
Lymphocytes Relative: 29
Lymphs Abs: 1.2
Lymphs Abs: 1.4
Monocytes Absolute: 0.5
Monocytes Absolute: 0.7
Monocytes Relative: 11
Monocytes Relative: 8
Neutro Abs: 2.4
Neutro Abs: 5.8
Neutrophils Relative %: 56
Neutrophils Relative %: 73

## 2011-06-11 LAB — FUNGUS CULTURE W SMEAR
Fungal Smear: NONE SEEN
Fungal Smear: NONE SEEN

## 2011-06-11 LAB — CBC
HCT: 34.2 — ABNORMAL LOW
HCT: 41.3
Hemoglobin: 11.7 — ABNORMAL LOW
Hemoglobin: 14.1
MCHC: 34.1
MCHC: 34.3
MCV: 96.7
MCV: 97.2
Platelets: 209
Platelets: 264
RBC: 3.52 — ABNORMAL LOW
RBC: 4.27
RDW: 13.5
RDW: 13.6
WBC: 4.2
WBC: 7.9

## 2011-06-11 LAB — CULTURE, ROUTINE-ABSCESS

## 2011-06-12 ENCOUNTER — Encounter: Payer: Self-pay | Attending: Neurosurgery | Admitting: Neurosurgery

## 2011-06-12 DIAGNOSIS — X58XXXS Exposure to other specified factors, sequela: Secondary | ICD-10-CM | POA: Insufficient documentation

## 2011-06-12 DIAGNOSIS — G894 Chronic pain syndrome: Secondary | ICD-10-CM

## 2011-06-12 DIAGNOSIS — M545 Low back pain, unspecified: Secondary | ICD-10-CM | POA: Insufficient documentation

## 2011-06-12 DIAGNOSIS — S42309S Unspecified fracture of shaft of humerus, unspecified arm, sequela: Secondary | ICD-10-CM | POA: Insufficient documentation

## 2011-06-12 DIAGNOSIS — M542 Cervicalgia: Secondary | ICD-10-CM

## 2011-06-12 DIAGNOSIS — M25519 Pain in unspecified shoulder: Secondary | ICD-10-CM | POA: Insufficient documentation

## 2011-06-12 DIAGNOSIS — M25549 Pain in joints of unspecified hand: Secondary | ICD-10-CM

## 2011-06-12 DIAGNOSIS — G8929 Other chronic pain: Secondary | ICD-10-CM | POA: Insufficient documentation

## 2011-06-12 DIAGNOSIS — M79609 Pain in unspecified limb: Secondary | ICD-10-CM | POA: Insufficient documentation

## 2011-06-12 NOTE — Assessment & Plan Note (Signed)
This patient of Dr. Pamelia Hoit was seen for chronic pain in her pelvis, shoulders and she has some right foot and right hand pain from time to time.  She has received MRI of her cervical spine today that Dr. Venetia Maxon went over with her.  She states that he did not feel like there was any concern for the cervical spine, but he has MRI of the lumbar spine as well as bone density study and she will return to him to follow up with that.  She rates no change in her pain at 7, it is so sharp, burning, dull, stabbing, tingling and aching pain.  General activity level is 10. Pain is worse in the morning, evening and night.  General sleep patterns are poor.  Walking, bending, sitting and standing tend to aggravate. Rest, heat, pacing, medication and injections tend to help.  She can walk with a cane.  She does not climb steps.  She drives.  She can walk about 5 minutes at a time.  She is on disability.  REVIEW OF SYSTEMS:  Notable for difficulties described above as well as some weight loss, constipation, nausea, poor appetite, abdominal pain, limb swelling, coughing, wheezing, numbness, tremors, tingling, trouble walking, spasms, depression, anxiety and suicidal thoughts or aberrant behaviors.  Oswestry score is 70.  Last UDS was in April.  We will plan one for next month and was consistent.  Pill counts were consistent.  PAST MEDICAL HISTORY, SOCIAL HISTORY AND FAMILY HISTORY:  Unchanged.  PHYSICAL EXAMINATION:  VITAL SIGNS:  Her blood pressure is 140/80, pulse is 68, respirations 14 and O2 sats 98 on room air. EXTREMITIES:  Motor strength and sensation in the upper and lower extremities are intact. NEUROLOGIC:  Constitutionally, she is thin.  She is alert and oriented x3.  Today, her gait appears to be normal.  IMPRESSION: 1. Cervicalgia. 2. History of right clavicular fracture healed. 3. Chronic can pain, question bilateral carpal tunnel. 4. Chronic low back pain.  PLAN:  Today, we are  going to switch her from Percocet and oxycodone 5 mg 1 p.o. b.i.d. 60 with no refill.  She states she does not want the Tylenol due to her liver issues she has. Otherwise, her questions were encouraged and answered.  She will followup with Dr. Pamelia Hoit in 1 month.     Yu Cragun L. Blima Dessert Electronically Signed    RLW/MedQ D:  06/12/2011 11:24:56  T:  06/12/2011 23:31:29  Job #:  [id number]

## 2011-06-15 ENCOUNTER — Inpatient Hospital Stay (HOSPITAL_COMMUNITY): Admission: RE | Admit: 2011-06-15 | Payer: Self-pay | Source: Ambulatory Visit

## 2011-06-16 ENCOUNTER — Ambulatory Visit (HOSPITAL_COMMUNITY)
Admission: RE | Admit: 2011-06-16 | Discharge: 2011-06-16 | Disposition: A | Discharge status: Home / Self Care | Payer: Self-pay | Source: Ambulatory Visit | Attending: Neurosurgery | Admitting: Neurosurgery

## 2011-06-16 DIAGNOSIS — M47817 Spondylosis without myelopathy or radiculopathy, lumbosacral region: Secondary | ICD-10-CM | POA: Insufficient documentation

## 2011-06-20 ENCOUNTER — Ambulatory Visit (HOSPITAL_COMMUNITY): Payer: Self-pay

## 2011-06-27 ENCOUNTER — Ambulatory Visit (HOSPITAL_COMMUNITY)
Admission: RE | Admit: 2011-06-27 | Discharge: 2011-06-27 | Disposition: A | Discharge status: Home / Self Care | Payer: Self-pay | Source: Ambulatory Visit | Attending: Internal Medicine | Admitting: Internal Medicine

## 2011-06-27 ENCOUNTER — Ambulatory Visit (HOSPITAL_COMMUNITY)
Admission: RE | Admit: 2011-06-27 | Discharge: 2011-06-27 | Disposition: A | Discharge status: Home / Self Care | Payer: Self-pay | Source: Ambulatory Visit | Attending: Neurosurgery | Admitting: Neurosurgery

## 2011-06-27 DIAGNOSIS — Z1231 Encounter for screening mammogram for malignant neoplasm of breast: Secondary | ICD-10-CM

## 2011-06-27 DIAGNOSIS — M8580 Other specified disorders of bone density and structure, unspecified site: Secondary | ICD-10-CM

## 2011-06-27 DIAGNOSIS — Z13820 Encounter for screening for osteoporosis: Secondary | ICD-10-CM | POA: Insufficient documentation

## 2011-06-29 ENCOUNTER — Other Ambulatory Visit: Payer: Self-pay | Admitting: *Deleted

## 2011-07-02 MED ORDER — GABAPENTIN 300 MG PO CAPS: 300.0000 mg | ORAL_CAPSULE | Freq: Three times a day (TID) | ORAL | Status: DC

## 2011-07-02 NOTE — Progress Notes (Signed)
Addended by: Neomia Dear on: 07/02/2011 06:50 PM   Modules accepted: Orders

## 2011-07-03 NOTE — Telephone Encounter (Signed)
Refill faxed in.  

## 2011-07-05 ENCOUNTER — Other Ambulatory Visit: Payer: Self-pay | Admitting: *Deleted

## 2011-07-05 NOTE — Telephone Encounter (Signed)
I talked with pt and she has not been taking neurontin because she did not have the money.  Health dept can get this from her at no cost. She will start a lower dose and work up to the 300 mg tid.

## 2011-07-05 NOTE — Telephone Encounter (Signed)
I checked echart and the last discharge summary was in 08/18/2008 and it did not mention anything about Neurontin qd. She has been taking neurontin 300mg  tid for her bilateral carpal tunnel syndrome according to our records. Will continue neurontin 300mg  tid unless patient has side effects such as increased depression, suicidal, acute renal failure, or seizures. Please verify with patient if she has been taking it tid vs. Qdaily. Thanks.

## 2011-07-05 NOTE — Telephone Encounter (Signed)
Call from [facility] verifying dose of neurontin 300 mg 1 caps tid. They had a med list stating  neutontin  300mg   once a day  Please let me know which is correct. Return # [id number]

## 2011-07-05 NOTE — Telephone Encounter (Signed)
Can you change omeprazole to Nexium as health dept can get at better rate?

## 2011-07-09 ENCOUNTER — Other Ambulatory Visit: Payer: Self-pay | Admitting: Internal Medicine

## 2011-07-09 MED ORDER — CLONAZEPAM 0.5 MG PO TABS: 0.5000 mg | ORAL_TABLET | Freq: Every day | ORAL | Status: DC | PRN

## 2011-07-09 MED ORDER — ESOMEPRAZOLE MAGNESIUM 20 MG PO CPDR: 20.0000 mg | DELAYED_RELEASE_CAPSULE | Freq: Every day | ORAL | Status: DC

## 2011-07-09 NOTE — Telephone Encounter (Signed)
Med changed to Nexium and called in

## 2011-07-09 NOTE — Progress Notes (Signed)
Med called into [facility]

## 2011-07-11 ENCOUNTER — Encounter: Payer: Self-pay | Attending: Physical Medicine and Rehabilitation | Admitting: Physical Medicine and Rehabilitation

## 2011-07-11 DIAGNOSIS — M79609 Pain in unspecified limb: Secondary | ICD-10-CM

## 2011-07-11 DIAGNOSIS — M542 Cervicalgia: Secondary | ICD-10-CM

## 2011-07-11 DIAGNOSIS — G894 Chronic pain syndrome: Secondary | ICD-10-CM

## 2011-07-11 DIAGNOSIS — M47812 Spondylosis without myelopathy or radiculopathy, cervical region: Secondary | ICD-10-CM | POA: Insufficient documentation

## 2011-07-11 DIAGNOSIS — M545 Low back pain, unspecified: Secondary | ICD-10-CM

## 2011-07-11 DIAGNOSIS — G5600 Carpal tunnel syndrome, unspecified upper limb: Secondary | ICD-10-CM | POA: Insufficient documentation

## 2011-07-11 DIAGNOSIS — S42309S Unspecified fracture of shaft of humerus, unspecified arm, sequela: Secondary | ICD-10-CM | POA: Insufficient documentation

## 2011-07-11 DIAGNOSIS — M19049 Primary osteoarthritis, unspecified hand: Secondary | ICD-10-CM | POA: Insufficient documentation

## 2011-07-11 DIAGNOSIS — IMO0002 Reserved for concepts with insufficient information to code with codable children: Secondary | ICD-10-CM | POA: Insufficient documentation

## 2011-07-11 DIAGNOSIS — G8929 Other chronic pain: Secondary | ICD-10-CM | POA: Insufficient documentation

## 2011-07-11 NOTE — Telephone Encounter (Signed)
[facility]

## 2011-07-11 NOTE — Telephone Encounter (Signed)
Med called in

## 2011-07-12 NOTE — Assessment & Plan Note (Signed)
[name] is a pleasant 55 year old woman who is followed at the [facility] for chronic pain complaints that are related predominantly to her neck and Bennett back.  She also has some hand complaints, which are not as much of a problem at this time.  She was last seen by me on May 28, 2011.  She has also had a visit with the nurse practitioner, on June 12, 2011.  She has a history of pelvic fracture back in 2006 after being trampled by a horse.  She was thrown from a horse in 2002 and sustained a clavicular fracture and has had chronic cervicalgia since that time as well.  She recently followed up with Dr. Venetia Maxon from [facility].  A note is attached to the chart today regarding her neck problems.  His note reflects  that her cervical imaging studies were fairly unremarkable without evidence of myelopathy or radiculopathy and surgical intervention is not warranted at this time.  She does have some lumbar problems and he has ordered some lumbar imaging studies. Apparently, [name] will follow back up with him after these are completed.  [name] is back in today for refill of her pain.  Her average pain is between 6 and 7 on a scale of 10, described as sharp, burning, dull, stabbing, and aching in nature.  Pain is worse in the morning, evening, and night.  Pain is worse with walking, bending, sitting, and activity.  Improves with rest, therapy, pacing her activities, medication, and injections.  She gets relatively good relief with current meds.  FUNCTIONAL STATUS:  She can walk about 10 minutes at a time.  She is independent with self-care.  REVIEW OF SYSTEMS:  Negative for problems with bowel or bladder.  Admits to depression, anxiety.  Review of systems positive for weight loss, nausea, constipation, abdominal pain, poor appetite, respiratory infections, limb swelling, coughing, shortness breath, wheezing.  PAST  MEDICAL HISTORY:  Otherwise unchanged.  SOCIAL HISTORY:  Otherwise unchanged.  FAMILY HISTORY:  Otherwise unchanged.  PHYSICAL EXAMINATION:  Her blood pressure is 130/70, pulse 88, respirations 16, 97% saturated on room air.  She is a thin adult female who does not appear in any distress.  She is oriented x3.  Speech is clear.  Affect is bright.  She is alert, cooperative, and pleasant. Follows commands without difficulty.  Answers my questions appropriately.  Cranial nerves, coordination are grossly intact.  Her reflexes are 2+ at patellar tendons, 0 at the right Achilles tendon, 2+ at the left Achilles tendon.  No abnormal tone or clonus is noted. Motor strength is generally good in both lower extremities.  She reports decreased sensation over the dorsum of the right foot.  Transitions from sitting to standing without difficulty.  Gait is antalgic.  Limitations in lumbar motion in all planes.  She has limited cervical range of motion, especially with rotation left.  IMPRESSION: 1. Cervicalgia with known cervical spondylosis. 2. Lumbago with right lower extremity pain. 3. History of right clavicular fracture. 4. Chronic hand pain with bilateral carpal tunnel syndrome,     significant osteoarthritis of the right carpometacarpal joint,     somewhat improved with the use of night splint currently.  PLAN: 1. Refilled her pain medication, gabapentin 400 mg 1 p.o. q.i.d.,     #120.  She finds gabapentin is quite beneficial.  She had     difficulty getting her last prescription and noted worsening of her  pain overall. 2. Oxycodone 5 mg 1 p.o. b.i.d., #60. 3. She continues to take her medications as prescribed.  No evidence     of aberrant behavior is observed.  She reviewed the opioid consent     today and has signed and I have reviewed the risks and benefits of     using opioid therapy with her again. 4. At this time, she is comfortable with our treatment plan.  She does      have an appointment to follow up with Dr. Venetia Maxon to review the MRI.     We will see her back.  I have answered all her questions.     Brantley Stage, M.D. Electronically Signed    DMK/MedQ D:  07/11/2011 14:53:20  T:  07/12/2011 03:55:52  Job #:  [id number]

## 2011-08-08 ENCOUNTER — Encounter: Payer: Medicaid Other | Attending: Neurosurgery | Admitting: Neurosurgery

## 2011-08-08 DIAGNOSIS — G894 Chronic pain syndrome: Secondary | ICD-10-CM

## 2011-08-08 DIAGNOSIS — G8929 Other chronic pain: Secondary | ICD-10-CM | POA: Insufficient documentation

## 2011-08-08 DIAGNOSIS — M545 Low back pain, unspecified: Secondary | ICD-10-CM

## 2011-08-08 DIAGNOSIS — M542 Cervicalgia: Secondary | ICD-10-CM

## 2011-08-08 DIAGNOSIS — M79609 Pain in unspecified limb: Secondary | ICD-10-CM | POA: Insufficient documentation

## 2011-08-08 NOTE — Assessment & Plan Note (Signed)
Patient of Dr. Pamelia Hoit seen for chronic back pain as well as some hand and neck pain.  She reports no change in her pain, it is 6 to an 8. It is a sharp, burning, dull, stabbing, tingling, aching pain.  General activity level is 9-10.  Pain is same 24 hours a day.  Sleep patterns are poor.  All activities aggravate.  Rest, heat, medication, and injections help.  She walks with and without assistance.  She does not climb steps.  She does drive.  She is on disability.  REVIEW OF SYSTEMS:  Notable for difficulties described above as well as some night sweats, weight loss, easy bleeding, GI issues, cough, paresthesias, dizziness, spasms, confusion, depression, anxiety, suicidal thoughts, which she was encouraged to see her primary care about her though she states she has no plan.  PAST MEDICAL HISTORY:  Unchanged.  SOCIAL HISTORY:  Unchanged.  FAMILY HISTORY:  Unchanged.  PHYSICAL EXAMINATION:  Blood pressure is 138/88, pulse 76, respirations 18, O2 sats 100% on room air.  Motor strength and sensation intact. Constitutionally, she is thin.  She is alert and oriented x3.  She has normal gait.  RADIOGRAPHS:  I did look at her MRI report of lumbar spine.  She has already discussed with Dr. Venetia Maxon, basically some mild spondylosis and normal MRI.  She stated understanding.  She did state that they did a bone density test and he recommend that someone started her on something for bone density structure and referred her to her primary care.  Again she understood.  IMPRESSION: 1. Cervicalgia. 2. Lumbago. 3. History of right clavicular fracture with surgical repair.  PLAN:  Refill oxycodone 5 mg 1 p.o. b.i.d., 60 with no refill.  Again, she was deferred to her primary care for the bone density medications. Her questions were encouraged and answered.  We will see her in a month.     Oreste Majeed L. Blima Dessert Electronically Signed    RLW/MedQ D:  08/08/2011 13:50:43  T:  08/08/2011  23:38:00  Job #:  [id number]

## 2011-09-06 ENCOUNTER — Encounter: Payer: Medicaid Other | Attending: Neurosurgery | Admitting: Neurosurgery

## 2011-09-06 DIAGNOSIS — M545 Low back pain, unspecified: Secondary | ICD-10-CM

## 2011-09-06 DIAGNOSIS — M79609 Pain in unspecified limb: Secondary | ICD-10-CM | POA: Insufficient documentation

## 2011-09-06 DIAGNOSIS — M542 Cervicalgia: Secondary | ICD-10-CM

## 2011-09-06 DIAGNOSIS — X58XXXS Exposure to other specified factors, sequela: Secondary | ICD-10-CM | POA: Insufficient documentation

## 2011-09-06 DIAGNOSIS — S42309S Unspecified fracture of shaft of humerus, unspecified arm, sequela: Secondary | ICD-10-CM | POA: Insufficient documentation

## 2011-09-06 DIAGNOSIS — G8929 Other chronic pain: Secondary | ICD-10-CM | POA: Insufficient documentation

## 2011-09-07 NOTE — Assessment & Plan Note (Signed)
This is a patient of Dr. Leretha Dykes seen for chronic low back pain and neck and hand pain.  She does currently have the flu.  She reports no change in her pain at a 7.  It is sharp, burning, dull, stabbing, aching pain.  General activity level is 10.  Pain is worse in the morning, evening, and night.  Sleep patterns are poor.  Pain is worse with walking, sitting, activity, and standing.  Rest, therapy, medication, and injections help.  She uses a cane at times.  She does not climb steps.  She does drive.  She is on disability and needs some help with household duties.  REVIEW OF SYSTEMS:  Notable for the difficulties as described above as well as some night sweats, weight loss, easy bleeding, diarrhea, constipation, nausea, abdominal pain, poor appetite, coughing, respiratory infections, shortness of breath, wheezing, numbness, tingling, dizziness, depression, anxiety.  No aberrant behaviors.  Last pill count and UDS consistent.  She has marked some over suicidal thoughts.  She is encouraged to follow up with her primary care regarding any kind of depression she has.  PAST MEDICAL HISTORY/SOCIAL HISTORY/FAMILY HISTORY:  Unchanged.  PHYSICAL EXAMINATION:  VITAL SIGNS:  Blood pressure is 141/70, pulse 69, respirations 14, and O2 sats 98 on room air. NEUROLOGIC:  Motor strength and sensation are intact. CONSTITUTIONAL:  She is thin.  She is oriented x3.  She has normal gait.  IMPRESSION: 1. Cervicalgia. 2. Lumbago. 3. History of right clavicular fracture, surgical repair.  PLAN:  Refill oxycodone 5 mg 1 p.o. b.i.d., #60 with no refill.  Her questions are encouraged and answered.  She will see Dr. Pamelia Hoit in a month.     Kazumi Lachney L. Blima Dessert Electronically Signed    RLW/MedQ D:  09/06/2011 15:23:20  T:  09/07/2011 07:01:01  Job #:  [id number]

## 2011-09-12 ENCOUNTER — Other Ambulatory Visit: Payer: Self-pay | Admitting: *Deleted

## 2011-09-12 MED ORDER — CLONAZEPAM 0.5 MG PO TABS: 0.5000 mg | ORAL_TABLET | Freq: Every day | ORAL | Status: DC | PRN

## 2011-09-12 NOTE — Telephone Encounter (Signed)
Refill for Klonopin was called to the [facility].  Angelina Ok, RN 09/12/2011 3:03 PM.

## 2011-09-19 ENCOUNTER — Encounter: Payer: Self-pay | Admitting: Internal Medicine

## 2011-09-25 ENCOUNTER — Encounter: Payer: Self-pay | Admitting: Internal Medicine

## 2011-10-02 ENCOUNTER — Ambulatory Visit (INDEPENDENT_AMBULATORY_CARE_PROVIDER_SITE_OTHER): Payer: Medicaid Other | Admitting: Internal Medicine

## 2011-10-02 ENCOUNTER — Encounter: Payer: Self-pay | Admitting: Internal Medicine

## 2011-10-02 DIAGNOSIS — R7401 Elevation of levels of liver transaminase levels: Secondary | ICD-10-CM

## 2011-10-02 DIAGNOSIS — K8020 Calculus of gallbladder without cholecystitis without obstruction: Secondary | ICD-10-CM | POA: Insufficient documentation

## 2011-10-02 DIAGNOSIS — L989 Disorder of the skin and subcutaneous tissue, unspecified: Secondary | ICD-10-CM

## 2011-10-02 DIAGNOSIS — F329 Major depressive disorder, single episode, unspecified: Secondary | ICD-10-CM

## 2011-10-02 DIAGNOSIS — M810 Age-related osteoporosis without current pathological fracture: Secondary | ICD-10-CM | POA: Insufficient documentation

## 2011-10-02 MED ORDER — CALCIUM CARB-CHOLECALCIFEROL 600-1000 MG-UNIT PO TABS: 1.0000 | ORAL_TABLET | Freq: Every day | ORAL | Status: DC

## 2011-10-02 MED ORDER — DULOXETINE HCL 20 MG PO CPEP: 20.0000 mg | ORAL_CAPSULE | Freq: Every day | ORAL | Status: DC

## 2011-10-02 MED ORDER — ALENDRONATE SODIUM 70 MG PO TABS: 70.0000 mg | ORAL_TABLET | ORAL | Status: DC

## 2011-10-02 NOTE — Assessment & Plan Note (Signed)
She is interested in switching to Cymbalta to help with her depression as well as her neuropathic pain.  She states that Celexa does help but wanted to try this new medication.  I explained all the side effects of Cymbalta and she verbalized her understanding and will need to call the office should her symptoms get worse. -Stop Celexa -Start Cymbalta 20mg  qd, will titrate up if her symptoms are not improved -Taper off Klonopin given the abuse/dependence potential and her hx of LFTs and Hep C. -Will follow up with Dr. Anselm Jungling in 3-4 weeks to reassess.

## 2011-10-02 NOTE — Assessment & Plan Note (Signed)
Hep C.  She had hep B vaccine last office visit but did not follow up for the 1 month shot, therefore, will restart the series today.  She has not followed up with Dr. Jacqualine Mau because she said they have not called her.  She does have medicaid now and would like to see another hepatologist once her other issues such as gallstones and depression are resolved.   -will refer to Hepatologist in near future.

## 2011-10-02 NOTE — Progress Notes (Signed)
HPI: Patient is a 56 yo W with PMH of depression, carpal tunnel syndrome, GERD, Hep C presents today for follow up.  She has abdominal pain in the right upper quadrant that radiates to her right flank for several months already.  She endorse diarrhea, nausea, heart burn, dull aching pain.  Denies any fever or chills.  Exacerbating factor include eating especially with greasy food and milk.  She does have a history of gallstones seen on previous Abd U/S in 10/2011and would like to be referred to general surgery for cholecystectomy since she is symptomatic.  Bone density- She had bone density which showed osteopenia/osteoporosis with a T score of -2.5  And wants to know if she needs calcium and Vit D.  As for her depression, it has been stable but she wants to try Cmmbalta instead of Celexa to help with her neuropathic pain. Although, Celexa has been helping her and that she is gaining weight which she is very happy about.  She states that she needs to take Ambien or Klonopin occasional for insomnia but is willing to taper off Klonopin given her liver disease. She is also trying to taper off Neurontin as well since taking 400mg  QID is making her drowsy.  She tells me that she has been taking 2 times daily instead  She has a lesion/wound on right forearm x 1 month in duration with scab after she bumped into her kitchen counter. She states that it was sore at first, but denies drainage, pruritis.  She has been applying medicated ointment on it with mild relief.  She does have a dermatologist in the past and would like to be referred back to see Dr. Londell Moh since she now has medicaid.  Hep B shot- She had 1 shot in September but has not followed up for the rest of the series   She is scheduled to get Colonoscopy 10/22/2011.  ROS: as per HPI  PE: General: alert, well-developed, and cooperative to examination.  Lungs: normal respiratory effort, no accessory muscle use, normal breath sounds, no crackles, and  no wheezes. Heart: normal rate, regular rhythm, no murmur, no gallop, and no rub.  Abdomen: soft, tender to palpation of RUQ with + Murphy's sign, normal bowel sounds, no distention, no guarding, no rebound tenderness. Skin: turgor normal.  Right forearm on lateral aspect: 1.5 x 1.5cm circular lesion with pink border granulation tissue and induration and cauliflower appearance.  Nontender, no discharge.   Psych: Oriented X3, memory intact for recent and remote, normally interactive, good eye contact, slightly anxious appearing, and not depressed appearing.

## 2011-10-02 NOTE — Assessment & Plan Note (Addendum)
1.5 x 1.5 cm lesion with induration with surrounding erythema with cauliflower-like appearance/scabbing.  Non tender, non-pruritic.  I am not sure what is the etiology but it does not appear to be infectious.  With the cauliflower appearance, it is likely a wart. -Will refer her to her previous dermatology for further eval and treatment -Pt has appointment with Dr. Londell Moh in March

## 2011-10-02 NOTE — Assessment & Plan Note (Addendum)
Her Bone Density in 06/2011 indicates that she has osteopenia/osteoporosis with T score of negative 2.5 -Will start Fosamax and Calcium + VitD given her risks especially frequent fall.  She does have GERD and is currently on treatment, denies any ulcers. With Fosamax,patient was instructed to be upright at least 30 mins after taking the pill.

## 2011-10-02 NOTE — Patient Instructions (Signed)
Follow up with Dr. Anselm Jungling in 3-4 weeks Stop taking Celexa Start tapering off Klonopin Start taking Cymbalta 20mg  one tablet at night Get abdominal ultrasound for gallstones

## 2011-10-02 NOTE — Assessment & Plan Note (Signed)
Hx of gallstone seen on [place] in 2011.  Signs and symptoms consistent with cholecystitis/cholithiasis.   -Will get repeat abd u/s  -refer to general surgeon for elective cholecystectomy

## 2011-10-02 NOTE — Progress Notes (Signed)
Pt aware [place] [facility] 10/03/11 7AM - NPO after MN. Nicci Rainah Kirshner RN 10/02/11 3:30PM

## 2011-10-03 ENCOUNTER — Encounter (INDEPENDENT_AMBULATORY_CARE_PROVIDER_SITE_OTHER): Payer: Self-pay | Admitting: Surgery

## 2011-10-03 ENCOUNTER — Ambulatory Visit (INDEPENDENT_AMBULATORY_CARE_PROVIDER_SITE_OTHER): Payer: Medicaid Other | Admitting: Surgery

## 2011-10-03 ENCOUNTER — Ambulatory Visit (HOSPITAL_COMMUNITY)
Admission: RE | Admit: 2011-10-03 | Discharge: 2011-10-03 | Disposition: A | Discharge status: Home / Self Care | Payer: Medicaid Other | Source: Ambulatory Visit | Attending: Internal Medicine | Admitting: Internal Medicine

## 2011-10-03 ENCOUNTER — Other Ambulatory Visit: Payer: Self-pay | Admitting: *Deleted

## 2011-10-03 VITALS — BP 122/80 | HR 74 | Temp 98.2°F | Ht 64.0 in | Wt 115.2 lb

## 2011-10-03 DIAGNOSIS — K8020 Calculus of gallbladder without cholecystitis without obstruction: Secondary | ICD-10-CM

## 2011-10-03 DIAGNOSIS — K8010 Calculus of gallbladder with chronic cholecystitis without obstruction: Secondary | ICD-10-CM | POA: Insufficient documentation

## 2011-10-03 MED ORDER — ESTROGENS CONJUGATED 0.625 MG PO TABS: 0.6250 mg | ORAL_TABLET | Freq: Every day | ORAL | Status: DC

## 2011-10-03 NOTE — Progress Notes (Signed)
Addended by: Wynona Luna on: 10/03/2011 11:08 AM   Modules accepted: Orders

## 2011-10-03 NOTE — Progress Notes (Signed)
Patient ID: Amanda Bennett, female   DOB: 05/06/1956, 55 y.o.   MRN: [id number]  Chief Complaint  Patient presents with  . Pre-op Exam    eval gb    HPI Amanda Bennett is a 55 y.o. female.  Referred by Dr. Michelle Bennett for gallstones HPI 55 yo female presents with a one month history of RUQ abdominal pain, nausea, vomiting, diarrhea, bloating.  Her symptoms are worse after eating, especially greasy foods.  She had an ultrasound this morning which showed gallstones, but no sign of cholecystitis.  CBD was normal.  She presents now for surgical evaluation. Past Medical History  Diagnosis Date  . Foot fracture, right 07/2010    per x-ray 07/2010 - oblique comminuted fifth metatarsal fracture, followed by Dr. Copland  . Depression   . Insomnia   . GERD (gastroesophageal reflux disease)   . Carpal tunnel syndrome   . Cervical spinal stenosis 2008    per x-ray findings 2008  . Pulmonary nodule, right     RUL nodule, 7 mm noted on Chest XRAY - 06/2008, per CT follow up in 2009 - No suspicious pulmonary nodules or masse noted  . Cervicalgia   . Pain in limb   . Lumbago   . Chronic pain syndrome   . Cervical syndrome   . Primary localized osteoarthrosis, pelvic region and thigh   . Lesion of ulnar nerve   . Anxiety   . Osteoporosis   . Unexplained weight loss   . Visual disturbance   . Cough   . Abdominal pain   . Nausea, vomiting and diarrhea   . Arthritis pain   . Bruises easily   . Wound disruption, post-op, skin     Past Surgical History  Procedure Date  . Abdominal hysterectomy   . Finger amputated     right ring  . Drain in left ring finger     cat bite  . Broken clavical     Family History  Problem Relation Age of Onset  . Stroke Neg Hx   . Cancer Father     colon/prostate  . Cancer Paternal Grandmother     ovarian    Social History History  Substance Use Topics  . Smoking status: Current Everyday Smoker -- 0.5 packs/day    Types: Cigarettes    Last Attempt  to Quit: 01/08/2011  . Smokeless tobacco: Not on file  . Alcohol Use: No    Allergies  Allergen Reactions  . Morphine And Related Shortness Of Breath and Itching  . Prozac (Fluoxetine Hcl)     Current Outpatient Prescriptions  Medication Sig Dispense Refill  . albuterol (PROVENTIL HFA;VENTOLIN HFA) 108 (90 BASE) MCG/ACT inhaler Inhale 1 puff into the lungs 2 (two) times daily.      . alendronate (FOSAMAX) 70 MG tablet Take 1 tablet (70 mg total) by mouth every 7 (seven) days. Take with a full glass of water on an empty stomach.  4 tablet  11  . Calcium Carb-Cholecalciferol 600-1000 MG-UNIT TABS Take 1 tablet by mouth daily.  30 tablet  11  . clonazePAM (KLONOPIN) 0.5 MG tablet Take 1 tablet (0.5 mg total) by mouth daily as needed.  30 tablet  0  . DULoxetine (CYMBALTA) 20 MG capsule Take 1 capsule (20 mg total) by mouth daily.  30 capsule  11  . esomeprazole (NEXIUM) 20 MG capsule Take 1 capsule (20 mg total) by mouth daily.  30 capsule  11  . estrogens, conjugated, (  PREMARIN) 0.625 MG tablet Take 1 tablet (0.625 mg total) by mouth daily. Take 0.625 mg by mouth daily.  30 tablet  3  . gabapentin (NEURONTIN) 400 MG capsule Take 400 mg by mouth every 6 (six) hours.      . oxyCODONE (OXY IR/ROXICODONE) 5 MG immediate release tablet Take 5 mg by mouth 2 (two) times daily.      . zolpidem (AMBIEN) 10 MG tablet Take 10 mg by mouth at bedtime as needed.         Review of Systems Review of Systems  Constitutional: Positive for unexpected weight change. Negative for fever and chills.  HENT: Negative for hearing loss, congestion, sore throat, trouble swallowing and voice change.   Eyes: Negative for visual disturbance.  Respiratory: Positive for cough. Negative for wheezing.   Cardiovascular: Negative for chest pain, palpitations and leg swelling.  Gastrointestinal: Positive for nausea, vomiting, abdominal pain, diarrhea and abdominal distention. Negative for constipation, blood in stool and  anal bleeding.  Genitourinary: Negative for hematuria, vaginal bleeding and difficulty urinating.  Musculoskeletal: Positive for arthralgias.  Skin: Negative for rash and wound.  Neurological: Negative for seizures, syncope and headaches.  Hematological: Negative for adenopathy. Does not bruise/bleed easily.  Psychiatric/Behavioral: Negative for confusion.    Blood pressure 122/80, pulse 74, temperature 98.2 F (36.8 C), temperature source Temporal, height 5' 4" (1.626 m), weight 115 lb 3.2 oz (52.254 kg), last menstrual period 10/23/2002, SpO2 98.00%.  Physical Exam Physical Exam WDWN in NAD HEENT:  EOMI, sclera anicteric Neck:  No masses, no thyromegaly Lungs:  CTA bilaterally; normal respiratory effort CV:  Regular rate and rhythm; no murmurs Abd:  +bowel sounds, soft,tender in RUQ radiating around to back Ext:  Well-perfused; no edema Skin:  Warm, dry; no sign of jaundice  Data Reviewed Ultrasound  Assessment    Chronic calculus cholecystitis    Plan    Laparoscopic cholecystectomy with intraoperative cholangiogram.  The surgical procedure has been discussed with the patient.  Potential risks, benefits, alternative treatments, and expected outcomes have been explained.  All of the patient's questions at this time have been answered.  The likelihood of reaching the patient's treatment goal is good.  The patient understand the proposed surgical procedure and wishes to proceed.        Amanda Bennett K. 10/03/2011, 11:00 AM    

## 2011-10-03 NOTE — Patient Instructions (Signed)
We will schedule your surgery today. 

## 2011-10-03 NOTE — Telephone Encounter (Signed)
rx faxed in 

## 2011-10-05 ENCOUNTER — Encounter: Payer: Medicaid Other | Admitting: Physical Medicine and Rehabilitation

## 2011-10-08 ENCOUNTER — Ambulatory Visit (AMBULATORY_SURGERY_CENTER): Payer: Medicaid Other | Admitting: *Deleted

## 2011-10-08 VITALS — Ht 64.0 in | Wt 115.2 lb

## 2011-10-08 DIAGNOSIS — Z1211 Encounter for screening for malignant neoplasm of colon: Secondary | ICD-10-CM

## 2011-10-08 MED ORDER — PEG-KCL-NACL-NASULF-NA ASC-C 100 G PO SOLR: ORAL | Status: DC

## 2011-10-22 ENCOUNTER — Ambulatory Visit (AMBULATORY_SURGERY_CENTER): Payer: Medicaid Other | Admitting: Internal Medicine

## 2011-10-22 ENCOUNTER — Encounter: Payer: Self-pay | Admitting: Internal Medicine

## 2011-10-22 ENCOUNTER — Encounter (HOSPITAL_COMMUNITY): Payer: Self-pay | Admitting: Pharmacy Technician

## 2011-10-22 VITALS — BP 140/76 | HR 62 | Temp 97.4°F | Resp 18 | Ht 64.0 in | Wt 115.0 lb

## 2011-10-22 DIAGNOSIS — Z1211 Encounter for screening for malignant neoplasm of colon: Secondary | ICD-10-CM

## 2011-10-22 DIAGNOSIS — Z800 Family history of malignant neoplasm of digestive organs: Secondary | ICD-10-CM

## 2011-10-22 DIAGNOSIS — K649 Unspecified hemorrhoids: Secondary | ICD-10-CM

## 2011-10-22 MED ORDER — SODIUM CHLORIDE 0.9 % IV SOLN: 500.0000 mL | INTRAVENOUS | Status: DC

## 2011-10-22 NOTE — Progress Notes (Signed)
Addended by: Maple Hudson on: 10/22/2011 12:26 PM   Modules accepted: Level of Service

## 2011-10-22 NOTE — Progress Notes (Signed)
1153 BP 181/63, PULSE 63. ASYMPTOMATIC. PT. STATES HER BLOOD PRESSURE "BOUNCES AROUND".

## 2011-10-22 NOTE — Patient Instructions (Addendum)
There were hemorrhoids but no polyps or cancer were seen. Next routine colonoscopy in about 5 years. Amanda Boop, MD, [facility] FOLLOW DISCHARGE INSTRUCTIONS [facility] AND GREEN SHEETS).Marland Kitchen

## 2011-10-22 NOTE — Progress Notes (Signed)
Patient did not experience any of the following events: a burn prior to discharge; a fall within the facility; wrong site/side/patient/procedure/implant event; or a hospital transfer or hospital admission upon discharge from the facility. (G8907) Patient did not have preoperative order for IV antibiotic SSI prophylaxis. (G8918)  

## 2011-10-22 NOTE — Op Note (Signed)
[facility] [address] [place], [place]  [address]  COLONOSCOPY PROCEDURE REPORT  PATIENT:  Amanda, Bennett  MR#:  [id number] BIRTHDATE:  1956/07/06, 55 yrs. old  GENDER:  female ENDOSCOPIST:  Iva Boop, MD, [facility]  PROCEDURE DATE:  10/22/2011 PROCEDURE:  Colonoscopy 19147 ASA CLASS:  Class II INDICATIONS:  Elevated Risk Screening, family history of colon cancer father in 72's MEDICATIONS:   These medications were titrated to patient response per physician's verbal order, Benadryl 12.5 mg IV, Fentanyl 75 mcg IV, Versed 9 mg IV  DESCRIPTION OF PROCEDURE:   After the risks benefits and alternatives of the procedure were thoroughly explained, informed consent was obtained.  Digital rectal exam was performed and revealed no abnormalities.   The LB 180AL [id number] endoscope was introduced through the anus and advanced to the cecum, which was identified by both the appendix and ileocecal valve, without limitations.  The quality of the prep was excellent, using MoviPrep.  The instrument was then slowly withdrawn as the colon was fully examined. <<PROCEDUREIMAGES>>  FINDINGS:  A normal appearing cecum, ileocecal valve, and appendiceal orifice were identified. The ascending, hepatic flexure, transverse, splenic flexure, descending, sigmoid colon, and rectum appeared unremarkable.   Retroflexed views in the rectum revealed internal and external hemorrhoids.    The time to cecum = 4:28 minutes. The scope was then withdrawn in 8:19 minutes from the cecum and the procedure completed. COMPLICATIONS:  None ENDOSCOPIC IMPRESSION: 1) Normal colon 2) Internal and external hemorrhoids RECOMMENDATIONS: Consider deep sedation (MAC) next time. REPEAT EXAM:  In 5 year(s) for routine screening colonoscopy. Family history of colon cancer (father in 21's)  Iva Boop, MD, [facility]  CC:  The Patient and Rosana Berger, MD  n. Rosalie Doctor:   Iva Boop at 10/22/2011 11:32  AM  Concepcion Living, [phone]

## 2011-10-23 ENCOUNTER — Telehealth: Payer: Self-pay | Admitting: *Deleted

## 2011-10-23 NOTE — Telephone Encounter (Signed)
  Follow up Call-  Call back number 10/22/2011  Post procedure Call Back phone  # [phone]  Permission to leave phone message Yes     Patient questions:  Do you have a fever, pain , or abdominal swelling? no Pain Score  0 *  Have you tolerated food without any problems? yes  Have you been able to return to your normal activities? yes  Do you have any questions about your discharge instructions: Diet   no Medications  no Follow up visit  no  Do you have questions or concerns about your Care? no  Actions: * If pain score is 4 or above: No action needed, pain <4.

## 2011-10-25 ENCOUNTER — Encounter (HOSPITAL_COMMUNITY): Payer: Self-pay

## 2011-10-25 ENCOUNTER — Encounter (HOSPITAL_COMMUNITY)
Admission: RE | Admit: 2011-10-25 | Discharge: 2011-10-25 | Disposition: A | Discharge status: Home / Self Care | Payer: Medicaid Other | Source: Ambulatory Visit | Attending: Surgery | Admitting: Surgery

## 2011-10-25 ENCOUNTER — Telehealth (INDEPENDENT_AMBULATORY_CARE_PROVIDER_SITE_OTHER): Payer: Self-pay | Admitting: General Surgery

## 2011-10-25 HISTORY — DX: Nausea with vomiting, unspecified: Z98.890

## 2011-10-25 HISTORY — DX: Nausea with vomiting, unspecified: R11.2

## 2011-10-25 LAB — CBC
HCT: 40.7 % (ref 36.0–46.0)
Hemoglobin: 14.1 g/dL (ref 12.0–15.0)
MCH: 32.8 pg (ref 26.0–34.0)
MCHC: 34.6 g/dL (ref 30.0–36.0)
MCV: 94.7 fL (ref 78.0–100.0)
Platelets: 228 10*3/uL (ref 150–400)
RBC: 4.3 MIL/uL (ref 3.87–5.11)
RDW: 13.2 % (ref 11.5–15.5)
WBC: 7.1 10*3/uL (ref 4.0–10.5)

## 2011-10-25 LAB — COMPREHENSIVE METABOLIC PANEL
ALT: 65 U/L — ABNORMAL HIGH (ref 0–35)
AST: 71 U/L — ABNORMAL HIGH (ref 0–37)
Albumin: 4.2 g/dL (ref 3.5–5.2)
Alkaline Phosphatase: 117 U/L (ref 39–117)
BUN: 14 mg/dL (ref 6–23)
CO2: 29 mEq/L (ref 19–32)
Calcium: 10 mg/dL (ref 8.4–10.5)
Chloride: 100 mEq/L (ref 96–112)
Creatinine, Ser: 0.56 mg/dL (ref 0.50–1.10)
GFR calc Af Amer: >90 mL/min (ref >90)
GFR calc non Af Amer: >90 mL/min (ref >90)
Glucose, Bld: 78 mg/dL (ref 70–99)
Potassium: 3.8 mEq/L (ref 3.5–5.1)
Sodium: 138 mEq/L (ref 135–145)
Total Bilirubin: 0.3 mg/dL (ref 0.3–1.2)
Total Protein: 8.6 g/dL — ABNORMAL HIGH (ref 6.0–8.3)

## 2011-10-25 LAB — SURGICAL PCR SCREEN
MRSA, PCR: NEGATIVE
Staphylococcus aureus: NEGATIVE

## 2011-10-25 NOTE — Progress Notes (Signed)
Quick Note:  This patient may proceed with surgery ______ 

## 2011-10-25 NOTE — Patient Instructions (Signed)
20 Amanda Bennett  10/25/2011   Your procedure is scheduled on:  10/30/11 0745am-0906am  Report to [facility] at 0545 AM.  Call this number if you have problems the morning of surgery: [phone]   Remember:   Do not eat food:After Midnight.  May have clear liquids:until Midnight .  Marland Kitchen  Take these medicines the morning of surgery with A SIP OF WATER:    Do not wear jewelry, make-up or nail polish.  Do not wear lotions, powders, or perfumes.   Do not shave 48 hours prior to surgery.  Do not bring valuables to the hospital.  Contacts, dentures or bridgework may not be worn into surgery.       Patients discharged the day of surgery will not be allowed to drive home.  Name and phone number of your driver:   Special Instructions: CHG Shower Use Special Wash: 1/2 bottle night before surgery and 1/2 bottle morning of surgery. Shower chin to toes with CHG. Wash face and private parts with regular soap.    Please read over the following fact sheets that you were given: MRSA Information, coughing and deep breathing exercises, leg exercises

## 2011-10-25 NOTE — Telephone Encounter (Signed)
Had Dr Magnus Ivan review the abn labs and ok for surgery and called back to [place] spoke to [place]

## 2011-10-25 NOTE — Pre-Procedure Instructions (Signed)
10/25/11 Labs okay for surgery per Dr Abigail Miyamoto. Dr Corliss Skains not available today.

## 2011-10-30 ENCOUNTER — Ambulatory Visit (HOSPITAL_COMMUNITY): Payer: Medicaid Other | Admitting: Anesthesiology

## 2011-10-30 ENCOUNTER — Encounter (HOSPITAL_COMMUNITY): Payer: Self-pay | Admitting: Anesthesiology

## 2011-10-30 ENCOUNTER — Encounter (HOSPITAL_COMMUNITY)
Admission: RE | Disposition: A | Discharge status: Home / Self Care | Payer: Self-pay | Source: Ambulatory Visit | Attending: Surgery

## 2011-10-30 ENCOUNTER — Other Ambulatory Visit (INDEPENDENT_AMBULATORY_CARE_PROVIDER_SITE_OTHER): Payer: Self-pay | Admitting: Surgery

## 2011-10-30 ENCOUNTER — Ambulatory Visit (HOSPITAL_COMMUNITY): Payer: Medicaid Other

## 2011-10-30 ENCOUNTER — Ambulatory Visit (HOSPITAL_COMMUNITY)
Admission: RE | Admit: 2011-10-30 | Discharge: 2011-10-30 | Disposition: A | Discharge status: Home / Self Care | Payer: Medicaid Other | Source: Ambulatory Visit | Attending: Surgery | Admitting: Surgery

## 2011-10-30 ENCOUNTER — Encounter (HOSPITAL_COMMUNITY): Payer: Self-pay

## 2011-10-30 DIAGNOSIS — Z79899 Other long term (current) drug therapy: Secondary | ICD-10-CM | POA: Insufficient documentation

## 2011-10-30 DIAGNOSIS — R142 Eructation: Secondary | ICD-10-CM | POA: Insufficient documentation

## 2011-10-30 DIAGNOSIS — K219 Gastro-esophageal reflux disease without esophagitis: Secondary | ICD-10-CM | POA: Insufficient documentation

## 2011-10-30 DIAGNOSIS — R059 Cough, unspecified: Secondary | ICD-10-CM | POA: Insufficient documentation

## 2011-10-30 DIAGNOSIS — R1011 Right upper quadrant pain: Secondary | ICD-10-CM | POA: Insufficient documentation

## 2011-10-30 DIAGNOSIS — Z01812 Encounter for preprocedural laboratory examination: Secondary | ICD-10-CM | POA: Insufficient documentation

## 2011-10-30 DIAGNOSIS — K8010 Calculus of gallbladder with chronic cholecystitis without obstruction: Secondary | ICD-10-CM

## 2011-10-30 DIAGNOSIS — R05 Cough: Secondary | ICD-10-CM | POA: Insufficient documentation

## 2011-10-30 DIAGNOSIS — R141 Gas pain: Secondary | ICD-10-CM | POA: Insufficient documentation

## 2011-10-30 DIAGNOSIS — R197 Diarrhea, unspecified: Secondary | ICD-10-CM | POA: Insufficient documentation

## 2011-10-30 DIAGNOSIS — R112 Nausea with vomiting, unspecified: Secondary | ICD-10-CM | POA: Insufficient documentation

## 2011-10-30 HISTORY — PX: CHOLECYSTECTOMY: SHX55

## 2011-10-30 SURGERY — LAPAROSCOPIC CHOLECYSTECTOMY WITH INTRAOPERATIVE CHOLANGIOGRAM
Anesthesia: General | Wound class: Clean Contaminated

## 2011-10-30 MED ORDER — GLYCOPYRROLATE 0.2 MG/ML IJ SOLN
INTRAMUSCULAR | Status: DC | PRN
  Administered 2011-10-30: .8 mg via INTRAVENOUS

## 2011-10-30 MED ORDER — KETOROLAC TROMETHAMINE 30 MG/ML IJ SOLN
15.0000 mg | Freq: Once | INTRAMUSCULAR | Status: AC | PRN
  Administered 2011-10-30: 30 mg via INTRAVENOUS

## 2011-10-30 MED ORDER — KETOROLAC TROMETHAMINE 30 MG/ML IJ SOLN
INTRAMUSCULAR | Status: AC
  Administered 2011-10-30: 30 mg via INTRAVENOUS
  Filled 2011-10-30: qty 1

## 2011-10-30 MED ORDER — MIDAZOLAM HCL 5 MG/5ML IJ SOLN
INTRAMUSCULAR | Status: DC | PRN
  Administered 2011-10-30: 2 mg via INTRAVENOUS

## 2011-10-30 MED ORDER — ROCURONIUM BROMIDE 100 MG/10ML IV SOLN
INTRAVENOUS | Status: DC | PRN
  Administered 2011-10-30: 40 mg via INTRAVENOUS

## 2011-10-30 MED ORDER — CEFAZOLIN SODIUM 1-5 GM-% IV SOLN
INTRAVENOUS | Status: AC
  Filled 2011-10-30: qty 50

## 2011-10-30 MED ORDER — ACETAMINOPHEN 10 MG/ML IV SOLN
INTRAVENOUS | Status: AC
  Filled 2011-10-30: qty 100

## 2011-10-30 MED ORDER — PROMETHAZINE HCL 25 MG/ML IJ SOLN
INTRAMUSCULAR | Status: AC
  Administered 2011-10-30: 6.25 mg via INTRAVENOUS
  Filled 2011-10-30: qty 1

## 2011-10-30 MED ORDER — OXYCODONE-ACETAMINOPHEN 5-325 MG PO TABS
ORAL_TABLET | ORAL | Status: AC
  Administered 2011-10-30: 1 via ORAL
  Filled 2011-10-30: qty 1

## 2011-10-30 MED ORDER — ACETAMINOPHEN 10 MG/ML IV SOLN
INTRAVENOUS | Status: DC | PRN
  Administered 2011-10-30: 1000 mg via INTRAVENOUS

## 2011-10-30 MED ORDER — FENTANYL CITRATE 0.05 MG/ML IJ SOLN
INTRAMUSCULAR | Status: DC | PRN
  Administered 2011-10-30 (×3): 50 ug via INTRAVENOUS
  Administered 2011-10-30: 100 ug via INTRAVENOUS

## 2011-10-30 MED ORDER — LACTATED RINGERS IV SOLN
INTRAVENOUS | Status: DC
  Administered 2011-10-30: 09:00:00 via INTRAVENOUS

## 2011-10-30 MED ORDER — FENTANYL CITRATE 0.05 MG/ML IJ SOLN
INTRAMUSCULAR | Status: AC
  Filled 2011-10-30: qty 2

## 2011-10-30 MED ORDER — 0.9 % SODIUM CHLORIDE (POUR BTL) OPTIME
TOPICAL | Status: DC | PRN
  Administered 2011-10-30: 1000 mL

## 2011-10-30 MED ORDER — PROMETHAZINE HCL 25 MG/ML IJ SOLN
INTRAMUSCULAR | Status: AC
  Filled 2011-10-30: qty 1

## 2011-10-30 MED ORDER — CEFAZOLIN SODIUM 1-5 GM-% IV SOLN: 1.0000 g | INTRAVENOUS | Status: DC

## 2011-10-30 MED ORDER — FENTANYL CITRATE 0.05 MG/ML IJ SOLN: 25.0000 ug | INTRAMUSCULAR | Status: DC | PRN

## 2011-10-30 MED ORDER — LIDOCAINE HCL (CARDIAC) 20 MG/ML IV SOLN
INTRAVENOUS | Status: DC | PRN
  Administered 2011-10-30: 30 mg via INTRAVENOUS

## 2011-10-30 MED ORDER — BUPIVACAINE-EPINEPHRINE PF 0.25-1:200000 % IJ SOLN
INTRAMUSCULAR | Status: AC
  Filled 2011-10-30: qty 30

## 2011-10-30 MED ORDER — IOHEXOL 300 MG/ML  SOLN
INTRAMUSCULAR | Status: AC
  Filled 2011-10-30: qty 1

## 2011-10-30 MED ORDER — LACTATED RINGERS IR SOLN
Status: DC | PRN
  Administered 2011-10-30: 1000 mL

## 2011-10-30 MED ORDER — FENTANYL CITRATE 0.05 MG/ML IJ SOLN
25.0000 ug | INTRAMUSCULAR | Status: DC | PRN
  Administered 2011-10-30 (×3): 50 ug via INTRAVENOUS

## 2011-10-30 MED ORDER — ONDANSETRON HCL 4 MG/2ML IJ SOLN
INTRAMUSCULAR | Status: DC | PRN
  Administered 2011-10-30 (×2): 2 mg via INTRAVENOUS

## 2011-10-30 MED ORDER — PROMETHAZINE HCL 25 MG/ML IJ SOLN
6.2500 mg | Freq: Once | INTRAMUSCULAR | Status: AC
  Administered 2011-10-30: 6.25 mg via INTRAVENOUS

## 2011-10-30 MED ORDER — BUPIVACAINE-EPINEPHRINE 0.25% -1:200000 IJ SOLN
INTRAMUSCULAR | Status: DC | PRN
  Administered 2011-10-30: 10 mL

## 2011-10-30 MED ORDER — OXYCODONE-ACETAMINOPHEN 5-325 MG PO TABS
1.0000 | ORAL_TABLET | ORAL | Status: DC | PRN
  Administered 2011-10-30: 1 via ORAL

## 2011-10-30 MED ORDER — SODIUM CHLORIDE 0.9 % IV SOLN
INTRAVENOUS | Status: DC | PRN
  Administered 2011-10-30: 08:00:00

## 2011-10-30 MED ORDER — NEOSTIGMINE METHYLSULFATE 1 MG/ML IJ SOLN
INTRAMUSCULAR | Status: DC | PRN
  Administered 2011-10-30: 1 mg via INTRAVENOUS

## 2011-10-30 MED ORDER — ONDANSETRON HCL 4 MG/2ML IJ SOLN: 4.0000 mg | INTRAMUSCULAR | Status: DC | PRN

## 2011-10-30 MED ORDER — LACTATED RINGERS IV SOLN
INTRAVENOUS | Status: DC | PRN
  Administered 2011-10-30: 07:00:00 via INTRAVENOUS

## 2011-10-30 MED ORDER — DROPERIDOL 2.5 MG/ML IJ SOLN
INTRAMUSCULAR | Status: DC | PRN
  Administered 2011-10-30: 0.625 mg via INTRAVENOUS

## 2011-10-30 MED ORDER — EPHEDRINE SULFATE 50 MG/ML IJ SOLN
INTRAMUSCULAR | Status: DC | PRN
  Administered 2011-10-30: 10 mg via INTRAVENOUS

## 2011-10-30 MED ORDER — METOCLOPRAMIDE HCL 5 MG/ML IJ SOLN: 10.0000 mg | Freq: Once | INTRAMUSCULAR | Status: DC

## 2011-10-30 MED ORDER — PROMETHAZINE HCL 25 MG/ML IJ SOLN
6.2500 mg | INTRAMUSCULAR | Status: DC | PRN
  Administered 2011-10-30: 6.25 mg via INTRAVENOUS

## 2011-10-30 MED ORDER — DEXAMETHASONE SODIUM PHOSPHATE 10 MG/ML IJ SOLN
INTRAMUSCULAR | Status: DC | PRN
  Administered 2011-10-30: 5 mg via INTRAVENOUS

## 2011-10-30 MED ORDER — PROPOFOL 10 MG/ML IV BOLUS
INTRAVENOUS | Status: DC | PRN
  Administered 2011-10-30: 150 mg via INTRAVENOUS

## 2011-10-30 MED ORDER — OXYCODONE-ACETAMINOPHEN 5-325 MG PO TABS: 1.0000 | ORAL_TABLET | ORAL | Status: DC | PRN

## 2011-10-30 SURGICAL SUPPLY — 41 items
APPLIER CLIP ROT 10 11.4 M/L (STAPLE) ×2
BENZOIN TINCTURE PRP APPL 2/3 (GAUZE/BANDAGES/DRESSINGS) ×2 IMPLANT
CANISTER SUCTION 2500CC (MISCELLANEOUS) ×2 IMPLANT
CHLORAPREP W/TINT 26ML (MISCELLANEOUS) ×2 IMPLANT
CLIP APPLIE ROT 10 11.4 M/L (STAPLE) ×1 IMPLANT
CLOSURE STERI STRIP 1/2 X4 (GAUZE/BANDAGES/DRESSINGS) ×2 IMPLANT
CLOTH BEACON ORANGE TIMEOUT ST (SAFETY) ×2 IMPLANT
COVER MAYO STAND STRL (DRAPES) ×2 IMPLANT
DECANTER SPIKE VIAL GLASS SM (MISCELLANEOUS) ×2 IMPLANT
DRAPE C-ARM 42X72 X-RAY (DRAPES) ×2 IMPLANT
DRAPE LAPAROSCOPIC ABDOMINAL (DRAPES) ×2 IMPLANT
DRAPE UTILITY XL STRL (DRAPES) ×2 IMPLANT
DRSG TEGADERM 2-3/8X2-3/4 SM (GAUZE/BANDAGES/DRESSINGS) ×6 IMPLANT
DRSG TEGADERM 4X4.75 (GAUZE/BANDAGES/DRESSINGS) ×2 IMPLANT
ELECT REM PT RETURN 9FT ADLT (ELECTROSURGICAL) ×2
ELECTRODE REM PT RTRN 9FT ADLT (ELECTROSURGICAL) ×1 IMPLANT
FILTER SMOKE EVAC LAPAROSHD (FILTER) ×2 IMPLANT
GLOVE BIO SURGEON STRL SZ7 (GLOVE) ×2 IMPLANT
GLOVE BIOGEL PI IND STRL 7.0 (GLOVE) ×1 IMPLANT
GLOVE BIOGEL PI IND STRL 7.5 (GLOVE) ×1 IMPLANT
GLOVE BIOGEL PI INDICATOR 7.0 (GLOVE) ×1
GLOVE BIOGEL PI INDICATOR 7.5 (GLOVE) ×1
GOWN STRL NON-REIN LRG LVL3 (GOWN DISPOSABLE) ×4 IMPLANT
GOWN STRL REIN XL XLG (GOWN DISPOSABLE) ×2 IMPLANT
KIT BASIN OR (CUSTOM PROCEDURE TRAY) ×2 IMPLANT
NS IRRIG 1000ML POUR BTL (IV SOLUTION) ×2 IMPLANT
POUCH SPECIMEN RETRIEVAL 10MM (ENDOMECHANICALS) ×2 IMPLANT
RINGERS IRRIG 1000ML POUR BTL (IV SOLUTION) ×2 IMPLANT
SET CHOLANGIOGRAPH MIX (MISCELLANEOUS) ×2 IMPLANT
SET IRRIG TUBING LAPAROSCOPIC (IRRIGATION / IRRIGATOR) ×2 IMPLANT
SOLUTION ANTI FOG 6CC (MISCELLANEOUS) ×2 IMPLANT
SPONGE GAUZE 4X4 12PLY (GAUZE/BANDAGES/DRESSINGS) ×2 IMPLANT
STRIP CLOSURE SKIN 1/2X4 (GAUZE/BANDAGES/DRESSINGS) ×2 IMPLANT
SUT MNCRL AB 4-0 PS2 18 (SUTURE) ×2 IMPLANT
TAPE CLOTH SURG 4X10 WHT LF (GAUZE/BANDAGES/DRESSINGS) ×2 IMPLANT
TOWEL OR 17X26 10 PK STRL BLUE (TOWEL DISPOSABLE) ×2 IMPLANT
TRAY LAP CHOLE (CUSTOM PROCEDURE TRAY) ×2 IMPLANT
TROCAR BLADELESS OPT 5 75 (ENDOMECHANICALS) ×4 IMPLANT
TROCAR XCEL BLUNT TIP 100MML (ENDOMECHANICALS) ×2 IMPLANT
TROCAR XCEL NON-BLD 11X100MML (ENDOMECHANICALS) ×2 IMPLANT
TUBING INSUFFLATION 10FT LAP (TUBING) ×2 IMPLANT

## 2011-10-30 NOTE — Progress Notes (Addendum)
Pt ambulated in hall. Tolerated well. States nausea almost gone, but she is comfortable enough to go home

## 2011-10-30 NOTE — Anesthesia Postprocedure Evaluation (Signed)
  Anesthesia Post-op Note  Patient: Amanda Bennett  Procedure(s) Performed: Procedure(s) (LRB): LAPAROSCOPIC CHOLECYSTECTOMY WITH INTRAOPERATIVE CHOLANGIOGRAM (N/A)  Patient Location: PACU  Anesthesia Type: General  Level of Consciousness: awake and alert   Airway and Oxygen Therapy: Patient Spontanous Breathing  Post-op Pain: mild  Post-op Assessment: Post-op Vital signs reviewed, Patient's Cardiovascular Status Stable, Respiratory Function Stable, Patent Airway and No signs of Nausea or vomiting  Post-op Vital Signs: stable  Complications: No apparent anesthesia complications

## 2011-10-30 NOTE — Preoperative (Signed)
Beta Blockers   Reason not to administer Beta Blockers:Not Applicable 

## 2011-10-30 NOTE — H&P (View-Only) (Signed)
Patient ID: Amanda Bennett, female   DOB: [birth year], 56 y.o.   MRN: [id number]  Chief Complaint  Patient presents with  . Pre-op Exam    eval gb    HPI Amanda Bennett is a 56 y.o. female.  Referred by Dr. Rosana Berger for gallstones HPI 56 yo female presents with a one month history of RUQ abdominal pain, nausea, vomiting, diarrhea, bloating.  Her symptoms are worse after eating, especially greasy foods.  She had an ultrasound this morning which showed gallstones, but no sign of cholecystitis.  CBD was normal.  She presents now for surgical evaluation. Past Medical History  Diagnosis Date  . Foot fracture, right 07/2010    per x-ray 07/2010 - oblique comminuted fifth metatarsal fracture, followed by Dr. Patsy Lager  . Depression   . Insomnia   . GERD (gastroesophageal reflux disease)   . Carpal tunnel syndrome   . Cervical spinal stenosis 2008    per x-ray findings 2008  . Pulmonary nodule, right     RUL nodule, 7 mm noted on Chest XRAY - 06/2008, per CT follow up in 2009 - No suspicious pulmonary nodules or masse noted  . Cervicalgia   . Pain in limb   . Lumbago   . Chronic pain syndrome   . Cervical syndrome   . Primary localized osteoarthrosis, pelvic region and thigh   . Lesion of ulnar nerve   . Anxiety   . Osteoporosis   . Unexplained weight loss   . Visual disturbance   . Cough   . Abdominal pain   . Nausea, vomiting and diarrhea   . Arthritis pain   . Bruises easily   . Wound disruption, post-op, skin     Past Surgical History  Procedure Date  . Abdominal hysterectomy   . Finger amputated     right ring  . Drain in left ring finger     cat bite  . Broken clavical     Family History  Problem Relation Age of Onset  . Stroke Neg Hx   . Cancer Father     colon/prostate  . Cancer Paternal Grandmother     ovarian    Social History History  Substance Use Topics  . Smoking status: Current Everyday Smoker -- 0.5 packs/day    Types: Cigarettes    Last Attempt  to Quit: 01/08/2011  . Smokeless tobacco: Not on file  . Alcohol Use: No    Allergies  Allergen Reactions  . Morphine And Related Shortness Of Breath and Itching  . Prozac (Fluoxetine Hcl)     Current Outpatient Prescriptions  Medication Sig Dispense Refill  . albuterol (PROVENTIL HFA;VENTOLIN HFA) 108 (90 BASE) MCG/ACT inhaler Inhale 1 puff into the lungs 2 (two) times daily.      Marland Kitchen alendronate (FOSAMAX) 70 MG tablet Take 1 tablet (70 mg total) by mouth every 7 (seven) days. Take with a full glass of water on an empty stomach.  4 tablet  11  . Calcium Carb-Cholecalciferol [phone] MG-UNIT TABS Take 1 tablet by mouth daily.  30 tablet  11  . clonazePAM (KLONOPIN) 0.5 MG tablet Take 1 tablet (0.5 mg total) by mouth daily as needed.  30 tablet  0  . DULoxetine (CYMBALTA) 20 MG capsule Take 1 capsule (20 mg total) by mouth daily.  30 capsule  11  . esomeprazole (NEXIUM) 20 MG capsule Take 1 capsule (20 mg total) by mouth daily.  30 capsule  11  . estrogens, conjugated, (  PREMARIN) 0.625 MG tablet Take 1 tablet (0.625 mg total) by mouth daily. Take 0.625 mg by mouth daily.  30 tablet  3  . gabapentin (NEURONTIN) 400 MG capsule Take 400 mg by mouth every 6 (six) hours.      Marland Kitchen oxyCODONE (OXY IR/ROXICODONE) 5 MG immediate release tablet Take 5 mg by mouth 2 (two) times daily.      Marland Kitchen zolpidem (AMBIEN) 10 MG tablet Take 10 mg by mouth at bedtime as needed.         Review of Systems Review of Systems  Constitutional: Positive for unexpected weight change. Negative for fever and chills.  HENT: Negative for hearing loss, congestion, sore throat, trouble swallowing and voice change.   Eyes: Negative for visual disturbance.  Respiratory: Positive for cough. Negative for wheezing.   Cardiovascular: Negative for chest pain, palpitations and leg swelling.  Gastrointestinal: Positive for nausea, vomiting, abdominal pain, diarrhea and abdominal distention. Negative for constipation, blood in stool and  anal bleeding.  Genitourinary: Negative for hematuria, vaginal bleeding and difficulty urinating.  Musculoskeletal: Positive for arthralgias.  Skin: Negative for rash and wound.  Neurological: Negative for seizures, syncope and headaches.  Hematological: Negative for adenopathy. Does not bruise/bleed easily.  Psychiatric/Behavioral: Negative for confusion.    Blood pressure 122/80, pulse 74, temperature 98.2 F (36.8 C), temperature source Temporal, height 5\' 4"  (1.626 m), weight 115 lb 3.2 oz (52.254 kg), last menstrual period 10/23/2002, SpO2 98.00%.  Physical Exam Physical Exam WDWN in NAD HEENT:  EOMI, sclera anicteric Neck:  No masses, no thyromegaly Lungs:  CTA bilaterally; normal respiratory effort CV:  Regular rate and rhythm; no murmurs Abd:  +bowel sounds, soft,tender in RUQ radiating around to back Ext:  Well-perfused; no edema Skin:  Warm, dry; no sign of jaundice  Data Reviewed Ultrasound  Assessment    Chronic calculus cholecystitis    Plan    Laparoscopic cholecystectomy with intraoperative cholangiogram.  The surgical procedure has been discussed with the patient.  Potential risks, benefits, alternative treatments, and expected outcomes have been explained.  All of the patient's questions at this time have been answered.  The likelihood of reaching the patient's treatment goal is good.  The patient understand the proposed surgical procedure and wishes to proceed.        Julieann Drummonds K. 10/03/2011, 11:00 AM

## 2011-10-30 NOTE — Progress Notes (Signed)
C/O continued nausea call to Dr Okey Dupre for PRN order In Phase II

## 2011-10-30 NOTE — Progress Notes (Signed)
Blood pressure  174/87

## 2011-10-30 NOTE — Anesthesia Preprocedure Evaluation (Addendum)
Anesthesia Evaluation  Patient identified by MRN, date of birth, ID band Patient awake    Reviewed: Allergy & Precautions, H&P , NPO status , Patient's Chart, lab work & pertinent test results  History of Anesthesia Complications (+) PONV  Airway Mallampati: II TM Distance: <3 FB Neck ROM: Full    Dental No notable dental hx.    Pulmonary Current Smoker,  clear to auscultation  Pulmonary exam normal       Cardiovascular neg cardio ROS Regular Normal    Neuro/Psych Anxiety Negative Neurological ROS     GI/Hepatic GERD-  ,(+) Hepatitis -, C  Endo/Other  Negative Endocrine ROS  Renal/GU negative Renal ROS  Genitourinary negative   Musculoskeletal negative musculoskeletal ROS (+)   Abdominal   Peds negative pediatric ROS (+)  Hematology negative hematology ROS (+)   Anesthesia Other Findings   Reproductive/Obstetrics negative OB ROS                          Anesthesia Physical Anesthesia Plan  ASA: II  Anesthesia Plan: General   Post-op Pain Management:    Induction: Intravenous  Airway Management Planned: Oral ETT  Additional Equipment:   Intra-op Plan:   Post-operative Plan: Extubation in OR  Informed Consent: I have reviewed the patients History and Physical, chart, labs and discussed the procedure including the risks, benefits and alternatives for the proposed anesthesia with the patient or authorized representative who has indicated his/her understanding and acceptance.   Dental advisory given  Plan Discussed with: CRNA  Anesthesia Plan Comments:         Anesthesia Quick Evaluation

## 2011-10-30 NOTE — Interval H&P Note (Signed)
History and Physical Interval Note:  10/30/2011 7:11 AM  Amanda Bennett  has presented today for surgery, with the diagnosis of chronic calculus cholecystitis   The various methods of treatment have been discussed with the patient and family. After consideration of risks, benefits and other options for treatment, the patient has consented to  Procedure(s) (LRB): LAPAROSCOPIC CHOLECYSTECTOMY WITH INTRAOPERATIVE CHOLANGIOGRAM (N/A) as a surgical intervention .  The patients' history has been reviewed, patient examined, no change in status, stable for surgery.  I have reviewed the patients' chart and labs.  Questions were answered to the patient's satisfaction.     [name] K.

## 2011-10-30 NOTE — Transfer of Care (Signed)
Immediate Anesthesia Transfer of Care Note  Patient: Amanda Bennett  Procedure(s) Performed: Procedure(s) (LRB): LAPAROSCOPIC CHOLECYSTECTOMY WITH INTRAOPERATIVE CHOLANGIOGRAM (N/A)  Patient Location: PACU  Anesthesia Type: General  Level of Consciousness: awake  Airway & Oxygen Therapy: Patient Spontanous Breathing  Post-op Assessment: Report given to PACU RN and Post -op Vital signs reviewed and stable  Post vital signs: stable  Complications: No apparent anesthesia complications

## 2011-10-30 NOTE — Op Note (Signed)
Laparoscopic Cholecystectomy with IOC Procedure Note  Indications: This patient presents with symptomatic gallbladder disease and will undergo laparoscopic cholecystectomy.  Pre-operative Diagnosis: Calculus of gallbladder with other cholecystitis, without mention of obstruction  Post-operative Diagnosis: Same  Surgeon: Noura Purpura K.   Assistants: [place]   Anesthesia: General endotracheal anesthesia  ASA Class: 2  Procedure Details  The patient was seen again in the Holding Room. The risks, benefits, complications, treatment options, and expected outcomes were discussed with the patient. The possibilities of reaction to medication, pulmonary aspiration, perforation of viscus, bleeding, recurrent infection, finding a normal gallbladder, the need for additional procedures, failure to diagnose a condition, the possible need to convert to an open procedure, and creating a complication requiring transfusion or operation were discussed with the patient. The likelihood of improving the patient's symptoms with return to their baseline status is good.  The patient and/or family concurred with the proposed plan, giving informed consent. The site of surgery properly noted. The patient was taken to Operating Room, identified as SAMIYYAH MOFFA and the procedure verified as Laparoscopic Cholecystectomy with Intraoperative Cholangiogram. A Time Out was held and the above information confirmed.  Prior to the induction of general anesthesia, antibiotic prophylaxis was administered. General endotracheal anesthesia was then administered and tolerated well. After the induction, the abdomen was prepped with Chloraprep and draped in the sterile fashion. The patient was positioned in the supine position.  Local anesthetic agent was injected into the skin near the umbilicus and an incision made. We dissected down to the abdominal fascia with blunt dissection.  The fascia was incised vertically and we entered the  peritoneal cavity bluntly.  A pursestring suture of 0-Vicryl was placed around the fascial opening.  The Hasson cannula was inserted and secured with the stay suture.  Pneumoperitoneum was then created with CO2 and tolerated well without any adverse changes in the patient's vital signs. An 11-mm port was placed in the subxiphoid position.  Two 5-mm ports were placed in the right upper quadrant. All skin incisions were infiltrated with a local anesthetic agent before making the incision and placing the trocars.   We positioned the patient in reverse Trendelenburg, tilted slightly to the patient's left.  The patient has a fairly large liver for her body size.  The gallbladder was identified, the fundus grasped and retracted cephalad. Adhesions were lysed bluntly and with the electrocautery where indicated, taking care not to injure any adjacent organs or viscus. The infundibulum was grasped and retracted laterally, exposing the peritoneum overlying the triangle of Calot. This was then divided and exposed in a blunt fashion. A critical view of the cystic duct and cystic artery was obtained.  The cystic duct was clearly identified and bluntly dissected circumferentially. The cystic duct was ligated with a clip distally.   An incision was made in the cystic duct and the [facility] cholangiogram catheter introduced. The catheter was secured using a clip. A cholangiogram was then obtained which showed good visualization of the distal and proximal biliary tree with no sign of filling defects or obstruction.  Contrast flowed easily into the duodenum. The catheter was then removed.   The cystic duct was then ligated with clips and divided. The cystic artery was identified, dissected free, ligated with clips and divided as well.   The gallbladder was dissected from the liver bed in retrograde fashion with the electrocautery. The gallbladder was removed and placed in an Endocatch sac. The liver bed was irrigated and inspected.  Hemostasis was achieved  with the electrocautery. Copious irrigation was utilized and was repeatedly aspirated until clear.  The gallbladder and Endocatch sac were then removed through the umbilical port site.  The pursestring suture was used to close the umbilical fascia.    We again inspected the right upper quadrant for hemostasis.  Pneumoperitoneum was released as we removed the trocars.  4-0 Monocryl was used to close the skin.   Benzoin, steri-strips, and clean dressings were applied. The patient was then extubated and brought to the recovery room in stable condition. Instrument, sponge, and needle counts were correct at closure and at the conclusion of the case.   Findings: Cholecystitis with Cholelithiasis  Estimated Blood Loss: Minimal         Drains: None         Specimens: Gallbladder           Complications: None; patient tolerated the procedure well.         Disposition: PACU - hemodynamically stable.         Condition: stable

## 2011-10-30 NOTE — Progress Notes (Signed)
Dr. Okey Dupre notified of patient's elevated blood pressures in PACU

## 2011-11-07 ENCOUNTER — Encounter: Payer: Medicaid Other | Attending: Neurosurgery | Admitting: Physical Medicine and Rehabilitation

## 2011-11-07 ENCOUNTER — Other Ambulatory Visit: Payer: Self-pay | Admitting: *Deleted

## 2011-11-07 ENCOUNTER — Encounter (INDEPENDENT_AMBULATORY_CARE_PROVIDER_SITE_OTHER): Payer: Self-pay | Admitting: Surgery

## 2011-11-07 ENCOUNTER — Encounter: Payer: Self-pay | Admitting: Physical Medicine and Rehabilitation

## 2011-11-07 DIAGNOSIS — M545 Low back pain, unspecified: Secondary | ICD-10-CM | POA: Insufficient documentation

## 2011-11-07 DIAGNOSIS — M542 Cervicalgia: Secondary | ICD-10-CM | POA: Insufficient documentation

## 2011-11-07 DIAGNOSIS — M79609 Pain in unspecified limb: Secondary | ICD-10-CM | POA: Insufficient documentation

## 2011-11-07 DIAGNOSIS — G8929 Other chronic pain: Secondary | ICD-10-CM | POA: Insufficient documentation

## 2011-11-07 DIAGNOSIS — M19049 Primary osteoarthritis, unspecified hand: Secondary | ICD-10-CM

## 2011-11-07 MED ORDER — GABAPENTIN 600 MG PO TABS: 600.0000 mg | ORAL_TABLET | Freq: Four times a day (QID) | ORAL | Status: DC

## 2011-11-07 NOTE — Patient Instructions (Addendum)
1. Followup in one month  2. We have increased her dose of gabapentin. You are now off of your Percocet. Please let me know if you have problems with the increased dose. Call our clinic and we can give you instructions how to decrease it.

## 2011-11-07 NOTE — Progress Notes (Signed)
Subjective:    Patient ID: Amanda Bennett, female    DOB: Dec 29, 1955, 56 y.o.   MRN: [id number]  HPI The patient is a 56 year old woman who [facility] complaints that are related to her neck in her low back. She also has a history of intermittent hand numbness and elected diagnostic studies last spring showed mild carpal tunnel syndrome.  She recently underwent gallbladder surgery on 10/30/2011. She is still recovering from the surgery and has a postop visit next week with Dr. Margaree Mackintosh.  In the interim she has also been diagnosed with osteoporosis and her PCP may be starting her on some medication for this.  Last fall she saw Dr. Venetia Maxon and cervical MRI and lumbar MRI were ordered. A surgical option was not presented.  She is back today for a refill of her gabapentin. Previously she had been on clonazepam and Percocet  Her primary care doctor took her off clonazepam before her surgery and she has weaned herself off of her Percocet approximately one week ago. (age 56)    Pain Inventory Average Pain 7 Pain Right Now 6 My pain is sharp, burning, dull, stabbing, tingling and aching  In the last 24 hours, has pain interfered with the following? General activity 10 Relation with others 9 Enjoyment of life 10 What TIME of day is your pain at its worst? Morning, Evening and Night Sleep (in general) Poor  Pain is worse with: walking, bending, sitting and standing Pain improves with: rest, heat/ice, pacing activities, medication and injections Relief from Meds: 5  Mobility use a cane how many minutes can you walk? 5 minutes ability to climb steps?  no do you drive?  yes  Function disabled: date disabled 2010  Neuro/Psych weakness numbness tremor trouble walking depression anxiety  Prior Studies Any changes since last visit?  no  Physicians involved in your care Any changes since last visit?  no      Review of  Systems  Constitutional: Positive for unexpected weight change.  HENT: Negative.   Eyes: Negative.   Respiratory: Negative.   Cardiovascular: Negative.   Gastrointestinal: Positive for diarrhea and constipation.  Genitourinary: Negative.   Musculoskeletal: Negative.   Skin: Negative.   Neurological: Negative.   Hematological: Negative.   Psychiatric/Behavioral: Negative.  Negative for agitation.       Objective:   Physical Exam  The patient is a well-developed thin adult female who does not appear in any distress. She is oriented x3 her speech is clear her affect is bright and she's alert and cooperative and pleasant. She follows commands without difficulty answers questions appropriately  Cranial nerves and coordination are grossly intact. Her reflexes are 2+ in the upper extremities and 2+ in the lower extremities without abnormal tone clonus or tremors.  From 4 strength is generally good in both upper and lower extremities. She has decreased sensation over the median distribution of both hands.  He transitions slowly from sitting to standing. Her gait is a nonantalgic. She has limitations in cervical range of motion mildly she has some limitations and lumbar motion for flexion and extension today.  She has significant first carpal metacarpal joint deformity on the right. Some involvement as noted on the left but much milder.  She has a healing periumbilical incision.          Assessment & Plan:  1. 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 Bilateral hand pain  3. Right thumb  CMC joint OA  4. Mild bilateral carpal tunnel syndrome  5. Low back pain  Plan: The patient is is completely off of her Percocet for about a week at this time. She finds the gabapentin has been helpful. She had been on 400 mg 4 times a day, and I will increase it to 600 mg 4 times a day for the next month. She is currently recovering from gallbladder surgery and has a followup  appointment with her surgeon next week. She's in contact with primary care for management of her osteoporosis.  She is interested in getting started in a exercise routine in the upcoming months. I suggested she start walking on a regular basis starting at 10 minutes going at least 4 times a day. She is currently on lifting restrictions secondary to her abdominal surgery at this time.  I will see her back in a month.

## 2011-11-07 NOTE — Assessment & Plan Note (Signed)
Patient is considering injection in the upcoming months.

## 2011-11-08 MED ORDER — ZOLPIDEM TARTRATE 10 MG PO TABS: 10.0000 mg | ORAL_TABLET | Freq: Every evening | ORAL | Status: DC | PRN

## 2011-11-09 NOTE — Telephone Encounter (Signed)
Rx called in 

## 2011-11-12 ENCOUNTER — Encounter (HOSPITAL_COMMUNITY): Payer: Self-pay | Admitting: Surgery

## 2011-11-13 ENCOUNTER — Encounter (INDEPENDENT_AMBULATORY_CARE_PROVIDER_SITE_OTHER): Payer: Self-pay | Admitting: Surgery

## 2011-11-13 ENCOUNTER — Ambulatory Visit (INDEPENDENT_AMBULATORY_CARE_PROVIDER_SITE_OTHER): Payer: Medicaid Other | Admitting: Surgery

## 2011-11-13 VITALS — BP 112/77 | HR 74 | Temp 98.6°F | Resp 16 | Ht 64.0 in | Wt 119.7 lb

## 2011-11-13 DIAGNOSIS — K8010 Calculus of gallbladder with chronic cholecystitis without obstruction: Secondary | ICD-10-CM

## 2011-11-13 NOTE — Progress Notes (Signed)
This patient is status post laparoscopic cholecystectomy with intraoperative cholangiogram.  The pathology report showed chronic cholecystitis.  The patient reports that the post-operative pain has resolved.  They have resumed a regular diet without problems and are having regular bowel movements.  On physical examination, all of the incisions are healing well with no signs of infection or bleeding.  Steri-strips have all been removed.  Impression:  The patient is doing well after laparoscopic cholecystectomy for chronic cholecystitis.  Plan:  The patient may resume full activity and regular diet.  They may follow-up with [place] on a PRN basis.    Wilmon Arms. Corliss Skains, MD, [facility]  11/13/2011 2:14 PM

## 2011-12-11 ENCOUNTER — Encounter: Payer: Medicaid Other | Admitting: Internal Medicine

## 2011-12-25 ENCOUNTER — Encounter: Payer: Self-pay | Admitting: Internal Medicine

## 2011-12-25 ENCOUNTER — Ambulatory Visit (INDEPENDENT_AMBULATORY_CARE_PROVIDER_SITE_OTHER): Payer: Medicaid Other | Admitting: Internal Medicine

## 2011-12-25 VITALS — BP 102/64 | HR 70 | Temp 97.1°F | Ht 64.0 in | Wt 120.6 lb

## 2011-12-25 DIAGNOSIS — F329 Major depressive disorder, single episode, unspecified: Secondary | ICD-10-CM

## 2011-12-25 DIAGNOSIS — G8929 Other chronic pain: Secondary | ICD-10-CM

## 2011-12-25 DIAGNOSIS — G4700 Insomnia, unspecified: Secondary | ICD-10-CM

## 2011-12-25 DIAGNOSIS — M542 Cervicalgia: Secondary | ICD-10-CM

## 2011-12-25 MED ORDER — TRAZODONE 25 MG HALF TABLET: 25.0000 mg | ORAL_TABLET | Freq: Every evening | ORAL | Status: DC | PRN

## 2011-12-25 MED ORDER — GABAPENTIN 300 MG PO CAPS: 300.0000 mg | ORAL_CAPSULE | Freq: Four times a day (QID) | ORAL | Status: DC

## 2011-12-25 MED ORDER — DULOXETINE HCL 20 MG PO CPEP: 20.0000 mg | ORAL_CAPSULE | Freq: Two times a day (BID) | ORAL | Status: DC

## 2011-12-25 NOTE — Progress Notes (Signed)
HPI: Ms. Heckmann is a 56 yo woman with PMH of hep C, depression, insomnia, chronic pain syndrome mostly in her neck presents today for follow up.  She reports taking herself off of Klonopin because of her liver disease.  She is going through a lot of stress and more depressed and wants to talk about her medication regimen.  Her pain clinic increased her Neurontin to 2400mg  daily which knocks her out, very drowsy and not able to do anything.  She states that she was on 1200mg  in the past and she does well with that.  She has an appointment with pain clinic tomorrow to discuss about percocet because apparently she took herself off during pre-op for cholecystectomy and so pain physician does not think she needs percocet anymore. However, she states that she still has pain and will need it on a PRN basis in order to function.  She states that she may try Vicodin because it worked well for her in the past.  She tried Tramadol but it made her nauseous.  In addition, she is not able to sleep at night, only 1-2 hours per night.  Ambien does not work for her.   Per patient's report, she was found to have a nodule in her lung during pre-op and would like to follow up on that; however, I am not able to find any imaging in [facility].  Will continue to look .  ROS: as per HPI  PE: General: alert, well-developed, and cooperative to examination. .  Lungs: normal respiratory effort, no accessory muscle use, normal breath sounds, no crackles, and no wheezes. Heart: normal rate, regular rhythm, no murmur, no gallop, and no rub.  Abdomen: soft, non-tender, normal bowel sounds, no distention, no guarding, no rebound tenderness Msk: no joint swelling, no joint warmth, and no redness over joints.  Pulses: 2+ DP/PT pulses bilaterally Extremities: No cyanosis, clubbing, edema Neurologic: alert & oriented X3, cranial nerves II-XII intact, strength normal in all extremities, sensation intact to light touch, and gait normal.  Ps  normal and no rashes. ych: Oriented X3, memory intact for recent and remote, normally interactive, good eye contact, ++ anxious appearing, and ++depressed appearing.

## 2011-12-25 NOTE — Assessment & Plan Note (Addendum)
I suspect that the underlying reason of her insomnia could be due to her depression.  -Stop Ambien  -Will increase Cymbalta to 40mg  -Add low dose Trazodone 12.5mg  qd prn

## 2011-12-25 NOTE — Assessment & Plan Note (Signed)
Not well controlled on Cymbalta 20mg . -Will increase to 40mg  -Will also add low dose Trazodone to help with insomnia -Reassess in 2 weeks

## 2011-12-25 NOTE — Patient Instructions (Signed)
Start taking Trazodone 25mg  one half tablet at bed time as needed for sleep Increase Cymbalta to 20mg  twice daily Decrease Neurontin to 300mg  4 times daily Follow up with Pain Clinic tomorrow  Follow up with Dr. Anselm Jungling in 2 weeks

## 2011-12-25 NOTE — Assessment & Plan Note (Signed)
Not well-controlled. Patient is very drowsy with high dose of Neurontin 2400mg  daily -Will decrease to 300mg  QID -Patient will discuss with Pain Clinic about Percocet vs. Vicodin tomorrow

## 2011-12-26 ENCOUNTER — Encounter: Payer: Self-pay | Admitting: Physical Medicine and Rehabilitation

## 2011-12-26 ENCOUNTER — Encounter: Payer: Medicaid Other | Attending: Neurosurgery | Admitting: Physical Medicine and Rehabilitation

## 2011-12-26 VITALS — BP 131/72 | HR 68 | Resp 16 | Ht 64.0 in | Wt 119.8 lb

## 2011-12-26 DIAGNOSIS — M19049 Primary osteoarthritis, unspecified hand: Secondary | ICD-10-CM

## 2011-12-26 DIAGNOSIS — M545 Low back pain, unspecified: Secondary | ICD-10-CM

## 2011-12-26 DIAGNOSIS — G8929 Other chronic pain: Secondary | ICD-10-CM

## 2011-12-26 DIAGNOSIS — M47816 Spondylosis without myelopathy or radiculopathy, lumbar region: Secondary | ICD-10-CM | POA: Insufficient documentation

## 2011-12-26 DIAGNOSIS — G5600 Carpal tunnel syndrome, unspecified upper limb: Secondary | ICD-10-CM

## 2011-12-26 DIAGNOSIS — M79609 Pain in unspecified limb: Secondary | ICD-10-CM

## 2011-12-26 DIAGNOSIS — M542 Cervicalgia: Secondary | ICD-10-CM

## 2011-12-26 DIAGNOSIS — M79644 Pain in right finger(s): Secondary | ICD-10-CM

## 2011-12-26 DIAGNOSIS — M47817 Spondylosis without myelopathy or radiculopathy, lumbosacral region: Secondary | ICD-10-CM

## 2011-12-26 MED ORDER — OXYCODONE HCL 5 MG PO TABS: 5.0000 mg | ORAL_TABLET | Freq: Once | ORAL | Status: DC

## 2011-12-26 NOTE — Progress Notes (Signed)
Subjective:    Patient ID: Amanda Bennett, female    DOB: 06-Sep-1956, 56 y.o.   MRN: [id number]  HPI The patient is a 56 year old woman who [facility] complaints that are related to her neck in her low back.   She also has a history of intermittent hand numbness and elected diagnostic studies last spring showed mild carpal tunnel syndrome.  Using night splints for this with beneficial reports  She reports continued pain in her right thumb.  She recently underwent gallbladder surgery on 10/30/2011  Dr. Margaree Mackintosh  .In the interim she has also been diagnosed with osteoporosis and her PCP may be starting her on some medication for this.    Last fall she saw Dr. Venetia Maxon and cervical MRI and lumbar MRI were ordered. A surgical option was not presented.       Previously she had been on clonazepam and Percocet    Her primary care doctor took her off clonazepam before her surgery and she has weaned herself off of her Percocet.   Pain Inventory Average Pain 7 Pain Right Now 7 My pain is sharp, burning, dull, stabbing, tingling and aching  In the last 24 hours, has pain interfered with the following? General activity 9 Relation with others 10 Enjoyment of life 10 What TIME of day is your pain at its worst? morning evening and night Sleep (in general) Poor  Pain is worse with: walking, bending, sitting, inactivity and standing Pain improves with: rest, pacing activities, medication and injections Relief from Meds: 4  Mobility how many minutes can you walk? 5 ability to climb steps?  no do you drive?  no  Function disabled: date disabled 2010 I need assistance with the following:  household duties and shopping  Neuro/Psych weakness tremor tingling trouble walking spasms dizziness confusion depression anxiety  Prior Studies Any changes since last visit?  no  Physicians involved in your care Any changes  since last visit?  no Primary care [facility]      Review of Systems  Constitutional: Positive for diaphoresis, appetite change and unexpected weight change.       Poor appetite, night sweats, and weight loss  Respiratory: Positive for shortness of breath.   Gastrointestinal: Positive for nausea, vomiting, diarrhea and constipation.  Musculoskeletal: Positive for back pain and gait problem.  Neurological: Positive for dizziness and weakness.  Hematological: Bruises/bleeds easily.  Psychiatric/Behavioral: Positive for confusion and dysphoric mood. The patient is nervous/anxious.   All other systems reviewed and are negative.       Objective:   Physical Exam   Physical Exam  The patient is a well-developed thin adult female who does not appear in any distress. She is oriented x3 her speech is clear her affect is bright and she's alert and cooperative and pleasant. She follows commands without difficulty answers questions appropriately  Cranial nerves and coordination are grossly intact. Her reflexes are 2+ in the upper extremities and 2+ in the left lower extremity right lower extremity 2+ at the knee decreased at the ankle lower extremities without abnormal tone clonus or tremors.  From 4 strength is generally good in both upper and lower extremities.dentition is intact in the upper extremities she reports some decreased sensation in the dorsum of the right foot He transitions slowly from sitting to standing. Her gait is a nonantalgic. She has limitations in cervical range of motion mildly she has some limitations and lumbar motion for  flexion and extension today.  She has significant first carpal metacarpal joint deformity on the right tenderness is noted over the [facility] joint. Some involvement as noted on the left but much milder.  She has a healing periumbilical incision.    "06/07/11 lumbar MRI  Marrow signal homogeneous. Mild  disc desiccation and loss of disc height at L3-4.  Normal conus.  No disc protrusion or spinal stenosis. Mild lower lumbar facet  arthropathy. No extraforaminal protrusion or significant foraminal  narrowing. Unremarkable paraspinous soft tissues.  IMPRESSION:  Mild lumbar spondylosis. No compressive lesion."      Assessment & Plan:  1. 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 Bilateral hand pain  3. Right thumb CMC joint OA  4. Mild bilateral carpal tunnel syndrome he continues to use night splints for this problem, she reports this seems to help 5. Low back pain recommend pool program    Plan: The patient is is completely off of her Percocet for about a week at this time. She has been off Percocet now for 1 month.  Her abdominal wound is now healed. Her surgeon she tells me is allowing her to use the pool. She has been released from the surgeon at this time.   She finds the gabapentin has been helpful. She is currently taking 600 mg twice a day. I've asked her what she doesn't take an extra dose but she tells me it is deceased today being and she wants to get active again.  She's in contact with primary care for management of her osteoporosis.    She is interested in getting started in a exercise routine in the upcoming months.  She is having will put in her backyard this week.  I recommend to start water walking avoiding positions that put her neck her low back into extension. She verbalizes understanding  I will start her back on oxycodone 5 mg/325 one per day #30 for a short period of time bat is 1 month. She understands that this be a short term medication.  She understands the risks and benefits of narcotics. She will not be using machinery or driving while on this medication will  not use alcohol  She will keep them locked up in a secure location.   We'll schedule her for a right CMC joint injection at the next available appointment using ultrasound.  I will see her back in a month.

## 2011-12-26 NOTE — Patient Instructions (Signed)
I will see you back in one month.  Keep her medications locked up and secure  I will schedule you for a right thumb injection with ultrasound  We will hold off on scheduling medial branch blocks for your lumbar spine at this time. I would like to see how you do when she started pool therapy and did a little bit more active.

## 2011-12-27 ENCOUNTER — Other Ambulatory Visit: Payer: Self-pay | Admitting: Internal Medicine

## 2011-12-27 ENCOUNTER — Telehealth: Payer: Self-pay | Admitting: *Deleted

## 2011-12-27 MED ORDER — NICOTINE 14 MG/24HR TD PT24: MEDICATED_PATCH | TRANSDERMAL | Status: DC

## 2011-12-27 NOTE — Telephone Encounter (Signed)
Rx for Nicoderm Patch sent to patient's Pharmacy.

## 2011-12-27 NOTE — Telephone Encounter (Signed)
Pt requesting a Rx for nicoderm patches.  She talked with you about this at last visit.

## 2012-01-02 ENCOUNTER — Telehealth: Payer: Self-pay | Admitting: *Deleted

## 2012-01-02 NOTE — Telephone Encounter (Signed)
Needs refill on Premarin 0.625mg  # 100 one tablet by mouth every day - last refill 10/04/11 and written 10/03/11 [facility] pharmacy. Tarin Boston Catarino RN 01/02/12 12:15PM

## 2012-01-16 ENCOUNTER — Encounter: Payer: Medicaid Other | Attending: Neurosurgery | Admitting: Physical Medicine and Rehabilitation

## 2012-01-16 ENCOUNTER — Encounter: Payer: Self-pay | Admitting: Physical Medicine and Rehabilitation

## 2012-01-16 VITALS — BP 149/73 | HR 77 | Resp 14 | Ht 64.0 in | Wt 119.0 lb

## 2012-01-16 DIAGNOSIS — M79609 Pain in unspecified limb: Secondary | ICD-10-CM | POA: Insufficient documentation

## 2012-01-16 DIAGNOSIS — M542 Cervicalgia: Secondary | ICD-10-CM | POA: Insufficient documentation

## 2012-01-16 DIAGNOSIS — M545 Low back pain, unspecified: Secondary | ICD-10-CM | POA: Insufficient documentation

## 2012-01-16 DIAGNOSIS — M19049 Primary osteoarthritis, unspecified hand: Secondary | ICD-10-CM

## 2012-01-16 DIAGNOSIS — G8929 Other chronic pain: Secondary | ICD-10-CM | POA: Insufficient documentation

## 2012-01-16 MED ORDER — OXYCODONE HCL 5 MG PO TABS: 5.0000 mg | ORAL_TABLET | Freq: Once | ORAL | Status: DC

## 2012-01-16 NOTE — Progress Notes (Signed)
Right Carpal metacarpal joint injection with ultrasound guidance  Indication joint pain not relieved with conservative management.  Informed consent was obtained after describing the risks and benefits  as well as alternatives to this procedure.  Risks include bleeding bruising and infection nerve damage as well as skin changes  Patient wishes to proceed and has given written consent. Ultrasound was used to identify the joint and the skin was marked appropriately.  The area was swabbed with betadine  and alcohol. A 27-gauge need a using 0.5 cc of 1% lidocaine was used to infiltrate the area prior to injection. Another 27-gauge needle was used to injection 0.5 mL of 40 mg per cc of kenalog into the right CMC joint.  Patient tolerated the procedure well. Post injection instructions were given. Patient was set up for her return visit. Medictions were also refilled.  F/U in one month.

## 2012-01-16 NOTE — Patient Instructions (Signed)
  Follow up in one month   Joint Injection Care After Refer to this sheet in the next few days. These instructions provide you with information on caring for yourself after you have had a joint injection. Your caregiver also may give you more specific instructions. Your treatment has been planned according to current medical practices, but problems sometimes occur. Call your caregiver if you have any problems or questions after your procedure. After any type of joint injection, it is not uncommon to experience:  Soreness, swelling, or bruising around the injection site.   Mild numbness, tingling, or weakness around the injection site caused by the numbing medicine used before or with the injection.  It also is possible to experience the following effects associated with the specific agent after injection:  Iodine-based contrast agents:   Allergic reaction (itching, hives, widespread redness, and swelling beyond the injection site).   Corticosteroids (These effects are rare.):   Allergic reaction.   Increased blood sugar levels (If you have diabetes and you notice that your blood sugar levels have increased, notify your caregiver).   Increased blood pressure levels.   Mood swings.   Hyaluronic acid in the use of viscosupplementation.   Temporary heat or redness.   Temporary rash and itching.   Increased fluid accumulation in the injected joint.  These effects all should resolve within a day after your procedure.  HOME CARE INSTRUCTIONS  Limit yourself to light activity the day of your procedure. Avoid lifting heavy objects, bending, stooping, or twisting.   Take prescription or over-the-counter pain medication as directed by your caregiver.   You may apply ice to your injection site to reduce pain and swelling the day of your procedure. Ice may be applied 3 to 4 times:   Put ice in a plastic bag.   Place a towel between your skin and the bag.   Leave the ice on for no  longer than 15 to 20 minutes each time.  SEEK IMMEDIATE MEDICAL CARE IF:   Pain and swelling get worse rather than better or extend beyond the injection site.   Numbness does not go away.   Blood or fluid continues to leak from the injection site.   You have chest pain.   You have swelling of your face or tongue.   You have trouble breathing or you become dizzy.   You develop a fever, chills, or severe tenderness at the injection site that last longer than 1 day.  MAKE SURE YOU:  Understand these instructions.   Watch your condition.   Get help right away if you are not doing well or if you get worse.  Document Released: 05/10/2011 Document Revised: 08/16/2011 Document Reviewed: 05/10/2011 [facility] Patient Information 2012 [place], [place].

## 2012-01-17 ENCOUNTER — Other Ambulatory Visit: Payer: Self-pay | Admitting: *Deleted

## 2012-01-17 ENCOUNTER — Other Ambulatory Visit: Payer: Self-pay | Admitting: Internal Medicine

## 2012-01-17 MED ORDER — ESTROGENS CONJUGATED 0.625 MG PO TABS: ORAL_TABLET | ORAL | Status: DC

## 2012-01-17 NOTE — Telephone Encounter (Signed)
[facility] pharmacy needs refill on Premarin 0.625mg  #100 - take one tablet by mouth every day - last refill 10/04/11. Please change order to Premarin instead Vivelle-dot.

## 2012-01-18 NOTE — Progress Notes (Signed)
Rx called in to pharmacy. 

## 2012-01-23 ENCOUNTER — Other Ambulatory Visit: Payer: Self-pay

## 2012-01-23 MED ORDER — GABAPENTIN 300 MG PO CAPS: 300.0000 mg | ORAL_CAPSULE | Freq: Four times a day (QID) | ORAL | Status: DC

## 2012-01-30 ENCOUNTER — Other Ambulatory Visit: Payer: Self-pay

## 2012-01-30 MED ORDER — GABAPENTIN 300 MG PO CAPS: 400.0000 mg | ORAL_CAPSULE | Freq: Four times a day (QID) | ORAL | Status: DC

## 2012-02-13 ENCOUNTER — Encounter: Payer: Self-pay | Admitting: Physical Medicine and Rehabilitation

## 2012-02-13 ENCOUNTER — Encounter: Payer: Medicaid Other | Attending: Neurosurgery | Admitting: Physical Medicine and Rehabilitation

## 2012-02-13 VITALS — BP 139/91 | HR 84 | Ht 64.0 in | Wt 116.0 lb

## 2012-02-13 DIAGNOSIS — M545 Low back pain, unspecified: Secondary | ICD-10-CM

## 2012-02-13 DIAGNOSIS — M25559 Pain in unspecified hip: Secondary | ICD-10-CM | POA: Insufficient documentation

## 2012-02-13 DIAGNOSIS — M47816 Spondylosis without myelopathy or radiculopathy, lumbar region: Secondary | ICD-10-CM

## 2012-02-13 DIAGNOSIS — G8929 Other chronic pain: Secondary | ICD-10-CM

## 2012-02-13 DIAGNOSIS — M25551 Pain in right hip: Secondary | ICD-10-CM

## 2012-02-13 DIAGNOSIS — M19049 Primary osteoarthritis, unspecified hand: Secondary | ICD-10-CM

## 2012-02-13 DIAGNOSIS — M47817 Spondylosis without myelopathy or radiculopathy, lumbosacral region: Secondary | ICD-10-CM

## 2012-02-13 DIAGNOSIS — M542 Cervicalgia: Secondary | ICD-10-CM

## 2012-02-13 MED ORDER — GABAPENTIN 300 MG PO CAPS: 600.0000 mg | ORAL_CAPSULE | Freq: Three times a day (TID) | ORAL | Status: DC

## 2012-02-13 MED ORDER — OXYCODONE HCL 5 MG PO TABS: 5.0000 mg | ORAL_TABLET | Freq: Two times a day (BID) | ORAL | Status: DC | PRN

## 2012-02-13 NOTE — Patient Instructions (Signed)
Please apply a cold pack to your right hip over the next 24 hours for 20 minutes several times a day.  I will see you back in one month  These keep your pain medications locked up and in a secure location  I have ordered x-rays of your hip

## 2012-02-13 NOTE — Progress Notes (Signed)
Subjective:    Patient ID: Amanda Bennett, female    DOB: 1955-10-18, 56 y.o.   MRN: [id number]  HPI  [facility] joint injection 01/16/2012 here for follow up visit after injection.  Pain score before injection pain was 8/10 and is now a 6/10.  The patient is a 56 year old woman who is [facility]. [facility] for chronic pain complaints that are related to her neck in her low back.   She also has a history of intermittent hand numbness and elected diagnostic studies last spring showed mild carpal tunnel syndrome.  Using night splints for this with beneficial reports  She reports continued pain in her right thumb.    She recently underwent gallbladder surgery on 10/30/2011 Dr. Margaree Mackintosh  .In the interim she has also been diagnosed with osteoporosis and her PCP may be starting her on some medication for this.  Last fall she saw Dr. Venetia Maxon and cervical MRI and lumbar MRI were ordered. A surgical option was not presented.  Previously she had been on clonazepam and Percocet  Her primary care doctor took her off clonazepam before her surgery and she has weaned herself off of her Percocet.    Her chief complaint today is an exacerbation of her low back pain. Her pain is located across the lumbosacral junction. The right hip is more involved in the left hip at this time. When she is up walking she is also noting some shooting pain from the right foot into the lower leg.  She denies a history of a motor vehicle accidents or falls or injuries. About 2 weeks ago she developed a gradual worsening of this pre-existing pain. She has called her neurosurgeon Dr. Venetia Maxon and has an appointment to see him on June 10.  The pain is described as a very deep ache located in the predominantly right pelvic area. She also notes some intermittent numbness and tingling. There has been no weakness noted. She's been using her cane because her right leg/hip has been bothering her.   There is no  history of bladder incontinence, no history of bowel incontinence.  Is been usingFlector patches which seemed to take the edge of slightly.   Pain Inventory Average Pain 10 Pain Right Now 10 My pain is sharp, burning, dull, stabbing, tingling and aching  In the last 24 hours, has pain interfered with the following? General activity 10 Relation with others 10 Enjoyment of life 10 What TIME of day is your pain at its worst? all the time Sleep (in general) Poor  Pain is worse with: inactivity and some activites Pain improves with: medication Relief from Meds: 2  Mobility walk with assistance use a cane how many minutes can you walk? 10 min ability to climb steps?  no do you drive?  yes Do you have any goals in this area?  yes  Function disabled: date disabled  Do you have any goals in this area?  yes  Neuro/Psych bladder control problems bowel control problems weakness numbness tremor tingling trouble walking spasms depression anxiety  Prior Studies Any changes since last visit?  yes  Physicians involved in your care Any changes since last visit?  yes   Family History  Problem Relation Age of Onset  . Stroke Neg Hx   . Stomach cancer Neg Hx   . Cancer Father     colon/prostate  . Colon cancer Father 75    deceased at age 24 from colon cancer  . Cancer Paternal  Grandmother     ovarian   History   Social History  . Marital Status: Single    Spouse Name: N/A    Number of Children: N/A  . Years of Education: N/A   Social History Main Topics  . Smoking status: Current Everyday Smoker -- 0.5 packs/day for 20 years    Types: Cigarettes    Last Attempt to Quit: 01/08/2011  . Smokeless tobacco: Never Used   Comment: Wants to tr Chantix  . Alcohol Use: No  . Drug Use: No  . Sexually Active: No   Other Topics Concern  . None   Social History Narrative   Financial assistance approved for 100% discount at [facility] and has [facility] card, also [place].  per Rudell Cobb 12/09/2009   Past Surgical History  Procedure Date  . Abdominal hysterectomy   . Finger amputated     right ring  . Drain in left ring finger     cat bite  . Broken clavical   . Cholecystectomy   . Hemorrhoid surgery 11/25/2006  . Cholecystectomy 10/30/2011    Procedure: LAPAROSCOPIC CHOLECYSTECTOMY WITH INTRAOPERATIVE CHOLANGIOGRAM;  Surgeon: Wilmon Arms. Corliss Skains, MD;  Location: [facility];  Service: General;  Laterality: N/A;   Past Medical History  Diagnosis Date  . Foot fracture, right 07/2010    per x-ray 07/2010 - oblique comminuted fifth metatarsal fracture, followed by Dr. Patsy Lager  . Depression   . Insomnia   . GERD (gastroesophageal reflux disease)   . Carpal tunnel syndrome   . Cervical spinal stenosis 2008    per x-ray findings 2008  . Pulmonary nodule, right     RUL nodule, 7 mm noted on Chest XRAY - 06/2008, per CT follow up in 2009 - No suspicious pulmonary nodules or masse noted  . Cervicalgia   . Pain in limb   . Lumbago   . Chronic pain syndrome   . Cervical syndrome   . Primary localized osteoarthrosis, pelvic region and thigh   . Lesion of ulnar nerve   . Anxiety   . Osteoporosis   . Unexplained weight loss   . Visual disturbance   . Cough   . Arthritis pain   . Bruises easily   . Wound disruption, post-op, skin   . PONV (postoperative nausea and vomiting)     shortness of breath   . Hepatitis     hx of Hep C    BP 139/91  Pulse 84  Ht 5\' 4"  (1.626 m)  Wt 116 lb (52.617 kg)  BMI 19.91 kg/m2  SpO2 98%  LMP 10/23/2002     Review of Systems  Constitutional: Positive for diaphoresis and unexpected weight change.  Respiratory: Positive for shortness of breath and wheezing.   Gastrointestinal: Positive for nausea, abdominal pain, diarrhea and constipation.  Neurological: Positive for weakness and numbness.  Psychiatric/Behavioral: Positive for dysphoric mood.  All other systems reviewed and are negative.         Objective:   Physical Exam   The patient is a well-developed thin adult female who does not appear in any distress. She is oriented x3 her speech is clear her affect is bright and she's alert and cooperative and pleasant. She follows commands without difficulty answers questions appropriately  Cranial nerves and coordination are grossly intact. Her reflexes are 2+ in the upper extremities.  Achilles tendon 2+ in the left lower extremity right lower is 0.   2+ at the knee without abnormal  tone clonus or tremors.   From 4 strength is generally good in both upper and lower extremities.ROM is intact in the upper extremities.   She reports some decreased sensation in the dorsum of the right foot.  She transitions slowly from sitting to standing. Her gait is a antalgic. She has limitations in cervical range of motion mildly.   Lumbar motion is without significant pain with forward flexion. Extension exacerbates pain and returning up from a flexed position also exacerbates low back pain.  Bilateral hips are evaluated. She has tenderness over the right greater trochanter region somewhat down the iliotibial band as well  She reports some pain in the groin and posterior hip with internal and external rotation of the right hip.   She has significant first carpal metacarpal joint deformity on the right tenderness is noted over the [facility] joint. This improved from previous visit. Some involvement as noted on the left but much milder.  She has a healing periumbilical incision.    "06/07/11 lumbar MRI  Marrow signal homogeneous. Mild  disc desiccation and loss of disc height at L3-4. Normal conus.  No disc protrusion or spinal stenosis. Mild lower lumbar facet  arthropathy. No extraforaminal protrusion or significant foraminal  narrowing. Unremarkable paraspinous soft tissues.  IMPRESSION:  Mild lumbar spondylosis. No compressive lesion."       Assessment & Plan:   1. Neck pain with known  cervical spine spondylosis  2. Bilateral hand pain  3. Right thumb CMC joint OA  4. Mild bilateral carpal tunnel syndrome he continues to use night splints for this problem, she reports this seems to help  5. Low back pain exacerbated, I've encouraged her to followup with Dr. Venetia Maxon she already has an appointment on Monday. She is having some right leg pain. She has a decreased reflex at the right Achilles tendon and decreased sensation in the dorsum of the foot as well as the plantar surface.  She has a history of previous pelvic fracture. Would like to obtain hip radiographs to rule out any new problems.  Our goals are to minimize narcotic pain medication and encourage function.  6. greater trochanteric bursitis right hip, Treatment options discussed. Patient is requesting right hip injection today.  She finds the gabapentin has been helpful. She is currently taking 600 mg twice a day. I've asked her what she doesn't take an extra dose but she tells me it is deceased today being and she wants to get active again.  She's in contact with primary care for management of her osteoporosis.  She is interested in getting started in a exercise routine in the upcoming months.  She is having will put in her backyard this week.  I recommend to start water walking avoiding positions that put her neck her low back into extension. She verbalizes understanding  I will start her back on oxycodone 5 mg/325 one per day #30 for a short period of time bat is 1 month. She understands that this be a short term medication.  She understands the risks and benefits of narcotics. She will not be using machinery or driving while on this medication will not use alcohol  She will keep them locked up in a secure location.      Indication: Hip pain not relieved by medical management and other conservative care  Informed consent was obtained after describing risks and benefits of the procedure with the patient,, this  includes bleeding, bruising, infection and medication side effects.  The patient  wishes to proceed and has given written consent  The patient was placed in a lateral decubitus position. The lateral aspect of the greater trochanter was marked and prepped with betadine and  alcohol. A 21-gauge needles was inserted into the lateral hip (greater trochanter area ).  After negative draw back for blood, solution containing one milligram of 40 mg per milliliter kenalog and 4 milliliters of 1% lidocaine were injected. Patient tolerated the procedure well  post  injections instructions were given.  Pain scores of the lateral hip went from 10 over 10 down to 7/10  Follow up in one month. Pain medication refilled. She continues to use gabapentin 600 mg 3 times a day, oxycodone 5 mg twice a day

## 2012-03-17 ENCOUNTER — Encounter: Payer: Self-pay | Admitting: Physical Medicine and Rehabilitation

## 2012-03-17 ENCOUNTER — Encounter: Payer: Medicaid Other | Attending: Neurosurgery | Admitting: Physical Medicine and Rehabilitation

## 2012-03-17 VITALS — BP 148/95 | HR 76 | Resp 16 | Wt 115.0 lb

## 2012-03-17 DIAGNOSIS — G8929 Other chronic pain: Secondary | ICD-10-CM

## 2012-03-17 DIAGNOSIS — M545 Low back pain, unspecified: Secondary | ICD-10-CM | POA: Insufficient documentation

## 2012-03-17 DIAGNOSIS — M542 Cervicalgia: Secondary | ICD-10-CM

## 2012-03-17 DIAGNOSIS — M47817 Spondylosis without myelopathy or radiculopathy, lumbosacral region: Secondary | ICD-10-CM

## 2012-03-17 DIAGNOSIS — M25559 Pain in unspecified hip: Secondary | ICD-10-CM

## 2012-03-17 DIAGNOSIS — M19049 Primary osteoarthritis, unspecified hand: Secondary | ICD-10-CM

## 2012-03-17 DIAGNOSIS — M76899 Other specified enthesopathies of unspecified lower limb, excluding foot: Secondary | ICD-10-CM | POA: Insufficient documentation

## 2012-03-17 DIAGNOSIS — M47816 Spondylosis without myelopathy or radiculopathy, lumbar region: Secondary | ICD-10-CM

## 2012-03-17 DIAGNOSIS — M7061 Trochanteric bursitis, right hip: Secondary | ICD-10-CM

## 2012-03-17 MED ORDER — OXYCODONE HCL 5 MG PO TABS: 5.0000 mg | ORAL_TABLET | Freq: Two times a day (BID) | ORAL | Status: DC | PRN

## 2012-03-17 NOTE — Progress Notes (Signed)
Subjective:    Patient ID: Amanda Bennett, female    DOB: Oct 12, 1955, 56 y.o.   MRN: [id number]  HPI   [facility] joint injection 01/16/2012 here for follow up visit after injection. Pain score before injection pain was 8/10 and is now a 6/10.    The patient is a 56 year old woman who is followed. [facility] for chronic pain complaints that are related to her neck in her low back.   She also has a history of intermittent hand numbness and electroiagnostic studies last spring showed mild carpal tunnel syndrome.  Using night splints for this with beneficial reports    She reports continued pain in her right thumb. 4/10 scale  She recently underwent gallbladder surgery on 10/30/2011 Dr. Karie Schwalbe pain [facility]   In the interim she has also been diagnosed with osteoporosis and her PCP may be starting her on some medication for this.    Last fall she saw Dr. Venetia Maxon and cervical MRI and lumbar MRI were ordered. A surgical option was not presented.     Her chief complaint today is right lateral hip pain which radiates into the lateral thigh. This pain has improved since last visit. She tells me the injection helped her pain about 50%.  She also has some low back pain which is unchanged.    She also notes some intermittent numbness and tingling intermittntly in r lower extremity. There has been no weakness noted. She's been using her cane because her right leg/hip has been bothering her.  There is no history of bladder incontinence, no history of bowel incontinence.   Is been usingFlector patches which seemed to take the edge of slightly.     Pain Inventory Average Pain 7 Pain Right Now 7 My pain is constant, sharp, burning, dull, stabbing, tingling and aching  In the last 24 hours, has pain interfered with the following? General activity 10 Relation with others 10 Enjoyment of life 10 What TIME of day is your pain at its worst? all of the time Sleep (in  general) Poor  Pain is worse with: walking, bending, sitting, inactivity and standing Pain improves with: rest, pacing activities, medication and TENS Relief from Meds: 5  Mobility use a cane how many minutes can you walk? 5  ability to climb steps?  yes do you drive?  yes  Function disabled: date disabled  I need assistance with the following:  household duties and shopping  Neuro/Psych weakness numbness tremor tingling trouble walking spasms depression anxiety  Prior Studies Any changes since last visit?  no  Physicians involved in your care Any changes since last visit?  no   Family History  Problem Relation Age of Onset  . Stroke Neg Hx   . Stomach cancer Neg Hx   . Cancer Father     colon/prostate  . Colon cancer Father 64    deceased at age 67 from colon cancer  . Cancer Paternal Grandmother     ovarian   History   Social History  . Marital Status: Single    Spouse Name: N/A    Number of Children: N/A  . Years of Education: N/A   Social History Main Topics  . Smoking status: Current Everyday Smoker -- 0.5 packs/day for 20 years    Types: Cigarettes    Last Attempt to Quit: 01/08/2011  . Smokeless tobacco: Never Used   Comment: Wants to tr Chantix  . Alcohol Use: No  . Drug Use:  No  . Sexually Active: No   Other Topics Concern  . None   Social History Narrative   Financial assistance approved for 100% discount at [facility] and has [facility] card, also given [place] card.  per Rudell Cobb 12/09/2009   Past Surgical History  Procedure Date  . Abdominal hysterectomy   . Finger amputated     right ring  . Drain in left ring finger     cat bite  . Broken clavical   . Cholecystectomy   . Hemorrhoid surgery 11/25/2006  . Cholecystectomy 10/30/2011    Procedure: LAPAROSCOPIC CHOLECYSTECTOMY WITH INTRAOPERATIVE CHOLANGIOGRAM;  Surgeon: Wilmon Arms. Corliss Skains, MD;  Location: [facility];  Service: General;  Laterality: N/A;   Past Medical History  Diagnosis  Date  . Foot fracture, right 07/2010    per x-ray 07/2010 - oblique comminuted fifth metatarsal fracture, followed by Dr. Patsy Lager  . Depression   . Insomnia   . GERD (gastroesophageal reflux disease)   . Carpal tunnel syndrome   . Cervical spinal stenosis 2008    per x-ray findings 2008  . Pulmonary nodule, right     RUL nodule, 7 mm noted on Chest XRAY - 06/2008, per CT follow up in 2009 - No suspicious pulmonary nodules or masse noted  . Cervicalgia   . Pain in limb   . Lumbago   . Chronic pain syndrome   . Cervical syndrome   . Primary localized osteoarthrosis, pelvic region and thigh   . Lesion of ulnar nerve   . Anxiety   . Osteoporosis   . Unexplained weight loss   . Visual disturbance   . Cough   . Arthritis pain   . Bruises easily   . Wound disruption, post-op, skin   . PONV (postoperative nausea and vomiting)     shortness of breath   . Hepatitis     hx of Hep C    BP 148/95  Pulse 76  Resp 16  Wt 115 lb (52.164 kg)  SpO2 98%  LMP 10/23/2002    Review of Systems  Constitutional: Positive for diaphoresis, appetite change and unexpected weight change.  Respiratory: Positive for cough, shortness of breath and wheezing.   Gastrointestinal: Positive for diarrhea and constipation.  Musculoskeletal:       Neck pain, hip pain  Neurological: Positive for tremors, weakness and numbness.       Tingling  Hematological: Bruises/bleeds easily.  All other systems reviewed and are negative.       Objective:   Physical Exam  The patient is a well-developed thin adult female who does not appear in any distress. She is oriented x3 her speech is clear her affect is bright and she's alert and cooperative and pleasant. She follows commands without difficulty answers questions appropriately  Cranial nerves and coordination are grossly intact.   Her reflexes are 2+ in the upper extremities.  Achilles tendon 2+ in the left lower extremity right lower is 0.  2+ at the  knee without abnormal tone clonus or tremors.    Strength is generally good in both upper and lower extremities.   ROM is intact in the upper extremities.  She reports some decreased sensation in the dorsum of the right foot.   She transitions slowly from sitting to standing. Her gait is a antalgic. She has limitations in cervical range of motion mildly.   Lumbar motion is without significant pain with forward flexion. Extension exacerbates pain and returning up from a flexed  position also exacerbates low back pain.    Bilateral hips are evaluated. She has tenderness over the right greater trochanter region somewhat down the iliotibial band as well    She reports some pain in the groin and posterior hip with internal and external rotation of the right hip.    She has significant first carpal metacarpal joint deformity on the right tenderness is noted over the [facility] joint. This improved from previous visit. Some involvement as noted on the left but much milder.    She has a healing periumbilical incision.  "06/07/11 lumbar MRI  Marrow signal homogeneous. Mild  disc desiccation and loss of disc height at L3-4. Normal conus.  No disc protrusion or spinal stenosis. Mild lower lumbar facet  arthropathy. No extraforaminal protrusion or significant foraminal  narrowing. Unremarkable paraspinous soft tissues.  IMPRESSION:  Mild lumbar spondylosis. No compressive lesion."          Assessment & Plan:  1. Neck pain with known cervical spine spondylosis   2. Bilateral hand pain: Would like to have patient see occupational therapy for education on joint protection techniques.  3. Right thumb CMC joint OA   4. Mild bilateral carpal tunnel syndrome continues to use night splints for this problem, she reports this seems to help    5. Low back pain exacerbated, I've encouraged her to followup with Dr. Venetia Maxon. She has a history of previous pelvic fracture. Would like to obtain hip  radiographs to rule out any new problems.   Our goals are to minimize narcotic pain medication and encourage function.   She has not made a followup appointment with Dr. Venetia Maxon. She is waiting to hear back from the clinic regarding an appointment date.   6. Greater trochanteric bursitis right hip: Injection last visit helped leg pain about 50%. She still having some greater trochanter or pain which is located just slightly posterior to the greater trochanter at this time. She would like to consider repeat injection and we will set this up for her next visit.  Have also encouraged physical therapy to specifically address muscle imbalances around right hip.  As well as tightness of the IT band.  Over the next few months we'll reassess and we also discussed consideration of sacroiliac joint injection on the right as well.   She finds the gabapentin has been helpful. She is currently taking 600 mg twice a day. I've asked her what she doesn't take an extra dose but she tells me it is deceased today being and she wants to get active again.     She's in contact with primary care for management of her osteoporosis.   She is interested in getting started in a exercise routine in the upcoming months.   I recommend to start water walking avoiding positions that put her neck her low back into extension. She verbalizes understanding   I will start her back on oxycodone 5 mg/325 one per day #30 for a short period of time one more month.She understands that this be a short term medication.   Next month anticipate 10 day supply rather than 30 day supply. She will decided she wants to continue tramadol again.  She understands the risks and benefits of narcotics. She will not be using machinery or driving while on this medication will not use alcohol   She will keep them locked up in a secure location.   Followup with primary care for her elevated BP Neurosurgeon rescheduled her appt.  Low one  month with Jair Lindblad for right hip injection.

## 2012-03-17 NOTE — Patient Instructions (Addendum)
Followup in one month with Amanda Bennett for right trochanter injection  Please make sure you follow up with primary care regarding your blood pressure and dizziness   These keep your pain medications locked up and in a secure location caution regarding operating equipment or vehicles will taking these medications.    Followup PCP for her elevated BPSacroiliac Joint Dysfunction The sacroiliac joint connects the lower part of the spine (the sacrum) with the bones of the pelvis. CAUSES  Sometimes, there is no obvious reason for sacroiliac joint dysfunction. Other times, it may occur   During pregnancy.   After injury, such as:   Car accidents.   Sport-related injuries.   Work-related injuries.   Due to one leg being shorter than the other.   Due to other conditions that affect the joints, such as:   Rheumatoid arthritis.   Gout.   Psoriasis.   Joint infection (septic arthritis).  SYMPTOMS  Symptoms may include:  Pain in the:   Lower back.   Buttocks.   Groin.   Thighs and legs.   Difficult sitting, standing, walking, lying, bending or lifting.  DIAGNOSIS  A number of tests may be used to help diagnose the cause of sacroiliac joint dysfunction, including:  Imaging tests to look for other causes of pain, including:   MRI.   CT scan.   Bone scan.   Diagnostic injection: During a special x-ray (called fluoroscopy), a needle is put into the sacroiliac joint. A numbing medicine is injected into the joint. If the pain is improved or stopped, the diagnosis of sacroiliac joint dysfunction is more likely.  TREATMENT  There are a number of types of treatment used for sacroiliac joint dysfunction, including:  Only take over-the-counter or prescription medicines for pain, discomfort, or fever as directed by your caregiver.   Medications to relax muscles.   Rest. Decreasing activity can help cut down on painful muscle spasms and allow the back to heal.   Application  of heat or ice to the lower back may improve muscle spasms and soothe pain.   Brace. A special back brace, called a sacroiliac belt, can help support the joint while your back is healing.   Physical therapy can help teach comfortable positions and exercises to strengthen muscles that support the sacroiliac joint.   Cortisone injections. Injections of steroid medicine into the joint can help decrease swelling and improve pain.   Hyaluronic acid injections. This chemical improves lubrication within the sacroiliac joint, thereby decreasing pain.   Radiofrequency ablation. A special needle is placed into the joint, where it burns away nerves that are carrying pain messages from the joint.   Surgery. Because pain occurs during movement of the joint, screws and plates may be installed in order to limit or prevent joint motion.  HOME CARE INSTRUCTIONS   Take all medications exactly as directed.   Follow instructions regarding both rest and physical activity, to avoid worsening the pain.   Do physical therapy exercises exactly as prescribed.  SEEK IMMEDIATE MEDICAL CARE IF:  You experience increasingly severe pain.   You develop new symptoms, such as numbness or tingling in your legs or feet.   You lose bladder or bowel control.  Document Released: 11/23/2008 Document Revised: 08/16/2011 Document Reviewed: 11/23/2008 [facility] Patient Information 2012 [place], [place].

## 2012-03-19 ENCOUNTER — Ambulatory Visit: Payer: Medicaid Other | Admitting: Physical Medicine and Rehabilitation

## 2012-04-01 ENCOUNTER — Encounter: Payer: Medicaid Other | Admitting: Internal Medicine

## 2012-04-08 ENCOUNTER — Encounter: Payer: Self-pay | Admitting: Internal Medicine

## 2012-04-08 ENCOUNTER — Ambulatory Visit (HOSPITAL_COMMUNITY)
Admission: RE | Admit: 2012-04-08 | Discharge: 2012-04-08 | Disposition: A | Discharge status: Home / Self Care | Payer: Medicaid Other | Source: Ambulatory Visit | Attending: Physical Medicine and Rehabilitation | Admitting: Physical Medicine and Rehabilitation

## 2012-04-08 ENCOUNTER — Ambulatory Visit (INDEPENDENT_AMBULATORY_CARE_PROVIDER_SITE_OTHER): Payer: Medicaid Other | Admitting: Internal Medicine

## 2012-04-08 VITALS — BP 154/90 | HR 75 | Temp 97.2°F | Ht 64.0 in | Wt 114.0 lb

## 2012-04-08 DIAGNOSIS — M25559 Pain in unspecified hip: Secondary | ICD-10-CM

## 2012-04-08 DIAGNOSIS — I10 Essential (primary) hypertension: Secondary | ICD-10-CM

## 2012-04-08 DIAGNOSIS — M25551 Pain in right hip: Secondary | ICD-10-CM

## 2012-04-08 DIAGNOSIS — E785 Hyperlipidemia, unspecified: Secondary | ICD-10-CM

## 2012-04-08 DIAGNOSIS — G4700 Insomnia, unspecified: Secondary | ICD-10-CM

## 2012-04-08 DIAGNOSIS — R51 Headache: Secondary | ICD-10-CM

## 2012-04-08 DIAGNOSIS — Z9223 Personal history of estrogen therapy: Secondary | ICD-10-CM

## 2012-04-08 DIAGNOSIS — N951 Menopausal and female climacteric states: Secondary | ICD-10-CM

## 2012-04-08 DIAGNOSIS — M810 Age-related osteoporosis without current pathological fracture: Secondary | ICD-10-CM

## 2012-04-08 DIAGNOSIS — F329 Major depressive disorder, single episode, unspecified: Secondary | ICD-10-CM

## 2012-04-08 DIAGNOSIS — F3289 Other specified depressive episodes: Secondary | ICD-10-CM

## 2012-04-08 DIAGNOSIS — K219 Gastro-esophageal reflux disease without esophagitis: Secondary | ICD-10-CM

## 2012-04-08 LAB — COMPLETE METABOLIC PANEL WITHOUT GFR
ALT: 67 U/L — ABNORMAL HIGH (ref 0–35)
AST: 81 U/L — ABNORMAL HIGH (ref 0–37)
Albumin: 4.7 g/dL (ref 3.5–5.2)
Alkaline Phosphatase: 128 U/L — ABNORMAL HIGH (ref 39–117)
BUN: 8 mg/dL (ref 6–23)
CO2: 27 meq/L (ref 19–32)
Calcium: 10 mg/dL (ref 8.4–10.5)
Chloride: 103 meq/L (ref 96–112)
Creat: 0.56 mg/dL (ref 0.50–1.10)
GFR, Est African American: >89 mL/min
GFR, Est Non African American: >89 mL/min
Glucose, Bld: 86 mg/dL (ref 70–99)
Potassium: 4.4 meq/L (ref 3.5–5.3)
Sodium: 138 meq/L (ref 135–145)
Total Bilirubin: 0.5 mg/dL (ref 0.3–1.2)
Total Protein: 8.3 g/dL (ref 6.0–8.3)

## 2012-04-08 LAB — LIPID PANEL
Cholesterol: 255 mg/dL — ABNORMAL HIGH (ref 0–200)
HDL: 43 mg/dL
Total CHOL/HDL Ratio: 5.9 ratio
Triglycerides: 408 mg/dL — ABNORMAL HIGH

## 2012-04-08 MED ORDER — TRAZODONE 25 MG HALF TABLET: ORAL_TABLET | ORAL | Status: DC

## 2012-04-08 MED ORDER — AMLODIPINE BESYLATE 2.5 MG PO TABS: 2.5000 mg | ORAL_TABLET | Freq: Every day | ORAL | Status: DC

## 2012-04-08 NOTE — Patient Instructions (Signed)
--  Take 1/2 tablet of your estrogen pill --Start taking amlodipine/Norvasc 2.5 mg for your blood pressure --Continue taking Boost or Ensure for your weight --Stop taking Fosamax until your next visit --Take one quarter of a tablet of your trazodone to help you sleep . --You may call 1800-QUIT-NOW for additional help with quitting smoking.  --Have your X-ray done today or tomorrow and follow-up with the pain clinic.  --Follow up with [place] in 2-3 weeks, preferably with Dr. Anselm Jungling

## 2012-04-09 DIAGNOSIS — M7071 Other bursitis of hip, right hip: Secondary | ICD-10-CM | POA: Insufficient documentation

## 2012-04-09 DIAGNOSIS — R51 Headache: Secondary | ICD-10-CM | POA: Insufficient documentation

## 2012-04-09 DIAGNOSIS — E785 Hyperlipidemia, unspecified: Secondary | ICD-10-CM | POA: Insufficient documentation

## 2012-04-09 DIAGNOSIS — R519 Headache, unspecified: Secondary | ICD-10-CM | POA: Insufficient documentation

## 2012-04-09 DIAGNOSIS — I10 Essential (primary) hypertension: Secondary | ICD-10-CM | POA: Insufficient documentation

## 2012-04-09 NOTE — Assessment & Plan Note (Signed)
Pt able to sleep well on trazodone but feels decreased concentration the next day. Pt will attempt to decrease dose to 12.5 mg by taking one quarter of a 50mg  tablet. Will reassess during next visit.

## 2012-04-09 NOTE — Addendum Note (Signed)
Addended by: Sara Chu D on: 04/09/2012 10:04 AM   Modules accepted: Orders

## 2012-04-09 NOTE — Assessment & Plan Note (Signed)
Pt aware that 29% of women on HRT report headache as a side effect. Pt's last eye exam was 6 months ago, with right nerve damage but no glaucoma. Advised pt to follow up with her eye doctor if flashing lights happen more often. Advised pt to seek immediate medical attention if her eyes become painful.

## 2012-04-09 NOTE — Assessment & Plan Note (Addendum)
Pt tried estrogen patch w no improvement of hot flashes. Currently on Premarin 0.625mg . Pt reports considerably decreased hot flashes. She is s/p full hysterectomy so no risk for endometrial cancer but she is still at risk for breast cancer. Pt is also a current smoker, increasing her risk for blood clots. In addition, pt has lost weight recently, which could lead to decreased adipose tissue and decreased estrogen levels.   --Advised pt to decrease Premarin to 0.3mg  by taking half the tablet of her 0.625mg  dose.  --Advised pt to maintain her weight by restarting Ensure or Boost to prevent fluctuation of her estrogen levels and worsening of her hot flashes.

## 2012-04-09 NOTE — Progress Notes (Signed)
  Subjective:    Patient ID: Amanda Bennett, female    DOB: 1955/12/23, 56 y.o.   MRN: [id number]  HPI Amanda Bennett is a 56 year-old woman with PMH significant for hepatitis C, osteoporosis, depression, insomnia, menopausal hot flashes, and chronic lower back pain who comes in today for evaluation of high blood pressure and multiple complaints. For the past few months she has noticed that her BP is increasingly elevated when she goes see her providers at the pain clinic. Her BP today is elevated at 154/90. She has had headaches with dizziness almost every morning that last 10-15 minutes and are followed by brief flashes of light in both eyes at least once per week.   For the last 3-4 months she has experienced generalized numbness in her body that come and go and last for a few minutes.   She has had right hip pain, with point tenderness that is worse with weight bearing.  She has stopped taking her Fosamax for almost 1 month now because of severe burning sensation in her esophagus. Since stopping this medication her acid reflux/burning sensation has completely subsided.   Her depression has improved since increasing her Cymbalta dose to 40mg  daily but she continues to have difficulty staying asleep. She has taken trazadone 25mg  with improvement of her sleep with drowsiness and decreased concentration. She has tried Benadryl, melanin, Ambien, and Advil PM with no improved sleep.   Her interferon therapy is scheduled to start later this year. She wants to wait until her current medical problems have improved before starting her hep C treatment.    Review of Systems  Constitutional: Negative for fever, activity change, appetite change, fatigue and unexpected weight change.  HENT: Positive for neck pain.   Eyes: Positive for visual disturbance. Negative for photophobia and pain.  Respiratory: Negative for cough and shortness of breath.   Cardiovascular: Negative for chest pain.  Gastrointestinal:  Negative for abdominal pain.  Musculoskeletal: Positive for back pain and arthralgias.  Neurological: Positive for dizziness, light-headedness, numbness and headaches. Negative for tremors, seizures, syncope, facial asymmetry, speech difficulty and weakness.  Psychiatric/Behavioral: Positive for decreased concentration. Negative for agitation.       Objective:   Physical Exam  Constitutional: She is oriented to person, place, and time. She appears well-developed and well-nourished. No distress.       Thin  HENT:  Head: Normocephalic and atraumatic.  Eyes: Conjunctivae and EOM are normal. Pupils are equal, round, and reactive to light. Right eye exhibits no discharge. Left eye exhibits no discharge. No scleral icterus.  Cardiovascular: Normal rate, regular rhythm, normal heart sounds and intact distal pulses.  Exam reveals no gallop and no friction rub.   No murmur heard. Pulmonary/Chest: Effort normal and breath sounds normal. No respiratory distress. She has no wheezes. She has no rales. She exhibits no tenderness.  Musculoskeletal: She exhibits tenderness. She exhibits no edema.       Tenderness upon palpation of Right greater trochanter. Point tenderness of Right greater trochanter worsened with hip flexion and extension and with weight bearing.     Neurological: She is alert and oriented to person, place, and time. She has normal reflexes.  Skin: Skin is warm and dry. She is not diaphoretic.  Psychiatric: She has a normal mood and affect. Her behavior is normal.          Assessment & Plan:

## 2012-04-09 NOTE — Assessment & Plan Note (Addendum)
Pt has stopped Fosamax secondary to severe esophageal burning/apin. Pt with osteoporosis of Left femur based on last Dexa scan. Pt may benefit from bisphosphonate infusion q65months or once per year at the short stay.  Advised pt to continue taking Calcium and Vitamin D and eating a healthy diet with monosaturated fats, and 3 servings of Calcium rich foods such as yogurt/milk/cheese for adequate absorption of vitamin D and subsequently, Calcium.

## 2012-04-09 NOTE — Assessment & Plan Note (Signed)
Symptoms completely resolved after patient stopped taking Fosamax. Pt would benefit from IV bisphosphate to prevent GI symptoms.

## 2012-04-09 NOTE — Assessment & Plan Note (Signed)
Depression reported as better today. No SI/HI.   --Continue taking Cymbalta 40mg  daily

## 2012-04-09 NOTE — Assessment & Plan Note (Signed)
This problem could be 2/2 estrogen therapy but pt unwilling to terminate HRT.    --Started amlodipine 2.5 mg daily.  --Pt to follow up in 2-3 weeks.

## 2012-04-09 NOTE — Assessment & Plan Note (Addendum)
Pt is s/p steroid injection in that hip for low back pain. Pt has osteoporosis and was taking Fosamax. Concern for hip fracture and atypical femoral fracture.   --Bilateral hip w/ pelvis Xray already ordered by Dr. Pamelia Hoit at the Pain Clinic. Advised pt to have Xray done prior to her follow up appt at the pain clinic.

## 2012-04-09 NOTE — Progress Notes (Signed)
I saw patient and discussed her care with resident Dr. Kennerly.  I agree with the assessment and plans as outlined in her note. 

## 2012-04-10 LAB — LDL CHOLESTEROL, DIRECT: Direct LDL: 136 mg/dL — ABNORMAL HIGH

## 2012-04-11 ENCOUNTER — Encounter: Payer: Self-pay | Admitting: Physical Medicine and Rehabilitation

## 2012-04-11 ENCOUNTER — Encounter: Payer: Medicaid Other | Attending: Neurosurgery | Admitting: Physical Medicine and Rehabilitation

## 2012-04-11 VITALS — BP 123/69 | HR 68 | Resp 16 | Ht 64.0 in | Wt 115.0 lb

## 2012-04-11 DIAGNOSIS — M25559 Pain in unspecified hip: Secondary | ICD-10-CM | POA: Insufficient documentation

## 2012-04-11 DIAGNOSIS — G5600 Carpal tunnel syndrome, unspecified upper limb: Secondary | ICD-10-CM

## 2012-04-11 DIAGNOSIS — M25551 Pain in right hip: Secondary | ICD-10-CM

## 2012-04-11 MED ORDER — OXYCODONE-ACETAMINOPHEN 5-325 MG PO TABS: 1.0000 | ORAL_TABLET | Freq: Every morning | ORAL | Status: DC

## 2012-04-11 NOTE — Progress Notes (Signed)
Amanda Bennett is a 56 year-old woman with PMH significant for hepatitis C, osteoporosis, depression, insomnia, menopausal hot flashes, and chronic lower back pain who comes in today for evaluation of high blood pressure and multiple complaints.  Has had PT to address right hip.  Continues to do home program.  Walking 5 minutes at time 6 times a day. Doing water walking in the pool.  Right hip injection/lateral hip/ greater trochanter using u/s guidance.   Indication: Hip pain not relieved by medical management and other conservative care.   Informed consent was obtained after describing risks and benefits of the procedure with the patient,, this includes bleeding, bruising, infection and medication side effects.  The patient wishes to proceed and has given written consent   The patient was placed in a lateral decubitus position. The lateral aspect of the greater trochanter was marked and prepped with betadine and alcohol. A 21-gauge needles was inserted into the lateral hip (greater trochanter area ).  After negative draw back for blood, solution containing one milligram of 40 mg per milliliter kenalog and 4 milliliters of 1% lidocaine were injected. Patient tolerated the procedure well post injections instructions were given.      Follow up in one month. Pain medication refilled. She continues to use gabapentin 600 mg 3 times a day, oxycodone 5 mg twice a day    If this injection does not provide significant relief over the next month we have discussed getting her involved in a more aggressive physical therapy program targeting right hip musculature. This program would also include deep tissue massage, soft tissue mobilization, ultrasound and icing.

## 2012-04-11 NOTE — Patient Instructions (Signed)
Please ice your hip over the next several days as I have instructed.  If your hip is not improving we have discussed considering more aggressive physical therapy.  This would involve an emphasis in the right hip musculature involving deep tissue massage, soft tissue mobilization, range of motion exercises, strengthening, using ultrasound and other modalities as needed.  You back in a month.  I have refilled your Percocet today.

## 2012-04-15 ENCOUNTER — Telehealth: Payer: Self-pay | Admitting: Physical Medicine and Rehabilitation

## 2012-04-15 NOTE — Telephone Encounter (Signed)
LM for pt to call back.

## 2012-04-15 NOTE — Telephone Encounter (Signed)
Please call about medication.  Cannot take Tylenol

## 2012-04-16 MED ORDER — OXYCODONE HCL 5 MG PO TABS: 5.0000 mg | ORAL_TABLET | Freq: Two times a day (BID) | ORAL | Status: DC | PRN

## 2012-04-16 NOTE — Telephone Encounter (Signed)
The last time she was here Dr. Pamelia Hoit gave her Percocet 5/325 instead of Oxycodone 5mg , which was one previously. She can't take Percocet because it has Tylenol in it. I advised her that we could switch out the medication for her but she would need to bring [place] the medication before we could switch her rx. She agreed and understood. Deneise Lever will be bringing the medication and picking up the new rx, she is sick from taking the Tylenol that she will not be able to drive today.

## 2012-04-21 ENCOUNTER — Telehealth: Payer: Self-pay | Admitting: *Deleted

## 2012-04-21 NOTE — Telephone Encounter (Signed)
Inconsistent UDS. Negative for Oxycodone. Pt was out of her medication several days before the test was done.

## 2012-04-22 ENCOUNTER — Encounter: Payer: Self-pay | Admitting: Internal Medicine

## 2012-04-22 ENCOUNTER — Ambulatory Visit (INDEPENDENT_AMBULATORY_CARE_PROVIDER_SITE_OTHER): Payer: Medicaid Other | Admitting: Internal Medicine

## 2012-04-22 VITALS — BP 138/83 | HR 72 | Temp 98.5°F | Ht 64.0 in | Wt 115.1 lb

## 2012-04-22 DIAGNOSIS — I10 Essential (primary) hypertension: Secondary | ICD-10-CM

## 2012-04-22 DIAGNOSIS — N951 Menopausal and female climacteric states: Secondary | ICD-10-CM

## 2012-04-22 DIAGNOSIS — M810 Age-related osteoporosis without current pathological fracture: Secondary | ICD-10-CM

## 2012-04-22 DIAGNOSIS — K219 Gastro-esophageal reflux disease without esophagitis: Secondary | ICD-10-CM

## 2012-04-22 MED ORDER — ESTROGENS CONJUGATED 0.3 MG PO TABS: 0.3000 mg | ORAL_TABLET | Freq: Every day | ORAL | Status: DC

## 2012-04-22 MED ORDER — ESOMEPRAZOLE MAGNESIUM 40 MG PO CPDR: 40.0000 mg | DELAYED_RELEASE_CAPSULE | Freq: Every day | ORAL | Status: DC

## 2012-04-22 NOTE — Progress Notes (Signed)
Pt aware of appt [facility] 05/06/12 9AM for [place] IV. Stanton Kidney Kenitra Leventhal RN 04/22/12/1:30PM

## 2012-04-22 NOTE — Patient Instructions (Signed)
You were seen for a recheck of your blood pressure which is doing much better. We are going to get you scheduled at short stay for your osteoporosis medication. We will see you back in 3 months if things are going well. If you have questions or problems please call our office at [phone].  Insomnia Insomnia is frequent trouble falling and/or staying asleep. Insomnia can be a long term problem or a short term problem. Both are common. Insomnia can be a short term problem when the wakefulness is related to a certain stress or worry. Long term insomnia is often related to ongoing stress during waking hours and/or poor sleeping habits. Overtime, sleep deprivation itself can make the problem worse. Every little thing feels more severe because you are overtired and your ability to cope is decreased. CAUSES   Stress, anxiety, and depression.   Poor sleeping habits.   Distractions such as TV in the bedroom.   Naps close to bedtime.   Engaging in emotionally charged conversations before bed.   Technical reading before sleep.   Alcohol and other sedatives. They may make the problem worse. They can hurt normal sleep patterns and normal dream activity.   Stimulants such as caffeine for several hours prior to bedtime.   Pain syndromes and shortness of breath can cause insomnia.   Exercise late at night.   Changing time zones may cause sleeping problems (jet lag).  It is sometimes helpful to have someone observe your sleeping patterns. They should look for periods of not breathing during the night (sleep apnea). They should also look to see how long those periods last. If you live alone or observers are uncertain, you can also be observed at a sleep clinic where your sleep patterns will be professionally monitored. Sleep apnea requires a checkup and treatment. Give your caregivers your medical history. Give your caregivers observations your family has made about your sleep.  SYMPTOMS   Not  feeling rested in the morning.   Anxiety and restlessness at bedtime.   Difficulty falling and staying asleep.  TREATMENT   Your caregiver may prescribe treatment for an underlying medical disorders. Your caregiver can give advice or help if you are using alcohol or other drugs for self-medication. Treatment of underlying problems will usually eliminate insomnia problems.   Medications can be prescribed for short time use. They are generally not recommended for lengthy use.   Over-the-counter sleep medicines are not recommended for lengthy use. They can be habit forming.   You can promote easier sleeping by making lifestyle changes such as:   Using relaxation techniques that help with breathing and reduce muscle tension.   Exercising earlier in the day.   Changing your diet and the time of your last meal. No night time snacks.   Establish a regular time to go to bed.   Counseling can help with stressful problems and worry.   Soothing music and white noise may be helpful if there are background noises you cannot remove.   Stop tedious detailed work at least one hour before bedtime.  HOME CARE INSTRUCTIONS   Keep a diary. Inform your caregiver about your progress. This includes any medication side effects. See your caregiver regularly. Take note of:   Times when you are asleep.   Times when you are awake during the night.   The quality of your sleep.   How you feel the next day.  This information will help your caregiver care for you.  Get out of  bed if you are still awake after 15 minutes. Read or do some quiet activity. Keep the lights down. Wait until you feel sleepy and go back to bed.   Keep regular sleeping and waking hours. Avoid naps.   Exercise regularly.   Avoid distractions at bedtime. Distractions include watching television or engaging in any intense or detailed activity like attempting to balance the household checkbook.   Develop a bedtime ritual. Keep a  familiar routine of bathing, brushing your teeth, climbing into bed at the same time each night, listening to soothing music. Routines increase the success of falling to sleep faster.   Use relaxation techniques. This can be using breathing and muscle tension release routines. It can also include visualizing peaceful scenes. You can also help control troubling or intruding thoughts by keeping your mind occupied with boring or repetitive thoughts like the old concept of counting sheep. You can make it more creative like imagining planting one beautiful flower after another in your backyard garden.   During your day, work to eliminate stress. When this is not possible use some of the previous suggestions to help reduce the anxiety that accompanies stressful situations.  MAKE SURE YOU:   Understand these instructions.   Will watch your condition.   Will get help right away if you are not doing well or get worse.  Document Released: 08/24/2000 Document Revised: 08/16/2011 Document Reviewed: 09/24/2007 [facility] Patient Information 2012 [place], [place].

## 2012-04-22 NOTE — Progress Notes (Signed)
Subjective:     Patient ID: Amanda Bennett, female   DOB: Apr 03, 1956, 56 y.o.   MRN: [id number]  HPI The patient is a 56 year old female who comes in today for a two-week followup visit of her blood pressure. She also states that she would like to start IV bisphosphonate at today's visit for her osteoporosis. She also states that she needs a different prescription for her Premarin and she cannot halve the current dose that she has she does need a dose change. She states that she has been taking the amlodipine that was recently prescribed for her blood pressure. She said she's noticed a little bit of a difference in how she feels. She is feeling better. She also states that she has chronic back pain however that's been the same and she is actually doing reasonably well with that. She is not having any problems at today's visit. No complaints. No chest pain, vomiting, nausea, diarrhea, shortness of breath. No feelings of dizziness or syncope. She states that she is still having some acid reflux and would like to increase the dose on her Nexium. She states that occasionally she takes 2 pills in that her helps a lot better and would like to try dose increase. She is trying to quit smoking and is down to one cigarette per day. She states that smoking a cigarette at bedtime helps her to relax before going to sleep. No recent falls at home.  Review of Systems  Constitutional: Negative.   HENT: Negative.   Respiratory: Negative.   Cardiovascular: Negative.   Gastrointestinal: Negative.   Musculoskeletal: Negative.   Neurological: Negative.        Objective:   Physical Exam  Constitutional: She is oriented to person, place, and time. She appears well-developed and well-nourished.  HENT:  Head: Normocephalic and atraumatic.  Eyes: EOM are normal. Pupils are equal, round, and reactive to light.  Neck: Normal range of motion. Neck supple.  Cardiovascular: Normal rate and regular rhythm.   No murmur  heard. Pulmonary/Chest: Effort normal and breath sounds normal. No respiratory distress. She exhibits no tenderness.  Abdominal: Soft. Bowel sounds are normal. She exhibits no distension. There is no tenderness. There is no rebound.  Musculoskeletal: Normal range of motion.  Neurological: She is alert and oriented to person, place, and time.  Skin: Skin is warm and dry.  Psychiatric: She has a normal mood and affect. Her behavior is normal. Judgment and thought content normal.       Assessment/Plan:   1. Hypertension-the patient's blood pressure is 138/83 and is at goal at today's visit. She is still on extremely low dose of amlodipine at 2.5 mg daily. If her blood pressures elevated in the future there is room to go up on that medication. We will see her back in 3 months for a recheck of her blood pressure.  2. Osteoporosis-the patient has tried taking Fosamax at home and however did develop oral sores and was having extreme heartburn with that medication. She would like to try IV medication. I will try IV Boniva for her at today's visit. Appropriate paperwork was filled out and faxed to short stay so that she can be scheduled to begin medication. She continues to take oral calcium/vitamin D at home daily.  3. GERD-the patient is currently taking Nexium 20 mg daily and this is not quite controlling her pain. Will increase her dose of Nexium to 40 mg daily.  4. Disposition-the patient will be started on IV Boniva  every 3 months. She was given prescription for increased dosing of Nexium to help control her GERD better. Her blood pressure is at goal today on very low dose of amlodipine. We will see her back in 3 months for a followup of her blood pressure. Give her our number and advised if she has problems before then or questions she should feel free to call our office. Also gave prescription for Premarin 0.3 mg by mouth daily.

## 2012-05-05 ENCOUNTER — Other Ambulatory Visit (HOSPITAL_COMMUNITY): Payer: Self-pay | Admitting: *Deleted

## 2012-05-06 ENCOUNTER — Encounter (HOSPITAL_COMMUNITY)
Admission: RE | Admit: 2012-05-06 | Discharge: 2012-05-06 | Disposition: A | Discharge status: Home / Self Care | Payer: Medicaid Other | Source: Ambulatory Visit | Attending: Internal Medicine | Admitting: Internal Medicine

## 2012-05-06 DIAGNOSIS — M810 Age-related osteoporosis without current pathological fracture: Secondary | ICD-10-CM | POA: Insufficient documentation

## 2012-05-06 MED ORDER — IBANDRONATE SODIUM 3 MG/3ML IV SOLN
INTRAVENOUS | Status: AC
  Administered 2012-05-06: 3 mg via INTRAVENOUS
  Filled 2012-05-06: qty 3

## 2012-05-06 MED ORDER — IBANDRONATE SODIUM 3 MG/3ML IV SOLN
3.0000 mg | Freq: Once | INTRAVENOUS | Status: AC
  Administered 2012-05-06: 3 mg via INTRAVENOUS

## 2012-05-06 MED ORDER — SODIUM CHLORIDE 0.9 % IV SOLN
INTRAVENOUS | Status: DC
  Administered 2012-05-06: 09:00:00 via INTRAVENOUS

## 2012-05-07 ENCOUNTER — Encounter (HOSPITAL_BASED_OUTPATIENT_CLINIC_OR_DEPARTMENT_OTHER): Payer: Medicaid Other | Admitting: Physical Medicine and Rehabilitation

## 2012-05-07 ENCOUNTER — Encounter: Payer: Self-pay | Admitting: Physical Medicine and Rehabilitation

## 2012-05-07 VITALS — BP 134/92 | HR 71 | Resp 14 | Ht 64.0 in | Wt 115.0 lb

## 2012-05-07 DIAGNOSIS — M47816 Spondylosis without myelopathy or radiculopathy, lumbar region: Secondary | ICD-10-CM

## 2012-05-07 DIAGNOSIS — M542 Cervicalgia: Secondary | ICD-10-CM

## 2012-05-07 DIAGNOSIS — M47817 Spondylosis without myelopathy or radiculopathy, lumbosacral region: Secondary | ICD-10-CM

## 2012-05-07 DIAGNOSIS — M19049 Primary osteoarthritis, unspecified hand: Secondary | ICD-10-CM

## 2012-05-07 DIAGNOSIS — M25551 Pain in right hip: Secondary | ICD-10-CM

## 2012-05-07 DIAGNOSIS — M545 Low back pain, unspecified: Secondary | ICD-10-CM

## 2012-05-07 DIAGNOSIS — M25559 Pain in unspecified hip: Secondary | ICD-10-CM

## 2012-05-07 DIAGNOSIS — G8929 Other chronic pain: Secondary | ICD-10-CM

## 2012-05-07 MED ORDER — TRAMADOL HCL 50 MG PO TABS: 50.0000 mg | ORAL_TABLET | Freq: Three times a day (TID) | ORAL | Status: AC | PRN

## 2012-05-07 NOTE — Patient Instructions (Addendum)
Your Blood pressure was elevated (134/92) please follow up with your primary care doctor for this.  I have restarted you back on tramadol.  I will see you back in 3 months.   Let me know when you are interested in Physical therapy.  I am happy to hear you are water walking at the [facility] 2-3 times a week.

## 2012-05-07 NOTE — Progress Notes (Signed)
Subjective:    Patient ID: Amanda Bennett, female    DOB: 17-Nov-1955, 56 y.o.   MRN: [id number]  HPI   Pain in periscapula bilaterallly, low back, lateral right leg to ankle, bilateral hands.  CMC joint injection 01/16/2012 here for follow up visit after injection. Pain score before injection pain was 8/10 and is now a 6/10.   Recent R Hip injection, greater trochanter, did not help hip. Whole right leg bothers her since 2010. Worse in last 6 months.   The patient is a 56 year old woman who is followed. [facility] for chronic pain complaints that are related to her neck in her low back.  She also has a history of intermittent hand numbness and electroiagnostic studies last spring showed mild carpal tunnel syndrome.    Using night splints for this with beneficial reports    She reports continued pain in her right thumb. 4/10 scale    She recently underwent gallbladder surgery on 10/30/2011 Dr. Karie Schwalbe pain SUI    In the interim she has also been diagnosed with osteoporosis and her PCP may be starting her on some medication for this.    Last fall she saw Dr. Venetia Maxon and cervical MRI and lumbar MRI were ordered. A surgical option was not presented.    Her chief complaint today is right lateral hip pain which radiates into the lateral thigh. This pain has improved since last visit. She tells me the injection helped her pain about 50%.  She also has some low back pain which is unchanged.    She also notes some intermittent numbness and tingling intermittntly in R lower extremity. There has been no weakness noted. She's been using her cane because her right leg/hip has been bothering her.  There is no history of bladder incontinence, no history of bowel incontinence.  Is been using Flector patches which seemed to take the edge of slightly.    Pain Inventory Average Pain 7 Pain Right Now 7 My pain is constant, sharp, burning, dull, stabbing,  tingling and aching  In the last 24 hours, has pain interfered with the following? General activity 10 Relation with others 10 Enjoyment of life 10 What TIME of day is your pain at its worst? morning , evening and night Sleep (in general) Poor  Pain is worse with: walking, bending, sitting, inactivity and standing Pain improves with: rest, heat/ice, therapy/exercise, pacing activities and medication Relief from Meds: 4  Mobility use a cane how many minutes can you walk? 5 ability to climb steps?  no do you drive?  yes  Function disabled: date disabled 2010 I need assistance with the following:  meal prep, household duties and shopping  Neuro/Psych bowel control problems weakness numbness tremor tingling trouble walking spasms dizziness depression anxiety  Prior Studies Any changes since last visit?  no  Physicians involved in your care Any changes since last visit?  no   Family History  Problem Relation Age of Onset  . Stroke Neg Hx   . Stomach cancer Neg Hx   . Cancer Father     colon/prostate  . Colon cancer Father 45    deceased at age 38 from colon cancer  . Cancer Paternal Grandmother     ovarian   History   Social History  . Marital Status: Single    Spouse Name: N/A    Number of Children: N/A  . Years of Education: N/A   Social History Main Topics  .  Smoking status: Current Everyday Smoker -- 0.1 packs/day for 20 years    Types: Cigarettes    Last Attempt to Quit: 01/08/2011  . Smokeless tobacco: Never Used   Comment: Wants to tr Chantix/ 05/07/12 down to about 1-2 cig per day  . Alcohol Use: No  . Drug Use: No  . Sexually Active: No   Other Topics Concern  . None   Social History Narrative   Financial assistance approved for 100% discount at [facility] and has [facility] card, also [place].  per Rudell Cobb 12/09/2009   Past Surgical History  Procedure Date  . Abdominal hysterectomy   . Finger amputated     right ring  . Drain  in left ring finger     cat bite  . Broken clavical   . Cholecystectomy   . Hemorrhoid surgery 11/25/2006  . Cholecystectomy 10/30/2011    Procedure: LAPAROSCOPIC CHOLECYSTECTOMY WITH INTRAOPERATIVE CHOLANGIOGRAM;  Surgeon: Wilmon Arms. Corliss Skains, MD;  Location: [facility];  Service: General;  Laterality: N/A;   Past Medical History  Diagnosis Date  . Foot fracture, right 07/2010    per x-ray 07/2010 - oblique comminuted fifth metatarsal fracture, followed by Dr. Patsy Lager  . Depression   . Insomnia   . GERD (gastroesophageal reflux disease)   . Carpal tunnel syndrome   . Cervical spinal stenosis 2008    per x-ray findings 2008  . Pulmonary nodule, right     RUL nodule, 7 mm noted on Chest XRAY - 06/2008, per CT follow up in 2009 - No suspicious pulmonary nodules or masse noted  . Cervicalgia   . Pain in limb   . Lumbago   . Chronic pain syndrome   . Cervical syndrome   . Primary localized osteoarthrosis, pelvic region and thigh   . Lesion of ulnar nerve   . Anxiety   . Osteoporosis   . Unexplained weight loss   . Visual disturbance   . Cough   . Arthritis pain   . Bruises easily   . Wound disruption, post-op, skin   . PONV (postoperative nausea and vomiting)     shortness of breath   . Hepatitis     hx of Hep C    BP 134/92  Pulse 71  Resp 14  Ht 5\' 4"  (1.626 m)  Wt 115 lb (52.164 kg)  BMI 19.74 kg/m2  SpO2 99%  LMP 10/23/2002    Review of Systems  Constitutional: Positive for appetite change and unexpected weight change.  HENT: Positive for neck pain and neck stiffness.   Respiratory: Positive for cough, shortness of breath and wheezing.   Gastrointestinal: Positive for nausea, diarrhea and constipation.  Musculoskeletal: Positive for back pain and gait problem.       Spasms  Neurological: Positive for dizziness, tremors, weakness and numbness.       Tingling  Psychiatric/Behavioral: Positive for dysphoric mood. The patient is nervous/anxious.   All other systems  reviewed and are negative.       Objective:   Physical Exam  The patient is a well-developed thin adult female who does not appear in any distress. She is oriented x3 her speech is clear her affect is bright and she's alert and cooperative and pleasant. She follows commands without difficulty answers questions appropriately  Cranial nerves and coordination are grossly intact.  Her reflexes are 2+ in the upper extremities.  Achilles tendon 2+ in the left lower extremity right lower is 0.  2+ at the  knee without abnormal tone clonus or tremors.  Strength is generally good in both upper and lower extremities.  ROM is intact in the upper extremities.  She reports some decreased sensation in the dorsum of the right foot.  She transitions slowly from sitting to standing. Her gait is a antalgic. She has limitations in cervical range of motion mildly.  Lumbar motion is without significant pain with forward flexion. Extension exacerbates pain and returning up from a flexed position also exacerbates low back pain.  Bilateral hips are evaluated. She has tenderness over the right greater trochanter region somewhat down the iliotibial band as well  She reports some pain in the groin and posterior hip with internal and external rotation of the right hip.  She has significant first carpal metacarpal joint deformity on the right tenderness is noted over the [facility]: Lambert Rhonda W joint. This improved from previous visit. Some involvement as noted on the left but much milder.  She has a healing periumbilical incision.      "06/07/11 lumbar MRI  Marrow signal homogeneous. Mild  disc desiccation and loss of disc height at L3-4. Normal conus.  No disc protrusion or spinal stenosis. Mild lower lumbar facet  arthropathy. No extraforaminal protrusion or significant foraminal  narrowing. Unremarkable paraspinous soft tissues.  IMPRESSION:  Mild lumbar spondylosis. No compressive lesion."    11/27/10 *RADIOLOGY REPORT*    Clinical Data: Neck and left arm pain. Right arm and leg pain and  weakness.  MRI CERVICAL SPINE WITHOUT CONTRAST  Technique: Multiplanar and multiecho pulse sequences of the  cervical spine, to include the craniocervical junction and  cervicothoracic junction, were obtained according to standard  protocol without intravenous contrast.  Comparison: Radiography 02/14  Findings: There is no abnormality at the foramen magnum, C1-2 or C2-  3. At C3-4, there is minimal bulging of the disc. There is facet  degeneration left worse than right. The central canal is widely  patent. There is moderate foraminal narrowing on the left because  of osteophytic encroachment.  At C4-5, there is bilateral facet degeneration right worse than  left. There is shallow protrusion of disc material. The central  canal sufficiently patent with no compressive effect upon the cord.  There is mild foraminal encroachment by osteophytes, left more than  right.  At C5-6, there is facet degeneration right worse than left. There  is 1 mm of anterolisthesis. There is spondylosis with endplate  osteophytes and bulging of the disc more prominent on the right.  The central canal sufficiently patent. The foramen on the left is  widely patent. There is foraminal narrowing on the right that  could effect the C6 nerve root.  At C6-7, there is facet degeneration worse on the left than the  right. There is 1 mm of anterolisthesis. There is mild bulging of  the disc. The central canal is widely patent. There is mild  foraminal encroachment bilaterally, more on the left.  At C7-T1, there is mild facet degeneration on the left but no  slippage. The disc is normal. The canal and foramina are widely  patent. The upper thoracic region is normal.  No abnormal cord signal.  IMPRESSION:  C T3: Mild facet degeneration but no slippage or stenosis.  C3-4: Facet degeneration worse on the left. Foraminal  encroachment on the left by  osteophytes that could effect the C4  nerve root.  C4-5: Bilateral facet degeneration. Spondylosis with bulging of  the disc. Mild foraminal narrowing bilaterally.  C5-6: Facet degeneration right more  than left. 1 mm of  anterolisthesis. Spondylosis worse on the right. Mild to moderate  foraminal encroachment on the right by osteophytes.  C6-7: Facet degeneration more pronounced on the left. 1 mm  anterolisthesis. Some foraminal narrowing on the left.  Original Report Authenticated By: Thomasenia Sales, M.D.    *RADIOLOGY REPORT*  Clinical Data: Right hip pain.  BILATERAL HIP WITH PELVIS - 4+ VIEW  Comparison: Pelvis one-view 06/06/2010.  Findings: Hip joint space is maintained bilaterally. No  subchondral sclerosis or cyst formation. No osteophytosis.  Obturator rings are intact. Old right iliac wing fracture is  better seen on prior imaging number one  IMPRESSION:  No findings to explain the patient's right hip pain.  Original Report Authenticated By: Reyes Ivan, M.D.      Assessment & Plan:  1. Neck pain with known cervical spine spondylosis    2. Bilateral hand pain: Would like to have patient see occupational therapy for education on joint protection techniques.    3. Right thumb CMC joint OA    4. Mild bilateral carpal tunnel syndrome continues to use night splints for this problem, she reports this seems to help    5. Low back pain exacerbated, I've encouraged her to followup with Dr. Venetia Maxon. She has a history of previous pelvic fracture. Would like to obtain hip radiographs to rule out any new problems.    Our goals are to minimize narcotic pain medication and encourage function.  She has not made a followup appointment with Dr. Venetia Maxon.    6. Greater trochanteric bursitis right hip: Injection last visit helped leg pain about 50%. She still having some greater trochanter or pain which is located just slightly posterior to the greater trochanter at this time.     Right gr troch inj not as helpful this last time. ?possible neuropathic component to pain vs gluteal muscle tendon(myofascial) pain component.    Have also encouraged physical therapy to specifically address muscle imbalances around right hip.  As well as tightness of the IT band. She does not wish to pursue therapy at this time.   Over the next few months we'll reassess and we also discussed consideration of sacroiliac joint injection on the right as well. She wants to hold off on this for now.  She finds the gabapentin has been helpful. She is currently taking 600 mg twice a day. I've asked her why she doesn't take an extra dose but she tells me it is deceased today being and she wants to get active again.    She's in contact with primary care for management of her osteoporosis.   She is interested in getting started in a exercise routine in the upcoming months.   I recommend to start water walking avoiding positions that put her neck her low back into extension. She verbalizes understanding      She  decided she wants to continue tramadol again. She has used it with cymbalta before and done very well. willl start back on tramadol 50mg  tid. #90   She is continuing to do water walking at the [facility] several times a week.  She understands the risks and benefits of narcotics. She will not be using machinery or driving while on this medication will not use alcohol  She will keep them locked up in a secure location.   Followup with primary care for her elevated BP   F/u PM&R in 2 months

## 2012-05-23 ENCOUNTER — Telehealth: Payer: Self-pay | Admitting: *Deleted

## 2012-05-23 NOTE — Telephone Encounter (Signed)
Received request from pt's pharmacy for nexium 40 mg daily.  This medication is nonpreferred.  Preferred meds include omeprazole, lansoprazole, prilosec, or pantoprazole.  She has used omperazole in 2010, but it was changed due to her insurance at that time did not cover it.  Please place new rx for another PPI and send to pharmacy.Amanda Spittle Cassady9/13/201312:44 PM

## 2012-06-05 ENCOUNTER — Encounter: Payer: Medicaid Other | Admitting: Physical Medicine and Rehabilitation

## 2012-06-06 ENCOUNTER — Other Ambulatory Visit: Payer: Self-pay | Admitting: Internal Medicine

## 2012-06-06 MED ORDER — OMEPRAZOLE 40 MG PO CPDR: 40.0000 mg | DELAYED_RELEASE_CAPSULE | Freq: Every day | ORAL | Status: DC

## 2012-06-06 NOTE — Telephone Encounter (Signed)
Rx of Omeprazole sent to pharmacy

## 2012-06-19 ENCOUNTER — Other Ambulatory Visit: Payer: Self-pay | Admitting: Internal Medicine

## 2012-06-19 ENCOUNTER — Other Ambulatory Visit: Payer: Self-pay | Admitting: Physical Medicine and Rehabilitation

## 2012-06-19 DIAGNOSIS — Z1231 Encounter for screening mammogram for malignant neoplasm of breast: Secondary | ICD-10-CM

## 2012-06-25 ENCOUNTER — Encounter: Payer: Medicaid Other | Attending: Neurosurgery | Admitting: Physical Medicine and Rehabilitation

## 2012-06-25 ENCOUNTER — Encounter: Payer: Self-pay | Admitting: Physical Medicine and Rehabilitation

## 2012-06-25 VITALS — BP 123/78 | HR 80 | Resp 14 | Ht 64.0 in | Wt 116.0 lb

## 2012-06-25 DIAGNOSIS — M7071 Other bursitis of hip, right hip: Secondary | ICD-10-CM

## 2012-06-25 DIAGNOSIS — M25559 Pain in unspecified hip: Secondary | ICD-10-CM

## 2012-06-25 DIAGNOSIS — M25551 Pain in right hip: Secondary | ICD-10-CM

## 2012-06-25 DIAGNOSIS — M76899 Other specified enthesopathies of unspecified lower limb, excluding foot: Secondary | ICD-10-CM

## 2012-06-25 DIAGNOSIS — M7918 Myalgia, other site: Secondary | ICD-10-CM

## 2012-06-25 DIAGNOSIS — IMO0001 Reserved for inherently not codable concepts without codable children: Secondary | ICD-10-CM | POA: Insufficient documentation

## 2012-06-25 MED ORDER — GABAPENTIN 600 MG PO TABS: 600.0000 mg | ORAL_TABLET | Freq: Four times a day (QID) | ORAL | Status: DC

## 2012-06-25 NOTE — Patient Instructions (Addendum)
I have referred you to physical therapy to address your right hip pain.  I have increased your gabapentin dose.  Followup in 2 months

## 2012-06-25 NOTE — Progress Notes (Signed)
Subjective:    Patient ID: Amanda Bennett, female    DOB: 07-08-1956, 56 y.o.   MRN: [id number]  HPI The patient is a 56 year old woman who is followed. [facility] for chronic pain complaints that are related to her neck in her low back.  She also has a history of intermittent hand numbness and electroiagnostic studies last spring showed mild carpal tunnel syndrome.  Using night splints for this with beneficial reports  She reports continued pain in her right thumb. 4/10 scale  She recently underwent gallbladder surgery on 10/30/2011 Dr. Karie Schwalbe pain SUI  In the interim she has also been diagnosed with osteoporosis and her PCP may be starting her on some medication for this.  Last fall she saw Dr. Venetia Maxon and cervical MRI and lumbar MRI were ordered. A surgical option was not presented.  Her chief complaint today is right lateral hip pain which radiates into the lateral thigh. This pain has improved since last visit. She tells me the injection helped her pain about 50%.  She also has some low back pain which is unchanged.  She also notes some intermittent numbness and tingling intermittntly in R lower extremity. There has been no weakness noted. She's been using her cane because her right leg/hip has been bothering her.  There is no history of bladder incontinence, no history of bowel incontinence.  Is been using Flector patches which seemed to take the edge of slightly.    Pain in periscapula bilaterallly, low back, lateral right leg to ankle, bilateral hands.    CMC joint injection 01/16/2012 here for follow up visit after injection. Pain score before injection pain was 8/10 and is now a 6/10.    Recent R Hip injection, greater trochanter, did not help hip. Whole right leg bothers her since 2010.   Worse in last 6 months.   Pain Inventory Average Pain 7 Pain Right Now 7 My pain is constant, sharp, burning, dull, stabbing, tingling and aching  In  the last 24 hours, has pain interfered with the following? General activity 10 Relation with others 10 Enjoyment of life 10 What TIME of day is your pain at its worst? all the time Sleep (in general) Poor  Pain is worse with: walking, bending, sitting, inactivity and standing Pain improves with: rest, heat/ice, pacing activities and medication Relief from Meds: 0  Mobility use a cane how many minutes can you walk? 5 ability to climb steps?  no do you drive?  yes Do you have any goals in this area?  yes  Function disabled: date disabled 2010 I need assistance with the following:  meal prep, household duties and shopping Do you have any goals in this area?  yes  Neuro/Psych numbness tremor tingling trouble walking spasms dizziness depression anxiety  Prior Studies Any changes since last visit?  no  Physicians involved in your care Any changes since last visit?  no   Family History  Problem Relation Age of Onset  . Stroke Neg Hx   . Stomach cancer Neg Hx   . Cancer Father     colon/prostate  . Colon cancer Father 74    deceased at age 46 from colon cancer  . Cancer Paternal Grandmother     ovarian   History   Social History  . Marital Status: Single    Spouse Name: N/A    Number of Children: N/A  . Years of Education: N/A   Social History Main Topics  .  Smoking status: Current Every Day Smoker -- 0.1 packs/day for 20 years    Types: Cigarettes    Last Attempt to Quit: 01/08/2011  . Smokeless tobacco: Never Used   Comment: Wants to tr Chantix/ 05/07/12 down to about 1-2 cig per day  . Alcohol Use: No  . Drug Use: No  . Sexually Active: No   Other Topics Concern  . None   Social History Narrative   Financial assistance approved for 100% discount at [facility] and has [facility] card, also [place].  per Rudell Cobb 12/09/2009   Past Surgical History  Procedure Date  . Abdominal hysterectomy   . Finger amputated     right ring  . Drain in left  ring finger     cat bite  . Broken clavical   . Cholecystectomy   . Hemorrhoid surgery 11/25/2006  . Cholecystectomy 10/30/2011    Procedure: LAPAROSCOPIC CHOLECYSTECTOMY WITH INTRAOPERATIVE CHOLANGIOGRAM;  Surgeon: Wilmon Arms. Corliss Skains, MD;  Location: [facility];  Service: General;  Laterality: N/A;   Past Medical History  Diagnosis Date  . Foot fracture, right 07/2010    per x-ray 07/2010 - oblique comminuted fifth metatarsal fracture, followed by Dr. Patsy Lager  . Depression   . Insomnia   . GERD (gastroesophageal reflux disease)   . Carpal tunnel syndrome   . Cervical spinal stenosis 2008    per x-ray findings 2008  . Pulmonary nodule, right     RUL nodule, 7 mm noted on Chest XRAY - 06/2008, per CT follow up in 2009 - No suspicious pulmonary nodules or masse noted  . Cervicalgia   . Pain in limb   . Lumbago   . Chronic pain syndrome   . Cervical syndrome   . Primary localized osteoarthrosis, pelvic region and thigh   . Lesion of ulnar nerve   . Anxiety   . Osteoporosis   . Unexplained weight loss   . Visual disturbance   . Cough   . Arthritis pain   . Bruises easily   . Wound disruption, post-op, skin   . PONV (postoperative nausea and vomiting)     shortness of breath   . Hepatitis     hx of Hep C    BP 123/78  Pulse 80  Resp 14  Ht 5\' 4"  (1.626 m)  Wt 116 lb (52.617 kg)  BMI 19.91 kg/m2  SpO2 96%  LMP 10/23/2002     Review of Systems  Constitutional: Positive for diaphoresis, appetite change and unexpected weight change.  HENT: Positive for neck pain.   Respiratory: Positive for shortness of breath and wheezing.   Gastrointestinal: Positive for nausea, vomiting, abdominal pain, diarrhea and constipation.  Musculoskeletal: Positive for myalgias, back pain, joint swelling, arthralgias and gait problem.  Neurological: Positive for dizziness and numbness.  Hematological: Bruises/bleeds easily.  Psychiatric/Behavioral: Positive for dysphoric mood. The patient is  nervous/anxious.   All other systems reviewed and are negative.       Objective:   Physical Exam  The patient is a well-developed thin adult female who does not appear in any distress. She is oriented x3 her speech is clear her affect is bright and she's alert and cooperative and pleasant. She follows commands without difficulty answers questions appropriately  Cranial nerves and coordination are grossly intact.  Her reflexes are 2+ in the upper extremities.  Achilles tendon 2+ in the left lower extremity right lower is 0.  2+ at the knee without abnormal tone clonus or  tremors.    Strength is generally good in both upper and lower extremities.    ROM is intact in the upper extremities.    She reports some decreased sensation in the dorsum of the right foot.    She transitions slowly from sitting to standing. Her gait is a slow and stable. She has limitations in cervical range of motion mildly.  Lumbar motion is without significant pain with forward flexion. Extension exacerbates pain and returning up from a flexed position also exacerbates low back pain.    Bilateral hips are evaluated. She has tenderness over the right greater trochanter region somewhat down the iliotibial band as well    She reports some pain in the groin and posterior hip with internal and external rotation of the right hip.    She has significant first carpal metacarpal joint deformity on the right tenderness is noted over the [facility] joint. This improved from previous visit. Some involvement as noted on the left but much milder.         "06/07/11 lumbar MRI  Marrow signal homogeneous. Mild  disc desiccation and loss of disc height at L3-4. Normal conus.  No disc protrusion or spinal stenosis. Mild lower lumbar facet  arthropathy. No extraforaminal protrusion or significant foraminal  narrowing. Unremarkable paraspinous soft tissues.  IMPRESSION:  Mild lumbar spondylosis. No compressive lesion."    11/27/10  *RADIOLOGY REPORT*  Clinical Data: Neck and left arm pain. Right arm and leg pain and  weakness.  MRI CERVICAL SPINE WITHOUT CONTRAST  Technique: Multiplanar and multiecho pulse sequences of the  cervical spine, to include the craniocervical junction and  cervicothoracic junction, were obtained according to standard  protocol without intravenous contrast.  Comparison: Radiography 02/14  Findings: There is no abnormality at the foramen magnum, C1-2 or C2-  3. At C3-4, there is minimal bulging of the disc. There is facet  degeneration left worse than right. The central canal is widely  patent. There is moderate foraminal narrowing on the left because  of osteophytic encroachment.  At C4-5, there is bilateral facet degeneration right worse than  left. There is shallow protrusion of disc material. The central  canal sufficiently patent with no compressive effect upon the cord.  There is mild foraminal encroachment by osteophytes, left more than  right.  At C5-6, there is facet degeneration right worse than left. There  is 1 mm of anterolisthesis. There is spondylosis with endplate  osteophytes and bulging of the disc more prominent on the right.  The central canal sufficiently patent. The foramen on the left is  widely patent. There is foraminal narrowing on the right that  could effect the C6 nerve root.  At C6-7, there is facet degeneration worse on the left than the  right. There is 1 mm of anterolisthesis. There is mild bulging of  the disc. The central canal is widely patent. There is mild  foraminal encroachment bilaterally, more on the left.  At C7-T1, there is mild facet degeneration on the left but no  slippage. The disc is normal. The canal and foramina are widely  patent. The upper thoracic region is normal.  No abnormal cord signal.  IMPRESSION:  C T3: Mild facet degeneration but no slippage or stenosis.  C3-4: Facet degeneration worse on the left. Foraminal   encroachment on the left by osteophytes that could effect the C4  nerve root.  C4-5: Bilateral facet degeneration. Spondylosis with bulging of  the disc. Mild foraminal narrowing bilaterally.  C5-6: Facet degeneration right more than left. 1 mm of  anterolisthesis. Spondylosis worse on the right. Mild to moderate  foraminal encroachment on the right by osteophytes.  C6-7: Facet degeneration more pronounced on the left. 1 mm  anterolisthesis. Some foraminal narrowing on the left.  Original Report Authenticated By: Thomasenia Sales, M.D.  *RADIOLOGY REPORT*  Clinical Data: Right hip pain.  BILATERAL HIP WITH PELVIS - 4+ VIEW  Comparison: Pelvis one-view 06/06/2010.  Findings: Hip joint space is maintained bilaterally. No  subchondral sclerosis or cyst formation. No osteophytosis.  Obturator rings are intact. Old right iliac wing fracture is  better seen on prior imaging number one  IMPRESSION:  No findings to explain the patient's right hip pain.  Original Report Authenticated By: Reyes Ivan, M.D.        Assessment & Plan:  1. Neck pain with known cervical spine spondylosis   2. Bilateral hand pain: Would like to have patient see occupational therapy for education on joint protection techniques will hold off for now while she participates in PT for hips.  3. Right thumb CMC joint OA   4. Mild bilateral carpal tunnel syndrome continues to use night splints for this problem, she reports this seems to help   5. Low back pain stable.    Our goals are to minimize narcotic pain medication and encourage function.  She has not made a followup appointment with Dr. Venetia Maxon.    6. Greater trochanteric bursitis right hip: Injection last visit helped leg pain about 50%. She still having some greater trochanter or pain which is located just slightly posterior to the greater trochanter at this time.  Right gr troch inj not as helpful this last time.  ?possible neuropathic component to  pain vs gluteal muscle tendon(myofascial) pain component.  Have also encouraged physical therapy to specifically address muscle imbalances around right hip.  As well as tightness of the IT band. She does not wish to pursue therapy at this time.    Over the next few months we'll reassess and we also discussed consideration of sacroiliac joint injection on the right as well. She wants to hold off on this for now.    She finds the gabapentin has been helpful.  She's in contact with primary care for management of her osteoporosis.   She is interested in getting started in a exercise routine in the upcoming months.   I have ordered physical therapy to address right hip myofascial dysfunction/bursitis.  I have increased gabapentin 600 mg 4 times a day.  Will see her back in 2 months  She decided she wants to continue tramadol again. She has used it with cymbalta before and done very well. willl start back on tramadol 50mg  tid. #90  She is continuing to do water walking at the [facility] several times a week.  She understands the risks and benefits of narcotics. She will not be using machinery or driving while on this medication will not use alcohol  She will keep them locked up in a secure location.  Followup with primary care for her elevated BP  F/u PM&R in 2 months

## 2012-07-03 ENCOUNTER — Ambulatory Visit: Payer: Medicaid Other | Attending: Physical Medicine and Rehabilitation | Admitting: Rehabilitation

## 2012-07-08 ENCOUNTER — Other Ambulatory Visit: Payer: Self-pay | Admitting: *Deleted

## 2012-07-08 NOTE — Telephone Encounter (Signed)
Pt called asking for a refill on Celexa 20 mg.  It's been awhile since she has been on it but it works better for her. EMR shows change in therapy on 10/02/11.

## 2012-07-08 NOTE — Telephone Encounter (Signed)
Unable to reach pt.  Message left to call triage nurse.

## 2012-07-08 NOTE — Telephone Encounter (Signed)
Pt requested to change from Celexa to Cymbalta in Jan. She will need to discuss this with her PCP.

## 2012-07-09 ENCOUNTER — Ambulatory Visit (HOSPITAL_COMMUNITY): Payer: Medicaid Other

## 2012-07-09 NOTE — Telephone Encounter (Signed)
Talked with pt - back on Celexa now and feeling better with higher dose of Neurontin. Appt made to see Dr Anselm Jungling 07/29/12 2:15PM. Pt states she has enough med till appt. Delina Farrah Skoda RN 07/09/12 9AM

## 2012-07-16 ENCOUNTER — Other Ambulatory Visit: Payer: Self-pay | Admitting: *Deleted

## 2012-07-16 DIAGNOSIS — N951 Menopausal and female climacteric states: Secondary | ICD-10-CM

## 2012-07-16 MED ORDER — ESTROGENS CONJUGATED 0.3 MG PO TABS: 0.3000 mg | ORAL_TABLET | Freq: Every day | ORAL | Status: DC

## 2012-07-16 NOTE — Telephone Encounter (Signed)
Last discussed July and taper started. Will refill

## 2012-07-29 ENCOUNTER — Encounter: Payer: Medicaid Other | Admitting: Internal Medicine

## 2012-07-31 ENCOUNTER — Ambulatory Visit (HOSPITAL_COMMUNITY)
Admission: RE | Admit: 2012-07-31 | Discharge: 2012-07-31 | Disposition: A | Discharge status: Home / Self Care | Payer: Medicaid Other | Source: Ambulatory Visit | Attending: Physical Medicine and Rehabilitation | Admitting: Physical Medicine and Rehabilitation

## 2012-07-31 DIAGNOSIS — Z1231 Encounter for screening mammogram for malignant neoplasm of breast: Secondary | ICD-10-CM | POA: Insufficient documentation

## 2012-08-21 ENCOUNTER — Encounter: Payer: Self-pay | Admitting: Radiation Oncology

## 2012-08-21 ENCOUNTER — Ambulatory Visit (HOSPITAL_COMMUNITY)
Admission: RE | Admit: 2012-08-21 | Discharge: 2012-08-21 | Disposition: A | Discharge status: Home / Self Care | Payer: Medicaid Other | Source: Ambulatory Visit | Attending: Internal Medicine | Admitting: Internal Medicine

## 2012-08-21 ENCOUNTER — Ambulatory Visit (INDEPENDENT_AMBULATORY_CARE_PROVIDER_SITE_OTHER): Payer: Medicaid Other | Admitting: Radiation Oncology

## 2012-08-21 VITALS — BP 135/85 | HR 76 | Temp 97.9°F | Ht 64.0 in | Wt 120.3 lb

## 2012-08-21 DIAGNOSIS — B1920 Unspecified viral hepatitis C without hepatic coma: Secondary | ICD-10-CM

## 2012-08-21 DIAGNOSIS — G4700 Insomnia, unspecified: Secondary | ICD-10-CM

## 2012-08-21 DIAGNOSIS — R05 Cough: Secondary | ICD-10-CM | POA: Insufficient documentation

## 2012-08-21 DIAGNOSIS — R059 Cough, unspecified: Secondary | ICD-10-CM | POA: Insufficient documentation

## 2012-08-21 DIAGNOSIS — R0602 Shortness of breath: Secondary | ICD-10-CM | POA: Insufficient documentation

## 2012-08-21 DIAGNOSIS — J45909 Unspecified asthma, uncomplicated: Secondary | ICD-10-CM

## 2012-08-21 LAB — CBC WITH DIFFERENTIAL/PLATELET
Basophils Absolute: 0 10*3/uL (ref 0.0–0.1)
Basophils Relative: 0 % (ref 0–1)
Eosinophils Absolute: 0.1 10*3/uL (ref 0.0–0.7)
Eosinophils Relative: 1 % (ref 0–5)
HCT: 41.4 % (ref 36.0–46.0)
Hemoglobin: 15 g/dL (ref 12.0–15.0)
Lymphocytes Relative: 20 % (ref 12–46)
Lymphs Abs: 1.3 10*3/uL (ref 0.7–4.0)
MCH: 33.5 pg (ref 26.0–34.0)
MCHC: 36.2 g/dL — ABNORMAL HIGH (ref 30.0–36.0)
MCV: 92.4 fL (ref 78.0–100.0)
Monocytes Absolute: 0.8 10*3/uL (ref 0.1–1.0)
Monocytes Relative: 12 % (ref 3–12)
Neutro Abs: 4.4 10*3/uL (ref 1.7–7.7)
Neutrophils Relative %: 67 % (ref 43–77)
Platelets: 241 10*3/uL (ref 150–400)
RBC: 4.48 MIL/uL (ref 3.87–5.11)
RDW: 14.2 % (ref 11.5–15.5)
WBC: 6.6 10*3/uL (ref 4.0–10.5)

## 2012-08-21 LAB — BASIC METABOLIC PANEL
BUN: 11 mg/dL (ref 6–23)
CO2: 28 mEq/L (ref 19–32)
Calcium: 9.7 mg/dL (ref 8.4–10.5)
Chloride: 103 mEq/L (ref 96–112)
Creat: 0.59 mg/dL (ref 0.50–1.10)
Glucose, Bld: 80 mg/dL (ref 70–99)
Potassium: 4 mEq/L (ref 3.5–5.3)
Sodium: 137 mEq/L (ref 135–145)

## 2012-08-21 MED ORDER — TRAZODONE 25 MG HALF TABLET: ORAL_TABLET | ORAL | Status: DC

## 2012-08-21 MED ORDER — DOXYCYCLINE HYCLATE 100 MG PO TABS: 100.0000 mg | ORAL_TABLET | Freq: Two times a day (BID) | ORAL | Status: AC

## 2012-08-21 MED ORDER — PREDNISONE 20 MG PO TABS: 40.0000 mg | ORAL_TABLET | Freq: Every day | ORAL | Status: DC

## 2012-08-21 MED ORDER — ALBUTEROL SULFATE HFA 108 (90 BASE) MCG/ACT IN AERS: 1.0000 | INHALATION_SPRAY | RESPIRATORY_TRACT | Status: DC | PRN

## 2012-08-21 NOTE — Assessment & Plan Note (Signed)
The patient likely has some degree of COPD, given her current presentation, history of asthma-like symptoms, and her 40-pack-year smoking history. As the patient's signs and symptoms today are consistent with a COPD exacerbation with 3/3 cardinal symptoms, the patient will be prescribed corticosteroids in addition to antibiotics. Chest x-ray was performed, and was unremarkable. -Doxycycline x5 days -Prednisone x5 days -Schedule PFTs after illness is resolved

## 2012-08-21 NOTE — Patient Instructions (Signed)
Instructions: -Please take your prednisone for 5 days, as prescribed -Please also take your antibiotic, doxycycline, for 5 days as prescribed as well -Please return to the clinic or seek care at the emergency room if your symptoms worsen or fail to improve -You will be contacted regarding an appointment for your IV Boniva to be given -You'll be contacted regarding scheduling of pulmonary function tests -You will be contacted regarding scheduling of a screening chest CT, given your smoking history and previous reports of a lung nodule -You will be contacted regarding the results of your chest x-ray and lab work performed today   Asthma, Adult Asthma is a disease of the lungs and can make it hard to breathe. Asthma cannot be cured, but medicine can help control it. Asthma may be started (triggered) by:  Pollen.  Dust.  Animal skin flakes (dander).  Molds.  Foods.  Respiratory infections (colds, flu).  Smoke.  Exercise.  Stress.  Other things that cause allergic reactions or allergies (allergens). HOME CARE   Talk to your doctor about how to manage your attacks at home. This may include:  Using a tool called a peak flow meter.  Having medicine ready to stop the attack.  Take all medicine as told by your doctor.  Wash bed sheets and blankets every week in hot water and put them in the dryer.  Drink enough fluids to keep your pee (urine) clear or pale yellow.  Always be ready to get emergency help. Write down the phone number for your doctor. Keep it where you can easily find it.  Talk about exercise routines with your doctor.  If animal dander is causing your asthma, you may need to find a new home for your pet(s). GET HELP RIGHT AWAY IF:   You have muscle aches.  You cough more.  You have chest pain.  You have thick spit (sputum) that changes to yellow, green, gray, or bloody.  Medicine does not stop your wheezing.  You have problems breathing.  You have  a fever.  Your medicine causes:  A rash.  Itching.  Puffiness (swelling).  Breathing problems. MAKE SURE YOU:   Understand these instructions.  Will watch your condition.  Will get help right away if you are not doing well or get worse. Document Released: 02/13/2008 Document Revised: 11/19/2011 Document Reviewed: 07/07/2008 [facility] Patient Information 2013 [place], [place].  Prednisone tablets What is this medicine? PREDNISONE (PRED ni sone) is a corticosteroid. It is commonly used to treat inflammation of the skin, joints, lungs, and other organs. Common conditions treated include asthma, allergies, and arthritis. It is also used for other conditions, such as blood disorders and diseases of the adrenal glands. This medicine may be used for other purposes; ask your health care provider or pharmacist if you have questions. What should I tell my health care provider before I take this medicine? They need to know if you have any of these conditions: -Cushing's syndrome -diabetes -glaucoma -heart disease -high blood pressure -infection (especially a virus infection such as chickenpox, cold sores, or herpes) -kidney disease -liver disease -mental illness -myasthenia gravis -osteoporosis -seizures -stomach or intestine problems -thyroid disease -an unusual or allergic reaction to lactose, prednisone, other medicines, foods, dyes, or preservatives -pregnant or trying to get pregnant -breast-feeding How should I use this medicine? Take this medicine by mouth with a glass of water. Follow the directions on the prescription label. Take this medicine with food. If you are taking this medicine once a day,  take it in the morning. Do not take more medicine than you are told to take. Do not suddenly stop taking your medicine because you may develop a severe reaction. Your doctor will tell you how much medicine to take. If your doctor wants you to stop the medicine, the dose may be  slowly lowered over time to avoid any side effects. Talk to your pediatrician regarding the use of this medicine in children. Special care may be needed. Overdosage: If you think you have taken too much of this medicine contact a poison control center or emergency room at once. NOTE: This medicine is only for you. Do not share this medicine with others. What if I miss a dose? If you miss a dose, take it as soon as you can. If it is almost time for your next dose, talk to your doctor or health care professional. You may need to miss a dose or take an extra dose. Do not take double or extra doses without advice. What may interact with this medicine? Do not take this medicine with any of the following medications: -metyrapone -mifepristone This medicine may also interact with the following medications: -aminoglutethimide -amphotericin B -aspirin and aspirin-like medicines -barbiturates -certain medicines for diabetes, like glipizide or glyburide -cholestyramine -cholinesterase inhibitors -cyclosporine -digoxin -diuretics -ephedrine -female hormones, like estrogens and birth control pills -isoniazid -ketoconazole -NSAIDS, medicines for pain and inflammation, like ibuprofen or naproxen -phenytoin -rifampin -toxoids -vaccines -warfarin This list may not describe all possible interactions. Give your health care provider a list of all the medicines, herbs, non-prescription drugs, or dietary supplements you use. Also tell them if you smoke, drink alcohol, or use illegal drugs. Some items may interact with your medicine. What should I watch for while using this medicine? Visit your doctor or health care professional for regular checks on your progress. If you are taking this medicine over a prolonged period, carry an identification card with your name and address, the type and dose of your medicine, and your doctor's name and address. This medicine may increase your risk of getting an  infection. Tell your doctor or health care professional if you are around anyone with measles or chickenpox, or if you develop sores or blisters that do not heal properly. If you are going to have surgery, tell your doctor or health care professional that you have taken this medicine within the last twelve months. Ask your doctor or health care professional about your diet. You may need to lower the amount of salt you eat. This medicine may affect blood sugar levels. If you have diabetes, check with your doctor or health care professional before you change your diet or the dose of your diabetic medicine. What side effects may I notice from receiving this medicine? Side effects that you should report to your doctor or health care professional as soon as possible: -allergic reactions like skin rash, itching or hives, swelling of the face, lips, or tongue -changes in emotions or moods -changes in vision -depressed mood -eye pain -fever or chills, cough, sore throat, pain or difficulty passing urine -increased thirst -swelling of ankles, feet Side effects that usually do not require medical attention (report to your doctor or health care professional if they continue or are bothersome): -confusion, excitement, restlessness -headache -nausea, vomiting -skin problems, acne, thin and shiny skin -trouble sleeping -weight gain This list may not describe all possible side effects. Call your doctor for medical advice about side effects. You may report side effects to FDA  at 1-800-FDA-1088. Where should I keep my medicine? Keep out of the reach of children. Store at room temperature between 15 and 30 degrees C (59 and 86 degrees F). Protect from light. Keep container tightly closed. Throw away any unused medicine after the expiration date. NOTE: This sheet is a summary. It may not cover all possible information. If you have questions about this medicine, talk to your doctor, pharmacist, or health care  provider.  2012, [employer]. (04/12/2011 10:57:14 AM)  Doxycycline delayed-release tablets What is this medicine? DOXYCYCLINE (dox i SYE kleen) is a tetracycline antibiotic. It kills certain bacteria or stops their growth. It is used to treat many kinds of infections, like dental, skin, respiratory, and urinary tract infections. It also treats acne, Lyme disease, malaria, and certain sexually transmitted infections. This medicine may be used for other purposes; ask your health care provider or pharmacist if you have questions. What should I tell my health care provider before I take this medicine? They need to know if you have any of these conditions: -bowel disease like colitis -liver disease -long exposure to sunlight like working outdoors -an unusual or allergic reaction to doxycycline, tetracycline antibiotics, other medicines, foods, dyes, or preservatives -pregnant or trying to get pregnant -breast-feeding How should I use this medicine? Take this medicine by mouth with a full glass of water. Follow the directions on the prescription label. Do not crush or chew. It is best to take this medicine without food, but if it upsets your stomach take it with food. Take your medicine at regular intervals. Do not take your medicine more often than directed. Take all of your medicine as directed even if you think your are better. Do not skip doses or stop your medicine early. Talk to your pediatrician regarding the use of this medicine in children. Special care may be needed. While this drug may be prescribed for children as young as 71 years old for selected conditions, precautions do apply. Overdosage: If you think you have taken too much of this medicine contact a poison control center or emergency room at once. NOTE: This medicine is only for you. Do not share this medicine with others. What if I miss a dose? If you miss a dose, take it as soon as you can. If it is almost time for your  next dose, take only that dose. Do not take double or extra doses. What may interact with this medicine? -antacids -barbiturates -birth control pills -bismuth subsalicylate -carbamazepine -methoxyflurane -other antibiotics -phenytoin -vitamins that contain iron -warfarin This list may not describe all possible interactions. Give your health care provider a list of all the medicines, herbs, non-prescription drugs, or dietary supplements you use. Also tell them if you smoke, drink alcohol, or use illegal drugs. Some items may interact with your medicine. What should I watch for while using this medicine? Tell your doctor or health care professional if your symptoms do not improve. Do not treat diarrhea with over the counter products. Contact your doctor if you have diarrhea that lasts more than 2 days or if it is severe and watery. Do not take this medicine just before going to bed. It may not dissolve properly when you lay down and can cause pain in your throat. Drink plenty of fluids while taking this medicine to also help reduce irritation in your throat. This medicine can make you more sensitive to the sun. Keep out of the sun. If you cannot avoid being in the sun, wear protective  clothing and use sunscreen. Do not use sun lamps or tanning beds/booths. Birth control pills may not work properly while you are taking this medicine. Talk to your doctor about using an extra method of birth control. If you are being treated for a sexually transmitted infection, avoid sexual contact until you have finished your treatment. Your sexual partner may also need treatment. Avoid antacids, aluminum, calcium, magnesium, and iron products for 4 hours before and 2 hours after taking a dose of this medicine. If you are using this medicine to prevent malaria, you should still protect yourself from contact with mosquitos. Stay in screened-in areas, use mosquito nets, keep your body covered, and use an insect  repellent. What side effects may I notice from receiving this medicine? Side effects that you should report to your doctor or health care professional as soon as possible: -allergic reactions like skin rash, itching or hives, swelling of the face, lips, or tongue -difficulty breathing -fever -itching in the rectal or genital area -pain on swallowing -redness, blistering, peeling or loosening of the skin, including inside the mouth -severe stomach pain or cramps -unusual bleeding or bruising -unusually weak or tired -yellowing of the eyes or skin Side effects that usually do not require medical attention (report to your doctor or health care professional if they continue or are bothersome): -diarrhea -loss of appetite -nausea, vomiting This list may not describe all possible side effects. Call your doctor for medical advice about side effects. You may report side effects to FDA at 1-800-FDA-1088. Where should I keep my medicine? Keep out of the reach of children. Store at room temperature between 15 and 30 degrees C (59 and 86 degrees F). Protect from light. Keep container tightly closed. Throw away any unused medicine after the expiration date. Taking this medicine after the expiration date can make you seriously ill. NOTE: This sheet is a summary. It may not cover all possible information. If you have questions about this medicine, talk to your doctor, pharmacist, or health care provider.  2012, [employer] Standard. (12/25/2007 2:48:25 PM)

## 2012-08-21 NOTE — Progress Notes (Signed)
  Subjective:    Patient ID: ZYLA DASCENZO, female    DOB: 10-Jan-1956, 56 y.o.   MRN: [id number]  HPI Patient is a 56 year old woman with past medical history of asthma, 40-pack-year smoking history, who presents with increased shortness of breath and cough with yellow sputum production x2 weeks. Patient states that approximately 2 weeks prior, she began having symptoms consistent with a viral URI, including a scratchy throat and itchy ears. The patient states that soon after she started developing cough, which has worsened over the last 2 weeks, as has her shortness of breath. The patient states that she's had to use her albuterol inhaler more frequently the usual. She denies any hemoptysis. Denies fever or chills. Patient states that she has never had pulmonary function tests performed previously. She admits to an approximately 40-pack-year smoking history, and states that she only quit smoking approximately one year prior. She denies any sick contacts.  Review of Systems  Constitutional: Negative for fever and chills.  HENT: Negative.   Eyes: Negative.   Respiratory: Positive for cough, shortness of breath and wheezing.   Cardiovascular: Negative for chest pain, palpitations and leg swelling.  Gastrointestinal: Negative for nausea, vomiting, abdominal pain, diarrhea and blood in stool.  Genitourinary: Negative.   Skin: Negative.   Neurological: Negative.   Hematological: Negative.   Psychiatric/Behavioral: Negative.        Objective:   Physical Exam        Assessment & Plan:

## 2012-08-26 ENCOUNTER — Telehealth: Payer: Self-pay | Admitting: *Deleted

## 2012-08-26 NOTE — Telephone Encounter (Signed)
i'm sorry dr Lavena Bullion, triage does not normally do referrals and scheduling of tests, always give them to your nurse

## 2012-08-26 NOTE — Telephone Encounter (Signed)
I have now ordered the referral to a local hepatology clinic for management of her hepatitis C--please let me know if you need anything else from me regarding this. As for the respiratory testing, I have ordered PFTs--could you please schedule those? The CT won't be pursued right now. I'll call her and update her on these issues. Thanks.

## 2012-08-26 NOTE — Addendum Note (Signed)
Addended by: Elenor Legato R on: 08/26/2012 11:45 AM   Modules accepted: Orders, Level of Service

## 2012-08-26 NOTE — Telephone Encounter (Signed)
Pt calls to request referral to "liver clinic" in [place], she states she called for an appt and was told she would need a referral, you saw her 12/12, could you do that? Also, she states you wanted her to have a CT of her chest and resp testing? Please advise You may reach her at the number on her chart

## 2012-08-27 ENCOUNTER — Telehealth: Payer: Self-pay | Admitting: *Deleted

## 2012-08-27 NOTE — Telephone Encounter (Signed)
Pt called again today and would like to talk to Dr Lavena Bullion She was not available yesterday.  She has questions about the interferon injections. Please call her at [phone]

## 2012-09-11 ENCOUNTER — Ambulatory Visit (HOSPITAL_COMMUNITY)
Admission: RE | Admit: 2012-09-11 | Discharge: 2012-09-11 | Disposition: A | Discharge status: Home / Self Care | Payer: Medicaid Other | Source: Ambulatory Visit | Attending: Internal Medicine | Admitting: Internal Medicine

## 2012-09-11 DIAGNOSIS — J45909 Unspecified asthma, uncomplicated: Secondary | ICD-10-CM | POA: Insufficient documentation

## 2012-09-11 MED ORDER — ALBUTEROL SULFATE (5 MG/ML) 0.5% IN NEBU
2.5000 mg | INHALATION_SOLUTION | Freq: Once | RESPIRATORY_TRACT | Status: AC
  Administered 2012-09-11: 2.5 mg via RESPIRATORY_TRACT

## 2012-09-18 ENCOUNTER — Telehealth: Payer: Self-pay | Admitting: *Deleted

## 2012-09-18 NOTE — Telephone Encounter (Signed)
Pt calls and wants recent results of her "COPD" tests, would like for you to call her. You may reach her at the ph# on her snapshot page after 1500 today

## 2012-09-25 ENCOUNTER — Telehealth: Payer: Self-pay | Admitting: *Deleted

## 2012-09-25 NOTE — Telephone Encounter (Signed)
I called patient but she did not answer her phone. I left a message for her and will try to call her back again.

## 2012-09-25 NOTE — Telephone Encounter (Signed)
Dr Anselm Jungling, Please call the patient today and inform her of her PFT results. See Gayle's note

## 2012-09-25 NOTE — Telephone Encounter (Signed)
Pt called AGAIN asking for results of her COPD test done on 1/2. Pt keeps calling but no response from our clinic.  Can you please call her with the results of her test.  They are in [facility] under [facility] Pt # [phone]

## 2012-09-26 NOTE — Telephone Encounter (Signed)
I'll try again to reach her. As Dr. Anselm Jungling noted, we've both tried multiple times to reach her to no avail (as evidenced by her callback on 08/27/2012 after I had left a message the first time I missed her). I feel it is ultimately my responsibility to f/u with her on the PFT results, as I am the physician who ordered them, but I thank Dr. Anselm Jungling for her previous efforts in trying to contact the patient herself.

## 2012-09-26 NOTE — Telephone Encounter (Signed)
I called AGAIN this morning @ 9:30AM and left another message for patient because she did not pick up her phone.  Dr. Lavena Bullion stated that he also tried to call patient but could not get in touch with her because he was the physician who ordered the PFT test.

## 2012-10-01 ENCOUNTER — Encounter: Payer: Medicaid Other | Admitting: Physical Medicine and Rehabilitation

## 2012-10-29 ENCOUNTER — Other Ambulatory Visit: Payer: Self-pay | Admitting: *Deleted

## 2012-10-29 DIAGNOSIS — N951 Menopausal and female climacteric states: Secondary | ICD-10-CM

## 2012-10-29 MED ORDER — ESTROGENS CONJUGATED 0.3 MG PO TABS: 0.3000 mg | ORAL_TABLET | Freq: Every day | ORAL | Status: DC

## 2012-11-01 ENCOUNTER — Other Ambulatory Visit: Payer: Self-pay | Admitting: Radiation Oncology

## 2012-11-01 DIAGNOSIS — B1920 Unspecified viral hepatitis C without hepatic coma: Secondary | ICD-10-CM

## 2012-11-02 ENCOUNTER — Encounter (HOSPITAL_COMMUNITY): Payer: Self-pay | Admitting: *Deleted

## 2012-11-02 ENCOUNTER — Emergency Department (INDEPENDENT_AMBULATORY_CARE_PROVIDER_SITE_OTHER)
Admission: EM | Admit: 2012-11-02 | Discharge: 2012-11-02 | Disposition: A | Discharge status: Home / Self Care | Payer: Medicaid Other | Source: Home / Self Care | Attending: Family Medicine | Admitting: Family Medicine

## 2012-11-02 ENCOUNTER — Emergency Department (INDEPENDENT_AMBULATORY_CARE_PROVIDER_SITE_OTHER): Payer: Medicaid Other

## 2012-11-02 DIAGNOSIS — R0789 Other chest pain: Secondary | ICD-10-CM

## 2012-11-02 DIAGNOSIS — R071 Chest pain on breathing: Secondary | ICD-10-CM

## 2012-11-02 LAB — POCT URINALYSIS DIP (DEVICE)
Bilirubin Urine: NEGATIVE
Glucose, UA: NEGATIVE mg/dL
Hgb urine dipstick: NEGATIVE
Ketones, ur: NEGATIVE mg/dL
Nitrite: NEGATIVE
Protein, ur: NEGATIVE mg/dL
Specific Gravity, Urine: 1.01 (ref 1.005–1.030)
Urobilinogen, UA: 0.2 mg/dL (ref 0.0–1.0)
pH: 7.5 (ref 5.0–8.0)

## 2012-11-02 MED ORDER — KETOROLAC TROMETHAMINE 10 MG PO TABS: 10.0000 mg | ORAL_TABLET | Freq: Four times a day (QID) | ORAL | Status: DC | PRN

## 2012-11-02 NOTE — ED Notes (Signed)
Patient complains of chest pain right side of chest that radiates to middle of back x 5 days. Patient states she now has leg numbness with fever/chills. Patient thinks she may have broken a rib due to coughing spells.

## 2012-11-02 NOTE — ED Provider Notes (Signed)
History     CSN: [id number]  Arrival date & time 11/02/12  1104   First MD Initiated Contact with Patient 11/02/12 1105      Chief Complaint  Patient presents with  . Chest Pain    (Consider location/radiation/quality/duration/timing/severity/associated sxs/prior treatment) Patient is a 57 y.o. female presenting with chest pain. The history is provided by the patient.  Chest Pain Pain location:  R chest Pain quality: sharp   Pain radiates to:  Mid back Pain radiates to the back: yes   Pain severity:  Moderate Progression:  Unchanged Chronicity:  New Context comment:  Seen by lmd for bronchitis and treated but cont to cough, Associated symptoms: cough   Associated symptoms: no shortness of breath   Risk factors: no smoking   Risk factors comment:  Copd.   Past Medical History  Diagnosis Date  . Foot fracture, right 07/2010    per x-ray 07/2010 - oblique comminuted fifth metatarsal fracture, followed by Dr. Patsy Lager  . Depression   . Insomnia   . GERD (gastroesophageal reflux disease)   . Carpal tunnel syndrome   . Cervical spinal stenosis 2008    per x-ray findings 2008  . Pulmonary nodule, right     RUL nodule, 7 mm noted on Chest XRAY - 06/2008, per CT follow up in 2009 - No suspicious pulmonary nodules or masse noted  . Cervicalgia   . Pain in limb   . Lumbago   . Chronic pain syndrome   . Cervical syndrome   . Primary localized osteoarthrosis, pelvic region and thigh   . Lesion of ulnar nerve   . Anxiety   . Osteoporosis   . Unexplained weight loss   . Visual disturbance   . Cough   . Arthritis pain   . Bruises easily   . Wound disruption, post-op, skin   . PONV (postoperative nausea and vomiting)     shortness of breath   . Hepatitis     hx of Hep C     Past Surgical History  Procedure Laterality Date  . Abdominal hysterectomy    . Finger amputated      right ring  . Drain in left ring finger      cat bite  . Broken clavical    .  Cholecystectomy    . Hemorrhoid surgery  11/25/2006  . Cholecystectomy  10/30/2011    Procedure: LAPAROSCOPIC CHOLECYSTECTOMY WITH INTRAOPERATIVE CHOLANGIOGRAM;  Surgeon: Wilmon Arms. Corliss Skains, MD;  Location: [facility];  Service: General;  Laterality: N/A;    Family History  Problem Relation Age of Onset  . Stroke Neg Hx   . Stomach cancer Neg Hx   . Cancer Father     colon/prostate  . Colon cancer Father 71    deceased at age 61 from colon cancer  . Cancer Paternal Grandmother     ovarian    History  Substance Use Topics  . Smoking status: Former Smoker -- 0.10 packs/day for 20 years    Types: Cigarettes    Quit date: 01/08/2011  . Smokeless tobacco: Never Used     Comment: Wants to tr Chantix/ 05/07/12 down to about 1-2 cig per day  . Alcohol Use: No    OB History   Grav Para Term Preterm Abortions TAB SAB Ect Mult Living                  Review of Systems  Constitutional: Negative.   Respiratory: Positive for cough. Negative for  shortness of breath and wheezing.   Cardiovascular: Positive for chest pain.  Psychiatric/Behavioral: Positive for agitation.    Allergies  Morphine and related; Prozac; and Tylenol  Home Medications   Current Outpatient Rx  Name  Route  Sig  Dispense  Refill  . clindamycin (CLEOCIN) 150 MG capsule   Oral   Take 150 mg by mouth 3 (three) times daily.         Marland Kitchen sulfamethoxazole-trimethoprim (BACTRIM DS,SEPTRA DS) 800-160 MG per tablet   Oral   Take 1 tablet by mouth 2 (two) times daily.         Marland Kitchen albuterol (PROVENTIL HFA;VENTOLIN HFA) 108 (90 BASE) MCG/ACT inhaler   Inhalation   Inhale 1 puff into the lungs every 4 (four) hours as needed. Wheezing   1 Inhaler   5   . amLODipine (NORVASC) 2.5 MG tablet   Oral   Take 1 tablet (2.5 mg total) by mouth daily.   30 tablet   11   . Calcium Carb-Cholecalciferol [phone] MG-UNIT TABS   Oral   Take 1 tablet by mouth daily.   30 tablet   11   . DULoxetine (CYMBALTA) 20 MG capsule    Oral   Take 1 capsule (20 mg total) by mouth 2 (two) times daily.   60 capsule   3   . estrogens, conjugated, (PREMARIN) 0.3 MG tablet   Oral   Take 1 tablet (0.3 mg total) by mouth daily. Take daily for 21 days then do not take for 7 days.   30 tablet   2   . gabapentin (NEURONTIN) 600 MG tablet   Oral   Take 1 tablet (600 mg total) by mouth QID.   120 tablet   2   . ketorolac (TORADOL) 10 MG tablet   Oral   Take 1 tablet (10 mg total) by mouth every 6 (six) hours as needed for pain.   20 tablet   0   . nicotine (NICODERM CQ - DOSED IN MG/24 HOURS) 14 mg/24hr patch      One 14mg  patch qdaily x 6 weeks, then one 7mg  patch qdaily x 2 weeks, then stop   31 patch   2   . omeprazole (PRILOSEC) 40 MG capsule   Oral   Take 1 capsule (40 mg total) by mouth daily.   30 capsule   6   . predniSONE (DELTASONE) 20 MG tablet   Oral   Take 2 tablets (40 mg total) by mouth daily.   10 tablet   0   . traZODone (DESYREL) 25 mg TABS      Take one quarter of a tab for 12.5mg  at bed time.   30 tablet   5     BP 136/88  Pulse 91  Temp(Src) 99.9 F (37.7 C) (Oral)  Resp 22  SpO2 97%  LMP 10/23/2002  Physical Exam  Nursing note and vitals reviewed. Constitutional: She is oriented to person, place, and time. She appears well-developed and well-nourished. She appears distressed.  HENT:  Head: Normocephalic and atraumatic.  Eyes: Pupils are equal, round, and reactive to light.  Neck: Neck supple.  Cardiovascular: Normal rate, regular rhythm, normal heart sounds and intact distal pulses.   Pulmonary/Chest: No respiratory distress. She has no wheezes. She has rhonchi. She has no rales. She exhibits tenderness.  Abdominal: Soft. Bowel sounds are normal.  Neurological: She is alert and oriented to person, place, and time.  Skin: Skin is warm and dry.  ED Course  Procedures (including critical care time)  Labs Reviewed  POCT URINALYSIS DIP (DEVICE) - Abnormal; Notable  for the following:    Leukocytes, UA TRACE (*)    All other components within normal limits   Dg Chest 2 View  11/02/2012  *RADIOLOGY REPORT*  Clinical Data: Chest pain, shortness of breath  CHEST - 2 VIEW  Comparison: 08/21/2012  Findings: Cardiomediastinal silhouette is stable.  No acute infiltrate or pleural effusion.  No pulmonary edema.  Old right upper rib fractures again noted.  Stable mild degenerative changes thoracic spine.  IMPRESSION: No active disease.  No significant change.   Original Report Authenticated By: Natasha Mead, M.D.      1. Acute chest wall pain       MDM  [id number]. X-rays reviewed and report per radiologist.         Linna Hoff, MD 11/02/12 1226

## 2012-12-16 ENCOUNTER — Encounter: Payer: Self-pay | Admitting: Internal Medicine

## 2012-12-16 DIAGNOSIS — R269 Unspecified abnormalities of gait and mobility: Secondary | ICD-10-CM | POA: Insufficient documentation

## 2013-02-10 ENCOUNTER — Other Ambulatory Visit: Payer: Self-pay | Admitting: *Deleted

## 2013-02-10 MED ORDER — OMEPRAZOLE 40 MG PO CPDR: 40.0000 mg | DELAYED_RELEASE_CAPSULE | Freq: Every day | ORAL | Status: DC

## 2013-02-18 ENCOUNTER — Encounter: Payer: Self-pay | Admitting: Internal Medicine

## 2013-02-18 ENCOUNTER — Ambulatory Visit (INDEPENDENT_AMBULATORY_CARE_PROVIDER_SITE_OTHER): Payer: Medicare Other | Admitting: Internal Medicine

## 2013-02-18 ENCOUNTER — Telehealth: Payer: Self-pay | Admitting: *Deleted

## 2013-02-18 VITALS — BP 112/75 | HR 75 | Temp 97.5°F | Ht 66.0 in | Wt 121.2 lb

## 2013-02-18 DIAGNOSIS — K219 Gastro-esophageal reflux disease without esophagitis: Secondary | ICD-10-CM

## 2013-02-18 DIAGNOSIS — E785 Hyperlipidemia, unspecified: Secondary | ICD-10-CM

## 2013-02-18 DIAGNOSIS — M810 Age-related osteoporosis without current pathological fracture: Secondary | ICD-10-CM

## 2013-02-18 DIAGNOSIS — L989 Disorder of the skin and subcutaneous tissue, unspecified: Secondary | ICD-10-CM

## 2013-02-18 DIAGNOSIS — I10 Essential (primary) hypertension: Secondary | ICD-10-CM

## 2013-02-18 DIAGNOSIS — N951 Menopausal and female climacteric states: Secondary | ICD-10-CM

## 2013-02-18 DIAGNOSIS — G4700 Insomnia, unspecified: Secondary | ICD-10-CM

## 2013-02-18 DIAGNOSIS — B1920 Unspecified viral hepatitis C without hepatic coma: Secondary | ICD-10-CM

## 2013-02-18 MED ORDER — ALBUTEROL SULFATE HFA 108 (90 BASE) MCG/ACT IN AERS: 1.0000 | INHALATION_SPRAY | RESPIRATORY_TRACT | Status: DC | PRN

## 2013-02-18 MED ORDER — IBANDRONATE SODIUM 3 MG/3ML IV SOLN: 3.0000 mg | Freq: Once | INTRAVENOUS | Status: DC

## 2013-02-18 MED ORDER — TRAZODONE 25 MG HALF TABLET: ORAL_TABLET | ORAL | Status: DC

## 2013-02-18 MED ORDER — IBANDRONATE SODIUM 3 MG/3ML IV SOLN: 3.0000 mg | INTRAVENOUS | Status: DC

## 2013-02-18 MED ORDER — GUAIFENESIN ER 600 MG PO TB12: 1200.0000 mg | ORAL_TABLET | Freq: Two times a day (BID) | ORAL | Status: DC

## 2013-02-18 MED ORDER — GABAPENTIN 300 MG PO CAPS: 600.0000 mg | ORAL_CAPSULE | Freq: Four times a day (QID) | ORAL | Status: DC

## 2013-02-18 MED ORDER — AMLODIPINE BESYLATE 2.5 MG PO TABS: 2.5000 mg | ORAL_TABLET | Freq: Every day | ORAL | Status: DC

## 2013-02-18 MED ORDER — GABAPENTIN 600 MG PO TABS: 600.0000 mg | ORAL_TABLET | Freq: Four times a day (QID) | ORAL | Status: DC

## 2013-02-18 NOTE — Progress Notes (Signed)
Patient ID: Amanda Bennett, female   DOB: 1955/12/20, 57 y.o.   MRN: [id number]  Subjective:   Patient ID: Amanda Bennett female   DOB: [birth year] 57 y.o.   MRN: [id number]  HPI: [name] is a 57 y.o. female with pmh outlined below presenting to the clinic for routine follow up.  Has had some nasal congestion, runny nose, postnasal drip, cough for 4 days. No fever, chills, CP or SOB. No known sick contacts.  Has HCV and appt for August with [facility]. Denies abdominal pain, N, V, D.  Insomnia which trazodone helped. Needs RF.  Chronic pain but does not want narcotics. On gabapentin.  HTN controlled w amlodipine.  Osteoporosis, could not tolerate oral bisphosphonantes, gets q53mo IV Boniva.   Has tanned skin and history of heavy sun exposure. Follows w dermatologist, says last saw a few months ago. Previously w wart on L forearm that has resolved, inclusion cyst on sternum which has been removed.      Past Medical History  Diagnosis Date  . Foot fracture, right 07/2010    per x-ray 07/2010 - oblique comminuted fifth metatarsal fracture, followed by Dr. Patsy Lager  . Depression   . Insomnia   . GERD (gastroesophageal reflux disease)   . Carpal tunnel syndrome   . Cervical spinal stenosis 2008    per x-ray findings 2008  . Pulmonary nodule, right     RUL nodule, 7 mm noted on Chest XRAY - 06/2008, per CT follow up in 2009 - No suspicious pulmonary nodules or masse noted  . Cervicalgia   . Pain in limb   . Lumbago   . Chronic pain syndrome   . Cervical syndrome   . Primary localized osteoarthrosis, pelvic region and thigh   . Lesion of ulnar nerve   . Anxiety   . Osteoporosis   . Unexplained weight loss   . Visual disturbance   . Cough   . Arthritis pain   . Bruises easily   . Wound disruption, post-op, skin   . PONV (postoperative nausea and vomiting)     shortness of breath   . Hepatitis     hx of Hep C    Current Outpatient Prescriptions  Medication  Sig Dispense Refill  . citalopram (CELEXA) 20 MG tablet Take 20 mg by mouth daily.      Marland Kitchen albuterol (PROVENTIL HFA;VENTOLIN HFA) 108 (90 BASE) MCG/ACT inhaler Inhale 1 puff into the lungs every 4 (four) hours as needed. Wheezing  1 Inhaler  5  . amLODipine (NORVASC) 2.5 MG tablet Take 1 tablet (2.5 mg total) by mouth daily.  30 tablet  11  . Calcium Carb-Cholecalciferol [phone] MG-UNIT TABS Take 1 tablet by mouth daily.  30 tablet  11  . DULoxetine (CYMBALTA) 20 MG capsule Take 1 capsule (20 mg total) by mouth 2 (two) times daily.  60 capsule  3  . estrogens, conjugated, (PREMARIN) 0.3 MG tablet Take 1 tablet (0.3 mg total) by mouth daily. Take daily for 21 days then do not take for 7 days.  30 tablet  2  . gabapentin (NEURONTIN) 600 MG tablet Take 1 tablet (600 mg total) by mouth QID.  120 tablet  2  . omeprazole (PRILOSEC) 40 MG capsule Take 1 capsule (40 mg total) by mouth daily.  30 capsule  6  . traZODone (DESYREL) 25 mg TABS Take one quarter of a tab for 12.5mg  at bed time.  30 tablet  5   No  current facility-administered medications for this visit.   Family History  Problem Relation Age of Onset  . Stroke Neg Hx   . Stomach cancer Neg Hx   . Cancer Father     colon/prostate  . Colon cancer Father 25    deceased at age 79 from colon cancer  . Cancer Paternal Grandmother     ovarian   History   Social History  . Marital Status: Single    Spouse Name: N/A    Number of Children: N/A  . Years of Education: N/A   Social History Main Topics  . Smoking status: Former Smoker -- 0.10 packs/day for 20 years    Types: Cigarettes    Quit date: 01/08/2011  . Smokeless tobacco: Never Used     Comment: Wants to tr Chantix/ 05/07/12 down to about 1-2 cig per day  . Alcohol Use: No  . Drug Use: No  . Sexually Active: No   Other Topics Concern  . None   Social History Narrative   Financial assistance approved for 100% discount at [facility] and has [facility] card, also given [place] card.   per Rudell Cobb 12/09/2009   Review of Systems: 10 pt ROS performed, pertinent positives and negatives noted in HPI Objective:  Physical Exam: Filed Vitals:   02/18/13 1435  BP: 112/75  Pulse: 75  Temp: 97.5 F (36.4 C)  TempSrc: Oral  Height: 5\' 6"  (1.676 m)  Weight: 121 lb 3.2 oz (54.976 kg)  SpO2: 99%   Constitutional: Vital signs reviewed.  Patient is thin woman in no acute distress and cooperative with exam. Alert and oriented x3.  Mouth: no erythema or exudates, MMM Eyes: PERRL, EOMI, conjunctivae normal, No scleral icterus.  Neck: Supple, Trachea midline normal ROM Cardiovascular: RRR, S1 normal, S2 normal, no MRG, pulses symmetric and intact bilaterally Pulmonary/Chest: CTAB, no wheezes, rales, or rhonchi Abdominal: Soft. Non-tender, non-distended, bowel sounds are normal, no masses, organomegaly, or guarding present.  Musculoskeletal: No joint deformities, erythema, or stiffness, ROM full and no nontender Hematology: no cervical adenopathy or visible bruising Neurological: A&O x3, Strength is normal and symmetric bilaterally, cranial nerve II-XII are grossly intact, no focal motor deficit, sensory intact to light touch bilaterally.  Skin: Chronic sun exposed changes with diffuse lentigos. NO irregular appearing hyperpigmented lesions on arms or trunk. Gets annual skin checks w dermatologist.  Psychiatric: Normal mood and affect. Assessment & Plan:   Please see problem-based charting for assessment and plan.

## 2013-02-18 NOTE — Assessment & Plan Note (Signed)
s/p full hysterectomy so no risk for endometrial cancer but she is still at risk for breast cancer. Has stopped smoknig. Would like to continue premarin for a few more months at lower dose - 0.3mg  daily. Will consider cessation of HRT at next visit.

## 2013-02-18 NOTE — Assessment & Plan Note (Signed)
BP Readings from Last 3 Encounters:  02/18/13 112/75  11/02/12 136/88  08/21/12 135/85    Lab Results  Component Value Date   NA 137 08/21/2012   K 4.0 08/21/2012   CREATININE 0.59 08/21/2012    Assessment: Blood pressure control: controlled Progress toward BP goal:  at goal   Plan: Medications:  continue current medications Educational resources provided: brochure Self management tools provided: home blood pressure logbook

## 2013-02-18 NOTE — Assessment & Plan Note (Signed)
Removed superinfected epidermal inclusion cyst several months ago. Now sees dermatologist annually for skin checks.  Recommended continued skin checks.

## 2013-02-18 NOTE — Assessment & Plan Note (Signed)
Pt does not want to start ANY lipid lowering therapy today. Discussed checking cholesterol at next visit, reevaluate at that time

## 2013-02-18 NOTE — Progress Notes (Signed)
Case discussed with Dr. Ziemer at the time of the visit.  We reviewed the resident's history and exam and pertinent patient test results.  I agree with the assessment, diagnosis and plan of care documented in the resident's note. 

## 2013-02-18 NOTE — Assessment & Plan Note (Addendum)
Lab Results  Component Value Date   ALT 67* 04/08/2012   AST 81* 04/08/2012   ALKPHOS 128* 04/08/2012   BILITOT 0.5 04/08/2012   No abd pain N/V/D. Has appt w liver clinic in August.

## 2013-02-18 NOTE — Assessment & Plan Note (Signed)
Reordered boniva infusion today w 2 RF for q3 months

## 2013-02-18 NOTE — Assessment & Plan Note (Signed)
Refilled omeprazole.

## 2013-02-18 NOTE — Telephone Encounter (Signed)
Fax from [facility] - Gabapentin 600mg  QID needs prior authorization. But states if change to Gabapentin 300mg  2 caps QID; Medicaid will possibly cover.  Needs new rx   Thanks

## 2013-02-18 NOTE — Telephone Encounter (Signed)
This was sent.

## 2013-02-18 NOTE — Patient Instructions (Addendum)
1. Keep taking your meds - you are doing great.  2. Follow up with liver clinic. 3. Come back in 6 months for follow up.

## 2013-02-18 NOTE — Assessment & Plan Note (Addendum)
Stable. Refilled trazodone per pt request.

## 2013-03-06 ENCOUNTER — Other Ambulatory Visit: Payer: Self-pay | Admitting: *Deleted

## 2013-03-06 DIAGNOSIS — N951 Menopausal and female climacteric states: Secondary | ICD-10-CM

## 2013-03-06 MED ORDER — ESTROGENS CONJUGATED 0.3 MG PO TABS: 0.3000 mg | ORAL_TABLET | Freq: Every day | ORAL | Status: DC

## 2013-03-06 NOTE — Telephone Encounter (Signed)
Dr Heloise Beecham dicussed at June visit. Will refill and pt to F/U Nov or Dec.

## 2013-03-24 ENCOUNTER — Other Ambulatory Visit (HOSPITAL_COMMUNITY): Payer: Self-pay | Admitting: *Deleted

## 2013-03-25 ENCOUNTER — Encounter (HOSPITAL_COMMUNITY)
Admission: RE | Admit: 2013-03-25 | Discharge: 2013-03-25 | Disposition: A | Discharge status: Home / Self Care | Payer: Medicare Other | Source: Ambulatory Visit | Attending: Internal Medicine | Admitting: Internal Medicine

## 2013-03-25 DIAGNOSIS — M810 Age-related osteoporosis without current pathological fracture: Secondary | ICD-10-CM | POA: Insufficient documentation

## 2013-03-25 MED ORDER — IBANDRONATE SODIUM 3 MG/3ML IV SOLN: 3.0000 mg | Freq: Once | INTRAVENOUS | Status: DC

## 2013-03-25 MED ORDER — IBANDRONATE SODIUM 3 MG/3ML IV SOLN
INTRAVENOUS | Status: AC
  Administered 2013-03-25: 3 mg
  Filled 2013-03-25: qty 3

## 2013-04-06 ENCOUNTER — Ambulatory Visit (INDEPENDENT_AMBULATORY_CARE_PROVIDER_SITE_OTHER): Payer: Medicare Other | Admitting: Family Medicine

## 2013-04-06 VITALS — BP 145/87 | Ht 64.0 in | Wt 122.0 lb

## 2013-04-06 DIAGNOSIS — IMO0001 Reserved for inherently not codable concepts without codable children: Secondary | ICD-10-CM

## 2013-04-06 DIAGNOSIS — S63279A Dislocation of unspecified interphalangeal joint of unspecified finger, initial encounter: Secondary | ICD-10-CM

## 2013-04-07 DIAGNOSIS — IMO0002 Reserved for concepts with insufficient information to code with codable children: Secondary | ICD-10-CM | POA: Insufficient documentation

## 2013-04-07 NOTE — Progress Notes (Signed)
  Subjective:    Patient ID: Amanda Bennett, female    DOB: [birth year], 57 y.o.   MRN: [id number]  HPI  Right long finger pain. 6 days ago was holding her course with full right and worse reared back injuring her hand. She had some initial swelling which has improved. The pain has continued. She is right-hand dominant. 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  Review of Systems Mild swelling, no erythema of the finger or hand. No fever.    Objective:   Physical Exam  Vital signs reviewed. GENERAL: Well developed, well nourished, no acute distress Right long finger tender to palpation on the lateral portion of the PIP joint. She has full extension with mild pain and flexion limited to about 90 secondary some stiffness and pain. Distally she is neuro grossly intact. There is no erythema or warmth. Rest of the hand exam is normal except for missing distal phalanx on the ring finger.  ULTRASOUND:  Small effusion around the PIP of the right long finger. The collateral ligament is intact on one side but not the other.      Assessment & Plan:  Collateral ligament rupture. Placed her in any extension block the splint so she's in small amount of flexion. I'll see her back in 2-3 weeks. We'll plan to keep her 2- 4 weeks in a splint pending how she does. Will probably re [place] when rtc

## 2013-04-10 DIAGNOSIS — I10 Essential (primary) hypertension: Secondary | ICD-10-CM | POA: Insufficient documentation

## 2013-04-10 DIAGNOSIS — J449 Chronic obstructive pulmonary disease, unspecified: Secondary | ICD-10-CM | POA: Insufficient documentation

## 2013-04-27 ENCOUNTER — Ambulatory Visit (INDEPENDENT_AMBULATORY_CARE_PROVIDER_SITE_OTHER): Payer: Medicare Other | Admitting: Family Medicine

## 2013-04-27 VITALS — BP 138/84 | Ht 64.0 in | Wt 120.0 lb

## 2013-04-27 DIAGNOSIS — IMO0001 Reserved for inherently not codable concepts without codable children: Secondary | ICD-10-CM

## 2013-04-27 DIAGNOSIS — Z5189 Encounter for other specified aftercare: Secondary | ICD-10-CM

## 2013-04-27 NOTE — Progress Notes (Signed)
Amanda Bennett is a 57 y.o. female who presents today for f/u of R hand, 3rd digit, ulnar collateral ligament of the PIP rupture.  Pt is R handed female who was previously seen on 7/28 for acute middle finger injury of the R hand when she was riding horses.  At that visit, she was dx with ulnar collateral ligament injury of the PIP 3rd digit R hand and placed in extension splint of the finger w/ slight flexion.  Since that visit, pt is c/o similar pain, maybe slightly increased that is limiting her sleeping capabilities.  She has been compliant with the splint, only taking it off with showering, and has not had any trauma to the area as well.  Pt denies worsening edema or paraesthesias in her middle finger.  Pain described as sharp with any type of pressure to the area or slight movement.      Current Outpatient Prescriptions on File Prior to Visit  Medication Sig Dispense Refill  . albuterol (PROVENTIL HFA;VENTOLIN HFA) 108 (90 BASE) MCG/ACT inhaler Inhale 1 puff into the lungs every 4 (four) hours as needed. Wheezing  1 Inhaler  5  . amLODipine (NORVASC) 2.5 MG tablet Take 1 tablet (2.5 mg total) by mouth daily.  30 tablet  11  . Calcium Carb-Cholecalciferol [phone] MG-UNIT TABS Take 1 tablet by mouth daily.  30 tablet  11  . citalopram (CELEXA) 20 MG tablet Take 20 mg by mouth daily.      . DULoxetine (CYMBALTA) 20 MG capsule Take 1 capsule (20 mg total) by mouth 2 (two) times daily.  60 capsule  3  . estrogens, conjugated, (PREMARIN) 0.3 MG tablet Take 1 tablet (0.3 mg total) by mouth daily. Take daily for 21 days then do not take for 7 days.  30 tablet  5  . gabapentin (NEURONTIN) 300 MG capsule Take 2 capsules (600 mg total) by mouth 4 (four) times daily.  240 capsule  0  . guaiFENesin (MUCINEX) 600 MG 12 hr tablet Take 2 tablets (1,200 mg total) by mouth 2 (two) times daily.  20 tablet  0  . ibandronate (BONIVA) 3 MG/3ML SOLN injection Inject 3 mLs (3 mg total) into the vein every 3 (three)  months.  3.6 mL  2  . omeprazole (PRILOSEC) 40 MG capsule Take 1 capsule (40 mg total) by mouth daily.  30 capsule  6  . traZODone (DESYREL) 25 mg TABS Take one quarter of a tab for 12.5mg  at bed time.  30 tablet  5   No current facility-administered medications on file prior to visit.    ROS: Per HPI.  All other systems reviewed and are negative.   Physical Exam Filed Vitals:   04/27/13 1334  BP: 138/84    Physical Examination: General appearance - alert, well appearing, and in no distress R hand - 4th digit Distal phalanx amputated  3rd digit - Mild edema on ulnar side of PIP joint, TTP at the PIP joint ulnar > radial side, no TTP at MCP/DIP jt, full extension/flexion of the finger at each joint. Neurovascular intact throughout   MSK [place] - Small hypoechoic region of ulnar side of PIP joint of 3rd finger with collateral ligament identified and without gross deficits.

## 2013-04-27 NOTE — Assessment & Plan Note (Addendum)
MSK [place] of area shows healing collateral ligament with small amount of edema.  She is c/o stiffness in the joint and recommended return to activity gradually without the splint and slowly ease back into her normal activities as tolerated.  Will see back in one month and re [place] at that time.

## 2013-04-28 NOTE — Progress Notes (Signed)
[facility] Attending Note: I have seen and examined this patient. I have discussed this patient with the resident and reviewed the assessment and plan as documented above. I agree with the resident's findings and plan. Ithink given the stiffnes she is having and how well it looks on [place] (resolution of edema, some new strands of ligament apparent) we cann take her out of the splint. Caution given for no significant grasping or heavy objects 2-3 more weeks. F/u PRN

## 2013-06-18 ENCOUNTER — Encounter: Payer: Self-pay | Admitting: Internal Medicine

## 2013-06-18 ENCOUNTER — Ambulatory Visit (INDEPENDENT_AMBULATORY_CARE_PROVIDER_SITE_OTHER): Payer: Medicare Other | Admitting: Internal Medicine

## 2013-06-18 VITALS — BP 142/95 | HR 79 | Temp 98.6°F | Ht 60.0 in | Wt 119.6 lb

## 2013-06-18 DIAGNOSIS — I10 Essential (primary) hypertension: Secondary | ICD-10-CM

## 2013-06-18 DIAGNOSIS — F329 Major depressive disorder, single episode, unspecified: Secondary | ICD-10-CM

## 2013-06-18 DIAGNOSIS — Z23 Encounter for immunization: Secondary | ICD-10-CM

## 2013-06-18 DIAGNOSIS — F3289 Other specified depressive episodes: Secondary | ICD-10-CM

## 2013-06-18 DIAGNOSIS — Z0000 Encounter for general adult medical examination without abnormal findings: Secondary | ICD-10-CM

## 2013-06-18 DIAGNOSIS — R1031 Right lower quadrant pain: Secondary | ICD-10-CM

## 2013-06-18 MED ORDER — CITALOPRAM HYDROBROMIDE 20 MG PO TABS: 20.0000 mg | ORAL_TABLET | Freq: Every day | ORAL | Status: DC

## 2013-06-18 MED ORDER — CITALOPRAM HYDROBROMIDE 40 MG PO TABS: 40.0000 mg | ORAL_TABLET | Freq: Every day | ORAL | Status: DC

## 2013-06-18 MED ORDER — AMLODIPINE BESYLATE 5 MG PO TABS: 5.0000 mg | ORAL_TABLET | Freq: Every day | ORAL | Status: DC

## 2013-06-18 NOTE — Progress Notes (Signed)
HPI The patient is a 57 y.o. female with a history of HCV, depression, HTN, chronic pain, presenting for an acute visit for R groin pain.  The patient notes a 3-month history of R groin pain, as well as R flank pain.  The pain has a constant dull component, though with occasional exacerbation of sharp pain. No aggravating or alleviating factors.  The patient no dysuria, hematuria, fevers, new medications.  The patient has never had kidney stones.  The patient notes significant stress, including a hospitalized family member and a new roommate.  She believes that her Celexa is not adequately handling her symptoms of anxiety and depressed mood. She asks about restarting clonazepam.  Patient has history of hypertension. Blood pressure is mildly elevated today. Patient notes that at a recent dentist office visit, her blood pressure was in the systolic 170s.  ROS: General: no fevers, chills, changes in weight, changes in appetite Skin: no rash HEENT: no blurry vision, hearing changes, sore throat Pulm: no dyspnea, coughing, wheezing CV: no chest pain, palpitations, shortness of breath Abd: no abdominal pain, nausea/vomiting, diarrhea/constipation GU: see HPI Ext: no arthralgias, myalgias Neuro: no weakness, numbness, or tingling  Filed Vitals:   06/18/13 1329  BP: 142/95  Pulse: 79  Temp: 98.6 F (37 C)    PEX General: alert, cooperative, and in no apparent distress HEENT: pupils equal round and reactive to light, vision grossly intact, oropharynx clear and non-erythematous  Neck: supple Lungs: clear to ascultation bilaterally, normal work of respiration, no wheezes, rales, ronchi Heart: regular rate and rhythm, no murmurs, gallops, or rubs Abdomen: soft, non-tender in RLQ or R groin, with no masses or fullness felt in R groin, non-distended, normal bowel sounds Back: R flank with no tenderness to palpation Extremities: no cyanosis, clubbing, or edema Neurologic: alert & oriented X3,  cranial nerves II-XII intact, strength grossly intact, sensation intact to light touch  Current Outpatient Prescriptions on File Prior to Visit  Medication Sig Dispense Refill  . albuterol (PROVENTIL HFA;VENTOLIN HFA) 108 (90 BASE) MCG/ACT inhaler Inhale 1 puff into the lungs every 4 (four) hours as needed. Wheezing  1 Inhaler  5  . amLODipine (NORVASC) 2.5 MG tablet Take 1 tablet (2.5 mg total) by mouth daily.  30 tablet  11  . Calcium Carb-Cholecalciferol [phone] MG-UNIT TABS Take 1 tablet by mouth daily.  30 tablet  11  . citalopram (CELEXA) 20 MG tablet Take 20 mg by mouth daily.      . DULoxetine (CYMBALTA) 20 MG capsule Take 1 capsule (20 mg total) by mouth 2 (two) times daily.  60 capsule  3  . estrogens, conjugated, (PREMARIN) 0.3 MG tablet Take 1 tablet (0.3 mg total) by mouth daily. Take daily for 21 days then do not take for 7 days.  30 tablet  5  . gabapentin (NEURONTIN) 300 MG capsule Take 2 capsules (600 mg total) by mouth 4 (four) times daily.  240 capsule  0  . guaiFENesin (MUCINEX) 600 MG 12 hr tablet Take 2 tablets (1,200 mg total) by mouth 2 (two) times daily.  20 tablet  0  . ibandronate (BONIVA) 3 MG/3ML SOLN injection Inject 3 mLs (3 mg total) into the vein every 3 (three) months.  3.6 mL  2  . omeprazole (PRILOSEC) 40 MG capsule Take 1 capsule (40 mg total) by mouth daily.  30 capsule  6  . traZODone (DESYREL) 25 mg TABS Take one quarter of a tab for 12.5mg  at bed time.  30  tablet  5   No current facility-administered medications on file prior to visit.    Assessment/Plan

## 2013-06-18 NOTE — Assessment & Plan Note (Addendum)
The patient notes a one-month history of right groin pain, with no specific physical exam findings. Differential includes nephrolithiasis, infection, hernia, or other intra-abdominal pathology. -Will start with urinalysis, BMET, CBC -If unremarkable, consider CT abdomen pelvis versus ultrasound right kidney  Addendum 10/10: Initial labs were unremarkable, including no RBC or WBC's in urine, and normal serum WBC count.  I called patient with this result, who noted continued right flank and groin pain.  Will pursue work-up as detailed above, with the next step being CT abd/pelvis stone protocol (first w/o, then w/ contrast).  Addendum 10/14: CT showed no abnormality, except a posterior 12th rib fracture of unclear clinical significance (?may be contributing to pt's R flank pain).  Called patient with these results.  Will prescribe Mobic 15 mg daily for pain, and have patient get a follow-up appointment for re-evaluation.

## 2013-06-18 NOTE — Assessment & Plan Note (Signed)
BP Readings from Last 3 Encounters:  06/18/13 142/95  04/27/13 138/84  04/06/13 145/87    Lab Results  Component Value Date   NA 137 08/21/2012   K 4.0 08/21/2012   CREATININE 0.59 08/21/2012    Assessment: Blood pressure control: mildly elevated Progress toward BP goal:  at goal Comments: BP mildly elevated stay, and patient notes elevated blood pressure at other position office visits. Will increase amlodipine today.  Plan: Medications:  Increase amlodipine to 5 mg daily Educational resources provided:   Self management tools provided:   Other plans: Recheck at next visit

## 2013-06-18 NOTE — Assessment & Plan Note (Signed)
The patient has a history of depression, and also notes anxiety. She previously followed with psychiatry, as recently as 6 months ago. She's currently on Celexa. 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 -Increase Celexa to 40 mg daily -If persistent symptoms, consider referral by psychiatry

## 2013-06-18 NOTE — Assessment & Plan Note (Signed)
Flu shot given today

## 2013-06-18 NOTE — Patient Instructions (Signed)
General Instructions: For your groin pain, we are checking a urine sample, and a few labs. -if these are normal, we will likely need to perform either an Ultrasound or a CT to determine the cause of this pain.  We will contact you with these results.  For your blood pressure, we are increasing Amlodipine to 5 mg daily.  For your Anxiety, we are increasing your Celexa to 40 mg daily.  It may take a few weeks for this increased dose to take effect.  Please return for a follow-up visit in 6 months, or sooner if needed.   Treatment Goals:  Goals (1 Years of Data) as of 06/18/13     Lifestyle    . Prevent Falls       Progress Toward Treatment Goals:  Treatment Goal 06/18/2013  Blood pressure at goal  Prevent falls at goal    Self Care Goals & Plans:  Self Care Goal 02/18/2013  Manage my medications take my medicines as prescribed; bring my medications to every visit; refill my medications on time  Eat healthy foods drink diet soda or water instead of juice or soda; eat more vegetables; eat foods that are low in salt; eat baked foods instead of fried foods    No flowsheet data found.   Care Management & Community Referrals:  Referral 06/18/2013  Referrals made for care management support none needed

## 2013-06-19 LAB — URINALYSIS, COMPLETE
Bacteria, UA: NONE SEEN
Bilirubin Urine: NEGATIVE
Casts: NONE SEEN
Crystals: NONE SEEN
Glucose, UA: NEGATIVE mg/dL
Hgb urine dipstick: NEGATIVE
Ketones, ur: NEGATIVE mg/dL
Leukocytes, UA: NEGATIVE
Nitrite: NEGATIVE
Protein, ur: NEGATIVE mg/dL
Specific Gravity, Urine: <1.005 — ABNORMAL LOW (ref 1.005–1.030)
Squamous Epithelial / LPF: NONE SEEN
Urobilinogen, UA: 0.2 mg/dL (ref 0.0–1.0)
pH: 6 (ref 5.0–8.0)

## 2013-06-19 LAB — CBC WITH DIFFERENTIAL/PLATELET
Basophils Absolute: 0 10*3/uL (ref 0.0–0.1)
Basophils Relative: 0 % (ref 0–1)
Eosinophils Absolute: 0 10*3/uL (ref 0.0–0.7)
Eosinophils Relative: 1 % (ref 0–5)
HCT: 38.6 % (ref 36.0–46.0)
Hemoglobin: 13.6 g/dL (ref 12.0–15.0)
Lymphocytes Relative: 24 % (ref 12–46)
Lymphs Abs: 1.3 10*3/uL (ref 0.7–4.0)
MCH: 33.9 pg (ref 26.0–34.0)
MCHC: 35.2 g/dL (ref 30.0–36.0)
MCV: 96.3 fL (ref 78.0–100.0)
Monocytes Absolute: 0.5 10*3/uL (ref 0.1–1.0)
Monocytes Relative: 9 % (ref 3–12)
Neutro Abs: 3.7 10*3/uL (ref 1.7–7.7)
Neutrophils Relative %: 66 % (ref 43–77)
Platelets: 224 10*3/uL (ref 150–400)
RBC: 4.01 MIL/uL (ref 3.87–5.11)
RDW: 13.6 % (ref 11.5–15.5)
WBC: 5.6 10*3/uL (ref 4.0–10.5)

## 2013-06-19 LAB — BASIC METABOLIC PANEL
BUN: 6 mg/dL (ref 6–23)
CO2: 24 mEq/L (ref 19–32)
Calcium: 9.3 mg/dL (ref 8.4–10.5)
Chloride: 101 mEq/L (ref 96–112)
Creat: 0.46 mg/dL — ABNORMAL LOW (ref 0.50–1.10)
Glucose, Bld: 87 mg/dL (ref 70–99)
Potassium: 3.8 mEq/L (ref 3.5–5.3)
Sodium: 133 mEq/L — ABNORMAL LOW (ref 135–145)

## 2013-06-19 NOTE — Addendum Note (Signed)
Addended by: Linward Headland on: 06/19/2013 10:16 AM   Modules accepted: Orders

## 2013-06-19 NOTE — Progress Notes (Signed)
Case discussed with Dr. Brown at the time of the visit.  We reviewed the resident's history and exam and pertinent patient test results.  I agree with the assessment, diagnosis, and plan of care documented in the resident's note. 

## 2013-06-22 ENCOUNTER — Ambulatory Visit (HOSPITAL_COMMUNITY)
Admission: RE | Admit: 2013-06-22 | Discharge: 2013-06-22 | Disposition: A | Discharge status: Home / Self Care | Payer: Medicare Other | Source: Ambulatory Visit | Attending: Internal Medicine | Admitting: Internal Medicine

## 2013-06-22 DIAGNOSIS — X58XXXA Exposure to other specified factors, initial encounter: Secondary | ICD-10-CM | POA: Insufficient documentation

## 2013-06-22 DIAGNOSIS — R109 Unspecified abdominal pain: Secondary | ICD-10-CM | POA: Insufficient documentation

## 2013-06-22 DIAGNOSIS — S2239XA Fracture of one rib, unspecified side, initial encounter for closed fracture: Secondary | ICD-10-CM | POA: Insufficient documentation

## 2013-06-22 DIAGNOSIS — R1031 Right lower quadrant pain: Secondary | ICD-10-CM

## 2013-06-22 MED ORDER — IOHEXOL 300 MG/ML  SOLN
80.0000 mL | Freq: Once | INTRAMUSCULAR | Status: AC | PRN
  Administered 2013-06-22: 75 mL via INTRAVENOUS

## 2013-06-23 MED ORDER — MELOXICAM 7.5 MG PO TABS: 15.0000 mg | ORAL_TABLET | Freq: Every day | ORAL | Status: DC

## 2013-06-23 NOTE — Addendum Note (Signed)
Addended by: Linward Headland on: 06/23/2013 05:23 PM   Modules accepted: Orders

## 2013-06-24 ENCOUNTER — Other Ambulatory Visit (HOSPITAL_COMMUNITY): Payer: Medicare Other

## 2013-07-22 ENCOUNTER — Other Ambulatory Visit: Payer: Self-pay | Admitting: Internal Medicine

## 2013-07-22 DIAGNOSIS — C220 Liver cell carcinoma: Secondary | ICD-10-CM

## 2013-07-24 ENCOUNTER — Ambulatory Visit
Admission: RE | Admit: 2013-07-24 | Discharge: 2013-07-24 | Disposition: A | Discharge status: Home / Self Care | Payer: Medicare Other | Source: Ambulatory Visit | Attending: Internal Medicine | Admitting: Internal Medicine

## 2013-07-24 DIAGNOSIS — C220 Liver cell carcinoma: Secondary | ICD-10-CM

## 2013-08-24 ENCOUNTER — Other Ambulatory Visit: Payer: Self-pay | Admitting: *Deleted

## 2013-08-24 MED ORDER — OMEPRAZOLE 40 MG PO CPDR: 40.0000 mg | DELAYED_RELEASE_CAPSULE | Freq: Every day | ORAL | Status: DC

## 2013-09-08 ENCOUNTER — Other Ambulatory Visit: Payer: Self-pay | Admitting: *Deleted

## 2013-09-08 MED ORDER — GABAPENTIN 300 MG PO CAPS: 600.0000 mg | ORAL_CAPSULE | Freq: Four times a day (QID) | ORAL | Status: DC

## 2013-09-12 ENCOUNTER — Encounter (HOSPITAL_COMMUNITY): Payer: Self-pay | Admitting: Emergency Medicine

## 2013-09-12 ENCOUNTER — Emergency Department (INDEPENDENT_AMBULATORY_CARE_PROVIDER_SITE_OTHER)
Admission: EM | Admit: 2013-09-12 | Discharge: 2013-09-12 | Disposition: A | Discharge status: Home / Self Care | Payer: Medicare Other | Source: Home / Self Care

## 2013-09-12 ENCOUNTER — Emergency Department (INDEPENDENT_AMBULATORY_CARE_PROVIDER_SITE_OTHER): Payer: Medicare Other

## 2013-09-12 DIAGNOSIS — M25569 Pain in unspecified knee: Secondary | ICD-10-CM

## 2013-09-12 DIAGNOSIS — M25561 Pain in right knee: Secondary | ICD-10-CM

## 2013-09-12 MED ORDER — CODEINE SULFATE 15 MG PO TABS
15.0000 mg | ORAL_TABLET | Freq: Four times a day (QID) | ORAL | Status: DC | PRN
Start: 2013-09-12 — End: 2013-12-01

## 2013-09-12 NOTE — ED Notes (Signed)
C/o right knee pain due to falling  States she was stepping over a baby gate when she fell down and hit knee on the hardwood floor.  Tylenol and tramadol was taking for pain Ice and heat pad was used as treatment. States she does have a electricity feeling radiates in the knee cap

## 2013-09-12 NOTE — Discharge Instructions (Signed)
Use aleve twice daily for the next 4-5 days to help with inflammation.  Use the knee immobilizer for the next 2-3 days to help with discomfort.  Follow up if symptoms worsen or fail to improve.     RICE: Routine Care for Injuries The routine care of many injuries includes Rest, Ice, Compression, and Elevation (RICE). HOME CARE INSTRUCTIONS  Rest is needed to allow your body to heal. Routine activities can usually be resumed when comfortable. Injured tendons and bones can take up to 6 weeks to heal. Tendons are the cord-like structures that attach muscle to bone.  Ice following an injury helps keep the swelling down and reduces pain.  Put ice in a plastic bag.  Place a towel between your skin and the bag.  Leave the ice on for 15-20 minutes, 03-04 times a day. Do this while awake, for the first 24 to 48 hours. After that, continue as directed by your caregiver.  Compression helps keep swelling down. It also gives support and helps with discomfort. If an elastic bandage has been applied, it should be removed and reapplied every 3 to 4 hours. It should not be applied tightly, but firmly enough to keep swelling down. Watch fingers or toes for swelling, bluish discoloration, coldness, numbness, or excessive pain. If any of these problems occur, remove the bandage and reapply loosely. Contact your caregiver if these problems continue.  Elevation helps reduce swelling and decreases pain. With extremities, such as the arms, hands, legs, and feet, the injured area should be placed near or above the level of the heart, if possible. SEEK IMMEDIATE MEDICAL CARE IF:  You have persistent pain and swelling.  You develop redness, numbness, or unexpected weakness.  Your symptoms are getting worse rather than improving after several days. These symptoms may indicate that further evaluation or further X-rays are needed. Sometimes, X-rays may not show a small broken bone (fracture) until 1 week or 10 days  later. Make a follow-up appointment with your caregiver. Ask when your X-ray results will be ready. Make sure you get your X-ray results. Document Released: 12/09/2000 Document Revised: 11/19/2011 Document Reviewed: 01/26/2011 [facility] Patient Information 2014 [place], [place].   Knee Immobilization You have been prescribed a knee immobilizer. This is used to support and protect an injured or painful knee. Knee immobilizers keep your knee from being used while it is healing. Some of the common immobilizers used include splints (air, plaster, fiberglass or aluminum) or casts. Wear your knee immobilizer as instructed and only remove it as instructed. If the immobilizer is used to protect broken bones, torn ligaments or torn cartilage, it may be 4 to 6 weeks before you are able to begin rehabilitating your knee, and it may be longer than that before you are able to return to athletic activity. HOME CARE INSTRUCTIONS   Use powder to control irritation from sweat and friction.  Adjust the immobilizer to be firm but not tight. Signs of an immobilizer that is too tight include:  Swelling.  Numbness.  Color change in your foot or ankle.  Increased pain.  While resting, raise your leg above the level of your heart. This reduces throbbing and helps healing. Prop it up with pillows.  Remove the immobilizer to bathe and sleep. Wear it other times until you see your doctor again. When your splint or cast is removed:   Stretching and strengthening are important in caring for and preventing knee injuries. If your knee begins to get sore while you  are conditioning, decrease or back off your activities until you no longer have discomfort. Then gradually resume your activities.  When strengthening your knee, increase your activities a little at a time so as not to develop injuries from over use.  Work out an exercise plan with your caregiver or physical therapist to get the best program for you. SEEK  IMMEDIATE MEDICAL CARE IF:   Your knee seems to be getting worse rather than better.  You have increasing pain or swelling in the knee, foot, or ankle.  You have problems caused by the knee immobilizer or it breaks or needs replacement.  You have increased swelling or redness or soreness (inflammation) in your knee.  Your leg becomes warm and more painful.  You develop an unexplained temperature over 102 F (38.9 C). MAKE SURE YOU:   Understand these instructions.  Will watch your condition.  Will get help right away if you are not doing well or get worse. Document Released: 08/27/2005 Document Revised: 11/19/2011 Document Reviewed: 02/12/2007 [facility] Patient Information 2014 [place].

## 2013-09-12 NOTE — ED Provider Notes (Signed)
CSN: [id number]     Arrival date & time 09/12/13  1047 History   None    Chief Complaint  Patient presents with  . Knee Pain   (Consider location/radiation/quality/duration/timing/severity/associated sxs/prior Treatment)  HPI  The patient presents today with right knee pain x4 days.   The patient states that she fell climbing over a baby gate onto the right knee and that has not significantly improved since incident.  Past Medical History  Diagnosis Date  . Foot fracture, right 07/2010    per x-ray 07/2010 - oblique comminuted fifth metatarsal fracture, followed by Dr. Lorelei Pont  . Depression   . Insomnia   . GERD (gastroesophageal reflux disease)   . Carpal tunnel syndrome   . Cervical spinal stenosis 2008    per x-ray findings 2008  . Pulmonary nodule, right     RUL nodule, 7 mm noted on Chest XRAY - 06/2008, per CT follow up in 2009 - No suspicious pulmonary nodules or masse noted  . Cervicalgia   . Pain in limb   . Lumbago   . Chronic pain syndrome   . Cervical syndrome   . Primary localized osteoarthrosis, pelvic region and thigh   . Lesion of ulnar nerve   . Anxiety   . Osteoporosis   . Unexplained weight loss   . Visual disturbance   . Cough   . Arthritis pain   . Bruises easily   . Wound disruption, post-op, skin   . PONV (postoperative nausea and vomiting)     shortness of breath   . Hepatitis     hx of Hep C    Past Surgical History  Procedure Laterality Date  . Abdominal hysterectomy    . Finger amputated      right ring  . Drain in left ring finger      cat bite  . Broken clavical    . Cholecystectomy    . Hemorrhoid surgery  11/25/2006  . Cholecystectomy  10/30/2011    Procedure: LAPAROSCOPIC CHOLECYSTECTOMY WITH INTRAOPERATIVE CHOLANGIOGRAM;  Surgeon: Imogene Burn. Georgette Dover, MD;  Location: [facility];  Service: General;  Laterality: N/A;   Family History  Problem Relation Age of Onset  . Stroke Neg Hx   . Stomach cancer Neg Hx   . Cancer Father    colon/prostate  . Colon cancer Father 33    deceased at age 83 from colon cancer  . Cancer Paternal Grandmother     ovarian   History  Substance Use Topics  . Smoking status: Former Smoker -- 0.10 packs/day for 20 years    Types: Cigarettes    Quit date: 01/08/2011  . Smokeless tobacco: Never Used     Comment: Wants to tr Chantix/ 05/07/12 down to about 1-2 cig per day  . Alcohol Use: No   OB History   Grav Para Term Preterm Abortions TAB SAB Ect Mult Living                 Review of Systems  Constitutional: Negative.   HENT: Negative.   Eyes: Negative.   Respiratory: Negative.   Cardiovascular: Negative.  Negative for leg swelling.       Patient denies history of cardiovascular optimize, however numerous areas of what appear to be venous stasis ulcers present on bilateral lower extremities.  And states she plans to follow up with the vascular surgeon to evaluate.  Gastrointestinal: Negative.   Endocrine: Negative.   Genitourinary: Negative.   Musculoskeletal:  Reports pain in discomfort with anything touching her right knee.  Skin: Negative.   Allergic/Immunologic: Negative.   Neurological: Negative.   Hematological: Negative.   Psychiatric/Behavioral: Negative.     Allergies  Morphine and related; Prozac; and Tylenol  Home Medications   Current Outpatient Rx  Name  Route  Sig  Dispense  Refill  . albuterol (PROVENTIL HFA;VENTOLIN HFA) 108 (90 BASE) MCG/ACT inhaler   Inhalation   Inhale 1 puff into the lungs every 4 (four) hours as needed. Wheezing   1 Inhaler   5   . amLODipine (NORVASC) 5 MG tablet   Oral   Take 1 tablet (5 mg total) by mouth daily.   30 tablet   11   . Calcium Carb-Cholecalciferol [phone] MG-UNIT TABS   Oral   Take 1 tablet by mouth daily.   30 tablet   11   . citalopram (CELEXA) 40 MG tablet   Oral   Take 1 tablet (40 mg total) by mouth daily.   30 tablet   11   . codeine 15 MG tablet   Oral   Take 1 tablet (15 mg  total) by mouth every 6 (six) hours as needed.   15 tablet   0   . EXPIRED: DULoxetine (CYMBALTA) 20 MG capsule   Oral   Take 1 capsule (20 mg total) by mouth 2 (two) times daily.   60 capsule   3   . estrogens, conjugated, (PREMARIN) 0.3 MG tablet   Oral   Take 1 tablet (0.3 mg total) by mouth daily. Take daily for 21 days then do not take for 7 days.   30 tablet   5   . gabapentin (NEURONTIN) 300 MG capsule   Oral   Take 2 capsules (600 mg total) by mouth 4 (four) times daily.   240 capsule   3   . guaiFENesin (MUCINEX) 600 MG 12 hr tablet   Oral   Take 2 tablets (1,200 mg total) by mouth 2 (two) times daily.   20 tablet   0   . ibandronate (BONIVA) 3 MG/3ML SOLN injection   Intravenous   Inject 3 mLs (3 mg total) into the vein every 3 (three) months.   3.6 mL   2   . meloxicam (MOBIC) 7.5 MG tablet   Oral   Take 2 tablets (15 mg total) by mouth daily.   60 tablet   0   . omeprazole (PRILOSEC) 40 MG capsule   Oral   Take 1 capsule (40 mg total) by mouth daily.   30 capsule   6   . traZODone (DESYREL) 25 mg TABS      Take one quarter of a tab for 12.5mg  at bed time.   30 tablet   5    BP 128/83  Pulse 74  Temp(Src) 97.9 F (36.6 C) (Oral)  Resp 18  SpO2 100%  LMP 10/23/2002  Physical Exam  Nursing note and vitals reviewed. Constitutional: She is oriented to person, place, and time. She appears well-developed and well-nourished. No distress.  HENT:  Head: Normocephalic and atraumatic.  Cardiovascular: Normal rate, regular rhythm, normal heart sounds and intact distal pulses.  Exam reveals no gallop and no friction rub.   No murmur heard. Pulmonary/Chest: Effort normal and breath sounds normal. No respiratory distress. She has no wheezes. She has no rales. She exhibits no tenderness.  Musculoskeletal: Normal range of motion. She exhibits tenderness. She exhibits no edema.  No evidence of erythema  or or warmth to the joints. No swelling, tenderness  or effusion present.  Range of motion intact. Negative McMurray and Drawer test.  Neurological: She is alert and oriented to person, place, and time. She displays normal reflexes. She exhibits normal muscle tone.  Skin: Skin is warm and dry. She is not diaphoretic.  Mild abrasion approximately one and a half centimeters in diameter noted on the anterior surface of right knee.     ED Course  Procedures (including critical care time) Labs Review Labs Reviewed - No data to display Imaging Review Dg Knee 4 Views W/patella Right  09/12/2013   CLINICAL DATA:  Fall and anterior knee pain.  EXAM: RIGHT KNEE - COMPLETE 4+ VIEW  COMPARISON:  None.  FINDINGS: The knee is located without fracture. Mild degenerative changes in the patellofemoral compartment. No significant joint effusion. Normal alignment.  IMPRESSION: No acute bone abnormality to the right knee.   Electronically Signed   By: Markus Daft M.D.   On: 09/12/2013 13:26   Patient placed in right knee immobilizer for 2-3 days.  MDM   1. Knee pain, acute, right    Meds ordered this encounter  Medications  . codeine 15 MG tablet    Sig: Take 1 tablet (15 mg total) by mouth every 6 (six) hours as needed.    Dispense:  15 tablet    Refill:  0   Plan of care discussed with Dr. Juventino Slovak.  Patient to follow up with private orthopedist if no improvement or worsening of symptoms over the next 3-4 days.    Jacqualyn Posey, NP 09/12/13 1559

## 2013-09-15 ENCOUNTER — Encounter: Payer: Self-pay | Admitting: Cardiovascular Disease

## 2013-09-15 ENCOUNTER — Ambulatory Visit (INDEPENDENT_AMBULATORY_CARE_PROVIDER_SITE_OTHER): Payer: Medicare Other | Admitting: Cardiovascular Disease

## 2013-09-15 ENCOUNTER — Other Ambulatory Visit: Payer: Self-pay | Admitting: Dermatology

## 2013-09-15 DIAGNOSIS — R21 Rash and other nonspecific skin eruption: Secondary | ICD-10-CM

## 2013-09-15 DIAGNOSIS — Z8249 Family history of ischemic heart disease and other diseases of the circulatory system: Secondary | ICD-10-CM

## 2013-09-15 DIAGNOSIS — M79609 Pain in unspecified limb: Secondary | ICD-10-CM

## 2013-09-15 NOTE — ED Provider Notes (Signed)
Medical screening examination/treatment/procedure(s) were performed by resident physician or non-physician practitioner and as supervising physician I was immediately available for consultation/collaboration.   Shannan Slinker DOUGLAS MD.   Georgiana Spillane D Takiya Belmares, MD 09/15/13 0849 

## 2013-09-15 NOTE — Patient Instructions (Addendum)
You have been referred to Dermatology for rash and possible skin biopsy--[facility] [phone])  You have been referred to Electrophysiology for palpitations and prolonged QT.  Your physician has requested that you have an ankle brachial index (ABI). During this test an ultrasound and blood pressure cuff are used to evaluate the arteries that supply the arms and legs with blood. Allow thirty minutes for this exam. There are no restrictions or special instructions.  Your physician recommends that you continue on your current medications as directed. Please refer to the Current Medication list given to you today.  Your physician recommends that you schedule a follow-up appointment as needed with Dr Fletcher Anon.

## 2013-09-17 ENCOUNTER — Encounter: Payer: Self-pay | Admitting: Cardiology

## 2013-09-17 ENCOUNTER — Ambulatory Visit (HOSPITAL_COMMUNITY): Payer: Medicare Other | Attending: Cardiovascular Disease

## 2013-09-17 DIAGNOSIS — I739 Peripheral vascular disease, unspecified: Secondary | ICD-10-CM | POA: Insufficient documentation

## 2013-09-17 DIAGNOSIS — E785 Hyperlipidemia, unspecified: Secondary | ICD-10-CM | POA: Insufficient documentation

## 2013-09-17 DIAGNOSIS — F17200 Nicotine dependence, unspecified, uncomplicated: Secondary | ICD-10-CM | POA: Insufficient documentation

## 2013-09-17 DIAGNOSIS — M79609 Pain in unspecified limb: Secondary | ICD-10-CM

## 2013-09-17 DIAGNOSIS — I10 Essential (primary) hypertension: Secondary | ICD-10-CM | POA: Insufficient documentation

## 2013-09-17 DIAGNOSIS — L98499 Non-pressure chronic ulcer of skin of other sites with unspecified severity: Principal | ICD-10-CM

## 2013-09-18 ENCOUNTER — Other Ambulatory Visit (HOSPITAL_COMMUNITY): Payer: Self-pay | Admitting: *Deleted

## 2013-09-18 ENCOUNTER — Encounter: Payer: Self-pay | Admitting: Cardiovascular Disease

## 2013-09-18 ENCOUNTER — Ambulatory Visit (INDEPENDENT_AMBULATORY_CARE_PROVIDER_SITE_OTHER): Payer: Medicare Other | Admitting: Internal Medicine

## 2013-09-18 VITALS — BP 110/70 | HR 63 | Ht 64.0 in | Wt 119.0 lb

## 2013-09-18 DIAGNOSIS — R079 Chest pain, unspecified: Secondary | ICD-10-CM | POA: Insufficient documentation

## 2013-09-18 DIAGNOSIS — R21 Rash and other nonspecific skin eruption: Secondary | ICD-10-CM | POA: Insufficient documentation

## 2013-09-18 DIAGNOSIS — M79606 Pain in leg, unspecified: Secondary | ICD-10-CM | POA: Insufficient documentation

## 2013-09-18 DIAGNOSIS — Z8249 Family history of ischemic heart disease and other diseases of the circulatory system: Secondary | ICD-10-CM

## 2013-09-18 DIAGNOSIS — R002 Palpitations: Secondary | ICD-10-CM

## 2013-09-18 NOTE — Progress Notes (Signed)
HPI  This is a 58 year old female who is self-referred for evaluation of lower extremity rash or ulceration. She was told in the ED that these might be venous stasis ulcers. She has history of tobacco use, hypertension, anxiety and hepatitis C. No previous cardiac history. She noted lower extremity rash and ulceration below the knee bilaterally which started more than a year ago. This is itchy and mildly painful. She has seen multiple physicians for this with no official diagnosis. She does complain of bilateral leg pain and aching mostly at rest. She denies chest pain or significant dyspnea. She does complain of frequent palpitations. She also describes family history of prolonged QT syndrome.   Allergies  Allergen Reactions  . Morphine And Related Shortness Of Breath and Itching  . Prozac [Fluoxetine Hcl]     depression  . Tylenol [Acetaminophen]      Current Outpatient Prescriptions on File Prior to Visit  Medication Sig Dispense Refill  . albuterol (PROVENTIL HFA;VENTOLIN HFA) 108 (90 BASE) MCG/ACT inhaler Inhale 1 puff into the lungs every 4 (four) hours as needed. Wheezing  1 Inhaler  5  . amLODipine (NORVASC) 5 MG tablet Take 1 tablet (5 mg total) by mouth daily.  30 tablet  11  . Calcium Carb-Cholecalciferol [phone] MG-UNIT TABS Take 1 tablet by mouth daily.  30 tablet  11  . citalopram (CELEXA) 40 MG tablet Take 1 tablet (40 mg total) by mouth daily.  30 tablet  11  . codeine 15 MG tablet Take 1 tablet (15 mg total) by mouth every 6 (six) hours as needed.  15 tablet  0  . DULoxetine (CYMBALTA) 20 MG capsule Take 1 capsule (20 mg total) by mouth 2 (two) times daily.  60 capsule  3  . estrogens, conjugated, (PREMARIN) 0.3 MG tablet Take 1 tablet (0.3 mg total) by mouth daily. Take daily for 21 days then do not take for 7 days.  30 tablet  5  . gabapentin (NEURONTIN) 300 MG capsule Take 2 capsules (600 mg total) by mouth 4 (four) times daily.  240 capsule  3  . ibandronate (BONIVA)  3 MG/3ML SOLN injection Inject 3 mLs (3 mg total) into the vein every 3 (three) months.  3.6 mL  2  . omeprazole (PRILOSEC) 40 MG capsule Take 1 capsule (40 mg total) by mouth daily.  30 capsule  6  . traZODone (DESYREL) 25 mg TABS Take one quarter of a tab for 12.5mg  at bed time.  30 tablet  5   No current facility-administered medications on file prior to visit.     Past Medical History  Diagnosis Date  . Foot fracture, right 07/2010    per x-ray 07/2010 - oblique comminuted fifth metatarsal fracture, followed by Dr. Lorelei Pont  . Depression   . Insomnia   . GERD (gastroesophageal reflux disease)   . Carpal tunnel syndrome   . Cervical spinal stenosis 2008    per x-ray findings 2008  . Pulmonary nodule, right     RUL nodule, 7 mm noted on Chest XRAY - 06/2008, per CT follow up in 2009 - No suspicious pulmonary nodules or masse noted  . Cervicalgia   . Pain in limb   . Lumbago   . Chronic pain syndrome   . Cervical syndrome   . Primary localized osteoarthrosis, pelvic region and thigh   . Lesion of ulnar nerve   . Anxiety   . Osteoporosis   . Unexplained weight loss   . Visual  disturbance   . Cough   . Arthritis pain   . Bruises easily   . Wound disruption, post-op, skin   . PONV (postoperative nausea and vomiting)     shortness of breath   . Hepatitis     hx of Hep C      Past Surgical History  Procedure Laterality Date  . Abdominal hysterectomy    . Finger amputated      right ring  . Drain in left ring finger      cat bite  . Broken clavical    . Cholecystectomy    . Hemorrhoid surgery  11/25/2006  . Cholecystectomy  10/30/2011    Procedure: LAPAROSCOPIC CHOLECYSTECTOMY WITH INTRAOPERATIVE CHOLANGIOGRAM;  Surgeon: Imogene Burn. Georgette Dover, MD;  Location: [facility];  Service: General;  Laterality: N/A;     Family History  Problem Relation Age of Onset  . Stroke Neg Hx   . Stomach cancer Neg Hx   . Cancer Father     colon/prostate  . Colon cancer Father 79     deceased at age 75 from colon cancer  . Cancer Paternal Grandmother     ovarian     History   Social History  . Marital Status: Single    Spouse Name: N/A    Number of Children: N/A  . Years of Education: N/A   Occupational History  . Not on file.   Social History Main Topics  . Smoking status: Former Smoker -- 0.10 packs/day for 20 years    Types: Cigarettes    Quit date: 01/08/2011  . Smokeless tobacco: Never Used     Comment: Wants to tr Chantix/ 05/07/12 down to about 1-2 cig per day  . Alcohol Use: No  . Drug Use: No  . Sexual Activity: No   Other Topics Concern  . Not on file   Social History Narrative   Financial assistance approved for 100% discount at [facility] and has [facility] card, also given [place] card.  per Bonna Gains 12/09/2009     ROS A 10 point review of system was performed. It is negative other than that mentioned in the history of present illness.   PHYSICAL EXAM   BP 150/90  Pulse 77  Ht 5\' 4"  (1.626 m)  Wt 119 lb (53.978 kg)  BMI 20.42 kg/m2  LMP 10/23/2002 Constitutional: She is oriented to person, place, and time. She appears well-developed and well-nourished. No distress.  HENT: No nasal discharge.  Head: Normocephalic and atraumatic.  Eyes: Pupils are equal and round. No discharge.  Neck: Normal range of motion. Neck supple. No JVD present. No thyromegaly present.  Cardiovascular: Normal rate, regular rhythm, normal heart sounds. Exam reveals no gallop and no friction rub. No murmur heard.  Pulmonary/Chest: Effort normal and breath sounds normal. No stridor. No respiratory distress. She has no wheezes. She has no rales. She exhibits no tenderness.  Abdominal: Soft. Bowel sounds are normal. She exhibits no distension. There is no tenderness. There is no rebound and no guarding.  Musculoskeletal: Normal range of motion. She exhibits no edema and no tenderness.  Neurological: She is alert and oriented to person, place, and time. Coordination  normal.  Skin: Skin is warm and dry. There is a papular rash with small ulceration noted on the anterior shin area. She is not diaphoretic. No erythema. No pallor.  Psychiatric: She has a normal mood and affect. Her behavior is normal. Judgment and thought content normal.  Vascular: Distal pulses are  palpable.   EKG: Normal sinus rhythm with prolonged QT. QT interval is 486 ms   ASSESSMENT AND PLAN

## 2013-09-18 NOTE — Assessment & Plan Note (Signed)
Had EKG today showed mildly prolonged QT this could be due to treatment with citalopram and trazodone. However, given her complaints of palpitations and family history, I will refer her to EP to see if any genetic evaluation is warranted.

## 2013-09-18 NOTE — Patient Instructions (Signed)
Your physician recommends that you continue on your current medications as directed. Please refer to the Current Medication list given to you today.  Your physician has requested that you have a lexiscan myoview. For further information please visit [email or web address]. Please follow instruction sheet, as given.  Follow up as needed  

## 2013-09-18 NOTE — Assessment & Plan Note (Signed)
She has prolonged history of tobacco use. Thus, I will request an ABI for evaluation. Her symptoms are atypical for claudication and she does have palpable pulses. Thus, the chance of significant obstructive disease is low

## 2013-09-18 NOTE — Assessment & Plan Note (Signed)
Long history of nonspecific skin eruption in the anterior shin area bilaterally below the knee. The distribution of this is not consistent with venous or arterial ulceration. The appearance is suggestive of an inflammatory process. Given her history of hepatitis C, the differential diagnosis is wide including erythema nodosum among others. Thus, I recommend referral to dermatology for skin biopsy and definite treatment.

## 2013-09-18 NOTE — Addendum Note (Signed)
Addended by: Stanton Kidney on: 09/18/2013 10:43 AM   Modules accepted: Orders

## 2013-09-18 NOTE — Progress Notes (Signed)
ELECTROPHYSIOLOGY CONSULT NOTE  Patient ID: Amanda Bennett, MRN: [id number], DOB/AGE: [birth year] 58 y.o. Admit date: (Not on file) Date of Consult: 09/18/2013  Primary Physician: Vivi Barrack, MD Primary Cardiologist: MA  Chief Complaint: ? Long QT   HPI Amanda Bennett is a 58 y.o. female  Referred for evaluation of possible LONG QT syndrome  She is part of a kindred taht is gene positive  Her relationship to the proband is not entirely clear, but i think she is an aunt  She has no hx of syncope, but does have palps which are brief and abrupt No fam hx of syncope or sudden death  Exertional cp and DOE with HX OF CLAUDICATION  She is still smoking    Past Medical History  Diagnosis Date  . Foot fracture, right 07/2010    per x-ray 07/2010 - oblique comminuted fifth metatarsal fracture, followed by Dr. Patsy Lager  . Depression   . Insomnia   . GERD (gastroesophageal reflux disease)   . Carpal tunnel syndrome   . Cervical spinal stenosis 2008    per x-ray findings 2008  . Pulmonary nodule, right     RUL nodule, 7 mm noted on Chest XRAY - 06/2008, per CT follow up in 2009 - No suspicious pulmonary nodules or masse noted  . Cervicalgia   . Pain in limb   . Lumbago   . Chronic pain syndrome   . Cervical syndrome   . Primary localized osteoarthrosis, pelvic region and thigh   . Lesion of ulnar nerve   . Anxiety   . Osteoporosis   . Unexplained weight loss   . Visual disturbance   . Cough   . Arthritis pain   . Bruises easily   . Wound disruption, post-op, skin   . PONV (postoperative nausea and vomiting)     shortness of breath   . Hepatitis     hx of Hep C       Surgical History:  Past Surgical History  Procedure Laterality Date  . Abdominal hysterectomy    . Finger amputated      right ring  . Drain in left ring finger      cat bite  . Broken clavical    . Cholecystectomy    . Hemorrhoid surgery  11/25/2006  . Cholecystectomy  10/30/2011    Procedure:  LAPAROSCOPIC CHOLECYSTECTOMY WITH INTRAOPERATIVE CHOLANGIOGRAM;  Surgeon: Wilmon Arms. Corliss Skains, MD;  Location: [facility];  Service: General;  Laterality: N/A;     Home Meds: Prior to Admission medications   Medication Sig Start Date End Date Taking? Authorizing Provider  albuterol (PROVENTIL HFA;VENTOLIN HFA) 108 (90 BASE) MCG/ACT inhaler Inhale 1 puff into the lungs every 4 (four) hours as needed. Wheezing 02/18/13  Yes Bronson Curb, MD  amLODipine (NORVASC) 5 MG tablet Take 1 tablet (5 mg total) by mouth daily. 06/18/13 06/18/14 Yes Linward Headland, MD  Calcium Carb-Cholecalciferol [phone] MG-UNIT TABS Take 1 tablet by mouth daily. 10/02/11  Yes Mathis Dad, MD  citalopram (CELEXA) 40 MG tablet Take 1 tablet (40 mg total) by mouth daily. 06/18/13  Yes Linward Headland, MD  codeine 15 MG tablet Take 1 tablet (15 mg total) by mouth every 6 (six) hours as needed. 09/12/13  Yes Weber Cooks, NP  estrogens, conjugated, (PREMARIN) 0.3 MG tablet Take 1 tablet (0.3 mg total) by mouth daily. Take daily for 21 days then do not take for 7 days. 03/06/13 03/06/14 Yes Burns Spain,  MD  gabapentin (NEURONTIN) 300 MG capsule Take 2 capsules (600 mg total) by mouth 4 (four) times daily. 09/08/13 09/08/14 Yes Lesly Dukes, MD  ibandronate (BONIVA) 3 MG/3ML SOLN injection Inject 3 mLs (3 mg total) into the vein every 3 (three) months. 02/18/13  Yes Tonia Brooms, MD  omeprazole (PRILOSEC) 40 MG capsule Take 1 capsule (40 mg total) by mouth daily. 08/24/13 08/24/14 Yes Lesly Dukes, MD  traZODone (DESYREL) 25 mg TABS Take one quarter of a tab for 12.5mg  at bed time. 02/18/13 02/18/14 Yes Tonia Brooms, MD  DULoxetine (CYMBALTA) 20 MG capsule Take 1 capsule (20 mg total) by mouth 2 (two) times daily. 12/25/11 09/15/13  Ansel Bong, MD      Allergies:  Allergies  Allergen Reactions  . Morphine And Related Shortness Of Breath and Itching  . Prozac [Fluoxetine Hcl]     depression  . Tylenol [Acetaminophen]     History    Social History  . Marital Status: Single    Spouse Name: N/A    Number of Children: N/A  . Years of Education: N/A   Occupational History  . Not on file.   Social History Main Topics  . Smoking status: Former Smoker -- 0.10 packs/day for 20 years    Types: Cigarettes    Quit date: 01/08/2011  . Smokeless tobacco: Never Used     Comment: Wants to tr Chantix/ 05/07/12 down to about 1-2 cig per day  . Alcohol Use: No  . Drug Use: No  . Sexual Activity: No   Other Topics Concern  . Not on file   Social History Narrative   Financial assistance approved for 100% discount at [facility] and has [facility] card, [place].  per Bonna Gains 12/09/2009     Family History  Problem Relation Age of Onset  . Stroke Neg Hx   . Stomach cancer Neg Hx   . Cancer Father     colon/prostate  . Colon cancer Father 13    deceased at age 78 from colon cancer  . Cancer Paternal Grandmother     ovarian     ROS:  Please see the history of present illness.     All other systems reviewed and negative.    Physical Exam: Blood pressure 110/70, pulse 63, height 5\' 4"  (1.626 m), weight 119 lb (53.978 kg), last menstrual period 10/23/2002. General: Well developed, cachetic cigarette smelling   female in no acute distress. Head: Normocephalic, atraumatic, sclera non-icteric, no xanthomas, nares are without discharge. EENT: normal Lymph Nodes:  none Back: without scoliosis/kyphosis, no CVA tendersness Neck: Negative for carotid bruits. JVD not elevated. Lungs: Clear bilaterally to auscultation without wheezes, rales, or rhonchi. Breathing is unlabored. Heart: RRR with S1 S2. No murmur , rubs, or gallops appreciated. Abdomen: Soft, non-tender, non-distended with normoactive bowel sounds. No hepatomegaly. No rebound/guarding. No obvious abdominal masses. Msk:  Strength and tone appear normal for age. Extremities: No clubbing or cyanosis. No  edema.  Distal pedal pulses are 2+ and equal  bilaterally. Skin: Warm and Dry Neuro: Alert and oriented X 3. CN III-XII intact Grossly normal sensory and motor function . Psych:  Responds to questions appropriately with a normal affect.      Labs: Cardiac Enzymes No results found for this basename: CKTOTAL, CKMB, TROPONINI,  in the last 72 hours CBC Lab Results  Component Value Date   WBC 5.6 06/18/2013   HGB 13.6 06/18/2013   HCT 38.6 06/18/2013  MCV 96.3 06/18/2013   PLT 224 06/18/2013   PROTIME: No results found for this basename: LABPROT, INR,  in the last 72 hours Chemistry No results found for this basename: NA, K, CL, CO2, BUN, CREATININE, CALCIUM, LABALBU, PROT, BILITOT, ALKPHOS, ALT, AST, GLUCOSE,  in the last 168 hours Lipids Lab Results  Component Value Date   CHOL 255* 04/08/2012   HDL 43 04/08/2012   LDLCALC Comment:   Not calculated due to Triglyceride >400. Suggest ordering Direct LDL (Unit Code: [phone]).   Total Cholesterol/HDL Ratio:CHD Risk                        Coronary Heart Disease Risk Table                                        Men       Women          1/2 Average Risk              3.4        3.3              Average Risk              5.0        4.4           2X Average Risk              9.6        7.1           3X Average Risk             23.4       11.0 Use the calculated Patient Ratio above and the CHD Risk table  to determine the patient's CHD Risk. ATP III Classification (LDL):       < 100        mg/dL         Optimal      100 - 129     mg/dL         Near or Above Optimal      130 - 159     mg/dL         Borderline High      160 - 189     mg/dL         High       > 190        mg/dL         Very High   04/08/2012   TRIG 408* 04/08/2012   BNP No results found for this basename: probnp   Miscellaneous No results found for this basename: DDIMER    Radiology/Studies:  Dg Knee 4 Views W/patella Right  09/12/2013   CLINICAL DATA:  Fall and anterior knee pain.  EXAM: RIGHT KNEE - COMPLETE 4+ VIEW  COMPARISON:   None.  FINDINGS: The knee is located without fracture. Mild degenerative changes in the patellofemoral compartment. No significant joint effusion. Normal alignment.  IMPRESSION: No acute bone abnormality to the right knee.   Electronically Signed   By: Markus Daft M.D.   On: 09/12/2013 13:26    EKG:  NSR  63 15/10/46    Assessment and Plan:   Family history long QT syndrome  Exercise intolerance with chest pain and dyspnea  Claudication  Smoking  Depression  Hypertension  The patient presents as a member of the family with long QT syndrome. We do not have the available information as to her relationship to the proband who has been tested and deemed positive. These data were obtained at [facility]. Is not clear that genetic counseling in terms of the pedigree has been accomplished.  Her QT interval is within the normal range and may be mildly prolonged by her concurrent use of antidepressants. gene testing is appropriate  The patient also has symptoms of exercise intolerance and chest pain with claudication given her high pretest likelihood of coronary disease; she is also not well able to ambulate. We'll undertake Myoview scanning.  She and her daughter are both working on stopping smoking.   Virl Axe

## 2013-09-21 ENCOUNTER — Telehealth: Payer: Self-pay | Admitting: Cardiovascular Disease

## 2013-09-21 NOTE — Telephone Encounter (Signed)
Left message for pt to call.

## 2013-09-21 NOTE — Telephone Encounter (Signed)
Spoke with pt, aware ABI are normal.

## 2013-09-21 NOTE — Telephone Encounter (Signed)
Follow up ° ° ° ° °Returned a nurses call °

## 2013-09-21 NOTE — Telephone Encounter (Signed)
New message        Pt would like results from ABI done Jan 8.

## 2013-09-23 ENCOUNTER — Ambulatory Visit (HOSPITAL_COMMUNITY)
Admission: RE | Admit: 2013-09-23 | Discharge: 2013-09-23 | Disposition: A | Discharge status: Home / Self Care | Payer: Medicare Other | Source: Ambulatory Visit | Attending: Cardiovascular Disease | Admitting: Cardiovascular Disease

## 2013-09-23 DIAGNOSIS — R079 Chest pain, unspecified: Secondary | ICD-10-CM | POA: Insufficient documentation

## 2013-09-23 DIAGNOSIS — Z8249 Family history of ischemic heart disease and other diseases of the circulatory system: Secondary | ICD-10-CM

## 2013-09-23 DIAGNOSIS — R002 Palpitations: Secondary | ICD-10-CM

## 2013-09-23 MED ORDER — REGADENOSON 0.4 MG/5ML IV SOLN
0.4000 mg | Freq: Once | INTRAVENOUS | Status: AC
Start: 2013-09-23 — End: 2013-09-23
  Administered 2013-09-23: 0.4 mg via INTRAVENOUS

## 2013-09-23 MED ORDER — TECHNETIUM TC 99M SESTAMIBI GENERIC - CARDIOLITE
30.0000 | Freq: Once | INTRAVENOUS | Status: AC | PRN
  Administered 2013-09-23: 30 via INTRAVENOUS

## 2013-09-23 MED ORDER — TECHNETIUM TC 99M SESTAMIBI GENERIC - CARDIOLITE
10.0000 | Freq: Once | INTRAVENOUS | Status: AC | PRN
  Administered 2013-09-23: 10 via INTRAVENOUS

## 2013-09-23 MED ORDER — AMINOPHYLLINE 25 MG/ML IV SOLN
75.0000 mg | Freq: Once | INTRAVENOUS | Status: AC
  Administered 2013-09-23: 75 mg via INTRAVENOUS

## 2013-09-23 NOTE — Procedures (Addendum)
[facility] [address] [place] [address] [phone]  Cardiology Nuclear Med Study  Amanda Bennett is a 58 y.o. female     MRN : [id number]     DOB: August 31, 1956  Procedure Date: 09/23/2013  Nuclear Med Background Indication for Stress Test:  Evaluation for Ischemia History:  Asthma, COPD and Long QT Syndrome Cardiac Risk Factors: Claudication, Family History - CAD, Hypertension, Lipids and Smoker  Symptoms:  Chest Pain, DOE, Fatigue, Light-Headedness and Palpitations   Nuclear Pre-Procedure Caffeine/Decaff Intake:  8:00pm NPO After: 6:00am   IV Site: R Hand  IV 0.9% NS with Angio Cath:  22g  Chest Size (in):  n/a IV Started by: Azucena Cecil, RN  Height: 5\' 4"  (1.626 m)  Cup Size: C--Pt has bilateral breast implants.  BMI:  Body mass index is 20.42 kg/(m^2). Weight:  119 lb (53.978 kg)   Tech Comments:  n/a    Nuclear Med Study 1 or 2 day study: 1 day  Stress Test Type:  [place] Provider:  Virl Axe, MD   Resting Radionuclide: Technetium 40m Sestamibi  Resting Radionuclide Dose: 10.5 mCi   Stress Radionuclide:  Technetium 30m Sestamibi  Stress Radionuclide Dose: 31.5 mCi           Stress Protocol Rest HR: 59 Stress HR: 90  Rest BP: 140/80 Stress BP: 144/83  Exercise Time (min): n/a METS: n/a          Dose of Adenosine (mg):  n/a Dose of Lexiscan: 0.4 mg  Dose of Atropine (mg): n/a Dose of Dobutamine: n/a mcg/kg/min (at max HR)  Stress Test Technologist: Mellody Memos, CCT Nuclear Technologist: Otho Perl, CNMT   Rest Procedure:  Myocardial perfusion imaging was performed at rest 45 minutes following the intravenous administration of Technetium 20m Sestamibi. Stress Procedure:  The patient received IV Lexiscan 0.4 mg over 15-seconds.  Technetium 36m Sestamibi injected at 30-seconds.  Due to patient's shortness of breath and stomach pains, she was given IV Aminophylline 75 mg.  Symptoms were resolved during recovery. There were no significant changes with Lexiscan.  Quantitative spect images were obtained after a 45 minute delay.  Transient Ischemic Dilatation (Normal <1.22):  1.02 Lung/Heart Ratio (Normal <0.45):  0.28 QGS EDV:  74 ml QGS ESV:  23 ml LV Ejection Fraction: 69%  Signed by       Rest ECG: NSR - Normal EKG  Stress ECG: No significant change from baseline ECG  QPS Raw Data Images:  Normal; no motion artifact; normal heart/lung ratio. Stress Images:  Normal homogeneous uptake in all areas of the myocardium. Rest Images:  Normal homogeneous uptake in all areas of the myocardium. Subtraction (SDS):  Normal  Impression Exercise Capacity:  Lexiscan with no exercise. BP Response:  Normal blood pressure response. Clinical Symptoms:  No significant symptoms noted. ECG Impression:  No significant ST segment change suggestive of ischemia. Comparison with Prior Nuclear Study: No images to compare  Overall Impression:  Normal stress nuclear study.  LV Wall Motion:  NL LV Function, EF 69%; NL Wall Motion   KELLY,THOMAS A, MD  09/23/2013 1:32 PM

## 2013-11-24 ENCOUNTER — Telehealth: Payer: Self-pay | Admitting: Internal Medicine

## 2013-11-24 ENCOUNTER — Other Ambulatory Visit: Payer: Self-pay | Admitting: *Deleted

## 2013-11-24 DIAGNOSIS — N951 Menopausal and female climacteric states: Secondary | ICD-10-CM

## 2013-11-24 NOTE — Telephone Encounter (Signed)
Pt stated she had not heard back from Dr. Caryl Comes on her family history or the results of her stress test on 09/23/2013

## 2013-11-25 MED ORDER — ESTROGENS CONJUGATED 0.3 MG PO TABS: 0.3000 mg | ORAL_TABLET | Freq: Every day | ORAL | Status: DC

## 2013-11-25 NOTE — Telephone Encounter (Signed)
I have asked the front desk to schedule this patient in the clinic for routine follow up. I would like her to be evaluated to see whether she needs to be continued on estrogen replacement, been on >1 year and there are CVD risks associated with it. I have only provided 1 refill.  Lesly Dukes, MD  [email or web address] Pager # [phone] Office # [phone]

## 2013-12-01 ENCOUNTER — Encounter (HOSPITAL_COMMUNITY): Payer: Self-pay | Admitting: Emergency Medicine

## 2013-12-01 ENCOUNTER — Emergency Department (HOSPITAL_COMMUNITY)
Admission: EM | Admit: 2013-12-01 | Discharge: 2013-12-01 | Disposition: A | Discharge status: Home / Self Care | Payer: Medicare Other | Attending: Emergency Medicine | Admitting: Emergency Medicine

## 2013-12-01 ENCOUNTER — Emergency Department (HOSPITAL_COMMUNITY): Payer: Medicare Other

## 2013-12-01 DIAGNOSIS — M810 Age-related osteoporosis without current pathological fracture: Secondary | ICD-10-CM | POA: Insufficient documentation

## 2013-12-01 DIAGNOSIS — F329 Major depressive disorder, single episode, unspecified: Secondary | ICD-10-CM | POA: Insufficient documentation

## 2013-12-01 DIAGNOSIS — K219 Gastro-esophageal reflux disease without esophagitis: Secondary | ICD-10-CM | POA: Insufficient documentation

## 2013-12-01 DIAGNOSIS — G8929 Other chronic pain: Secondary | ICD-10-CM | POA: Insufficient documentation

## 2013-12-01 DIAGNOSIS — F411 Generalized anxiety disorder: Secondary | ICD-10-CM | POA: Insufficient documentation

## 2013-12-01 DIAGNOSIS — S2239XA Fracture of one rib, unspecified side, initial encounter for closed fracture: Secondary | ICD-10-CM | POA: Insufficient documentation

## 2013-12-01 DIAGNOSIS — Z87891 Personal history of nicotine dependence: Secondary | ICD-10-CM | POA: Insufficient documentation

## 2013-12-01 DIAGNOSIS — Z79899 Other long term (current) drug therapy: Secondary | ICD-10-CM | POA: Insufficient documentation

## 2013-12-01 DIAGNOSIS — Y929 Unspecified place or not applicable: Secondary | ICD-10-CM | POA: Insufficient documentation

## 2013-12-01 DIAGNOSIS — F3289 Other specified depressive episodes: Secondary | ICD-10-CM | POA: Insufficient documentation

## 2013-12-01 DIAGNOSIS — G894 Chronic pain syndrome: Secondary | ICD-10-CM | POA: Insufficient documentation

## 2013-12-01 DIAGNOSIS — M7040 Prepatellar bursitis, unspecified knee: Secondary | ICD-10-CM | POA: Insufficient documentation

## 2013-12-01 DIAGNOSIS — M129 Arthropathy, unspecified: Secondary | ICD-10-CM | POA: Insufficient documentation

## 2013-12-01 DIAGNOSIS — S199XXA Unspecified injury of neck, initial encounter: Secondary | ICD-10-CM | POA: Insufficient documentation

## 2013-12-01 DIAGNOSIS — G4700 Insomnia, unspecified: Secondary | ICD-10-CM | POA: Insufficient documentation

## 2013-12-01 DIAGNOSIS — S2231XA Fracture of one rib, right side, initial encounter for closed fracture: Secondary | ICD-10-CM

## 2013-12-01 DIAGNOSIS — W010XXA Fall on same level from slipping, tripping and stumbling without subsequent striking against object, initial encounter: Secondary | ICD-10-CM | POA: Insufficient documentation

## 2013-12-01 DIAGNOSIS — S0990XA Unspecified injury of head, initial encounter: Secondary | ICD-10-CM | POA: Insufficient documentation

## 2013-12-01 DIAGNOSIS — S0993XA Unspecified injury of face, initial encounter: Secondary | ICD-10-CM | POA: Insufficient documentation

## 2013-12-01 DIAGNOSIS — Y9389 Activity, other specified: Secondary | ICD-10-CM | POA: Insufficient documentation

## 2013-12-01 MED ORDER — OXYCODONE HCL 5 MG PO TABS
5.0000 mg | ORAL_TABLET | Freq: Once | ORAL | Status: AC
  Administered 2013-12-01: 5 mg via ORAL
  Filled 2013-12-01: qty 1

## 2013-12-01 MED ORDER — OXYCODONE HCL 5 MG PO TABS
5.0000 mg | ORAL_TABLET | ORAL | Status: DC | PRN
Start: 2013-12-01 — End: 2013-12-30

## 2013-12-01 NOTE — Discharge Instructions (Signed)
Take oxycodone for pain - Please be careful with this medication.  It can cause drowsiness.  Use caution while driving, operating machinery, drinking alcohol, or any other activities that may impair your physical or mental abilities.   Use incentive spirometer several times a day and take deep breathing  Use RICE method to help with knee symptoms - see below  Follow-up with orthopedics and your doctor  Return to the emergency department if you develop any changing/worsening condition, fever, coughing up blood, difficulty breathing, leg swelling, or any other concerns (please read additional information regarding your condition below)   Bursitis Bursitis is a swelling and soreness (inflammation) of a fluid-filled sac (bursa) that overlies and protects a joint. It can be caused by injury, overuse of the joint, arthritis or infection. The joints most likely to be affected are the elbows, shoulders, hips and knees. HOME CARE INSTRUCTIONS   Apply ice to the affected area for 15-20 minutes each hour while awake for 2 days. Put the ice in a plastic bag and place a towel between the bag of ice and your skin.  Rest the injured joint as much as possible, but continue to put the joint through a full range of motion, 4 times per day. (The shoulder joint especially becomes rapidly "frozen" if not used.) When the pain lessens, begin normal slow movements and usual activities.  Only take over-the-counter or prescription medicines for pain, discomfort or fever as directed by your caregiver.  Your caregiver may recommend draining the bursa and injecting medicine into the bursa. This may help the healing process.  Follow all instructions for follow-up with your caregiver. This includes any orthopedic referrals, physical therapy and rehabilitation. Any delay in obtaining necessary care could result in a delay or failure of the bursitis to heal and chronic pain. SEEK IMMEDIATE MEDICAL CARE IF:   Your pain  increases even during treatment.  You develop an oral temperature above 102 F (38.9 C) and have heat and inflammation over the involved bursa. MAKE SURE YOU:   Understand these instructions.  Will watch your condition.  Will get help right away if you are not doing well or get worse. Document Released: 08/24/2000 Document Revised: 11/19/2011 Document Reviewed: 07/29/2009 [facility] Patient Information 2014 [place], [place].  RICE: Routine Care for Injuries The routine care of many injuries includes Rest, Ice, Compression, and Elevation (RICE). HOME CARE INSTRUCTIONS  Rest is needed to allow your body to heal. Routine activities can usually be resumed when comfortable. Injured tendons and bones can take up to 6 weeks to heal. Tendons are the cord-like structures that attach muscle to bone.  Ice following an injury helps keep the swelling down and reduces pain.  Put ice in a plastic bag.  Place a towel between your skin and the bag.  Leave the ice on for 15-20 minutes, 03-04 times a day. Do this while awake, for the first 24 to 48 hours. After that, continue as directed by your caregiver.  Compression helps keep swelling down. It also gives support and helps with discomfort. If an elastic bandage has been applied, it should be removed and reapplied every 3 to 4 hours. It should not be applied tightly, but firmly enough to keep swelling down. Watch fingers or toes for swelling, bluish discoloration, coldness, numbness, or excessive pain. If any of these problems occur, remove the bandage and reapply loosely. Contact your caregiver if these problems continue.  Elevation helps reduce swelling and decreases pain. With extremities, such as  the arms, hands, legs, and feet, the injured area should be placed near or above the level of the heart, if possible. SEEK IMMEDIATE MEDICAL CARE IF:  You have persistent pain and swelling.  You develop redness, numbness, or unexpected weakness.  Your  symptoms are getting worse rather than improving after several days. These symptoms may indicate that further evaluation or further X-rays are needed. Sometimes, X-rays may not show a small broken bone (fracture) until 1 week or 10 days later. Make a follow-up appointment with your caregiver. Ask when your X-ray results will be ready. Make sure you get your X-ray results. Document Released: 12/09/2000 Document Revised: 11/19/2011 Document Reviewed: 01/26/2011 [facility] Patient Information 2014 [place], [place].  Rib Fracture A rib fracture is a break or crack in one of the bones of the ribs. The ribs are a group of long, curved bones that wrap around your chest and attach to your spine. They protect your lungs and other organs in the chest cavity. A broken or cracked rib is often painful, but most do not cause other problems. Most rib fractures heal on their own over time. However, rib fractures can be more serious if multiple ribs are broken or if broken ribs move out of place and push against other structures. CAUSES   A direct blow to the chest. For example, this could happen during contact sports, a car accident, or a fall against a hard object.  Repetitive movements with high force, such as pitching a baseball or having severe coughing spells. SYMPTOMS   Pain when you breathe in or cough.  Pain when someone presses on the injured area. DIAGNOSIS  Your caregiver will perform a physical exam. Various imaging tests may be ordered to confirm the diagnosis and to look for related injuries. These tests may include a chest X-ray, computed tomography (CT), magnetic resonance imaging (MRI), or a bone scan. TREATMENT  Rib fractures usually heal on their own in 1 3 months. The longer healing period is often associated with a continued cough or other aggravating activities. During the healing period, pain control is very important. Medication is usually given to control pain. Hospitalization or surgery  may be needed for more severe injuries, such as those in which multiple ribs are broken or the ribs have moved out of place.  HOME CARE INSTRUCTIONS   Avoid strenuous activity and any activities or movements that cause pain. Be careful during activities and avoid bumping the injured rib.  Gradually increase activity as directed by your caregiver.  Only take over-the-counter or prescription medications as directed by your caregiver. Do not take other medications without asking your caregiver first.  Apply ice to the injured area for the first 1 2 days after you have been treated or as directed by your caregiver. Applying ice helps to reduce inflammation and pain.  Put ice in a plastic bag.  Place a towel between your skin and the bag.   Leave the ice on for 15 20 minutes at a time, every 2 hours while you are awake.  Perform deep breathing as directed by your caregiver. This will help prevent pneumonia, which is a common complication of a broken rib. Your caregiver may instruct you to:  Take deep breaths several times a day.  Try to cough several times a day, holding a pillow against the injured area.  Use a device called an incentive spirometer to practice deep breathing several times a day.  Drink enough fluids to keep your urine clear  or pale yellow. This will help you avoid constipation.   Do not wear a rib belt or binder. These restrict breathing, which can lead to pneumonia.  SEEK IMMEDIATE MEDICAL CARE IF:   You have a fever.   You have difficulty breathing or shortness of breath.   You develop a continual cough, or you cough up thick or bloody sputum.  You feel sick to your stomach (nausea), throw up (vomit), or have abdominal pain.   You have worsening pain not controlled with medications.  MAKE SURE YOU:  Understand these instructions.  Will watch your condition.  Will get help right away if you are not doing well or get worse. Document Released:  08/27/2005 Document Revised: 04/29/2013 Document Reviewed: 10/29/2012 [facility] Patient Information 2014 [place], [place].   Emergency Department Resource Guide 1) Find a Doctor and Pay Out of Pocket Although you won't have to find out who is covered by your insurance plan, it is a good idea to ask around and get recommendations. You will then need to call the office and see if the doctor you have chosen will accept you as a new patient and what types of options they offer for patients who are self-pay. Some doctors offer discounts or will set up payment plans for their patients who do not have insurance, but you will need to ask so you aren't surprised when you get to your appointment.  2) Contact Your Local Health Department Not all health departments have doctors that can see patients for sick visits, but many do, so it is worth a call to see if yours does. If you don't know where your local health department is, you can check in your phone book. The CDC also has a tool to help you locate your state's health department, and many state websites also have listings of all of their local health departments.  3) Find a [facility] If your illness is not likely to be very severe or complicated, you may want to try a walk in clinic. These are popping up all over the country in pharmacies, drugstores, and shopping centers. They're usually staffed by nurse practitioners or physician assistants that have been trained to treat common illnesses and complaints. They're usually fairly quick and inexpensive. However, if you have serious medical issues or chronic medical problems, these are probably not your best option.  No Primary Care Doctor: - Call Health Connect at  [phone] - they can help you locate a primary care doctor that  accepts your insurance, provides certain services, etc. - Physician Referral Service- [phone]  Chronic Pain Problems: Organization         Address  Phone   Notes  [facility]  [phone] Patients need to be referred by their primary care doctor.   Medication Assistance: Organization         Address  Phone   Notes  [facility] Medication [facility] [address] [place], [place] [address] [phone] --Must be a resident of [facility] -- Must have NO insurance coverage whatsoever (no Medicaid/ Medicare, etc.) -- The pt. MUST have a primary care doctor that directs their care regularly and follows them in the community   [facility]ist  [phone]   [employer]  [phone]    Agencies that provide inexpensive medical care: Organization         Address  Phone   Notes  [place]  [phone]  [facility]    [phone]   [facility], [place] [address] [phone]   [place]. [address], [place] [phone]   Planned Parenthood    [phone]   [facility]    [phone]   [place] and [place] [address], [place] Phone:  [phone], Fax:  [phone] Hours of Operation:  9 am - 6 pm, M-F.  Also accepts Medicaid/Medicare and self-pay.  [facility] for [place] [address], [address], [place] Phone: [phone], Fax: [phone]. Hours of Operation:  8:30 am - 5:30 pm, M-F.  Also accepts Medicaid and self-pay.  [facility] [place] [address] [place] Phone: [phone]   [place], [place], [place] [phone], Ext. [phone] Mondays & Thursdays: 7-9 AM.  First 15 patients are seen on a first come, first serve basis.    [place] Providers:  Organization         Address  Phone   Notes  [facility] [address], [place] [phone] Also accepts self-pay patients.  [facility] [address], [place]  [phone]   [place], [address], [place] [phone]   [facility] [address] [place] [phone]   Lucianne Lei [address], [place]   [phone] Only accepts [place] Access [place] patients after they have their name applied to their card.   Self-Pay (no insurance) in [facility]:  Organization         Address  Phone   Notes  Sickle Cell Patients, [facility] [place] [phone]   [facility] [place] [phone]   [facility] [place], [place],  [phone]   [facility]/Dr. Osei-Bonsu  [address], [place] or [address], [place] [phone] Phone number for both [place] and [place] locations is the same.  [facility] [address] [place] [phone]   [facility] [address] [place] or [address] [phone]   [facility] [address], [place] [phone], phone; [phone], fax Sees patients 1st and 3rd Saturday of every month.  Must not qualify for public or private insurance (i.e. Medicaid, Medicare, [place] Health Choice, Veterans' Benefits)  Household income should be no more than 200% of the poverty level The clinic cannot treat you if you are pregnant or think you are pregnant  Sexually transmitted diseases are not treated at the clinic.    Dental Care: Organization         Address  Phone  Notes  [facility] [place] [phone] Accepts children up to age 14 who are enrolled in [place] or [place]; pregnant women with a Medicaid card; and children who have applied for  Medicaid or  Health Choice, but were declined, whose parents can pay a reduced fee at time of service.  [facility]  [address],  [place] [phone] Accepts children up to age 56 who are enrolled in Medicaid or [place] Health Choice; pregnant women with a Medicaid card; and children who have applied for Medicaid or [place] Health Choice, but were declined, whose parents can pay a reduced fee at time of service.  [facility]  [address], [place] [phone] Patients are seen by appointment only. Walk-ins are not accepted. [facility] will see patients 56 years of age and older. Monday - Tuesday (8am-5pm) Most Wednesdays (8:30-5pm) $30 per visit, cash only  [facility]  [address], [facility] [phone] Patients are seen by appointment only. Walk-ins are not accepted. [facility] will see patients 56 years of age and older. One Wednesday Evening (Monthly: Volunteer Based).  $30 per visit, cash only  [employer] [facility]  [phone] for adults; Children under age 364, call Graduate Pediatric Dentistry at [phone]. Children aged 634-14, please call [phone] to request a pediatric application.  Dental services are provided in all areas of dental care including fillings, crowns and bridges, complete and partial dentures, implants, gum treatment, root canals, and extractions. Preventive care is also provided. Treatment is provided to both adults and children. Patients are selected via a lottery and there is often a waiting list.   [facility] [address], [place]  [phone] [email or web address]   [facility] [address], [place], [place] [phone], Ext. [phone] Second and Fourth Thursday of each month, opens at 6:30 AM; Clinic ends at 9 AM.  Patients are seen on a first-come first-served basis, and a limited number  are seen during each clinic.   [facility]  [address], [place], [place] [phone]   Eligibility Requirements You must have lived in [place], [place], or [place] counties for at least the last three months.   You cannot be eligible for state or federal sponsored [place]healthcare insurance, including CIG[employer]eterans Administration, [place]icaid, or [employer]icare.   You generally cannot be eligible for healthcare insurance through your employer.    How to apply: Eligibility screenings are held every Tuesday and Wednesday afternoon from 1:00 pm until 4:00 pm. You do not need an appointment for the interview!  [facility] [address], [place], [place] [phone]   [facility]  [phone]   [facility]  [phone]   [facility]  [phone]    Behavioral Health Resources in the Community: Intensive Outpatient Programs Organization         Address  Phone  Notes  [facility] [address], [place], [place] [phone]   [facility] [address], [place], [place] [phone]   [facility] [address], [place], [place]  [phone]   [facility] [address],  [place], [place] [phone] or [phone]   Substance Abuse Resources Organization         Address  Phone  Notes  [facility] Drug Services  [phone]   [facility]  [phone]   [facility]  [phone]   [facility]  [phone]   Residential & Outpatient Substance Abuse Program  [phone]   Psychological Services Organization         Address  Phone  Notes  [facility]  [phone]- [phone]   [facility]  [phone]- [phone]  [facility] [address] [place] [phone] or [phone]    Mobile Crisis Teams Organization         Address  Phone  Notes  Therapeutic  Alternatives, Mobile Crisis Care Unit  [phone]   [facility]otherapeutic [facility]  [address] [place], [place] [phone]   Doristine Locks [address] [place] [phone]    Self-Help/Support Groups Organization         Address  Phone             Notes  [facility] [place] - variety of support groups  336- I7437963 Call for more information  Narcotics Anonymous (NA), [facility] [address], [employer] [place]  2 meetings at this location   Statistician         Address  Phone  Notes  [facility] [address],    [place]  [phone]   [facility]  [address], [place] [address], [place], [place] [phone]   [facility] [address] [place], [place] [phone] Admissions: 8am-3pm M-F  [facility] [address],    [place], [place] [phone]   [facility] [address] [place], [place], [place] [phone]   [facility] [address],  [place], [place] [phone]   [facility] - Intensive Outpatient [address], [place], [place] [phone]   [facility] ([facility] [address] [place].,  [place], [place] [phone] or [phone]   [facility] (RTS) [address], [place], [place] [phone] Accepts Medicaid  [facility] [place] [address],  [place] [phone] Substance Abuse/Addiction Treatment   [facility] Organization         Address  Phone  Notes  [facility]  [phone]   Angie Fava, PhD [address] [place], [place]   [phone] or [phone]   [facility]   [address] [place], [place] [phone]   [facility] [address] [place], [place] [phone] Insurance/Medicaid/sponsorship through [facility] and Families  [address]                                    [place], [place] [phone] Therapy/tele-psych/case  [facility] [address], [place] [phone]    Dr. Lolly Mustache  [phone]   [facility]  [employer] [facility] Dept. 1) [address] [place] 2) [address], [place] 3)  [address], [place] [phone]  [phone]   [facility] [place] Child Abuse Hotline 2185466588 or [phone] (After Hours)

## 2013-12-01 NOTE — ED Notes (Signed)
Reports stepping over a baby gate one week ago and falling, having left knee pain and swelling, pt also thinks she has right broken ribs due to pain. Airway intact, no acute distress noted at triage.

## 2013-12-01 NOTE — Telephone Encounter (Signed)
Advised normal stress test.  Dr. Caryl Comes requesting information on child seen at [facility], for long QT --- pt gave me her aunt's number to call to get name of child.

## 2013-12-01 NOTE — ED Provider Notes (Signed)
CSN: [id number]     Arrival date & time 12/01/13  [id number] History   First MD Initiated Contact with Patient 12/01/13 [id number]     Chief Complaint  Patient presents with  . Fall    HPI  Amanda Bennett is a 58 y.o. female with a PMH of cervical spinal stenosis, lumbago, chronic pain syndrome, OA, osteoporosis, hepatitis C, GERD, depression, and anxiety who presents to the ED for evaluation of a fall. History was provided by the patient. Patient states she was stepping over a baby gate one week ago, tripped and fell. She sustained an injury to the left knee and right ribs. States she hit the right side of her head, but denies any LOC. No headaches and vision changes. Patient states she has been applying lidocaine cream, Icy Hot, and taking Aleve without relief. She complains of pain to the right ribs which is worse with palpation, laying on her right side at night, and deep breathing. Denies hemoptysis, cough, SOB, and chest pain. Patient has a hx of multiple rib fractures in the past from getting kicked by her horse. States her symptoms today are similar to her rib fractures in the past. Patient also complains of left knee swelling and pain. Has been able to ambulate without difficulty. Has a hx of left knee trauma in the past with no surgeries. Has chronic neck, back and hip pain with no acute changes. No weakness, loss of sensation, numbness/tingling. Patient denies any fever, chills, dysuria, emesis, nausea, or other concerns/reactions. Cannot take Tylenol due to her liver. Has had oxycodone in the past with no concerns. Has nausea and emesis with Vicodin.     Past Medical History  Diagnosis Date  . Foot fracture, right 07/2010    per x-ray 07/2010 - oblique comminuted fifth metatarsal fracture, followed by Dr. Lorelei Pont  . Depression   . Insomnia   . GERD (gastroesophageal reflux disease)   . Carpal tunnel syndrome   . Cervical spinal stenosis 2008    per x-ray findings 2008  . Pulmonary nodule,  right     RUL nodule, 7 mm noted on Chest XRAY - 06/2008, per CT follow up in 2009 - No suspicious pulmonary nodules or masse noted  . Cervicalgia   . Pain in limb   . Lumbago   . Chronic pain syndrome   . Cervical syndrome   . Primary localized osteoarthrosis, pelvic region and thigh   . Lesion of ulnar nerve   . Anxiety   . Osteoporosis   . Unexplained weight loss   . Visual disturbance   . Cough   . Arthritis pain   . Bruises easily   . Wound disruption, post-op, skin   . PONV (postoperative nausea and vomiting)     shortness of breath   . Hepatitis     hx of Hep C    Past Surgical History  Procedure Laterality Date  . Abdominal hysterectomy    . Finger amputated      right ring  . Drain in left ring finger      cat bite  . Broken clavical    . Cholecystectomy    . Hemorrhoid surgery  11/25/2006  . Cholecystectomy  10/30/2011    Procedure: LAPAROSCOPIC CHOLECYSTECTOMY WITH INTRAOPERATIVE CHOLANGIOGRAM;  Surgeon: Imogene Burn. Georgette Dover, MD;  Location: [facility];  Service: General;  Laterality: N/A;   Family History  Problem Relation Age of Onset  . Stroke Neg Hx   . Stomach cancer Neg  Hx   . Cancer Father     colon/prostate  . Colon cancer Father 38    deceased at age 56 from colon cancer  . Cancer Paternal Grandmother     ovarian   History  Substance Use Topics  . Smoking status: Former Smoker -- 0.10 packs/day for 20 years    Types: Cigarettes    Quit date: 01/08/2011  . Smokeless tobacco: Never Used     Comment: Wants to tr Chantix/ 05/07/12 down to about 1-2 cig per day  . Alcohol Use: No   OB History   Grav Para Term Preterm Abortions TAB SAB Ect Mult Living                 Review of Systems  Constitutional: Negative for fever, chills, diaphoresis, activity change, appetite change and fatigue.  HENT: Negative for dental problem and facial swelling.   Eyes: Negative for photophobia and visual disturbance.  Respiratory: Negative for cough, shortness of breath  and wheezing.   Cardiovascular: Negative for chest pain and leg swelling.  Gastrointestinal: Negative for nausea, vomiting, abdominal pain, diarrhea and constipation.  Genitourinary: Negative for dysuria, difficulty urinating and pelvic pain.  Musculoskeletal: Positive for arthralgias (left knee), back pain (chronic), joint swelling and neck pain (chronic). Negative for gait problem and myalgias.  Skin: Negative for color change, rash and wound.  Neurological: Negative for dizziness, syncope, weakness, light-headedness and headaches.    Allergies  Morphine and related; Prozac; and Tylenol  Home Medications   Current Outpatient Rx  Name  Route  Sig  Dispense  Refill  . albuterol (PROVENTIL HFA;VENTOLIN HFA) 108 (90 BASE) MCG/ACT inhaler   Inhalation   Inhale 1 puff into the lungs every 4 (four) hours as needed. Wheezing   1 Inhaler   5   . amLODipine (NORVASC) 5 MG tablet   Oral   Take 1 tablet (5 mg total) by mouth daily.   30 tablet   11   . Calcium Carb-Cholecalciferol [phone] MG-UNIT TABS   Oral   Take 1 tablet by mouth daily.   30 tablet   11   . citalopram (CELEXA) 40 MG tablet   Oral   Take 1 tablet (40 mg total) by mouth daily.   30 tablet   11   . codeine 15 MG tablet   Oral   Take 1 tablet (15 mg total) by mouth every 6 (six) hours as needed.   15 tablet   0   . EXPIRED: DULoxetine (CYMBALTA) 20 MG capsule   Oral   Take 1 capsule (20 mg total) by mouth 2 (two) times daily.   60 capsule   3   . estrogens, conjugated, (PREMARIN) 0.3 MG tablet   Oral   Take 1 tablet (0.3 mg total) by mouth daily. Take daily for 21 days then do not take for 7 days.   30 tablet   1   . gabapentin (NEURONTIN) 300 MG capsule   Oral   Take 2 capsules (600 mg total) by mouth 4 (four) times daily.   240 capsule   3   . ibandronate (BONIVA) 3 MG/3ML SOLN injection   Intravenous   Inject 3 mLs (3 mg total) into the vein every 3 (three) months.   3.6 mL   2   .  omeprazole (PRILOSEC) 40 MG capsule   Oral   Take 1 capsule (40 mg total) by mouth daily.   30 capsule   6   .  traZODone (DESYREL) 25 mg TABS      Take one quarter of a tab for 12.5mg  at bed time.   30 tablet   5    BP 122/68  Pulse 65  Temp(Src) 97.1 F (36.2 C) (Oral)  Resp 17  Wt 120 lb (54.432 kg)  SpO2 100%  LMP 10/23/2002  Filed Vitals:   12/01/13 0821  BP: 122/68  Pulse: 65  Temp: 97.1 F (36.2 C)  TempSrc: Oral  Resp: 17  Weight: 120 lb (54.432 kg)  SpO2: 100%    Physical Exam  Nursing note and vitals reviewed. Constitutional: She is oriented to person, place, and time. She appears well-developed and well-nourished. No distress.  HENT:  Head: Normocephalic and atraumatic.  Right Ear: External ear normal.  Left Ear: External ear normal.  Nose: Nose normal.  Mouth/Throat: Oropharynx is clear and moist.  No tenderness to the scalp or face throughout. No palpable hematoma, step-offs, or lacerations throughout.  Tympanic membranes gray and translucent bilaterally.   Eyes: Conjunctivae and EOM are normal. Pupils are equal, round, and reactive to light. Right eye exhibits no discharge. Left eye exhibits no discharge.  Neck: Normal range of motion. Neck supple.  No cervical spinal or paraspinal tenderness to palpation throughout.  No limitations with neck ROM.    Cardiovascular: Normal rate, regular rhythm, normal heart sounds and intact distal pulses.  Exam reveals no gallop and no friction rub.   No murmur heard. Dorsalis pedis pulses present and equal bilaterally  Pulmonary/Chest: Effort normal and breath sounds normal. No respiratory distress. She has no wheezes. She has no rales. She exhibits tenderness.    Tenderness to palpation to the right mid-axillary ribs with no crepitus, ecchymosis, lacerations, erythema, or step-offs.  Abdominal: Soft. She exhibits no distension. There is no tenderness.  Musculoskeletal: Normal range of motion. She exhibits edema  and tenderness.  Tenderness to palpation to the left knee diffusely. Mild edema to the left knee. No limitations with ROM of the left knee. No hip, ankle, or foot tenderness throughout. Strength 5/5 in the upper and lower extremities bilaterally. No tenderness to palpation to the thoracic or lumbar spinous processes throughout.  No tenderness to palpation to the paraspinal muscles throughout.   Neurological: She is alert and oriented to person, place, and time.  GCS 15.  No focal neurological deficits.  CN 2-12 intact.  No pronator drift.   Skin: Skin is warm and dry. She is not diaphoretic.       ED Course  Procedures (including critical care time) Labs Review Labs Reviewed - No data to display Imaging Review No results found.   EKG Interpretation None      DG Knee Complete 4 Views Left (Final result)  Result time: 12/01/13 09:16:25    Final result by Rad Results In Interface (12/01/13 09:16:25)    Narrative:   CLINICAL DATA: Pain secondary to a fall 1 week ago.  EXAM: LEFT KNEE - COMPLETE 4+ VIEW  COMPARISON: None.  FINDINGS: There is no fracture or dislocation or joint effusion. There is prominent soft tissue swelling anterior to the patella and proximal patellar tendon consistent with posttraumatic prepatellar bursitis.  There is calcification in the proximal medial collateral ligament consistent with prior MCL injury.  IMPRESSION: Prepatellar bursitis. No acute osseous abnormality.   Electronically Signed By: Rozetta Nunnery M.D. On: 12/01/2013 09:16             DG Ribs Unilateral W/Chest Right (Final result)  Result time:  12/01/13 09:18:39    Final result by Rad Results In Interface (12/01/13 09:18:39)    Narrative:   CLINICAL DATA: The head anterior rib pain secondary to a fall 1 week ago.  EXAM: RIGHT RIBS AND CHEST - 3+ VIEW  COMPARISON: Chest x-ray dated 11/02/2012  FINDINGS: There is a slightly displaced fracture of the anterior  lateral aspect of the right ninth rib. There are multiple old right posterior lateral fractures of the upper right ribs.  There is no underlying pleural effusion, lung contusion, or pneumothorax.  Heart size and pulmonary vascularity are normal. Lungs are clear.  IMPRESSION: Acute slightly displaced fracture of the anterior lateral aspect of the right ninth rib.   Electronically Signed By: Rozetta Nunnery M.D. On: 12/01/2013 09:18         MDM   DIEM DICOCCO is a 58 y.o. female with a PMH of cervical spinal stenosis, lumbago, chronic pain syndrome, OA, osteoporosis, hepatitis C, GERD, depression, and anxiety who presents to the ED for evaluation of a fall.   Rechecks  10:00 AM = Patient dressed. Patient able to ambulate without difficulty or ataxia. Has a knee brace at home.    Patient evaluated after a fall 1 week ago. Complains of left knee pain which is likely due to traumatic pre-patellar bursitis. Patient neurovascularly intact. X-rays negative for fracture or malalignment. RICE method discussed with patient. Has knee brace at home. Patient also found to have a fracture of the right rib with slight displacement. X-rays personally reviewed. Incentive spirometer given in ED. No hypoxia, respiratory distress, or tachypnea. Lungs clear to auscultation. Encouraged to take deep breaths at home. Oxycodone (without Tylenol) provided for pain control as an OP. Encouraged to follow-up with PCP and orthopedic physician. Return precautions, discharge instructions, and follow-up was discussed with the patient before discharge.     New Prescriptions   OXYCODONE (OXY IR/ROXICODONE) 5 MG IMMEDIATE RELEASE TABLET    Take 1 tablet (5 mg total) by mouth every 4 (four) hours as needed for severe pain.     Final impressions: 1. Fracture of rib of right side   2. Prepatellar bursitis      Harold Hedge Amanda Friddle PA-C            Lucila Maine, PA-C 12/01/13 1043

## 2013-12-02 NOTE — ED Provider Notes (Signed)
Medical screening examination/treatment/procedure(s) were performed by non-physician practitioner and as supervising physician I was immediately available for consultation/collaboration.   EKG Interpretation None       Nat Christen, MD 12/02/13 1228

## 2013-12-03 NOTE — Telephone Encounter (Signed)
Spoke with pt earlier this morning about aunt's number - number given incorrect ([phone]). She gave me new number ([phone]), which is also incorrect when I called.  Left message for pt to call me back to discuss this, either get correct number or call her aunt herself and get information and then call me back with it.

## 2013-12-03 NOTE — Telephone Encounter (Signed)
Pt returned my call and gives me following information:  Pt's sister - Horton Chin 01/14/51 (pt of Caryl Comes) says that family medical hx in her chart for [place] to refer to. Manuela Schwartz says we can call her if need to [phone]).   Pt's niece name is Reatha Harps - she has been in hospital 3 times code blue r/t this heart condition   Wendy's dtr Kris Hartmann has been seen at [facility] for heart condition also.  Pt's dtr, 58yr old, has questionable abdominal aortic aneurysm   I will review this with Dr. Caryl Comes

## 2013-12-16 ENCOUNTER — Encounter: Payer: Medicare Other | Admitting: Internal Medicine

## 2013-12-25 ENCOUNTER — Encounter (HOSPITAL_COMMUNITY): Payer: Self-pay | Admitting: Emergency Medicine

## 2013-12-25 DIAGNOSIS — Z79899 Other long term (current) drug therapy: Secondary | ICD-10-CM | POA: Insufficient documentation

## 2013-12-25 DIAGNOSIS — B1920 Unspecified viral hepatitis C without hepatic coma: Secondary | ICD-10-CM | POA: Insufficient documentation

## 2013-12-25 DIAGNOSIS — F329 Major depressive disorder, single episode, unspecified: Secondary | ICD-10-CM | POA: Insufficient documentation

## 2013-12-25 DIAGNOSIS — G4700 Insomnia, unspecified: Secondary | ICD-10-CM | POA: Insufficient documentation

## 2013-12-25 DIAGNOSIS — J069 Acute upper respiratory infection, unspecified: Secondary | ICD-10-CM | POA: Insufficient documentation

## 2013-12-25 DIAGNOSIS — M129 Arthropathy, unspecified: Secondary | ICD-10-CM | POA: Insufficient documentation

## 2013-12-25 DIAGNOSIS — Z87891 Personal history of nicotine dependence: Secondary | ICD-10-CM | POA: Insufficient documentation

## 2013-12-25 DIAGNOSIS — Z8781 Personal history of (healed) traumatic fracture: Secondary | ICD-10-CM | POA: Insufficient documentation

## 2013-12-25 DIAGNOSIS — G894 Chronic pain syndrome: Secondary | ICD-10-CM | POA: Insufficient documentation

## 2013-12-25 DIAGNOSIS — K219 Gastro-esophageal reflux disease without esophagitis: Secondary | ICD-10-CM | POA: Insufficient documentation

## 2013-12-25 DIAGNOSIS — F3289 Other specified depressive episodes: Secondary | ICD-10-CM | POA: Insufficient documentation

## 2013-12-25 DIAGNOSIS — J40 Bronchitis, not specified as acute or chronic: Secondary | ICD-10-CM | POA: Insufficient documentation

## 2013-12-25 DIAGNOSIS — M1610 Unilateral primary osteoarthritis, unspecified hip: Secondary | ICD-10-CM | POA: Insufficient documentation

## 2013-12-25 DIAGNOSIS — Z7189 Other specified counseling: Secondary | ICD-10-CM | POA: Insufficient documentation

## 2013-12-25 DIAGNOSIS — M810 Age-related osteoporosis without current pathological fracture: Secondary | ICD-10-CM | POA: Insufficient documentation

## 2013-12-25 LAB — CBC WITH DIFFERENTIAL/PLATELET
Basophils Absolute: 0 10*3/uL (ref 0.0–0.1)
Basophils Relative: 0 % (ref 0–1)
Eosinophils Absolute: 0 10*3/uL (ref 0.0–0.7)
Eosinophils Relative: 0 % (ref 0–5)
HCT: 39.8 % (ref 36.0–46.0)
Hemoglobin: 14.2 g/dL (ref 12.0–15.0)
Lymphocytes Relative: 9 % — ABNORMAL LOW (ref 12–46)
Lymphs Abs: 1.3 10*3/uL (ref 0.7–4.0)
MCH: 33.4 pg (ref 26.0–34.0)
MCHC: 35.7 g/dL (ref 30.0–36.0)
MCV: 93.6 fL (ref 78.0–100.0)
Monocytes Absolute: 1 10*3/uL (ref 0.1–1.0)
Monocytes Relative: 7 % (ref 3–12)
Neutro Abs: 11.5 10*3/uL — ABNORMAL HIGH (ref 1.7–7.7)
Neutrophils Relative %: 84 % — ABNORMAL HIGH (ref 43–77)
Platelets: 251 10*3/uL (ref 150–400)
RBC: 4.25 MIL/uL (ref 3.87–5.11)
RDW: 13.6 % (ref 11.5–15.5)
WBC: 13.8 10*3/uL — ABNORMAL HIGH (ref 4.0–10.5)

## 2013-12-25 LAB — URINALYSIS, ROUTINE W REFLEX MICROSCOPIC
Bilirubin Urine: NEGATIVE
Glucose, UA: NEGATIVE mg/dL
Hgb urine dipstick: NEGATIVE
Ketones, ur: NEGATIVE mg/dL
Leukocytes, UA: NEGATIVE
Nitrite: NEGATIVE
Protein, ur: NEGATIVE mg/dL
Specific Gravity, Urine: 1.013 (ref 1.005–1.030)
Urobilinogen, UA: 1 mg/dL (ref 0.0–1.0)
pH: 7 (ref 5.0–8.0)

## 2013-12-25 NOTE — ED Notes (Signed)
The pt is c/o lt upper chest a high temp with rt groin pain for one week.  Nausea and dizziness coughing productive

## 2013-12-25 NOTE — ED Notes (Signed)
She has a prolonged q t

## 2013-12-26 ENCOUNTER — Emergency Department (HOSPITAL_COMMUNITY): Payer: Medicare Other

## 2013-12-26 ENCOUNTER — Emergency Department (HOSPITAL_COMMUNITY)
Admission: EM | Admit: 2013-12-26 | Discharge: 2013-12-26 | Disposition: A | Discharge status: Home / Self Care | Payer: Medicare Other | Attending: Emergency Medicine | Admitting: Emergency Medicine

## 2013-12-26 DIAGNOSIS — J209 Acute bronchitis, unspecified: Secondary | ICD-10-CM

## 2013-12-26 DIAGNOSIS — B1920 Unspecified viral hepatitis C without hepatic coma: Secondary | ICD-10-CM

## 2013-12-26 DIAGNOSIS — Z716 Tobacco abuse counseling: Secondary | ICD-10-CM

## 2013-12-26 DIAGNOSIS — J069 Acute upper respiratory infection, unspecified: Secondary | ICD-10-CM

## 2013-12-26 LAB — COMPREHENSIVE METABOLIC PANEL
ALT: 42 U/L — ABNORMAL HIGH (ref 0–35)
AST: 47 U/L — ABNORMAL HIGH (ref 0–37)
Albumin: 4.4 g/dL (ref 3.5–5.2)
Alkaline Phosphatase: 211 U/L — ABNORMAL HIGH (ref 39–117)
BUN: 9 mg/dL (ref 6–23)
CO2: 20 mEq/L (ref 19–32)
Calcium: 9.9 mg/dL (ref 8.4–10.5)
Chloride: 100 mEq/L (ref 96–112)
Creatinine, Ser: 0.64 mg/dL (ref 0.50–1.10)
GFR calc Af Amer: >90 mL/min (ref >90)
GFR calc non Af Amer: >90 mL/min (ref >90)
Glucose, Bld: 103 mg/dL — ABNORMAL HIGH (ref 70–99)
Potassium: 4 mEq/L (ref 3.7–5.3)
Sodium: 135 mEq/L — ABNORMAL LOW (ref 137–147)
Total Bilirubin: 0.6 mg/dL (ref 0.3–1.2)
Total Protein: 8.3 g/dL (ref 6.0–8.3)

## 2013-12-26 LAB — TROPONIN I: Troponin I: <0.3 ng/mL (ref <0.30)

## 2013-12-26 MED ORDER — HYDROCOD POLST-CHLORPHEN POLST 10-8 MG/5ML PO LQCR
5.0000 mL | Freq: Once | ORAL | Status: AC
  Administered 2013-12-26: 5 mL via ORAL
  Filled 2013-12-26: qty 5

## 2013-12-26 MED ORDER — BENZONATATE 100 MG PO CAPS: 100.0000 mg | ORAL_CAPSULE | Freq: Three times a day (TID) | ORAL | Status: DC

## 2013-12-26 MED ORDER — HYDROCOD POLST-CHLORPHEN POLST 10-8 MG/5ML PO LQCR: 5.0000 mL | Freq: Two times a day (BID) | ORAL | Status: DC | PRN

## 2013-12-26 NOTE — ED Provider Notes (Signed)
CSN: [id number]     Arrival date & time 12/25/13  2254 History   First MD Initiated Contact with Patient 12/26/13 0114     Chief Complaint  Patient presents with  . Chest Pain     (Consider location/radiation/quality/duration/timing/severity/associated sxs/prior Treatment) HPI  Patient is a 58 yo 1ppd smoker with hepatitis C, chronic neck and back pain.   She presents with complaints of productive cough x 1 week. She has developed left sided anterior chest pain with coughing but, only with coughing. No chest pain at rest. No SOB. Reports fever to 103F. Diminished po intake but no vomiting or diarrhea. No abdominal pain. Patient has not had hemotpysis. She notes occasional wheezing and describes a bronchospastic cough. She has an Albuterol HFA which she has been using.   The patient also points out a tender lymph node in the right inguinal region and says this has been giving her trouble. No recent infection. No leg swelling or pain.    Past Medical History  Diagnosis Date  . Foot fracture, right 07/2010    per x-ray 07/2010 - oblique comminuted fifth metatarsal fracture, followed by Dr. Lorelei Pont  . Depression   . Insomnia   . GERD (gastroesophageal reflux disease)   . Carpal tunnel syndrome   . Cervical spinal stenosis 2008    per x-ray findings 2008  . Pulmonary nodule, right     RUL nodule, 7 mm noted on Chest XRAY - 06/2008, per CT follow up in 2009 - No suspicious pulmonary nodules or masse noted  . Cervicalgia   . Pain in limb   . Lumbago   . Chronic pain syndrome   . Cervical syndrome   . Primary localized osteoarthrosis, pelvic region and thigh   . Lesion of ulnar nerve   . Anxiety   . Osteoporosis   . Unexplained weight loss   . Visual disturbance   . Cough   . Arthritis pain   . Bruises easily   . Wound disruption, post-op, skin   . PONV (postoperative nausea and vomiting)     shortness of breath   . Hepatitis     hx of Hep C    Past Surgical History   Procedure Laterality Date  . Abdominal hysterectomy    . Finger amputated      right ring  . Drain in left ring finger      cat bite  . Broken clavical    . Cholecystectomy    . Hemorrhoid surgery  11/25/2006  . Cholecystectomy  10/30/2011    Procedure: LAPAROSCOPIC CHOLECYSTECTOMY WITH INTRAOPERATIVE CHOLANGIOGRAM;  Surgeon: Imogene Burn. Georgette Dover, MD;  Location: [facility];  Service: General;  Laterality: N/A;   Family History  Problem Relation Age of Onset  . Stroke Neg Hx   . Stomach cancer Neg Hx   . Cancer Father     colon/prostate  . Colon cancer Father 42    deceased at age 57 from colon cancer  . Cancer Paternal Grandmother     ovarian   History  Substance Use Topics  . Smoking status: Former Smoker -- 0.10 packs/day for 20 years    Types: Cigarettes    Quit date: 01/08/2011  . Smokeless tobacco: Never Used     Comment: Wants to tr Chantix/ 05/07/12 down to about 1-2 cig per day  . Alcohol Use: No   OB History   Grav Para Term Preterm Abortions TAB SAB Ect Mult Living  Review of Systems Ten point review of symptoms performed and is negative with the exception of symptoms noted above.     Allergies  Morphine and related; Prozac; and Tylenol  Home Medications   Prior to Admission medications   Medication Sig Start Date End Date Taking? Authorizing Provider  albuterol (PROVENTIL HFA;VENTOLIN HFA) 108 (90 BASE) MCG/ACT inhaler Inhale 1 puff into the lungs every 4 (four) hours as needed. Wheezing 02/18/13   Tonia Brooms, MD  amLODipine (NORVASC) 5 MG tablet Take 1 tablet (5 mg total) by mouth daily. 06/18/13 06/18/14  Hester Mates, MD  citalopram (CELEXA) 40 MG tablet Take 1 tablet (40 mg total) by mouth daily. 06/18/13   Hester Mates, MD  DULoxetine (CYMBALTA) 20 MG capsule Take 1 capsule (20 mg total) by mouth 2 (two) times daily. 12/25/11 09/15/13  Ansel Bong, MD  estrogens, conjugated, (PREMARIN) 0.3 MG tablet Take 1 tablet (0.3 mg total) by mouth daily.  Take daily for 21 days then do not take for 7 days.  11/24/14  Lesly Dukes, MD  gabapentin (NEURONTIN) 300 MG capsule Take 2 capsules (600 mg total) by mouth 4 (four) times daily. 09/08/13 09/08/14  Lesly Dukes, MD  Ledipasvir-Sofosbuvir (HARVONI PO) Take 1 tablet by mouth daily.    Historical Provider, MD  lidocaine (LMX) 4 % cream Apply 1 application topically 3 (three) times daily as needed (pain). Apply to rib area and left knee    Historical Provider, MD  Menthol, Topical Analgesic, (ICY HOT EX) Apply 1 application topically 4 (four) times daily as needed (pain). Apply to rib area and left knee    Historical Provider, MD  omeprazole (PRILOSEC) 20 MG capsule Take 20 mg by mouth daily.    Historical Provider, MD  oxyCODONE (OXY IR/ROXICODONE) 5 MG immediate release tablet Take 1 tablet (5 mg total) by mouth every 4 (four) hours as needed for severe pain. 12/01/13   Lucila Maine, PA-C  traZODone (DESYREL) 25 mg TABS tablet Take 25 mg by mouth at bedtime.    Historical Provider, MD   BP 98/63  Pulse 95  Temp(Src) 100 F (37.8 C) (Oral)  Ht 5\' 5"  (1.651 m)  Wt 120 lb (54.432 kg)  BMI 19.97 kg/m2  SpO2 95%  LMP 10/23/2002 Physical Exam Gen: well developed and well nourished appearing Head: NCAT Eyes: PERL, EOMI Nose: no epistaixis or rhinorrhea Mouth/throat: mucosa is moist and pink Neck: supple, no stridor Lungs: CTA B, no wheezing, rhonchi or rales, frequent bronchospastic cough with deep inspiration Chest wall: ttp over the left anterior chest wall, normal to inspection, no crepitus or skin changes.  CV: RRR, no murmur, extremities appear well perfused.  Abd: soft, notender, nondistended GU: small (< 1cm), mobile and tender right inguinal lymph nodse Back: no ttp, no cva ttp Skin: warm and dry Ext: normal to inspection, no dependent edema, no ttp, palp pulses x 4.  Neuro: CN ii-xii grossly intact, no focal deficits Psyche; normal affect,  calm and cooperative.   ED Course   Procedures (including critical care time) Labs Review  Results for orders placed during the hospital encounter of 12/26/13 (from the past 24 hour(s))  CBC WITH DIFFERENTIAL     Status: Abnormal   Collection Time    12/25/13 11:20 PM      Result Value Ref Range   WBC 13.8 (*) 4.0 - 10.5 K/uL   RBC 4.25  3.87 - 5.11 MIL/uL   Hemoglobin 14.2  12.0 - 15.0  g/dL   HCT 39.8  36.0 - 46.0 %   MCV 93.6  78.0 - 100.0 fL   MCH 33.4  26.0 - 34.0 pg   MCHC 35.7  30.0 - 36.0 g/dL   RDW 13.6  11.5 - 15.5 %   Platelets 251  150 - 400 K/uL   Neutrophils Relative % 84 (*) 43 - 77 %   Neutro Abs 11.5 (*) 1.7 - 7.7 K/uL   Lymphocytes Relative 9 (*) 12 - 46 %   Lymphs Abs 1.3  0.7 - 4.0 K/uL   Monocytes Relative 7  3 - 12 %   Monocytes Absolute 1.0  0.1 - 1.0 K/uL   Eosinophils Relative 0  0 - 5 %   Eosinophils Absolute 0.0  0.0 - 0.7 K/uL   Basophils Relative 0  0 - 1 %   Basophils Absolute 0.0  0.0 - 0.1 K/uL  COMPREHENSIVE METABOLIC PANEL     Status: Abnormal   Collection Time    12/25/13 11:20 PM      Result Value Ref Range   Sodium 135 (*) 137 - 147 mEq/L   Potassium 4.0  3.7 - 5.3 mEq/L   Chloride 100  96 - 112 mEq/L   CO2 20  19 - 32 mEq/L   Glucose, Bld 103 (*) 70 - 99 mg/dL   BUN 9  6 - 23 mg/dL   Creatinine, Ser 0.64  0.50 - 1.10 mg/dL   Calcium 9.9  8.4 - 10.5 mg/dL   Total Protein 8.3  6.0 - 8.3 g/dL   Albumin 4.4  3.5 - 5.2 g/dL   AST 47 (*) 0 - 37 U/L   ALT 42 (*) 0 - 35 U/L   Alkaline Phosphatase 211 (*) 39 - 117 U/L   Total Bilirubin 0.6  0.3 - 1.2 mg/dL   GFR calc non Af Amer >90  >90 mL/min   GFR calc Af Amer >90  >90 mL/min  TROPONIN I     Status: None   Collection Time    12/25/13 11:20 PM      Result Value Ref Range   Troponin I <0.30  <0.30 ng/mL  URINALYSIS, ROUTINE W REFLEX MICROSCOPIC     Status: Abnormal   Collection Time    12/25/13 11:25 PM      Result Value Ref Range   Color, Urine YELLOW  YELLOW   APPearance CLOUDY (*) CLEAR   Specific Gravity,  Urine 1.013  1.005 - 1.030   pH 7.0  5.0 - 8.0   Glucose, UA NEGATIVE  NEGATIVE mg/dL   Hgb urine dipstick NEGATIVE  NEGATIVE   Bilirubin Urine NEGATIVE  NEGATIVE   Ketones, ur NEGATIVE  NEGATIVE mg/dL   Protein, ur NEGATIVE  NEGATIVE mg/dL   Urobilinogen, UA 1.0  0.0 - 1.0 mg/dL   Nitrite NEGATIVE  NEGATIVE   Leukocytes, UA NEGATIVE  NEGATIVE    Imaging Review Dg Chest 2 View  12/26/2013   CLINICAL DATA:  Chest pain, fever, right groin pain, medial right leg pain.  EXAM: CHEST  2 VIEW  COMPARISON:  DG RIBS UNILATERAL W/CHEST*R* dated 12/01/2013  FINDINGS: The heart size and mediastinal contours are within normal limits. Slight fibrosis in the left lung base. Both lungs are otherwise clear. Old right rib fractures. Degenerative changes in the spine. No change since prior study.  IMPRESSION: No active cardiopulmonary disease.   Electronically Signed   By: Lucienne Capers M.D.   On: 12/26/2013 00:39  EKG: nsr, no acute ischemic changes, normal intervals, normal axis, prolonged qrs complex   MDM   DDX: bronchitis, pneumonia, URI, UTI, chest wall pain, ptx, pleural effusion, pericarditis.   Patient with bronchitis. Counseled to stop smoking Noted to have chest wall pain secondary to cough from bronchitis. CXR is negative for infiltrate or other acute process. Labs reassuring and notable only for slightly elevated transaminases likely secondary to known Hepatitis C. I have offered to treat with IVF. But, patient says she can tolerate po and would prefer to go home. Dose of Tussionex to be administered in ED. Patient counseled re: return precautions and need for follow up and for PCP to monitor right inguinal LAN. Patient counseled re: smoking cessation.   Elyn Peers, MD 12/26/13 [phone]

## 2013-12-26 NOTE — Discharge Instructions (Signed)
Acute Bronchitis Bronchitis is inflammation of the airways that extend from the windpipe into the lungs (bronchi). The inflammation often causes mucus to develop. This leads to a cough, which is the most common symptom of bronchitis.  In acute bronchitis, the condition usually develops suddenly and goes away over time, usually in a couple weeks. Smoking, allergies, and asthma can make bronchitis worse. Repeated episodes of bronchitis may cause further lung problems.  CAUSES Acute bronchitis is most often caused by the same virus that causes a cold. The virus can spread from person to person (contagious).  SIGNS AND SYMPTOMS   Cough.   Fever.   Coughing up mucus.   Body aches.   Chest congestion.   Chills.   Shortness of breath.   Sore throat.  DIAGNOSIS  Acute bronchitis is usually diagnosed through a physical exam. Tests, such as chest X-rays, are sometimes done to rule out other conditions.  TREATMENT  Acute bronchitis usually goes away in a couple weeks. Often times, no medical treatment is necessary. Medicines are sometimes given for relief of fever or cough. Antibiotics are usually not needed but may be prescribed in certain situations. In some cases, an inhaler may be recommended to help reduce shortness of breath and control the cough. A cool mist vaporizer may also be used to help thin bronchial secretions and make it easier to clear the chest.  HOME CARE INSTRUCTIONS  Get plenty of rest.   Drink enough fluids to keep your urine clear or pale yellow (unless you have a medical condition that requires fluid restriction). Increasing fluids may help thin your secretions and will prevent dehydration.   Only take over-the-counter or prescription medicines as directed by your health care provider.   Avoid smoking and secondhand smoke. Exposure to cigarette smoke or irritating chemicals will make bronchitis worse. If you are a smoker, consider using nicotine gum or skin  patches to help control withdrawal symptoms. Quitting smoking will help your lungs heal faster.   Reduce the chances of another bout of acute bronchitis by washing your hands frequently, avoiding people with cold symptoms, and trying not to touch your hands to your mouth, nose, or eyes.   Follow up with your health care provider as directed.  SEEK MEDICAL CARE IF: Your symptoms do not improve after 1 week of treatment.  SEEK IMMEDIATE MEDICAL CARE IF:  You develop an increased fever or chills.   You have chest pain.   You have severe shortness of breath.  You have bloody sputum.   You develop dehydration.  You develop fainting.  You develop repeated vomiting.  You develop a severe headache. MAKE SURE YOU:   Understand these instructions.  Will watch your condition.  Will get help right away if you are not doing well or get worse. Document Released: 10/04/2004 Document Revised: 04/29/2013 Document Reviewed: 02/17/2013 [facility] Patient Information 2014 [place].   PLEASE FOLLOW UP WITH YOUR PRIMARY CARE PROVIDER AND ASK HER TO MONITOR RIGHT INGUINAL (GROIN) LYMPH NODE.

## 2013-12-30 ENCOUNTER — Other Ambulatory Visit (HOSPITAL_COMMUNITY)
Admission: RE | Admit: 2013-12-30 | Discharge: 2013-12-30 | Disposition: A | Discharge status: Home / Self Care | Payer: Medicare Other | Source: Ambulatory Visit | Attending: Internal Medicine | Admitting: Internal Medicine

## 2013-12-30 ENCOUNTER — Ambulatory Visit (INDEPENDENT_AMBULATORY_CARE_PROVIDER_SITE_OTHER): Payer: Medicare Other | Admitting: Internal Medicine

## 2013-12-30 ENCOUNTER — Encounter: Payer: Self-pay | Admitting: Internal Medicine

## 2013-12-30 VITALS — BP 128/82 | HR 77 | Temp 98.3°F | Ht 65.0 in | Wt 114.1 lb

## 2013-12-30 DIAGNOSIS — R1031 Right lower quadrant pain: Secondary | ICD-10-CM

## 2013-12-30 DIAGNOSIS — Z0000 Encounter for general adult medical examination without abnormal findings: Secondary | ICD-10-CM

## 2013-12-30 DIAGNOSIS — R590 Localized enlarged lymph nodes: Secondary | ICD-10-CM

## 2013-12-30 DIAGNOSIS — F329 Major depressive disorder, single episode, unspecified: Secondary | ICD-10-CM

## 2013-12-30 DIAGNOSIS — N951 Menopausal and female climacteric states: Secondary | ICD-10-CM

## 2013-12-30 DIAGNOSIS — I10 Essential (primary) hypertension: Secondary | ICD-10-CM

## 2013-12-30 DIAGNOSIS — Z113 Encounter for screening for infections with a predominantly sexual mode of transmission: Secondary | ICD-10-CM | POA: Insufficient documentation

## 2013-12-30 DIAGNOSIS — N760 Acute vaginitis: Secondary | ICD-10-CM | POA: Insufficient documentation

## 2013-12-30 DIAGNOSIS — K219 Gastro-esophageal reflux disease without esophagitis: Secondary | ICD-10-CM

## 2013-12-30 DIAGNOSIS — F3289 Other specified depressive episodes: Secondary | ICD-10-CM

## 2013-12-30 DIAGNOSIS — Z8249 Family history of ischemic heart disease and other diseases of the circulatory system: Secondary | ICD-10-CM

## 2013-12-30 DIAGNOSIS — R599 Enlarged lymph nodes, unspecified: Secondary | ICD-10-CM

## 2013-12-30 LAB — RPR

## 2013-12-30 MED ORDER — TRAMADOL HCL 50 MG PO TABS: 50.0000 mg | ORAL_TABLET | Freq: Two times a day (BID) | ORAL | Status: DC | PRN

## 2013-12-30 MED ORDER — OMEPRAZOLE 20 MG PO CPDR
20.0000 mg | DELAYED_RELEASE_CAPSULE | Freq: Every day | ORAL | Status: DC
Start: 2013-12-30 — End: 2014-04-17

## 2013-12-30 NOTE — Assessment & Plan Note (Addendum)
Patient has inguinal lymphadenopathy, right greater than left. Nodes are subjectively tender, but not significantly tender to direct palpation. She has a history of right groin pain for which she was seen in the clinic in October. UA, BMP, CBC were unremarkable. CT abdomen/pelvis at that time was also unremarkable. Patient is sexually active and does not use barrier contraception. Pelvic exam was unremarkable today. We will rule out an infectious etiology as tender inguinal lymph nodes typically are reactive. - Gonorrhea/Chlamydia probe - Wet prep - HIV - RPR - If no resolution in 4 weeks, patient will return to the clinic and we will consider a biopsy - Prescribed short course of tramadol for groin pain   ADDENDUM: HIV and RPR are both NR. GC/CT negative. Wet prep shows CANDIDA. I have ordered Diflucan 150 mg x1. Patient was called and informed of the diagnosis and treatment plan.

## 2013-12-30 NOTE — Assessment & Plan Note (Addendum)
Patient has been noncompliant with her Celexa. She endorses irritable mood and anxiety at home and asks for Klonopin by name. I told her I do not prescribe Klonopin to patients with liver disease. She recently self tapered her Premarin at home, symptoms may be related to menopause as she also endorses insomnia. - Recommended she resume Celexa, counseled the patient that it can take weeks for the medication to kick in - If no improvement, consider referral to psychiatry or behavioral health

## 2013-12-30 NOTE — Assessment & Plan Note (Signed)
Being worked up by Dr. Caryl Comes. I will follow his notes.

## 2013-12-30 NOTE — Assessment & Plan Note (Addendum)
Pap smear not performed as patient does not have a cervix.

## 2013-12-30 NOTE — Progress Notes (Signed)
Subjective:    Patient ID: Amanda Bennett, female    DOB: [birth year], 58 y.o.   MRN: [id number]  HPI  Amanda Bennett is a 57 y.o. female with a history of HCV, depression, HTN, chronic pain, presenting for routine follow up.   Right groin pain/lymphadenopathy - Patient reports a dull, aching, constant pain in her right groin. It is worse at night when she is lying down. She says an ER doctor told her she had swollen lymph nodes there and that she might need a biopsy. She is sexually active with one partner. She does not use condoms. Last menstrual period was 12 years ago. She is status post hysterectomy. She denies vaginal discharge or dyspareunia. She does have pain in her inguinal area with sex.  Mood changes - Patient reports an irritable mood and anxiety at home. She tells me her antidepressants are not working and she would like a prescription for Klonopin. She's not been compliant with her Celexa. She used to take Premarin for hot flashes, mood swings, insomnia but has self discontinued this  HTN - Compliant with amlodipine 5mg  daily.   Rib fracture - Sustained in a fall, seen in the ED on 3/24 and noted to have a right rib fracture, she was able to ambulate without difficulty or ataxia at that time.   Bronchitis - Diagnosed in ED on 4/18 when she presented with secondary chest wall pain exacerbated by cough. Chest x-ray was negative for infiltrate. Labs reassuring. Troponin negative. Patient was given a dose of Tussionex and offered IV fluids, but she declined. She was counseled to stop smoking. She's been taking Tessalon at home with some symptomatic relief.  Family history of long QT - She is followed by Dr. Caryl Comes because there is a family history of Long QT syndrome. 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 It sounds like Dr. Caryl Comes is working on determining the pedigree prior to genetic testing. She did endorse exercise intolerance  and chest pain with claudication to him, so she underwent a normal stress test on 09/23/2013, EF 69%. ABIs were also performed 09/17/13 and normal.   Current Outpatient Prescriptions on File Prior to Visit  Medication Sig Dispense Refill  . albuterol (PROVENTIL HFA;VENTOLIN HFA) 108 (90 BASE) MCG/ACT inhaler Inhale 1 puff into the lungs every 4 (four) hours as needed. Wheezing  1 Inhaler  5  . amLODipine (NORVASC) 5 MG tablet Take 1 tablet (5 mg total) by mouth daily.  30 tablet  11  . benzonatate (TESSALON) 100 MG capsule Take 1 capsule (100 mg total) by mouth every 8 (eight) hours.  21 capsule  0  . chlorpheniramine-HYDROcodone (TUSSIONEX PENNKINETIC ER) 10-8 MG/5ML LQCR Take 5 mLs by mouth every 12 (twelve) hours as needed for cough.  100 mL  0  . citalopram (CELEXA) 40 MG tablet Take 1 tablet (40 mg total) by mouth daily.  30 tablet  11  . DULoxetine (CYMBALTA) 20 MG capsule Take 1 capsule (20 mg total) by mouth 2 (two) times daily.  60 capsule  3  . estrogens, conjugated, (PREMARIN) 0.3 MG tablet Take 1 tablet (0.3 mg total) by mouth daily. Take daily for 21 days then do not take for 7 days.  30 tablet  1  . gabapentin (NEURONTIN) 300 MG capsule Take 2 capsules (600 mg total) by mouth 4 (four) times daily.  240 capsule  3  . Ledipasvir-Sofosbuvir (HARVONI PO) Take 1  tablet by mouth daily.      Marland Kitchen lidocaine (LMX) 4 % cream Apply 1 application topically 3 (three) times daily as needed (pain). Apply to rib area and left knee      . Menthol, Topical Analgesic, (ICY HOT EX) Apply 1 application topically 4 (four) times daily as needed (pain). Apply to rib area and left knee      . omeprazole (PRILOSEC) 20 MG capsule Take 20 mg by mouth daily.      Marland Kitchen oxyCODONE (OXY IR/ROXICODONE) 5 MG immediate release tablet Take 1 tablet (5 mg total) by mouth every 4 (four) hours as needed for severe pain.  10 tablet  0  . traZODone (DESYREL) 25 mg TABS tablet Take 25 mg by mouth at bedtime.        Review of Systems   Constitutional: Negative for fever and chills.  Cardiovascular: Negative for chest pain.  Gastrointestinal: Negative for abdominal pain.  Genitourinary: Positive for pelvic pain (In right inguinal area). Negative for dysuria, flank pain, vaginal bleeding, vaginal discharge, genital sores, vaginal pain and menstrual problem.  Skin: Negative for rash.  Psychiatric/Behavioral: Positive for agitation. The patient is nervous/anxious.        Objective:   Physical Exam  Constitutional: She is oriented to person, place, and time. She appears well-developed and well-nourished. No distress.  HENT:  Head: Normocephalic and atraumatic.  Eyes: Conjunctivae and EOM are normal. Pupils are equal, round, and reactive to light.  Neck: Normal range of motion. Neck supple.  Cardiovascular: Normal rate, regular rhythm and normal heart sounds.  Exam reveals no gallop and no friction rub.   No murmur heard. Pulmonary/Chest: Effort normal and breath sounds normal. She has no wheezes.  Abdominal: Hernia confirmed negative in the right inguinal area and confirmed negative in the left inguinal area.  Genitourinary: Vagina normal. There is no rash or lesion on the right labia. There is no rash or lesion on the left labia. No erythema or bleeding around the vagina. No vaginal discharge found.  Patient is status post hysterectomy, cervix not present. No uterus or ovaries on bimanual exam.  Musculoskeletal: Normal range of motion. She exhibits no edema and no tenderness.  Lymphadenopathy:       Right: Inguinal (Several pea-to-bean-sized lymph nodes, patient says they are subjectively tender but does not react to direct palpation) adenopathy present.       Left: Inguinal (1-2 small palpable lymph nodes, less than on right) adenopathy present.  Neurological: She is alert and oriented to person, place, and time. No cranial nerve deficit.  Skin: Skin is warm and dry.  Psychiatric: She has a normal mood and affect.    Appears happy, interactive, not particularly anxious    Filed Vitals:   12/30/13 1557  BP: 128/82  Pulse: 77  Temp: 98.3 F (36.8 C)      Assessment & Plan:   Please see problem based charting.

## 2013-12-30 NOTE — Assessment & Plan Note (Signed)
I have refilled her Prilosec 20 mg daily.

## 2013-12-30 NOTE — Assessment & Plan Note (Signed)
Patient tapered herself off Premarin at home. I have removed it from her from her med list.

## 2013-12-30 NOTE — Progress Notes (Signed)
Attending note: Presenting problems, physical findings, medications, reviewed with resident physician Dr. Lesly Dukes and I concur with the management plan. Murriel Hopper, MD, [place]  [facility]/Internal Medicine

## 2013-12-30 NOTE — Assessment & Plan Note (Signed)
BP Readings from Last 3 Encounters:  12/30/13 128/82  12/26/13 98/63  12/01/13 122/68    Lab Results  Component Value Date   NA 135* 12/25/2013   K 4.0 12/25/2013   CREATININE 0.64 12/25/2013    Assessment: Blood pressure control:  Well controlled Progress toward BP goal:   At goal  Plan: Medications:  continue current medications

## 2013-12-30 NOTE — Patient Instructions (Addendum)
Thank you for your visit. - We did a pelvic exam today and everything looked normal. I did not see signs of infection. I will call if the results of your lab tests for vaginal infections are abnormal. - In the meantime, I have given you a short course of Tramadol for pain. I do not recommend rubbing topical creams in the area. - I recommend you restart your depression medication, Celexa. It can take several weeks for this medicine to "kick in" and it is also use to treat anxiety. - I will follow your chart for notes from Dr. Caryl Comes about your prolonged QT syndrome. - If your swollen lymph nodes do not get better, please return in 1 month and we will arrange for a biopsy to determine what exactly it is.

## 2013-12-31 LAB — CERVICOVAGINAL ANCILLARY ONLY
Chlamydia: NEGATIVE
Neisseria Gonorrhea: NEGATIVE
Wet Prep (BD Affirm): NEGATIVE
Wet Prep (BD Affirm): NEGATIVE
Wet Prep (BD Affirm): POSITIVE — AB

## 2013-12-31 LAB — HIV ANTIBODY (ROUTINE TESTING W REFLEX): HIV 1&2 Ab, 4th Generation: NONREACTIVE

## 2013-12-31 MED ORDER — FLUCONAZOLE 150 MG PO TABS: 150.0000 mg | ORAL_TABLET | Freq: Every day | ORAL | Status: DC

## 2013-12-31 NOTE — Addendum Note (Signed)
Addended by: Oceania Noori, Wynelle Bourgeois on: 12/31/2013 05:08 PM   Modules accepted: Orders

## 2014-01-07 ENCOUNTER — Encounter: Payer: Self-pay | Admitting: Internal Medicine

## 2014-01-07 ENCOUNTER — Ambulatory Visit (INDEPENDENT_AMBULATORY_CARE_PROVIDER_SITE_OTHER): Payer: Medicare Other | Admitting: Internal Medicine

## 2014-01-07 VITALS — BP 122/82 | HR 76 | Temp 97.4°F | Ht 65.0 in | Wt 115.6 lb

## 2014-01-07 DIAGNOSIS — R590 Localized enlarged lymph nodes: Secondary | ICD-10-CM

## 2014-01-07 DIAGNOSIS — B1920 Unspecified viral hepatitis C without hepatic coma: Secondary | ICD-10-CM

## 2014-01-07 DIAGNOSIS — R599 Enlarged lymph nodes, unspecified: Secondary | ICD-10-CM

## 2014-01-07 DIAGNOSIS — I10 Essential (primary) hypertension: Secondary | ICD-10-CM

## 2014-01-07 NOTE — Assessment & Plan Note (Signed)
Well controlled.  Plans: Continue current regimen. 

## 2014-01-07 NOTE — Patient Instructions (Signed)
We will call you to inform about the surgery appointment. Take all the medications as advised before.

## 2014-01-07 NOTE — Progress Notes (Signed)
Subjective:   Patient ID: Amanda Bennett female   DOB: 20-Feb-1956 58 y.o.   MRN: [id number]  HPI: Amanda Bennett is a 58 y.o. woman with PMH significant for Hepatitis C, HTN, Depression comes to the office with bilateral groin pain for the last 2 months.   Patient was seen in the clinic on 12/30/13 for the same problem. Patient was found to have right and left inguinal lymph nodes (R>L) on physical exam. Patient underwent HIV panel, RPR, wet prep, GC/ chlamydia probes. Wet prep revealed candida but all the other work up is negative. Patient was recommended to follow up in 4 weeks. The plan was to refer her to oncology at 4 weeks if the inguinal lymph nodes still present. Patient reports that she has pain in her both groins (R>L), constant pain, 5/10 in severity, aggravated by lying down, walking and relieved by nothing. Patient reports that she is concerned about her lymph nodes as her dad died of pancreatic cancer and her nephew was diagnosed with colon cancer. She states that she cannot wait for 4 weeks as she cannot sleep with the pain. She is requesting further evaluation right away.  She denies any fever, chills, nausea, vomiting, SOB, chest pain, abdominal pain, back pain, vaginal discharge, bleeding from vagina. She denies any loss of appetite, weight loss. She denies any similar symptoms in the axilla. She denies any other complaints.    Past Medical History  Diagnosis Date  . Foot fracture, right 07/2010    per x-ray 07/2010 - oblique comminuted fifth metatarsal fracture, followed by Dr. Lorelei Pont  . Depression   . Insomnia   . GERD (gastroesophageal reflux disease)   . Carpal tunnel syndrome   . Cervical spinal stenosis 2008    per x-ray findings 2008  . Pulmonary nodule, right     RUL nodule, 7 mm noted on Chest XRAY - 06/2008, per CT follow up in 2009 - No suspicious pulmonary nodules or masse noted  . Cervicalgia   . Pain in limb   . Lumbago   . Chronic pain syndrome     . Cervical syndrome   . Primary localized osteoarthrosis, pelvic region and thigh   . Lesion of ulnar nerve   . Anxiety   . Osteoporosis   . Unexplained weight loss   . Visual disturbance   . Cough   . Arthritis pain   . Bruises easily   . Wound disruption, post-op, skin   . PONV (postoperative nausea and vomiting)     shortness of breath   . Hepatitis     hx of Hep C    Current Outpatient Prescriptions  Medication Sig Dispense Refill  . albuterol (PROVENTIL HFA;VENTOLIN HFA) 108 (90 BASE) MCG/ACT inhaler Inhale 1 puff into the lungs every 4 (four) hours as needed. Wheezing  1 Inhaler  5  . amLODipine (NORVASC) 5 MG tablet Take 1 tablet (5 mg total) by mouth daily.  30 tablet  11  . benzonatate (TESSALON) 100 MG capsule Take 1 capsule (100 mg total) by mouth every 8 (eight) hours.  21 capsule  0  . chlorpheniramine-HYDROcodone (TUSSIONEX PENNKINETIC ER) 10-8 MG/5ML LQCR Take 5 mLs by mouth every 12 (twelve) hours as needed for cough.  100 mL  0  . citalopram (CELEXA) 40 MG tablet Take 1 tablet (40 mg total) by mouth daily.  30 tablet  11  . DULoxetine (CYMBALTA) 20 MG capsule Take 1 capsule (20 mg total) by mouth 2 (  two) times daily.  60 capsule  3  . fluconazole (DIFLUCAN) 150 MG tablet Take 1 tablet (150 mg total) by mouth daily.  1 tablet  0  . gabapentin (NEURONTIN) 300 MG capsule Take 2 capsules (600 mg total) by mouth 4 (four) times daily.  240 capsule  3  . Ledipasvir-Sofosbuvir (HARVONI PO) Take 1 tablet by mouth daily.      Marland Kitchen omeprazole (PRILOSEC) 20 MG capsule Take 1 capsule (20 mg total) by mouth daily.  30 capsule  11  . traMADol (ULTRAM) 50 MG tablet Take 1 tablet (50 mg total) by mouth every 12 (twelve) hours as needed.  20 tablet  0  . traZODone (DESYREL) 25 mg TABS tablet Take 25 mg by mouth at bedtime.       No current facility-administered medications for this visit.   Family History  Problem Relation Age of Onset  . Stroke Neg Hx   . Stomach cancer Neg Hx   .  Cancer Father     colon/prostate  . Colon cancer Father 34    deceased at age 49 from colon cancer  . Cancer Paternal Grandmother     ovarian   History   Social History  . Marital Status: Single    Spouse Name: N/A    Number of Children: N/A  . Years of Education: N/A   Social History Main Topics  . Smoking status: Light Tobacco Smoker -- 0.10 packs/day for 20 years    Types: Cigarettes    Last Attempt to Quit: 01/08/2011  . Smokeless tobacco: Never Used     Comment: pack lasts about a week./ 1 PK EVERY 3 DAYS  . Alcohol Use: No  . Drug Use: No  . Sexual Activity: None   Other Topics Concern  . None   Social History Narrative   Financial assistance approved for 100% discount at [facility] and has [facility] card, also given pink county card.  per Bonna Gains 12/09/2009   Review of Systems: Pertinent items are noted in HPI. Objective:  Physical Exam: Filed Vitals:   01/07/14 0902 01/07/14 0904  BP:  122/82  Pulse: 76 76  Temp: 97.4 F (36.3 C) 97.4 F (36.3 C)  TempSrc: Oral Oral  Height: 5\' 5"  (1.651 m) 5\' 5"  (1.651 m)  Weight: 115 lb 9.6 oz (52.436 kg) 115 lb 9.6 oz (52.436 kg)  SpO2: 99% 99%   Constitutional: Vital signs reviewed.   Patient is a well-developed and well-nourished and is in no acute distress and cooperative with exam. Alert and oriented x3.  Head: Normocephalic and atraumatic Neck: Supple, Trachea midline normal ROM, No JVD, mass, thyromegaly, or carotid bruit present.  Cardiovascular: RRR, S1 normal, S2 normal, no MRG, pulses symmetric and intact bilaterally Pulmonary/Chest: normal respiratory effort, CTAB, no wheezes, rales, or rhonchi Abdominal: Soft. Non-tender, non-distended, bowel sounds are normal, no masses, organomegaly, or guarding present.  Musculoskeletal: No joint deformities, erythema, or stiffness, ROM full and no nontender Hematology: Tender palpable right and left multiple inguinal LN's.   Neurological: A&O x3, Strength is normal and  symmetric bilaterally. Skin: Warm, dry and intact. No rash, cyanosis, or clubbing.  Psychiatric: Normal mood and affect.  Assessment & Plan:

## 2014-01-07 NOTE — Assessment & Plan Note (Addendum)
Bilateral Inguinal Lymphadenopathy of unclear etiology. HIV - non reactive, RPR - non reactive, GC probe - negative, Chlamydia probe - negative, wet prep - candida (treated appropriately) Discussed with Dr. Eppie Gibson regarding further management and plan. Plans: General surgery referral for bilateral inguinal LN biopsy to determine the underlying pathology. Follow up in a month or sooner if the biopsy report is back. Further management as per biopsy results. Patient is agreeable to the plan and feels relieved with surgery referral.

## 2014-01-07 NOTE — Assessment & Plan Note (Signed)
Patient is currently on Harvoni PO therapy for a total duration of 12 weeks. Patient reports she will be finishing the therapy tomorrow. Plans: As per her hepatologist. Recommended to follow up with them.

## 2014-01-11 NOTE — Progress Notes (Signed)
Case discussed with Dr. Boggala at time of visit.  We reviewed the resident's history and exam and pertinent patient test results.  I agree with the assessment, diagnosis, and plan of care documented in the resident's note. 

## 2014-01-20 ENCOUNTER — Ambulatory Visit (INDEPENDENT_AMBULATORY_CARE_PROVIDER_SITE_OTHER): Payer: Medicare Other | Admitting: General Surgery

## 2014-01-20 ENCOUNTER — Encounter (INDEPENDENT_AMBULATORY_CARE_PROVIDER_SITE_OTHER): Payer: Self-pay | Admitting: General Surgery

## 2014-01-20 VITALS — BP 142/82 | HR 84 | Temp 97.6°F | Resp 12 | Ht 64.0 in | Wt 114.0 lb

## 2014-01-20 DIAGNOSIS — R599 Enlarged lymph nodes, unspecified: Secondary | ICD-10-CM

## 2014-01-20 DIAGNOSIS — R590 Localized enlarged lymph nodes: Secondary | ICD-10-CM

## 2014-01-20 NOTE — Progress Notes (Signed)
Patient ID: Amanda Bennett, female   DOB: 1956/01/27, 58 y.o.   MRN: [id number]  Chief Complaint  Patient presents with  . New Evaluation    eval lymphadenopathy    HPI Amanda Bennett is a 58 y.o. female.  Chief complaint: Bilateral inguinal lymphadenopathy HPI Patient is a 2 month history of swelling in bilateral groins. She has pain on the right side more than the left, a generalized achiness. She underwent further workup prior care physician which was mostly unrevealing. The lymphadenopathy has persisted and I was asked to see her in consultation to consider biopsy. Again the right side is more symptomatic. Past Medical History  Diagnosis Date  . Foot fracture, right 07/2010    per x-ray 07/2010 - oblique comminuted fifth metatarsal fracture, followed by Dr. Lorelei Pont  . Depression   . Insomnia   . GERD (gastroesophageal reflux disease)   . Carpal tunnel syndrome   . Cervical spinal stenosis 2008    per x-ray findings 2008  . Pulmonary nodule, right     RUL nodule, 7 mm noted on Chest XRAY - 06/2008, per CT follow up in 2009 - No suspicious pulmonary nodules or masse noted  . Cervicalgia   . Pain in limb   . Lumbago   . Chronic pain syndrome   . Cervical syndrome   . Primary localized osteoarthrosis, pelvic region and thigh   . Lesion of ulnar nerve   . Anxiety   . Osteoporosis   . Unexplained weight loss   . Visual disturbance   . Cough   . Arthritis pain   . Bruises easily   . Wound disruption, post-op, skin   . PONV (postoperative nausea and vomiting)     shortness of breath   . Hepatitis     hx of Hep C     Past Surgical History  Procedure Laterality Date  . Abdominal hysterectomy    . Finger amputated      right ring  . Drain in left ring finger      cat bite  . Broken clavical    . Cholecystectomy    . Hemorrhoid surgery  11/25/2006  . Cholecystectomy  10/30/2011    Procedure: LAPAROSCOPIC CHOLECYSTECTOMY WITH INTRAOPERATIVE CHOLANGIOGRAM;  Surgeon:  Imogene Burn. Georgette Dover, MD;  Location: [facility];  Service: General;  Laterality: N/A;    Family History  Problem Relation Age of Onset  . Stroke Neg Hx   . Stomach cancer Neg Hx   . Cancer Father     colon/prostate  . Colon cancer Father 69    deceased at age 66 from colon cancer  . Cancer Paternal Grandmother     ovarian    Social History History  Substance Use Topics  . Smoking status: Light Tobacco Smoker -- 0.10 packs/day for 20 years    Types: Cigarettes    Last Attempt to Quit: 01/08/2011  . Smokeless tobacco: Never Used     Comment: pack lasts about a week./ 1 PK EVERY 3 DAYS  . Alcohol Use: No    Allergies  Allergen Reactions  . Morphine And Related Shortness Of Breath and Itching  . Prozac [Fluoxetine Hcl] Other (See Comments)    depression  . Tylenol [Acetaminophen] Other (See Comments)    Liver Failure     Current Outpatient Prescriptions  Medication Sig Dispense Refill  . albuterol (PROVENTIL HFA;VENTOLIN HFA) 108 (90 BASE) MCG/ACT inhaler Inhale 1 puff into the lungs every 4 (four) hours as needed.  Wheezing  1 Inhaler  5  . amLODipine (NORVASC) 5 MG tablet Take 1 tablet (5 mg total) by mouth daily.  30 tablet  11  . citalopram (CELEXA) 40 MG tablet Take 1 tablet (40 mg total) by mouth daily.  30 tablet  11  . gabapentin (NEURONTIN) 300 MG capsule Take 2 capsules (600 mg total) by mouth 4 (four) times daily.  240 capsule  3  . omeprazole (PRILOSEC) 20 MG capsule Take 1 capsule (20 mg total) by mouth daily.  30 capsule  11  . traZODone (DESYREL) 25 mg TABS tablet Take 25 mg by mouth at bedtime.      . DULoxetine (CYMBALTA) 20 MG capsule Take 1 capsule (20 mg total) by mouth 2 (two) times daily.  60 capsule  3   No current facility-administered medications for this visit.    Review of Systems Review of Systems  Constitutional: Negative for fever, chills and unexpected weight change.  HENT: Negative for congestion, hearing loss, sore throat, trouble swallowing and  voice change.   Eyes: Negative for visual disturbance.  Respiratory: Negative for cough and wheezing.   Cardiovascular: Negative for chest pain, palpitations and leg swelling.  Gastrointestinal: Negative for nausea, vomiting, abdominal pain, diarrhea, constipation, blood in stool, abdominal distention and anal bleeding.       Hep C  Genitourinary: Negative for hematuria, vaginal bleeding and difficulty urinating.  Musculoskeletal: Negative for arthralgias.       See HPI   Skin: Negative for rash and wound.  Neurological: Negative for seizures, syncope and headaches.  Hematological: Negative for adenopathy. Does not bruise/bleed easily.  Psychiatric/Behavioral: Negative for confusion.    Blood pressure 142/82, pulse 84, temperature 97.6 F (36.4 C), temperature source Temporal, resp. rate 12, height 5\' 4"  (1.626 m), weight 114 lb (51.71 kg), last menstrual period 10/23/2002.  Physical Exam Physical Exam  Constitutional: She is oriented to person, place, and time. She appears well-developed and well-nourished.  HENT:  Head: Normocephalic and atraumatic.  Eyes: EOM are normal. Pupils are equal, round, and reactive to light.  Neck: Normal range of motion. Neck supple. No tracheal deviation present.  Cardiovascular: Normal rate and normal heart sounds.   Pulmonary/Chest: Effort normal and breath sounds normal. No stridor.  Abdominal: Soft. Bowel sounds are normal. She exhibits no distension. There is no tenderness. There is no rebound and no guarding.  Bilateral inguinal lymphadenopathy, 1 cm palpable lymph node right groin with no tenderness, 2 8 mm lymph nodes palpable in the left groin without tenderness  Musculoskeletal: Normal range of motion.  Neurological: She is alert and oriented to person, place, and time.  Skin: Skin is warm and dry.  Psychiatric: She has a normal mood and affect.    Data Reviewed Office notes  Assessment    Bilateral inguinal lymphadenopathy, right  side more symptomatic     Plan    Operating room with the biopsy for further evaluation. We discussed possible outcome including inflammatory changes, lymphoma, or other cancer. She is concerned about this and eager to proceed with scheduling. We discussed the procedure and risks as well as benefits. She is agreeable.       Zenovia Jarred 01/20/2014, 10:46 AM

## 2014-01-27 ENCOUNTER — Encounter (HOSPITAL_BASED_OUTPATIENT_CLINIC_OR_DEPARTMENT_OTHER): Payer: Self-pay | Admitting: *Deleted

## 2014-01-27 NOTE — Progress Notes (Signed)
Labs done 12/30/13-ekg 4/15- Goes to [facility]

## 2014-02-03 ENCOUNTER — Encounter (HOSPITAL_BASED_OUTPATIENT_CLINIC_OR_DEPARTMENT_OTHER): Payer: Medicare Other | Admitting: Certified Registered"

## 2014-02-03 ENCOUNTER — Encounter (HOSPITAL_BASED_OUTPATIENT_CLINIC_OR_DEPARTMENT_OTHER): Payer: Self-pay | Admitting: *Deleted

## 2014-02-03 ENCOUNTER — Ambulatory Visit (HOSPITAL_BASED_OUTPATIENT_CLINIC_OR_DEPARTMENT_OTHER): Payer: Medicare Other | Admitting: Certified Registered"

## 2014-02-03 ENCOUNTER — Ambulatory Visit (HOSPITAL_BASED_OUTPATIENT_CLINIC_OR_DEPARTMENT_OTHER)
Admission: RE | Admit: 2014-02-03 | Discharge: 2014-02-03 | Disposition: A | Discharge status: Home / Self Care | Payer: Medicare Other | Source: Ambulatory Visit | Attending: General Surgery | Admitting: General Surgery

## 2014-02-03 ENCOUNTER — Encounter (HOSPITAL_BASED_OUTPATIENT_CLINIC_OR_DEPARTMENT_OTHER)
Admission: RE | Disposition: A | Discharge status: Home / Self Care | Payer: Self-pay | Source: Ambulatory Visit | Attending: General Surgery

## 2014-02-03 DIAGNOSIS — F411 Generalized anxiety disorder: Secondary | ICD-10-CM | POA: Insufficient documentation

## 2014-02-03 DIAGNOSIS — F3289 Other specified depressive episodes: Secondary | ICD-10-CM | POA: Insufficient documentation

## 2014-02-03 DIAGNOSIS — R599 Enlarged lymph nodes, unspecified: Secondary | ICD-10-CM

## 2014-02-03 DIAGNOSIS — K219 Gastro-esophageal reflux disease without esophagitis: Secondary | ICD-10-CM | POA: Insufficient documentation

## 2014-02-03 DIAGNOSIS — Z79899 Other long term (current) drug therapy: Secondary | ICD-10-CM | POA: Insufficient documentation

## 2014-02-03 DIAGNOSIS — B182 Chronic viral hepatitis C: Secondary | ICD-10-CM | POA: Insufficient documentation

## 2014-02-03 DIAGNOSIS — G894 Chronic pain syndrome: Secondary | ICD-10-CM | POA: Insufficient documentation

## 2014-02-03 DIAGNOSIS — R590 Localized enlarged lymph nodes: Secondary | ICD-10-CM

## 2014-02-03 DIAGNOSIS — F329 Major depressive disorder, single episode, unspecified: Secondary | ICD-10-CM | POA: Insufficient documentation

## 2014-02-03 DIAGNOSIS — F17200 Nicotine dependence, unspecified, uncomplicated: Secondary | ICD-10-CM | POA: Insufficient documentation

## 2014-02-03 HISTORY — DX: Presence of spectacles and contact lenses: Z97.3

## 2014-02-03 HISTORY — PX: INGUINAL LYMPH NODE BIOPSY: SHX5865

## 2014-02-03 SURGERY — BIOPSY, LYMPH NODE, INGUINAL, OPEN
Anesthesia: General | Site: Abdomen | Laterality: Right

## 2014-02-03 MED ORDER — MIDAZOLAM HCL 2 MG/2ML IJ SOLN: 1.0000 mg | INTRAMUSCULAR | Status: DC | PRN

## 2014-02-03 MED ORDER — FENTANYL CITRATE 0.05 MG/ML IJ SOLN
INTRAMUSCULAR | Status: AC
  Filled 2014-02-03: qty 6

## 2014-02-03 MED ORDER — DEXAMETHASONE SODIUM PHOSPHATE 4 MG/ML IJ SOLN
INTRAMUSCULAR | Status: DC | PRN
  Administered 2014-02-03: 10 mg via INTRAVENOUS

## 2014-02-03 MED ORDER — OXYCODONE HCL 5 MG PO TABS: 5.0000 mg | ORAL_TABLET | ORAL | Status: DC | PRN

## 2014-02-03 MED ORDER — OXYCODONE HCL 5 MG/5ML PO SOLN: 5.0000 mg | Freq: Once | ORAL | Status: DC | PRN

## 2014-02-03 MED ORDER — ONDANSETRON HCL 4 MG/2ML IJ SOLN
INTRAMUSCULAR | Status: DC | PRN
  Administered 2014-02-03: 4 mg via INTRAVENOUS

## 2014-02-03 MED ORDER — CEFAZOLIN SODIUM-DEXTROSE 2-3 GM-% IV SOLR
INTRAVENOUS | Status: AC
  Filled 2014-02-03: qty 50

## 2014-02-03 MED ORDER — HYDROMORPHONE HCL PF 1 MG/ML IJ SOLN
INTRAMUSCULAR | Status: AC
  Filled 2014-02-03: qty 1

## 2014-02-03 MED ORDER — LIDOCAINE HCL (CARDIAC) 20 MG/ML IV SOLN
INTRAVENOUS | Status: DC | PRN
  Administered 2014-02-03: 20 mg via INTRAVENOUS

## 2014-02-03 MED ORDER — MIDAZOLAM HCL 5 MG/5ML IJ SOLN
INTRAMUSCULAR | Status: DC | PRN
  Administered 2014-02-03: 2 mg via INTRAVENOUS

## 2014-02-03 MED ORDER — GLYCOPYRROLATE 0.2 MG/ML IJ SOLN
INTRAMUSCULAR | Status: DC | PRN
  Administered 2014-02-03: 0.2 mg via INTRAVENOUS

## 2014-02-03 MED ORDER — BUPIVACAINE-EPINEPHRINE 0.5% -1:200000 IJ SOLN
INTRAMUSCULAR | Status: DC | PRN
  Administered 2014-02-03: 10 mL

## 2014-02-03 MED ORDER — MIDAZOLAM HCL 2 MG/2ML IJ SOLN
INTRAMUSCULAR | Status: AC
  Filled 2014-02-03: qty 2

## 2014-02-03 MED ORDER — LACTATED RINGERS IV SOLN
INTRAVENOUS | Status: DC | PRN
  Administered 2014-02-03: 12:00:00 via INTRAVENOUS

## 2014-02-03 MED ORDER — CEFAZOLIN SODIUM-DEXTROSE 2-3 GM-% IV SOLR: 2.0000 g | INTRAVENOUS | Status: DC

## 2014-02-03 MED ORDER — CHLORHEXIDINE GLUCONATE 4 % EX LIQD: 1.0000 "application " | Freq: Once | CUTANEOUS | Status: DC

## 2014-02-03 MED ORDER — FENTANYL CITRATE 0.05 MG/ML IJ SOLN: 50.0000 ug | INTRAMUSCULAR | Status: DC | PRN

## 2014-02-03 MED ORDER — LACTATED RINGERS IV SOLN: INTRAVENOUS | Status: DC

## 2014-02-03 MED ORDER — ONDANSETRON HCL 4 MG/2ML IJ SOLN: 4.0000 mg | Freq: Four times a day (QID) | INTRAMUSCULAR | Status: DC | PRN

## 2014-02-03 MED ORDER — PROPOFOL 10 MG/ML IV BOLUS
INTRAVENOUS | Status: DC | PRN
  Administered 2014-02-03: 150 mg via INTRAVENOUS

## 2014-02-03 MED ORDER — BUPIVACAINE-EPINEPHRINE (PF) 0.5% -1:200000 IJ SOLN
INTRAMUSCULAR | Status: AC
  Filled 2014-02-03: qty 30

## 2014-02-03 MED ORDER — OXYCODONE HCL 5 MG PO TABS: 5.0000 mg | ORAL_TABLET | Freq: Once | ORAL | Status: DC | PRN

## 2014-02-03 MED ORDER — HYDROMORPHONE HCL PF 1 MG/ML IJ SOLN
0.2500 mg | INTRAMUSCULAR | Status: DC | PRN
Start: 2014-02-03 — End: 2014-02-03
  Administered 2014-02-03: 0.25 mg via INTRAVENOUS

## 2014-02-03 SURGICAL SUPPLY — 47 items
BLADE 10 SAFETY STRL DISP (BLADE) ×2 IMPLANT
BLADE 15 SAFETY STRL DISP (BLADE) ×2 IMPLANT
BLADE HEX COATED 2.75 (ELECTRODE) ×2 IMPLANT
BLADE SURG 15 STRL LF DISP TIS (BLADE) ×1 IMPLANT
BLADE SURG 15 STRL SS (BLADE) ×1
BLADE SURG ROTATE 9660 (MISCELLANEOUS) IMPLANT
CANISTER SUCT 1200ML W/VALVE (MISCELLANEOUS) ×2 IMPLANT
CHLORAPREP W/TINT 26ML (MISCELLANEOUS) ×2 IMPLANT
COVER MAYO STAND STRL (DRAPES) ×2 IMPLANT
COVER TABLE BACK 60X90 (DRAPES) ×2 IMPLANT
DECANTER SPIKE VIAL GLASS SM (MISCELLANEOUS) ×2 IMPLANT
DERMABOND ADVANCED (GAUZE/BANDAGES/DRESSINGS) ×1
DERMABOND ADVANCED .7 DNX12 (GAUZE/BANDAGES/DRESSINGS) ×1 IMPLANT
DRAIN PENROSE 1/2X12 LTX STRL (WOUND CARE) IMPLANT
DRAPE PED LAPAROTOMY (DRAPES) ×2 IMPLANT
DRAPE UTILITY XL STRL (DRAPES) ×2 IMPLANT
ELECT REM PT RETURN 9FT ADLT (ELECTROSURGICAL) ×2
ELECTRODE REM PT RTRN 9FT ADLT (ELECTROSURGICAL) ×1 IMPLANT
GLOVE BIO SURGEON STRL SZ8 (GLOVE) ×2 IMPLANT
GLOVE BIOGEL PI IND STRL 7.0 (GLOVE) ×1 IMPLANT
GLOVE BIOGEL PI IND STRL 8 (GLOVE) ×1 IMPLANT
GLOVE BIOGEL PI INDICATOR 7.0 (GLOVE) ×1
GLOVE BIOGEL PI INDICATOR 8 (GLOVE) ×1
GLOVE ECLIPSE 6.5 STRL STRAW (GLOVE) ×2 IMPLANT
GOWN STRL REUS W/ TWL LRG LVL3 (GOWN DISPOSABLE) ×1 IMPLANT
GOWN STRL REUS W/ TWL XL LVL3 (GOWN DISPOSABLE) ×1 IMPLANT
GOWN STRL REUS W/TWL LRG LVL3 (GOWN DISPOSABLE) ×1
GOWN STRL REUS W/TWL XL LVL3 (GOWN DISPOSABLE) ×1
NEEDLE HYPO 25X1 1.5 SAFETY (NEEDLE) ×2 IMPLANT
NS IRRIG 1000ML POUR BTL (IV SOLUTION) IMPLANT
PACK BASIN DAY SURGERY FS (CUSTOM PROCEDURE TRAY) ×2 IMPLANT
PENCIL BUTTON HOLSTER BLD 10FT (ELECTRODE) ×2 IMPLANT
SLEEVE SCD COMPRESS KNEE MED (MISCELLANEOUS) IMPLANT
SPONGE LAP 18X18 X RAY DECT (DISPOSABLE) IMPLANT
SPONGE LAP 4X18 X RAY DECT (DISPOSABLE) ×2 IMPLANT
SUT MON AB 4-0 PC3 18 (SUTURE) ×2 IMPLANT
SUT VIC AB 2-0 SH 27 (SUTURE)
SUT VIC AB 2-0 SH 27XBRD (SUTURE) IMPLANT
SUT VIC AB 3-0 SH 27 (SUTURE) ×1
SUT VIC AB 3-0 SH 27X BRD (SUTURE) ×1 IMPLANT
SUT VICRYL AB 2 0 TIE (SUTURE) IMPLANT
SUT VICRYL AB 2 0 TIES (SUTURE)
SYR CONTROL 10ML LL (SYRINGE) ×2 IMPLANT
TOWEL OR 17X24 6PK STRL BLUE (TOWEL DISPOSABLE) ×4 IMPLANT
TOWEL OR NON WOVEN STRL DISP B (DISPOSABLE) ×2 IMPLANT
TUBE CONNECTING 20X1/4 (TUBING) ×2 IMPLANT
YANKAUER SUCT BULB TIP NO VENT (SUCTIONS) ×2 IMPLANT

## 2014-02-03 NOTE — Transfer of Care (Signed)
Immediate Anesthesia Transfer of Care Note  Patient: Amanda Bennett  Procedure(s) Performed: Procedure(s) with comments: INGUINAL LYMPH NODE BIOPSY (Right) - right inguinal area  Patient Location: PACU  Anesthesia Type:General  Level of Consciousness: awake and sedated  Airway & Oxygen Therapy: Patient Spontanous Breathing and Patient connected to face mask oxygen  Post-op Assessment: Report given to PACU RN and Post -op Vital signs reviewed and stable  Post vital signs: Reviewed and stable  Complications: No apparent anesthesia complications

## 2014-02-03 NOTE — Discharge Instructions (Signed)

## 2014-02-03 NOTE — Interval H&P Note (Signed)
History and Physical Interval Note:  02/03/2014 12:36 PM  Amanda Bennett  has presented today for surgery, with the diagnosis of right inguinal lymphadenopathy   The various methods of treatment have been discussed with the patient and family. After consideration of risks, benefits and other options for treatment, the patient has consented to  Procedure(s): INGUINAL LYMPH NODE BIOPSY (Right) as a surgical intervention .  The patient's history has been reviewed, patient re-examined, site marked,no change in status, stable for surgery.  I have reviewed the patient's chart and labs.  Questions were answered to the patient's satisfaction.     Zenovia Jarred

## 2014-02-03 NOTE — Anesthesia Postprocedure Evaluation (Signed)
Anesthesia Post Note  Patient: Amanda Bennett  Procedure(s) Performed: Procedure(s) (LRB): INGUINAL LYMPH NODE BIOPSY (Right)  Anesthesia type: General  Patient location: PACU  Post pain: Pain level controlled and Adequate analgesia  Post assessment: Post-op Vital signs reviewed, Patient's Cardiovascular Status Stable, Respiratory Function Stable, Patent Airway and Pain level controlled  Last Vitals:  Filed Vitals:   02/03/14 1415  BP: 107/68  Pulse: 61  Temp:   Resp: 14    Post vital signs: Reviewed and stable  Level of consciousness: awake, alert  and oriented  Complications: No apparent anesthesia complications

## 2014-02-03 NOTE — Anesthesia Procedure Notes (Signed)
Procedure Name: LMA Insertion Date/Time: 02/03/2014 12:49 PM Performed by: Toula Moos L Pre-anesthesia Checklist: Patient identified, Emergency Drugs available, Suction available, Patient being monitored and Timeout performed Patient Re-evaluated:Patient Re-evaluated prior to inductionOxygen Delivery Method: Circle System Utilized Preoxygenation: Pre-oxygenation with 100% oxygen Intubation Type: IV induction Ventilation: Mask ventilation without difficulty LMA: LMA inserted LMA Size: 4.0 Number of attempts: 1 Airway Equipment and Method: bite block Placement Confirmation: positive ETCO2 and breath sounds checked- equal and bilateral Tube secured with: Tape Dental Injury: Teeth and Oropharynx as per pre-operative assessment

## 2014-02-03 NOTE — Anesthesia Preprocedure Evaluation (Signed)
Anesthesia Evaluation  Patient identified by MRN, date of birth, ID band Patient awake    Reviewed: Allergy & Precautions, H&P , NPO status , Patient's Chart, lab work & pertinent test results  History of Anesthesia Complications (+) PONV  Airway Mallampati: II  Neck ROM: full    Dental   Pulmonary asthma , Current Smoker,          Cardiovascular hypertension,     Neuro/Psych  Headaches, Anxiety Depression  Neuromuscular disease    GI/Hepatic GERD-  ,(+) Hepatitis -, C  Endo/Other    Renal/GU      Musculoskeletal   Abdominal   Peds  Hematology   Anesthesia Other Findings   Reproductive/Obstetrics                           Anesthesia Physical Anesthesia Plan  ASA: III  Anesthesia Plan: General   Post-op Pain Management:    Induction: Intravenous  Airway Management Planned: LMA  Additional Equipment:   Intra-op Plan:   Post-operative Plan: Extubation in OR  Informed Consent: I have reviewed the patients History and Physical, chart, labs and discussed the procedure including the risks, benefits and alternatives for the proposed anesthesia with the patient or authorized representative who has indicated his/her understanding and acceptance.     Plan Discussed with: CRNA, Anesthesiologist and Surgeon  Anesthesia Plan Comments:         Anesthesia Quick Evaluation

## 2014-02-03 NOTE — Op Note (Signed)
02/03/2014  1:19 PM  PATIENT:  Amanda Bennett  58 y.o. female  PRE-OPERATIVE DIAGNOSIS:  right inguinal lymphadenopathy   POST-OPERATIVE DIAGNOSIS:  right inguinal lymphadenopathy  PROCEDURE:  Procedure(s): RIGHT INGUINAL LYMPH NODE BIOPSY  SURGEON:  Surgeon(s): Zenovia Jarred, MD  ASSISTANTS: none   ANESTHESIA:   local and general  EBL:  Total I/O In: 600 [I.V.:600] Out: -   BLOOD ADMINISTERED:none  DRAINS: none   SPECIMEN:  Excision  DISPOSITION OF SPECIMEN:  PATHOLOGY  COUNTS:  YES  DICTATION: Viviann Spare Dictation Patient has persistent inguinal lymphadenopathy. She presents for right inguinal node biopsy. She was identified in the preop holding area. Her site was marked. She received intravenous antibiotics. She was brought to the operating room and general anesthesia with laryngeal mask airway was administered by the anesthesia staff. Right groin was prepped and draped in sterile fashion. We did time out procedure.Transverse incision Was made over the palpable lymph node. Subcutaneous tissues were dissected down revealing an enlarged lymph node. It was circumferentially dissected using cautery. It was excised entirely and sent to pathology for lymphoma protocol. Local anesthetic was injected. Wound was irrigated.Hemostasis was obtained in the wound. Deep tissues were approximated with running 3-0 Vicryl and the skin was closed with running 4-0 Monocryl subcuticular stitch followed by Dermabond. All counts were correct. Patient tolerated the procedure well without apparent complication was taken recovery in stable condition.  PATIENT DISPOSITION:  PACU - hemodynamically stable.   Delay start of Pharmacological VTE agent (>24hrs) due to surgical blood loss or risk of bleeding:  no  Georganna Skeans, MD, MPH, FACS Pager: [phone]  5/27/20151:19 PM

## 2014-02-03 NOTE — H&P (View-Only) (Signed)
Patient ID: Amanda Bennett, female   DOB: 1956/01/27, 58 y.o.   MRN: [id number]  Chief Complaint  Patient presents with  . New Evaluation    eval lymphadenopathy    HPI Amanda Bennett is a 58 y.o. female.  Chief complaint: Bilateral inguinal lymphadenopathy HPI Patient is a 2 month history of swelling in bilateral groins. She has pain on the right side more than the left, a generalized achiness. She underwent further workup prior care physician which was mostly unrevealing. The lymphadenopathy has persisted and I was asked to see her in consultation to consider biopsy. Again the right side is more symptomatic. Past Medical History  Diagnosis Date  . Foot fracture, right 07/2010    per x-ray 07/2010 - oblique comminuted fifth metatarsal fracture, followed by Dr. Lorelei Pont  . Depression   . Insomnia   . GERD (gastroesophageal reflux disease)   . Carpal tunnel syndrome   . Cervical spinal stenosis 2008    per x-ray findings 2008  . Pulmonary nodule, right     RUL nodule, 7 mm noted on Chest XRAY - 06/2008, per CT follow up in 2009 - No suspicious pulmonary nodules or masse noted  . Cervicalgia   . Pain in limb   . Lumbago   . Chronic pain syndrome   . Cervical syndrome   . Primary localized osteoarthrosis, pelvic region and thigh   . Lesion of ulnar nerve   . Anxiety   . Osteoporosis   . Unexplained weight loss   . Visual disturbance   . Cough   . Arthritis pain   . Bruises easily   . Wound disruption, post-op, skin   . PONV (postoperative nausea and vomiting)     shortness of breath   . Hepatitis     hx of Hep C     Past Surgical History  Procedure Laterality Date  . Abdominal hysterectomy    . Finger amputated      right ring  . Drain in left ring finger      cat bite  . Broken clavical    . Cholecystectomy    . Hemorrhoid surgery  11/25/2006  . Cholecystectomy  10/30/2011    Procedure: LAPAROSCOPIC CHOLECYSTECTOMY WITH INTRAOPERATIVE CHOLANGIOGRAM;  Surgeon:  Imogene Burn. Georgette Dover, MD;  Location: [facility];  Service: General;  Laterality: N/A;    Family History  Problem Relation Age of Onset  . Stroke Neg Hx   . Stomach cancer Neg Hx   . Cancer Father     colon/prostate  . Colon cancer Father 69    deceased at age 66 from colon cancer  . Cancer Paternal Grandmother     ovarian    Social History History  Substance Use Topics  . Smoking status: Light Tobacco Smoker -- 0.10 packs/day for 20 years    Types: Cigarettes    Last Attempt to Quit: 01/08/2011  . Smokeless tobacco: Never Used     Comment: pack lasts about a week./ 1 PK EVERY 3 DAYS  . Alcohol Use: No    Allergies  Allergen Reactions  . Morphine And Related Shortness Of Breath and Itching  . Prozac [Fluoxetine Hcl] Other (See Comments)    depression  . Tylenol [Acetaminophen] Other (See Comments)    Liver Failure     Current Outpatient Prescriptions  Medication Sig Dispense Refill  . albuterol (PROVENTIL HFA;VENTOLIN HFA) 108 (90 BASE) MCG/ACT inhaler Inhale 1 puff into the lungs every 4 (four) hours as needed.  Wheezing  1 Inhaler  5  . amLODipine (NORVASC) 5 MG tablet Take 1 tablet (5 mg total) by mouth daily.  30 tablet  11  . citalopram (CELEXA) 40 MG tablet Take 1 tablet (40 mg total) by mouth daily.  30 tablet  11  . gabapentin (NEURONTIN) 300 MG capsule Take 2 capsules (600 mg total) by mouth 4 (four) times daily.  240 capsule  3  . omeprazole (PRILOSEC) 20 MG capsule Take 1 capsule (20 mg total) by mouth daily.  30 capsule  11  . traZODone (DESYREL) 25 mg TABS tablet Take 25 mg by mouth at bedtime.      . DULoxetine (CYMBALTA) 20 MG capsule Take 1 capsule (20 mg total) by mouth 2 (two) times daily.  60 capsule  3   No current facility-administered medications for this visit.    Review of Systems Review of Systems  Constitutional: Negative for fever, chills and unexpected weight change.  HENT: Negative for congestion, hearing loss, sore throat, trouble swallowing and  voice change.   Eyes: Negative for visual disturbance.  Respiratory: Negative for cough and wheezing.   Cardiovascular: Negative for chest pain, palpitations and leg swelling.  Gastrointestinal: Negative for nausea, vomiting, abdominal pain, diarrhea, constipation, blood in stool, abdominal distention and anal bleeding.       Hep C  Genitourinary: Negative for hematuria, vaginal bleeding and difficulty urinating.  Musculoskeletal: Negative for arthralgias.       See HPI   Skin: Negative for rash and wound.  Neurological: Negative for seizures, syncope and headaches.  Hematological: Negative for adenopathy. Does not bruise/bleed easily.  Psychiatric/Behavioral: Negative for confusion.    Blood pressure 142/82, pulse 84, temperature 97.6 F (36.4 C), temperature source Temporal, resp. rate 12, height 5\' 4"  (1.626 m), weight 114 lb (51.71 kg), last menstrual period 10/23/2002.  Physical Exam Physical Exam  Constitutional: She is oriented to person, place, and time. She appears well-developed and well-nourished.  HENT:  Head: Normocephalic and atraumatic.  Eyes: EOM are normal. Pupils are equal, round, and reactive to light.  Neck: Normal range of motion. Neck supple. No tracheal deviation present.  Cardiovascular: Normal rate and normal heart sounds.   Pulmonary/Chest: Effort normal and breath sounds normal. No stridor.  Abdominal: Soft. Bowel sounds are normal. She exhibits no distension. There is no tenderness. There is no rebound and no guarding.  Bilateral inguinal lymphadenopathy, 1 cm palpable lymph node right groin with no tenderness, 2 8 mm lymph nodes palpable in the left groin without tenderness  Musculoskeletal: Normal range of motion.  Neurological: She is alert and oriented to person, place, and time.  Skin: Skin is warm and dry.  Psychiatric: She has a normal mood and affect.    Data Reviewed Office notes  Assessment    Bilateral inguinal lymphadenopathy, right  side more symptomatic     Plan    Operating room with the biopsy for further evaluation. We discussed possible outcome including inflammatory changes, lymphoma, or other cancer. She is concerned about this and eager to proceed with scheduling. We discussed the procedure and risks as well as benefits. She is agreeable.       Zenovia Jarred 01/20/2014, 10:46 AM

## 2014-02-04 ENCOUNTER — Encounter (HOSPITAL_BASED_OUTPATIENT_CLINIC_OR_DEPARTMENT_OTHER): Payer: Self-pay | Admitting: General Surgery

## 2014-02-05 ENCOUNTER — Telehealth (INDEPENDENT_AMBULATORY_CARE_PROVIDER_SITE_OTHER): Payer: Self-pay | Admitting: General Surgery

## 2014-02-05 NOTE — Telephone Encounter (Signed)
Discussed pathology

## 2014-02-10 ENCOUNTER — Ambulatory Visit (INDEPENDENT_AMBULATORY_CARE_PROVIDER_SITE_OTHER): Payer: Medicare Other | Admitting: General Surgery

## 2014-02-10 ENCOUNTER — Encounter: Payer: Self-pay | Admitting: Internal Medicine

## 2014-02-10 ENCOUNTER — Encounter (INDEPENDENT_AMBULATORY_CARE_PROVIDER_SITE_OTHER): Payer: Self-pay | Admitting: General Surgery

## 2014-02-10 VITALS — BP 128/82 | HR 78 | Temp 97.9°F | Resp 16 | Ht 64.0 in | Wt 123.0 lb

## 2014-02-10 DIAGNOSIS — R599 Enlarged lymph nodes, unspecified: Secondary | ICD-10-CM

## 2014-02-10 DIAGNOSIS — R590 Localized enlarged lymph nodes: Secondary | ICD-10-CM

## 2014-02-10 NOTE — Progress Notes (Signed)
Subjective:     Patient ID: Amanda Bennett, female   DOB: 1955/10/05, 58 y.o.   MRN: [id number]  HPI Patient status post right inguinal lymph node biopsy. Pathology was benign. She has developed some swelling in the area. She has mild associated discomfort.  Review of Systems     Objective:   Physical Exam Incision is intact but she has a small seroma. No evidence of infection. This was drained after prepping the area sterilely. 30 cc of clear fluid was removed. She tolerated this well.    Assessment:     Status post right inguinal lymph node biopsy, seroma    Plan:     This was aspirated as above, we'll see her back in a couple weeks for followup.

## 2014-02-13 ENCOUNTER — Emergency Department (HOSPITAL_COMMUNITY): Payer: Medicare Other

## 2014-02-13 ENCOUNTER — Telehealth (INDEPENDENT_AMBULATORY_CARE_PROVIDER_SITE_OTHER): Payer: Self-pay | Admitting: Surgery

## 2014-02-13 ENCOUNTER — Inpatient Hospital Stay (HOSPITAL_COMMUNITY)
Admission: EM | Admit: 2014-02-13 | Discharge: 2014-02-16 | DRG: 863 | Disposition: A | Discharge status: Home / Self Care | Payer: Medicare Other | Attending: General Surgery | Admitting: General Surgery

## 2014-02-13 ENCOUNTER — Encounter (HOSPITAL_COMMUNITY): Payer: Self-pay | Admitting: Emergency Medicine

## 2014-02-13 DIAGNOSIS — D72829 Elevated white blood cell count, unspecified: Secondary | ICD-10-CM

## 2014-02-13 DIAGNOSIS — M1610 Unilateral primary osteoarthritis, unspecified hip: Secondary | ICD-10-CM | POA: Diagnosis present

## 2014-02-13 DIAGNOSIS — Y849 Medical procedure, unspecified as the cause of abnormal reaction of the patient, or of later complication, without mention of misadventure at the time of the procedure: Secondary | ICD-10-CM | POA: Diagnosis present

## 2014-02-13 DIAGNOSIS — F329 Major depressive disorder, single episode, unspecified: Secondary | ICD-10-CM | POA: Diagnosis present

## 2014-02-13 DIAGNOSIS — I959 Hypotension, unspecified: Secondary | ICD-10-CM

## 2014-02-13 DIAGNOSIS — M810 Age-related osteoporosis without current pathological fracture: Secondary | ICD-10-CM | POA: Diagnosis present

## 2014-02-13 DIAGNOSIS — T8140XA Infection following a procedure, unspecified, initial encounter: Principal | ICD-10-CM | POA: Diagnosis present

## 2014-02-13 DIAGNOSIS — T8149XA Infection following a procedure, other surgical site, initial encounter: Secondary | ICD-10-CM | POA: Diagnosis present

## 2014-02-13 DIAGNOSIS — G4700 Insomnia, unspecified: Secondary | ICD-10-CM | POA: Diagnosis present

## 2014-02-13 DIAGNOSIS — L089 Local infection of the skin and subcutaneous tissue, unspecified: Secondary | ICD-10-CM

## 2014-02-13 DIAGNOSIS — K219 Gastro-esophageal reflux disease without esophagitis: Secondary | ICD-10-CM | POA: Diagnosis present

## 2014-02-13 DIAGNOSIS — F17200 Nicotine dependence, unspecified, uncomplicated: Secondary | ICD-10-CM | POA: Diagnosis present

## 2014-02-13 DIAGNOSIS — F411 Generalized anxiety disorder: Secondary | ICD-10-CM | POA: Diagnosis present

## 2014-02-13 DIAGNOSIS — G894 Chronic pain syndrome: Secondary | ICD-10-CM | POA: Diagnosis present

## 2014-02-13 DIAGNOSIS — T148XXA Other injury of unspecified body region, initial encounter: Secondary | ICD-10-CM

## 2014-02-13 DIAGNOSIS — B1920 Unspecified viral hepatitis C without hepatic coma: Secondary | ICD-10-CM | POA: Diagnosis present

## 2014-02-13 DIAGNOSIS — F3289 Other specified depressive episodes: Secondary | ICD-10-CM | POA: Diagnosis present

## 2014-02-13 DIAGNOSIS — L03311 Cellulitis of abdominal wall: Secondary | ICD-10-CM

## 2014-02-13 LAB — COMPREHENSIVE METABOLIC PANEL
ALT: 34 U/L (ref 0–35)
AST: 34 U/L (ref 0–37)
Albumin: 4 g/dL (ref 3.5–5.2)
Alkaline Phosphatase: 147 U/L — ABNORMAL HIGH (ref 39–117)
BUN: 12 mg/dL (ref 6–23)
CO2: 24 mEq/L (ref 19–32)
Calcium: 9.4 mg/dL (ref 8.4–10.5)
Chloride: 94 mEq/L — ABNORMAL LOW (ref 96–112)
Creatinine, Ser: 0.5 mg/dL (ref 0.50–1.10)
GFR calc Af Amer: >90 mL/min (ref >90)
GFR calc non Af Amer: >90 mL/min (ref >90)
Glucose, Bld: 91 mg/dL (ref 70–99)
Potassium: 4.1 mEq/L (ref 3.7–5.3)
Sodium: 132 mEq/L — ABNORMAL LOW (ref 137–147)
Total Bilirubin: 0.5 mg/dL (ref 0.3–1.2)
Total Protein: 7.9 g/dL (ref 6.0–8.3)

## 2014-02-13 LAB — CBC WITH DIFFERENTIAL/PLATELET
Basophils Absolute: 0 10*3/uL (ref 0.0–0.1)
Basophils Relative: 0 % (ref 0–1)
Eosinophils Absolute: 0.2 10*3/uL (ref 0.0–0.7)
Eosinophils Relative: 2 % (ref 0–5)
HCT: 38.6 % (ref 36.0–46.0)
Hemoglobin: 13.7 g/dL (ref 12.0–15.0)
Lymphocytes Relative: 10 % — ABNORMAL LOW (ref 12–46)
Lymphs Abs: 1.5 10*3/uL (ref 0.7–4.0)
MCH: 33.3 pg (ref 26.0–34.0)
MCHC: 35.5 g/dL (ref 30.0–36.0)
MCV: 93.7 fL (ref 78.0–100.0)
Monocytes Absolute: 1 10*3/uL (ref 0.1–1.0)
Monocytes Relative: 7 % (ref 3–12)
Neutro Abs: 12.9 10*3/uL — ABNORMAL HIGH (ref 1.7–7.7)
Neutrophils Relative %: 81 % — ABNORMAL HIGH (ref 43–77)
Platelets: 246 10*3/uL (ref 150–400)
RBC: 4.12 MIL/uL (ref 3.87–5.11)
RDW: 13.9 % (ref 11.5–15.5)
WBC: 15.7 10*3/uL — ABNORMAL HIGH (ref 4.0–10.5)

## 2014-02-13 LAB — URINALYSIS, ROUTINE W REFLEX MICROSCOPIC
Bilirubin Urine: NEGATIVE
Glucose, UA: NEGATIVE mg/dL
Hgb urine dipstick: NEGATIVE
Ketones, ur: NEGATIVE mg/dL
Nitrite: NEGATIVE
Protein, ur: NEGATIVE mg/dL
Specific Gravity, Urine: 1.022 (ref 1.005–1.030)
Urobilinogen, UA: 1 mg/dL (ref 0.0–1.0)
pH: 7 (ref 5.0–8.0)

## 2014-02-13 LAB — URINE MICROSCOPIC-ADD ON

## 2014-02-13 LAB — I-STAT CG4 LACTIC ACID, ED: Lactic Acid, Venous: 0.96 mmol/L (ref 0.5–2.2)

## 2014-02-13 MED ORDER — VANCOMYCIN HCL IN DEXTROSE 750-5 MG/150ML-% IV SOLN
750.0000 mg | Freq: Two times a day (BID) | INTRAVENOUS | Status: DC
  Administered 2014-02-13 – 2014-02-15 (×4): 750 mg via INTRAVENOUS
  Filled 2014-02-13 (×7): qty 150

## 2014-02-13 MED ORDER — PANTOPRAZOLE SODIUM 40 MG PO TBEC
40.0000 mg | DELAYED_RELEASE_TABLET | Freq: Every day | ORAL | Status: DC
  Administered 2014-02-14 – 2014-02-16 (×3): 40 mg via ORAL
  Filled 2014-02-13 (×3): qty 1

## 2014-02-13 MED ORDER — HYDROMORPHONE HCL PF 1 MG/ML IJ SOLN
1.0000 mg | INTRAMUSCULAR | Status: DC | PRN
  Administered 2014-02-13 – 2014-02-16 (×15): 1 mg via INTRAVENOUS
  Filled 2014-02-13 (×16): qty 1

## 2014-02-13 MED ORDER — AMLODIPINE BESYLATE 5 MG PO TABS
5.0000 mg | ORAL_TABLET | Freq: Every day | ORAL | Status: DC
  Administered 2014-02-14 – 2014-02-16 (×3): 5 mg via ORAL
  Filled 2014-02-13 (×3): qty 1

## 2014-02-13 MED ORDER — OXYCODONE HCL 5 MG PO TABS
5.0000 mg | ORAL_TABLET | ORAL | Status: DC | PRN
  Administered 2014-02-13 – 2014-02-16 (×13): 5 mg via ORAL
  Filled 2014-02-13 (×13): qty 1

## 2014-02-13 MED ORDER — SODIUM CHLORIDE 0.9 % IJ SOLN: 3.0000 mL | INTRAMUSCULAR | Status: DC | PRN

## 2014-02-13 MED ORDER — ENOXAPARIN SODIUM 40 MG/0.4ML ~~LOC~~ SOLN
40.0000 mg | SUBCUTANEOUS | Status: DC
  Administered 2014-02-13 – 2014-02-15 (×3): 40 mg via SUBCUTANEOUS
  Filled 2014-02-13 (×4): qty 0.4

## 2014-02-13 MED ORDER — SODIUM CHLORIDE 0.9 % IJ SOLN
3.0000 mL | Freq: Two times a day (BID) | INTRAMUSCULAR | Status: DC
  Administered 2014-02-14 – 2014-02-16 (×2): 3 mL via INTRAVENOUS

## 2014-02-13 MED ORDER — ALBUTEROL SULFATE (2.5 MG/3ML) 0.083% IN NEBU: 3.0000 mL | INHALATION_SOLUTION | RESPIRATORY_TRACT | Status: DC | PRN

## 2014-02-13 MED ORDER — SODIUM CHLORIDE 0.9 % IV SOLN
INTRAVENOUS | Status: DC
Start: 2014-02-13 — End: 2014-02-16
  Administered 2014-02-14 – 2014-02-15 (×3): via INTRAVENOUS

## 2014-02-13 MED ORDER — PIPERACILLIN-TAZOBACTAM 3.375 G IVPB
3.3750 g | Freq: Three times a day (TID) | INTRAVENOUS | Status: DC
  Administered 2014-02-13 – 2014-02-15 (×6): 3.375 g via INTRAVENOUS
  Filled 2014-02-13 (×8): qty 50

## 2014-02-13 MED ORDER — CITALOPRAM HYDROBROMIDE 40 MG PO TABS
40.0000 mg | ORAL_TABLET | Freq: Every day | ORAL | Status: DC
  Administered 2014-02-13 – 2014-02-16 (×4): 40 mg via ORAL
  Filled 2014-02-13 (×3): qty 1
  Filled 2014-02-13: qty 4

## 2014-02-13 MED ORDER — PIPERACILLIN-TAZOBACTAM 3.375 G IVPB 30 MIN
3.3750 g | Freq: Once | INTRAVENOUS | Status: AC
  Administered 2014-02-13: 3.375 g via INTRAVENOUS
  Filled 2014-02-13: qty 50

## 2014-02-13 MED ORDER — IBUPROFEN 200 MG PO TABS
200.0000 mg | ORAL_TABLET | Freq: Four times a day (QID) | ORAL | Status: DC | PRN
  Administered 2014-02-14: 600 mg via ORAL
  Filled 2014-02-13: qty 3

## 2014-02-13 MED ORDER — ONDANSETRON HCL 4 MG/2ML IJ SOLN
4.0000 mg | Freq: Four times a day (QID) | INTRAMUSCULAR | Status: DC | PRN
  Administered 2014-02-14 (×2): 4 mg via INTRAVENOUS
  Filled 2014-02-13 (×3): qty 2

## 2014-02-13 MED ORDER — VANCOMYCIN HCL IN DEXTROSE 1-5 GM/200ML-% IV SOLN
1000.0000 mg | Freq: Once | INTRAVENOUS | Status: AC
  Administered 2014-02-13: 1000 mg via INTRAVENOUS
  Filled 2014-02-13: qty 200

## 2014-02-13 MED ORDER — GABAPENTIN 300 MG PO CAPS
600.0000 mg | ORAL_CAPSULE | Freq: Four times a day (QID) | ORAL | Status: DC
  Administered 2014-02-13 – 2014-02-16 (×12): 600 mg via ORAL
  Filled 2014-02-13 (×16): qty 2

## 2014-02-13 MED ORDER — ONDANSETRON HCL 4 MG/2ML IJ SOLN
4.0000 mg | Freq: Once | INTRAMUSCULAR | Status: AC
  Administered 2014-02-13: 4 mg via INTRAVENOUS
  Filled 2014-02-13: qty 2

## 2014-02-13 MED ORDER — SODIUM CHLORIDE 0.9 % IV SOLN: 1000.0000 mL | INTRAVENOUS | Status: DC

## 2014-02-13 MED ORDER — SODIUM CHLORIDE 0.9 % IV SOLN
1000.0000 mL | Freq: Once | INTRAVENOUS | Status: AC
  Administered 2014-02-13: 1000 mL via INTRAVENOUS

## 2014-02-13 MED ORDER — SODIUM CHLORIDE 0.9 % IV SOLN
1000.0000 mL | Freq: Once | INTRAVENOUS | Status: AC
Start: 2014-02-13 — End: 2014-02-13
  Administered 2014-02-13: 1000 mL via INTRAVENOUS

## 2014-02-13 MED ORDER — HYDROMORPHONE HCL PF 1 MG/ML IJ SOLN
0.5000 mg | Freq: Once | INTRAMUSCULAR | Status: AC
  Administered 2014-02-13: 0.5 mg via INTRAVENOUS
  Filled 2014-02-13: qty 1

## 2014-02-13 MED ORDER — ZOLPIDEM TARTRATE 5 MG PO TABS
5.0000 mg | ORAL_TABLET | Freq: Every evening | ORAL | Status: DC | PRN
  Administered 2014-02-13 – 2014-02-15 (×3): 5 mg via ORAL
  Filled 2014-02-13 (×3): qty 1

## 2014-02-13 NOTE — ED Notes (Signed)
NS AT 125 HANGING. Computer issue.

## 2014-02-13 NOTE — H&P (Signed)
I have seen and examined the patient and agree with the assessment and plans. Will start wound care and IV antibiotics  Daytona Retana A. Ninfa Linden  MD, FACS

## 2014-02-13 NOTE — ED Notes (Signed)
MD at bedside. Admitting  

## 2014-02-13 NOTE — ED Notes (Signed)
Pt presents with c/o of fluid draining from right groin. On May 27th pt had lymph node biopsy done to rule out lymphoma. Biopsy negative. However incision site began swelling. Pt went to [facility] and had it drained. From that point on incision has been draining purulent fluid. Pt is wearing husbands colostomy bag, reports changing it more than 12 times last night. Also reports fever 102 and took 6 ibuprofen at 0900. SOB,  Chills, diaphoresis, nausea and vomiting. Denies Diarrhea. A/O x3 Vitals stable.

## 2014-02-13 NOTE — H&P (Signed)
Chief Complaint: post operative wound infection, right inguinal region HPI: Amanda Bennett underwent a right inguinal lymph node biopsy with Dr. Grandville Silos on 02/03/14.  She was seen in clinic on the 29th and had a seroma aspirated at which time there was no infection.  The patient reports about 2 days ago she developed pain and erythema.  Then while in the shower she had "an explosion" of blood and pus which continued to drain.  She applied a colostomy appliance to help with drainage.  She endorses temperature of 102, chills, sweats.  Denies weakness, sob, chest pains.  Appetite has been decreased.    Past Medical History  Diagnosis Date  . Foot fracture, right 07/2010    per x-ray 07/2010 - oblique comminuted fifth metatarsal fracture, followed by Dr. Lorelei Pont  . Depression   . Insomnia   . GERD (gastroesophageal reflux disease)   . Carpal tunnel syndrome   . Cervical spinal stenosis 2008    per x-ray findings 2008  . Pulmonary nodule, right     RUL nodule, 7 mm noted on Chest XRAY - 06/2008, per CT follow up in 2009 - No suspicious pulmonary nodules or masse noted  . Cervicalgia   . Pain in limb   . Lumbago   . Chronic pain syndrome   . Cervical syndrome   . Primary localized osteoarthrosis, pelvic region and thigh   . Lesion of ulnar nerve   . Anxiety   . Osteoporosis   . Unexplained weight loss   . Visual disturbance   . Cough   . Arthritis pain   . Bruises easily   . Wound disruption, post-op, skin   . PONV (postoperative nausea and vomiting)     shortness of breath   . Hepatitis     hx of Hep C   . Wears glasses   . Dysrhythmia     prolonger QT    Past Surgical History  Procedure Laterality Date  . Abdominal hysterectomy    . Finger amputated      right ring  . Drain in left ring finger      cat bite  . Broken clavical    . Cholecystectomy    . Hemorrhoid surgery  11/25/2006  . Cholecystectomy  10/30/2011    Procedure: LAPAROSCOPIC CHOLECYSTECTOMY WITH  INTRAOPERATIVE CHOLANGIOGRAM;  Surgeon: Imogene Burn. Georgette Dover, MD;  Location: [facility];  Service: General;  Laterality: N/A;  . Carpal tunnel release      right and left  . Colonoscopy    . Inguinal lymph node biopsy Right 02/03/2014    Procedure: INGUINAL LYMPH NODE BIOPSY;  Surgeon: Zenovia Jarred, MD;  Location: [place];  Service: General;  Laterality: Right;  right inguinal area    Family History  Problem Relation Age of Onset  . Stroke Neg Hx   . Stomach cancer Neg Hx   . Cancer Father     colon/prostate  . Colon cancer Father 28    deceased at age 62 from colon cancer  . Cancer Paternal Grandmother     ovarian   Social History:  reports that she has been smoking Cigarettes.  She has a 2 pack-year smoking history. She has never used smokeless tobacco. She reports that she does not drink alcohol or use illicit drugs.  Allergies:  Allergies  Allergen Reactions  . Morphine And Related Shortness Of Breath and Itching  . Prozac [Fluoxetine Hcl] Other (See Comments)    depression  . Tylenol [  Acetaminophen] Other (See Comments)    Liver Failure    Medication Scheduled Meds: . enoxaparin (LOVENOX) injection  40 mg Subcutaneous Q24H  . sodium chloride  3 mL Intravenous Q12H  . vancomycin  750 mg Intravenous Q12H   Continuous Infusions: . sodium chloride    . piperacillin-tazobactam (ZOSYN)  IV     PRN Meds:.HYDROmorphone (DILAUDID) injection, ondansetron, oxyCODONE, sodium chloride   (Not in a hospital admission)  Results for orders placed during the hospital encounter of 02/13/14 (from the past 48 hour(s))  CBC WITH DIFFERENTIAL     Status: Abnormal   Collection Time    02/13/14 10:35 AM      Result Value Ref Range   WBC 15.7 (*) 4.0 - 10.5 K/uL   RBC 4.12  3.87 - 5.11 MIL/uL   Hemoglobin 13.7  12.0 - 15.0 g/dL   HCT 38.6  36.0 - 46.0 %   MCV 93.7  78.0 - 100.0 fL   MCH 33.3  26.0 - 34.0 pg   MCHC 35.5  30.0 - 36.0 g/dL   RDW 13.9  11.5 - 15.5 %    Platelets 246  150 - 400 K/uL   Neutrophils Relative % 81 (*) 43 - 77 %   Neutro Abs 12.9 (*) 1.7 - 7.7 K/uL   Lymphocytes Relative 10 (*) 12 - 46 %   Lymphs Abs 1.5  0.7 - 4.0 K/uL   Monocytes Relative 7  3 - 12 %   Monocytes Absolute 1.0  0.1 - 1.0 K/uL   Eosinophils Relative 2  0 - 5 %   Eosinophils Absolute 0.2  0.0 - 0.7 K/uL   Basophils Relative 0  0 - 1 %   Basophils Absolute 0.0  0.0 - 0.1 K/uL  COMPREHENSIVE METABOLIC PANEL     Status: Abnormal   Collection Time    02/13/14 10:35 AM      Result Value Ref Range   Sodium 132 (*) 137 - 147 mEq/L   Potassium 4.1  3.7 - 5.3 mEq/L   Chloride 94 (*) 96 - 112 mEq/L   CO2 24  19 - 32 mEq/L   Glucose, Bld 91  70 - 99 mg/dL   BUN 12  6 - 23 mg/dL   Creatinine, Ser 0.50  0.50 - 1.10 mg/dL   Calcium 9.4  8.4 - 10.5 mg/dL   Total Protein 7.9  6.0 - 8.3 g/dL   Albumin 4.0  3.5 - 5.2 g/dL   AST 34  0 - 37 U/L   ALT 34  0 - 35 U/L   Alkaline Phosphatase 147 (*) 39 - 117 U/L   Total Bilirubin 0.5  0.3 - 1.2 mg/dL   GFR calc non Af Amer >90  >90 mL/min   GFR calc Af Amer >90  >90 mL/min   Comment: (NOTE)     The eGFR has been calculated using the CKD EPI equation.     This calculation has not been validated in all clinical situations.     eGFR's persistently <90 mL/min signify possible Chronic Kidney     Disease.  URINALYSIS, ROUTINE W REFLEX MICROSCOPIC     Status: Abnormal   Collection Time    02/13/14 10:45 AM      Result Value Ref Range   Color, Urine AMBER (*) YELLOW   Comment: BIOCHEMICALS MAY BE AFFECTED BY COLOR   APPearance HAZY (*) CLEAR   Specific Gravity, Urine 1.022  1.005 - 1.030   pH 7.0  5.0 - 8.0   Glucose, UA NEGATIVE  NEGATIVE mg/dL   Hgb urine dipstick NEGATIVE  NEGATIVE   Bilirubin Urine NEGATIVE  NEGATIVE   Ketones, ur NEGATIVE  NEGATIVE mg/dL   Protein, ur NEGATIVE  NEGATIVE mg/dL   Urobilinogen, UA 1.0  0.0 - 1.0 mg/dL   Nitrite NEGATIVE  NEGATIVE   Leukocytes, UA TRACE (*) NEGATIVE  URINE  MICROSCOPIC-ADD ON     Status: Abnormal   Collection Time    02/13/14 10:45 AM      Result Value Ref Range   Squamous Epithelial / LPF MANY (*) RARE   WBC, UA 0-2  <3 WBC/hpf   Bacteria, UA FEW (*) RARE  I-STAT CG4 LACTIC ACID, ED     Status: None   Collection Time    02/13/14 11:09 AM      Result Value Ref Range   Lactic Acid, Venous 0.96  0.5 - 2.2 mmol/L   Dg Chest Port 1 View  02/13/2014   CLINICAL DATA:  Postop.  Sepsis.  EXAM: PORTABLE CHEST - 1 VIEW  COMPARISON:  12/26/2013  FINDINGS: Numerous leads and wires project over the chest. Apical lordotic patient positioning. Remote right rib trauma. Midline trachea. Normal heart size. No pleural effusion or pneumothorax. Clear lungs.  IMPRESSION: No acute cardiopulmonary disease.   Electronically Signed   By: Abigail Miyamoto M.D.   On: 02/13/2014 11:29    Review of Systems  All other systems reviewed and are negative.   Blood pressure 108/71, pulse 64, temperature 97.8 F (36.6 C), temperature source Oral, resp. rate 13, last menstrual period 10/23/2002, SpO2 99.00%. Physical Exam  Constitutional: She is oriented to person, place, and time. She appears well-developed and well-nourished. No distress.  Cardiovascular: Normal rate, regular rhythm, normal heart sounds and intact distal pulses.  Exam reveals no gallop and no friction rub.   No murmur heard. Respiratory: Effort normal and breath sounds normal. No respiratory distress. She has no wheezes. She has no rales. She exhibits no tenderness.  GI: Soft. Bowel sounds are normal. She exhibits no distension and no mass. There is no tenderness. There is no rebound and no guarding.  Musculoskeletal: She exhibits no edema.  Neurological: She is alert and oriented to person, place, and time.  Skin: Skin is warm. She is not diaphoretic.  3.5x3.5cm wound, foul odor, purulent drainage with significant surrounding erythema which was outlined.    Psychiatric: She has a normal mood and affect. Her  behavior is normal. Judgment and thought content normal.     Assessment/Plan Post op wound infection, right inguinal region -admit for IV antibiotics -await culture -Vanc/zosyn or now, will narrow with cultures -BID wet to dry dressing changes -regular diet, ambulate -SCDs, lovenox -continue home meds -pain control   Emina Riebock  ANP-BC  02/13/2014, 11:38 AM

## 2014-02-13 NOTE — Telephone Encounter (Signed)
Ms. Clair had a right inguinal node biopsy on 02/03/2014 by Dr. Grandville Silos.  She had a seroma drained by Dr. Grandville Silos on 02/10/2014.  She now calls for drainage from the groin and fever.  She is going to the [facility] ER to be evaluated.  Alphonsa Overall, MD, [facility] Surgery Pager: [phone] Office phone:  [phone]

## 2014-02-13 NOTE — Progress Notes (Signed)
ANTIBIOTIC CONSULT NOTE - INITIAL  Pharmacy Consult for vanc/zosyn Indication: cellulitis  Allergies  Allergen Reactions  . Morphine And Related Shortness Of Breath and Itching  . Prozac [Fluoxetine Hcl] Other (See Comments)    depression  . Tylenol [Acetaminophen] Other (See Comments)    Liver Failure     Patient Measurements: Wt = 51 kg Adjusted Body Weight:   Vital Signs: Temp: 97.8 F (36.6 C) (06/06 0956) Temp src: Oral (06/06 0956) BP: 108/71 mmHg (06/06 1115) Pulse Rate: 64 (06/06 1115) Intake/Output from previous day:   Intake/Output from this shift:    Labs:  Recent Labs  02/13/14 1035  WBC 15.7*  HGB 13.7  PLT 246  CREATININE 0.50   The CrCl is unknown because both a height and weight (above a minimum accepted value) are required for this calculation. No results found for this basename: VANCOTROUGH, VANCOPEAK, VANCORANDOM, GENTTROUGH, GENTPEAK, GENTRANDOM, TOBRATROUGH, TOBRAPEAK, TOBRARND, AMIKACINPEAK, AMIKACINTROU, AMIKACIN,  in the last 72 hours   Microbiology: No results found for this or any previous visit (from the past 720 hour(s)).  Medical History: Past Medical History  Diagnosis Date  . Foot fracture, right 07/2010    per x-ray 07/2010 - oblique comminuted fifth metatarsal fracture, followed by Dr. Lorelei Pont  . Depression   . Insomnia   . GERD (gastroesophageal reflux disease)   . Carpal tunnel syndrome   . Cervical spinal stenosis 2008    per x-ray findings 2008  . Pulmonary nodule, right     RUL nodule, 7 mm noted on Chest XRAY - 06/2008, per CT follow up in 2009 - No suspicious pulmonary nodules or masse noted  . Cervicalgia   . Pain in limb   . Lumbago   . Chronic pain syndrome   . Cervical syndrome   . Primary localized osteoarthrosis, pelvic region and thigh   . Lesion of ulnar nerve   . Anxiety   . Osteoporosis   . Unexplained weight loss   . Visual disturbance   . Cough   . Arthritis pain   . Bruises easily   . Wound  disruption, post-op, skin   . PONV (postoperative nausea and vomiting)     shortness of breath   . Hepatitis     hx of Hep C   . Wears glasses   . Dysrhythmia     prolonger QT    Medications:   (Not in a hospital admission) Scheduled:  . ondansetron (ZOFRAN) IV  4 mg Intravenous Once   Infusions:  . sodium chloride    . vancomycin 1,000 mg (02/13/14 1037)   Assessment: 58 yo with a recent bx of bilateral inguinal lymphanopathy. Pt prestented today with drainage and fever. [facility] has ordered vanc/zosyn for empiric therapy.   Goal of Therapy:  Vancomycin trough level 10-15 mcg/ml  Plan:   Vanc 750mg  IV q12 Zosyn 3.375g IV q8 Trough if needed

## 2014-02-13 NOTE — ED Provider Notes (Signed)
CSN: [id number]     Arrival date & time 02/13/14  0941 History   First MD Initiated Contact with Patient 02/13/14 1006     Chief Complaint  Patient presents with  . Post-op Problem    Lymphnode biopsy on right groin. Draining pureulent fluid     (Consider location/radiation/quality/duration/timing/severity/associated sxs/prior Treatment) The history is provided by the patient.  pt states on 02/03/14 had swollen lymph node removed from right groin - subsequent path benign.  A few days post op had developed pain and swelling to wound, was seen by gen surgery and had 'seroma' drained.  Pt states since then progressive redness and swelling about wound, increased pain, increased yellowish drainage, and fevers to 102.  +chills, nausea and vomiting. No bloody or bilious emesis. Having normal bms. No dysuria or gu c/o.  No recent abx use.      Past Medical History  Diagnosis Date  . Foot fracture, right 07/2010    per x-ray 07/2010 - oblique comminuted fifth metatarsal fracture, followed by Dr. Lorelei Pont  . Depression   . Insomnia   . GERD (gastroesophageal reflux disease)   . Carpal tunnel syndrome   . Cervical spinal stenosis 2008    per x-ray findings 2008  . Pulmonary nodule, right     RUL nodule, 7 mm noted on Chest XRAY - 06/2008, per CT follow up in 2009 - No suspicious pulmonary nodules or masse noted  . Cervicalgia   . Pain in limb   . Lumbago   . Chronic pain syndrome   . Cervical syndrome   . Primary localized osteoarthrosis, pelvic region and thigh   . Lesion of ulnar nerve   . Anxiety   . Osteoporosis   . Unexplained weight loss   . Visual disturbance   . Cough   . Arthritis pain   . Bruises easily   . Wound disruption, post-op, skin   . PONV (postoperative nausea and vomiting)     shortness of breath   . Hepatitis     hx of Hep C   . Wears glasses   . Dysrhythmia     prolonger QT   Past Surgical History  Procedure Laterality Date  . Abdominal hysterectomy    .  Finger amputated      right ring  . Drain in left ring finger      cat bite  . Broken clavical    . Cholecystectomy    . Hemorrhoid surgery  11/25/2006  . Cholecystectomy  10/30/2011    Procedure: LAPAROSCOPIC CHOLECYSTECTOMY WITH INTRAOPERATIVE CHOLANGIOGRAM;  Surgeon: Imogene Burn. Georgette Dover, MD;  Location: [facility];  Service: General;  Laterality: N/A;  . Carpal tunnel release      right and left  . Colonoscopy    . Inguinal lymph node biopsy Right 02/03/2014    Procedure: INGUINAL LYMPH NODE BIOPSY;  Surgeon: Zenovia Jarred, MD;  Location: [place];  Service: General;  Laterality: Right;  right inguinal area   Family History  Problem Relation Age of Onset  . Stroke Neg Hx   . Stomach cancer Neg Hx   . Cancer Father     colon/prostate  . Colon cancer Father 64    deceased at age 27 from colon cancer  . Cancer Paternal Grandmother     ovarian   History  Substance Use Topics  . Smoking status: Light Tobacco Smoker -- 0.10 packs/day for 20 years    Types: Cigarettes    Last  Attempt to Quit: 01/08/2011  . Smokeless tobacco: Never Used     Comment: pack lasts about a week./ 1 PK EVERY 3 DAYS  . Alcohol Use: No   OB History   Grav Para Term Preterm Abortions TAB SAB Ect Mult Living                 Review of Systems  Constitutional: Negative for fever and chills.  HENT: Negative for sore throat.   Eyes: Negative for redness.  Respiratory: Negative for cough and shortness of breath.   Cardiovascular: Negative for chest pain.  Gastrointestinal: Positive for nausea and vomiting. Negative for abdominal pain, diarrhea and blood in stool.  Genitourinary: Negative for dysuria and flank pain.  Musculoskeletal: Negative for back pain and neck pain.  Skin: Negative for rash.  Neurological: Negative for headaches.  Hematological: Does not bruise/bleed easily.  Psychiatric/Behavioral: Negative for confusion.      Allergies  Morphine and related; Prozac; and  Tylenol  Home Medications   Prior to Admission medications   Medication Sig Start Date End Date Taking? Authorizing Provider  albuterol (PROVENTIL HFA;VENTOLIN HFA) 108 (90 BASE) MCG/ACT inhaler Inhale 1 puff into the lungs every 4 (four) hours as needed. Wheezing 02/18/13   Tonia Brooms, MD  amLODipine (NORVASC) 5 MG tablet Take 1 tablet (5 mg total) by mouth daily. 06/18/13 06/18/14  Hester Mates, MD  citalopram (CELEXA) 40 MG tablet Take 1 tablet (40 mg total) by mouth daily. 06/18/13   Hester Mates, MD  DULoxetine (CYMBALTA) 20 MG capsule Take 1 capsule (20 mg total) by mouth 2 (two) times daily. 12/25/11 09/15/13  Ansel Bong, MD  gabapentin (NEURONTIN) 300 MG capsule Take 2 capsules (600 mg total) by mouth 4 (four) times daily. 09/08/13 09/08/14  Lesly Dukes, MD  omeprazole (PRILOSEC) 20 MG capsule Take 1 capsule (20 mg total) by mouth daily. 12/30/13   Lesly Dukes, MD  oxyCODONE (ROXICODONE) 5 MG immediate release tablet Take 1 tablet (5 mg total) by mouth every 4 (four) hours as needed for severe pain (pain). 02/03/14   Zenovia Jarred, MD  traZODone (DESYREL) 25 mg TABS tablet Take 25 mg by mouth at bedtime.    Historical Provider, MD   BP 111/71  Pulse 73  Temp(Src) 97.8 F (36.6 C) (Oral)  Resp 10  SpO2 97%  LMP 10/23/2002 Physical Exam  Nursing note and vitals reviewed. Constitutional: She is oriented to person, place, and time. She appears distressed.  Pt uncomfortable appearing, hypotensive.   HENT:  Mouth/Throat: Oropharynx is clear and moist.  Eyes: Conjunctivae are normal. No scleral icterus.  Neck: Neck supple. No tracheal deviation present.  No stiffness or rigidity  Cardiovascular: Normal rate, regular rhythm, normal heart sounds and intact distal pulses.  Exam reveals no gallop and no friction rub.   No murmur heard. Pulmonary/Chest: Effort normal and breath sounds normal. No respiratory distress.  Abdominal: Soft. Normal appearance and bowel sounds are normal. She  exhibits no distension and no mass. There is tenderness. There is no rebound and no guarding.  Right groin incision open a couple mm, draining watery yellowish liquid into colostomy bag that pt has affixed to skin over wound.  Surrounding erythema extending to lower abd wall and down to perineum on right side. No crepitus. +tenderness.   Genitourinary:  No cva tenderness  Musculoskeletal: She exhibits no edema and no tenderness.  Neurological: She is alert and oriented to person, place, and time.  Skin: Skin  is warm and dry. No rash noted.  Psychiatric: She has a normal mood and affect.    ED Course  Procedures (including critical care time) Labs Review Labs Reviewed  CULTURE, BLOOD (ROUTINE X 2)  CULTURE, BLOOD (ROUTINE X 2)  URINE CULTURE  WOUND CULTURE  CBC WITH DIFFERENTIAL  COMPREHENSIVE METABOLIC PANEL  URINALYSIS, ROUTINE W REFLEX MICROSCOPIC  I-STAT CG4 LACTIC ACID, ED    Imaging Review No results found.   EKG Interpretation   Date/Time:  Saturday February 13 2014 10:26:31 EDT Ventricular Rate:  70 PR Interval:  165 QRS Duration: 100 QT Interval:  433 QTC Calculation: 467 R Axis:   69 Text Interpretation:  Sinus rhythm Normal ECG Confirmed by Ashok Cordia  MD,  Lennette Bihari ([id number]) on 02/13/2014 10:52:12 AM      MDM  Code sepsis order set initiated.  Iv ns boluses.  Monitor. Labs.  INCISION AND DRAINAGE Performed by: Mirna Mires Consent: Verbal consent obtained. Risks and benefits: risks, benefits and alternatives were discussed Type: abscess  Body area: right groin  Anesthesia: local infiltration  Prior incision already draining, bluntly opened further w hemostats  Local anesthetic: lidocaine 2% w epinephrine  Anesthetic total: 5 ml  Complexity: complex Blunt dissection to break up loculations  Drainage: purulent  Drainage amount: large  Packing material: 1/2 in iodoform gauze  Patient tolerance: Patient tolerated the procedure well with no immediate  complications.  Sterile dressing.  Reviewed nursing notes and prior charts for additional history.   gen surgery called - discussed pt with Dr Ninfa Linden - he will see in ED.  Additional ns boluses.  Cultures from blood and wound.  Iv vanc and zosyn.  bp improved. Dilaudid iv.    Recheck bp improved.  Pt feeling improved.  gen surgery to see/admit, and decide on possible imaging.  CRITICAL CARE  RE hypotension/sepsis, fevers, wound infection, cellulitis.  Performed by: Mirna Mires Total critical care time: 40 Critical care time was exclusive of separately billable procedures and treating other patients. Critical care was necessary to treat or prevent imminent or life-threatening deterioration. Critical care was time spent personally by me on the following activities: development of treatment plan with patient and/or surrogate as well as nursing, discussions with consultants, evaluation of patient's response to treatment, examination of patient, obtaining history from patient or surrogate, ordering and performing treatments and interventions, ordering and review of laboratory studies, ordering and review of radiographic studies, pulse oximetry and re-evaluation of patient's condition.      Mirna Mires, MD 02/13/14 1126

## 2014-02-14 LAB — CBC
HCT: 34.3 % — ABNORMAL LOW (ref 36.0–46.0)
Hemoglobin: 11.6 g/dL — ABNORMAL LOW (ref 12.0–15.0)
MCH: 32.7 pg (ref 26.0–34.0)
MCHC: 33.8 g/dL (ref 30.0–36.0)
MCV: 96.6 fL (ref 78.0–100.0)
Platelets: 210 10*3/uL (ref 150–400)
RBC: 3.55 MIL/uL — ABNORMAL LOW (ref 3.87–5.11)
RDW: 14.1 % (ref 11.5–15.5)
WBC: 8.5 10*3/uL (ref 4.0–10.5)

## 2014-02-14 LAB — URINE CULTURE
Colony Count: NO GROWTH
Culture: NO GROWTH

## 2014-02-14 NOTE — Progress Notes (Signed)
Patient ID: Amanda Bennett, female   DOB: 1955/11/02, 58 y.o.   MRN: [id number]    Subjective: Feels overall much better today. She has been having some discomfort in the right lower quadrant of her abdomen as well for several days.  Some nausea prior to admission but none currently.  Objective: Vital signs in last 24 hours: Temp:  [98 F (36.7 C)-100 F (37.8 C)] 98 F (36.7 C) (06/07 0635) Pulse Rate:  [61-82] 66 (06/07 0635) Resp:  [10-18] 16 (06/07 0635) BP: (92-114)/(39-96) 110/60 mmHg (06/07 0931) SpO2:  [92 %-100 %] 93 % (06/07 0635) Weight:  [125 lb (56.7 kg)] 125 lb (56.7 kg) (06/06 1430) Last BM Date: 02/13/14  Intake/Output from previous day: 06/06 0701 - 06/07 0700 In: 3128.8 [P.O.:960; I.V.:2168.8] Out: -  Intake/Output this shift: Total I/O In: 3 [I.V.:3] Out: -   General appearance: alert, cooperative and no distress GI: mild tenderness right midabdomen the right lower quadrant without guarding. Incision/Wound: groin wound erythema now  essentially completely resolved. Wound just repacked and appears clean.  Lab Results:   Recent Labs  02/13/14 1035 02/14/14 0405  WBC 15.7* 8.5  HGB 13.7 11.6*  HCT 38.6 34.3*  PLT 246 210   BMET  Recent Labs  02/13/14 1035  NA 132*  K 4.1  CL 94*  CO2 24  GLUCOSE 91  BUN 12  CREATININE 0.50  CALCIUM 9.4     Studies/Results: Dg Chest Port 1 View  02/13/2014   CLINICAL DATA:  Postop.  Sepsis.  EXAM: PORTABLE CHEST - 1 VIEW  COMPARISON:  12/26/2013  FINDINGS: Numerous leads and wires project over the chest. Apical lordotic patient positioning. Remote right rib trauma. Midline trachea. Normal heart size. No pleural effusion or pneumothorax. Clear lungs.  IMPRESSION: No acute cardiopulmonary disease.   Electronically Signed   By: Abigail Miyamoto M.D.   On: 02/13/2014 11:29    Anti-infectives: Anti-infectives   Start     Dose/Rate Route Frequency Ordered Stop   02/13/14 2300  vancomycin (VANCOCIN) IVPB 750 mg/150  ml premix     750 mg 150 mL/hr over 60 Minutes Intravenous Every 12 hours 02/13/14 1130     02/13/14 1800  piperacillin-tazobactam (ZOSYN) IVPB 3.375 g     3.375 g 12.5 mL/hr over 240 Minutes Intravenous Every 8 hours 02/13/14 1130     02/13/14 1030  piperacillin-tazobactam (ZOSYN) IVPB 3.375 g     3.375 g 100 mL/hr over 30 Minutes Intravenous  Once 02/13/14 1025 02/13/14 1115   02/13/14 1030  vancomycin (VANCOCIN) IVPB 1000 mg/200 mL premix     1,000 mg 200 mL/hr over 60 Minutes Intravenous  Once 02/13/14 1025 02/13/14 1137      Assessment/Plan: Wound infection right groin status post excisional lymph node biopsy. Appears much improved with white count normalized, afebrile and erythema essentially resolved. She has been having some right lower quadrant discomfort. I would think this is referred from her groin wound infection. Follow for now.    LOS: 1 day    Edward Jolly 02/14/2014

## 2014-02-15 ENCOUNTER — Telehealth (INDEPENDENT_AMBULATORY_CARE_PROVIDER_SITE_OTHER): Payer: Self-pay

## 2014-02-15 MED ORDER — CEPHALEXIN 500 MG PO CAPS
500.0000 mg | ORAL_CAPSULE | Freq: Four times a day (QID) | ORAL | Status: DC
  Administered 2014-02-15 – 2014-02-16 (×4): 500 mg via ORAL
  Filled 2014-02-15 (×7): qty 1

## 2014-02-15 NOTE — Telephone Encounter (Signed)
Unable to reach pt at home. Pt was admitted and still IP.

## 2014-02-15 NOTE — Consult Note (Signed)
WOC wound consult note Reason for Consult: Consult requested for right groin by CCS team who is following her for a lymphocele.  Requested to assist with drainage control. Wound type:  Right groin with full thickness wound Measurement: .2X1X1cm Wound bed: Unable to visualize wound bed R/T narrow opening. Drainage (amount, consistency, odor) Large amt yellow drainage, has required frequent dressing changes to control lymph fluid.   Periwound:Skin surrounding has medical adhesive related skin injury from frequent tape removal. Dressing procedure/placement/frequency: Wound packed with gauze strip to allow wicking.  Applied one piece ostomy pouch to contain drainage.  Pt familiar with pouch application since her boyfriend has a colostomy, she states.  If this is the desired plan of care, she will need a prescription for pouch ordering for her insurance company. Extra supplies at bedside for patient and staff use. Please re-consult if further assistance is needed.  Thank-you,  Julien Girt MSN, [place], [place], [place], [place]

## 2014-02-15 NOTE — Telephone Encounter (Signed)
Message copied by Dois Davenport on Mon Feb 15, 2014  9:53 AM ------      Message from: Maryclare Bean      Created: Mon Feb 15, 2014  9:23 AM      Regarding: FW: please call Monday                   ----- Message -----         From: Shann Medal, MD         Sent: 02/13/2014   9:25 AM           To: [facility] Clinical Pool      Subject: please call Monday                                       Please check on Ms. Temples.            She called over the weekend.            Thanks,            Shanon Brow ------

## 2014-02-15 NOTE — Progress Notes (Signed)
Will have drainage for awhile.  eakins pouch sometime is helpful.

## 2014-02-15 NOTE — Progress Notes (Signed)
  Subjective: She is very nervous and anxious. Complains of site aching.  It looks good, no swelling or erythema, just open biopsy site with clear serous drainage.  She says it had to be changed 9 times yesterday.  Objective: Vital signs in last 24 hours: Temp:  [97.7 F (36.5 C)-98.5 F (36.9 C)] 98.1 F (36.7 C) (06/08 0603) Pulse Rate:  [60-78] 60 (06/08 0603) Resp:  [17-18] 17 (06/08 0603) BP: (97-116)/(56-81) 107/56 mmHg (06/08 0603) SpO2:  [96 %-100 %] 100 % (06/08 0603) Last BM Date: 02/14/14 1200 PO Multiple BM's 9 recorded. Afebrile, VSS Labs this AM show normal WBC No growth on wound culture so far. No growth on urine culture No growth on blood culture so far. She has had 2 days of vancomycin and Zosyn Intake/Output from previous day: 06/07 0701 - 06/08 0700 In: 2703 [P.O.:1200; I.V.:1053; IV Piggyback:450] Out: -  Intake/Output this shift:    General appearance: alert, cooperative, no distress and very anxious Resp: clear to auscultation bilaterally GI: soft, non-tender; bowel sounds normal; no masses,  no organomegaly Skin: 2 cm biopsy site draining clear serous looking fluid.  Lab Results:   Recent Labs  02/13/14 1035 02/14/14 0405  WBC 15.7* 8.5  HGB 13.7 11.6*  HCT 38.6 34.3*  PLT 246 210    BMET  Recent Labs  02/13/14 1035  NA 132*  K 4.1  CL 94*  CO2 24  GLUCOSE 91  BUN 12  CREATININE 0.50  CALCIUM 9.4   PT/INR No results found for this basename: LABPROT, INR,  in the last 72 hours   Recent Labs Lab 02/13/14 1035  AST 34  ALT 34  ALKPHOS 147*  BILITOT 0.5  PROT 7.9  ALBUMIN 4.0     Lipase     Component Value Date/Time   LIPASE 26 11/02/2008 1545     Studies/Results: Dg Chest Port 1 View  02/13/2014   CLINICAL DATA:  Postop.  Sepsis.  EXAM: PORTABLE CHEST - 1 VIEW  COMPARISON:  12/26/2013  FINDINGS: Numerous leads and wires project over the chest. Apical lordotic patient positioning. Remote right rib trauma. Midline  trachea. Normal heart size. No pleural effusion or pneumothorax. Clear lungs.  IMPRESSION: No acute cardiopulmonary disease.   Electronically Signed   By: Abigail Miyamoto M.D.   On: 02/13/2014 11:29    Medications: . amLODipine  5 mg Oral Daily  . citalopram  40 mg Oral Daily  . enoxaparin (LOVENOX) injection  40 mg Subcutaneous Q24H  . gabapentin  600 mg Oral QID  . pantoprazole  40 mg Oral Daily  . piperacillin-tazobactam (ZOSYN)  IV  3.375 g Intravenous Q8H  . sodium chloride  3 mL Intravenous Q12H  . vancomycin  750 mg Intravenous Q12H    Assessment/Plan 1.  Post op wound infection, right inguinal region after biopsy for lymphadenopathy, 02/03/14. Diagnosis Lymph node for lymphoma, Right inguinal - BENIGN LYMPH NODE. - SEE Tissue-Flow Cytometry - NO MONOCLONAL B-CELL OR PHENOTYPICALLY ABERRANT T-CELL POPULATION. 2.  Depression  3.  Cervicalgia/Chronic pain syndrome/Lumbago  4.  hx of Hep C  5.  Primary localized osteoarthrosis, pelvic region and thigh    Plan:  I will ask wound care to look at it and see if pouching is an option.  I think the infection is improved, but she has ongoing lymph drainage.      LOS: 2 days    Earnstine Regal 02/15/2014

## 2014-02-16 LAB — WOUND CULTURE: Gram Stain: NONE SEEN

## 2014-02-16 LAB — BASIC METABOLIC PANEL
BUN: 11 mg/dL (ref 6–23)
CO2: 28 mEq/L (ref 19–32)
Calcium: 9.4 mg/dL (ref 8.4–10.5)
Chloride: 98 mEq/L (ref 96–112)
Creatinine, Ser: 0.49 mg/dL — ABNORMAL LOW (ref 0.50–1.10)
GFR calc Af Amer: >90 mL/min (ref >90)
GFR calc non Af Amer: >90 mL/min (ref >90)
Glucose, Bld: 87 mg/dL (ref 70–99)
Potassium: 4 mEq/L (ref 3.7–5.3)
Sodium: 139 mEq/L (ref 137–147)

## 2014-02-16 LAB — CLOSTRIDIUM DIFFICILE BY PCR: Toxigenic C. Difficile by PCR: NEGATIVE

## 2014-02-16 MED ORDER — CHOLESTYRAMINE 4 G PO PACK: 4.0000 g | PACK | Freq: Three times a day (TID) | ORAL | Status: DC

## 2014-02-16 MED ORDER — DOXYCYCLINE HYCLATE 100 MG PO CAPS: 100.0000 mg | ORAL_CAPSULE | Freq: Two times a day (BID) | ORAL | Status: DC

## 2014-02-16 MED ORDER — OXYCODONE HCL 5 MG PO TABS: 5.0000 mg | ORAL_TABLET | Freq: Four times a day (QID) | ORAL | Status: DC | PRN

## 2014-02-16 NOTE — Consult Note (Signed)
WOC follow-up: Pouch intact to right groin with good seal.  Mod amt yellow drainage.  Pt is able to empty and close without assistance.  She states she is familiar with ordering ostomy supplies for her boyfriend who has a colostomy, but will need a prescription for Urostomy pouches.  6 free samples given to pt at bedside, these will be easier to empty since they have a spout. She states she feels comfortable with pouch application and does not require further assistance or questions.  Progress notes indicate [facility] team plans to remove pouch today prior to discharge and assess if there is a need for packing.  Refer to their progress notes for desired plan of care. Please re-consult if further assistance is needed.  Thank-you,  Julien Girt MSN, [place], [place], [place], [place]

## 2014-02-16 NOTE — Progress Notes (Signed)
  Subjective: Has some diarrhea she attributes to the ABX. Overall, feels much better.  Objective: Vital signs in last 24 hours: Temp:  [97.6 F (36.4 C)-98.1 F (36.7 C)] 97.6 F (36.4 C) (06/09 0517) Pulse Rate:  [57-70] 57 (06/09 0517) Resp:  [16] 16 (06/09 0517) BP: (124-142)/(64-79) 139/77 mmHg (06/09 0517) SpO2:  [98 %-100 %] 99 % (06/09 0517) Last BM Date: 02/15/14  Intake/Output from previous day:   Intake/Output this shift:    General appearance: alert and cooperative Resp: clear to auscultation bilaterally Cardio: regular rate and rhythm GI: soft, NT, ostomy appliance on LN BX site with clear serous drainage and no surrounding cellulitis  Lab Results:   Recent Labs  02/13/14 1035 02/14/14 0405  WBC 15.7* 8.5  HGB 13.7 11.6*  HCT 38.6 34.3*  PLT 246 210   BMET  Recent Labs  02/13/14 1035 02/16/14 0344  NA 132* 139  K 4.1 4.0  CL 94* 98  CO2 24 28  GLUCOSE 91 87  BUN 12 11  CREATININE 0.50 0.49*  CALCIUM 9.4 9.4   PT/INR No results found for this basename: LABPROT, INR,  in the last 72 hours ABG No results found for this basename: PHART, PCO2, PO2, HCO3,  in the last 72 hours  Studies/Results: No results found.  Anti-infectives: Anti-infectives   Start     Dose/Rate Route Frequency Ordered Stop   02/15/14 1800  cephALEXin (KEFLEX) capsule 500 mg     500 mg Oral 4 times per day 02/15/14 1446     02/13/14 2300  vancomycin (VANCOCIN) IVPB 750 mg/150 ml premix  Status:  Discontinued     750 mg 150 mL/hr over 60 Minutes Intravenous Every 12 hours 02/13/14 1130 02/15/14 1444   02/13/14 1800  piperacillin-tazobactam (ZOSYN) IVPB 3.375 g  Status:  Discontinued     3.375 g 12.5 mL/hr over 240 Minutes Intravenous Every 8 hours 02/13/14 1130 02/15/14 1444   02/13/14 1030  piperacillin-tazobactam (ZOSYN) IVPB 3.375 g     3.375 g 100 mL/hr over 30 Minutes Intravenous  Once 02/13/14 1025 02/13/14 1115   02/13/14 1030  vancomycin (VANCOCIN) IVPB  1000 mg/200 mL premix     1,000 mg 200 mL/hr over 60 Minutes Intravenous  Once 02/13/14 1025 02/13/14 1137      Assessment/Plan: S/P LN BX with post-op lymph leak - ostomy appliance working well. Will change this PM and remove packing. Plan to D/C this PM. ID - Keflex VTE - Lovenox  LOS: 3 days    Amanda Bennett 02/16/2014

## 2014-02-16 NOTE — Discharge Summary (Addendum)
Physician Discharge Summary  Patient ID: Amanda Bennett MRN: [id number] DOB/AGE: 56- Feb-1957 56 y.o.  Admit date: 02/13/2014 Discharge date: 02/16/2014  Admission Diagnoses:Wound infection status post right inguinal lymph node biopsy  Discharge Diagnoses: Wound infection status post right inguinal lymph node biopsy, lymph leak Active Problems:   Postoperative wound infection   Discharged Condition: good  Hospital Course: Patient is status post right inguinal lymph node biopsy. She developed a seroma there which was drained in our office. Several days later she presented to the emergency department with worsening pain and drainage. She was admitted and placed on IV antibiotics. She has a lymphatic leak. This was controlled with an ostomy appliance. Cultures grew OSSA and she will be sent home on doxycycline. Pain is well-controlled and cellulitis resolved. She also requests Questran at home that she is having some diarrhea. C. Difficile is negative. She is discharged home in stable condition. She's feels comfortable with wound care plan.  Consults: None  Discharge Exam: Blood pressure 139/77, pulse 57, temperature 97.6 F (36.4 C), temperature source Oral, resp. rate 16, height 5\' 4"  (1.626 m), weight 125 lb (56.7 kg), last menstrual period 10/23/2002, SpO2 99.00%. Incision/Wound:see progress notes  Disposition: 01-Home or Self Care  Medications:  Questran 1 packet 3 times a day with meals Oxycodone 5-10 mg by mouth every 6 hours when necessary pain Doxycycline 100 mg by mouth twice a day for 10 days Resume other home medications     Follow-up Information   Follow up with Zenovia Jarred, MD. (As scheduled)    Specialty:  General Surgery   Contact information:   [address] [place] [address] [phone]       Signed: Zenovia Jarred 02/16/2014, 1:03 PM

## 2014-02-16 NOTE — Progress Notes (Signed)
Patient ID: Amanda Bennett, female   DOB: 05-31-1956, 58 y.o.   MRN: [id number] Cx shows OSSA. Will treat with Doxycycline. She also requests Questran at home. C diff neg. Will D/C. Georganna Skeans, MD, MPH, FACS Trauma: [phone] General Surgery: [phone]

## 2014-02-19 LAB — CULTURE, BLOOD (ROUTINE X 2)
Culture: NO GROWTH
Culture: NO GROWTH

## 2014-03-03 ENCOUNTER — Encounter (INDEPENDENT_AMBULATORY_CARE_PROVIDER_SITE_OTHER): Payer: Medicare Other | Admitting: General Surgery

## 2014-04-17 ENCOUNTER — Emergency Department (HOSPITAL_COMMUNITY)
Admission: EM | Admit: 2014-04-17 | Discharge: 2014-04-17 | Disposition: A | Discharge status: Home / Self Care | Payer: Medicare Other | Attending: Emergency Medicine | Admitting: Emergency Medicine

## 2014-04-17 ENCOUNTER — Encounter (HOSPITAL_COMMUNITY): Payer: Self-pay | Admitting: Emergency Medicine

## 2014-04-17 ENCOUNTER — Emergency Department (HOSPITAL_COMMUNITY): Payer: Medicare Other

## 2014-04-17 DIAGNOSIS — Y9389 Activity, other specified: Secondary | ICD-10-CM | POA: Diagnosis not present

## 2014-04-17 DIAGNOSIS — Z87828 Personal history of other (healed) physical injury and trauma: Secondary | ICD-10-CM | POA: Insufficient documentation

## 2014-04-17 DIAGNOSIS — R209 Unspecified disturbances of skin sensation: Secondary | ICD-10-CM | POA: Insufficient documentation

## 2014-04-17 DIAGNOSIS — F329 Major depressive disorder, single episode, unspecified: Secondary | ICD-10-CM | POA: Insufficient documentation

## 2014-04-17 DIAGNOSIS — S99919A Unspecified injury of unspecified ankle, initial encounter: Secondary | ICD-10-CM | POA: Diagnosis present

## 2014-04-17 DIAGNOSIS — S92309A Fracture of unspecified metatarsal bone(s), unspecified foot, initial encounter for closed fracture: Secondary | ICD-10-CM | POA: Diagnosis not present

## 2014-04-17 DIAGNOSIS — Y9289 Other specified places as the place of occurrence of the external cause: Secondary | ICD-10-CM | POA: Insufficient documentation

## 2014-04-17 DIAGNOSIS — S99929A Unspecified injury of unspecified foot, initial encounter: Secondary | ICD-10-CM | POA: Insufficient documentation

## 2014-04-17 DIAGNOSIS — Z8739 Personal history of other diseases of the musculoskeletal system and connective tissue: Secondary | ICD-10-CM | POA: Diagnosis not present

## 2014-04-17 DIAGNOSIS — S8990XA Unspecified injury of unspecified lower leg, initial encounter: Secondary | ICD-10-CM | POA: Diagnosis present

## 2014-04-17 DIAGNOSIS — IMO0001 Reserved for inherently not codable concepts without codable children: Secondary | ICD-10-CM | POA: Diagnosis not present

## 2014-04-17 DIAGNOSIS — F3289 Other specified depressive episodes: Secondary | ICD-10-CM | POA: Insufficient documentation

## 2014-04-17 DIAGNOSIS — F17200 Nicotine dependence, unspecified, uncomplicated: Secondary | ICD-10-CM | POA: Diagnosis not present

## 2014-04-17 DIAGNOSIS — Z79899 Other long term (current) drug therapy: Secondary | ICD-10-CM | POA: Insufficient documentation

## 2014-04-17 DIAGNOSIS — K219 Gastro-esophageal reflux disease without esophagitis: Secondary | ICD-10-CM | POA: Diagnosis not present

## 2014-04-17 DIAGNOSIS — Z8679 Personal history of other diseases of the circulatory system: Secondary | ICD-10-CM | POA: Insufficient documentation

## 2014-04-17 DIAGNOSIS — S92302A Fracture of unspecified metatarsal bone(s), left foot, initial encounter for closed fracture: Secondary | ICD-10-CM

## 2014-04-17 DIAGNOSIS — F411 Generalized anxiety disorder: Secondary | ICD-10-CM | POA: Diagnosis not present

## 2014-04-17 DIAGNOSIS — W010XXA Fall on same level from slipping, tripping and stumbling without subsequent striking against object, initial encounter: Secondary | ICD-10-CM | POA: Insufficient documentation

## 2014-04-17 MED ORDER — OXYCODONE-ACETAMINOPHEN 5-325 MG PO TABS: 1.0000 | ORAL_TABLET | Freq: Four times a day (QID) | ORAL | Status: DC | PRN

## 2014-04-17 MED ORDER — OXYCODONE-ACETAMINOPHEN 5-325 MG PO TABS
2.0000 | ORAL_TABLET | Freq: Once | ORAL | Status: AC
  Administered 2014-04-17: 2 via ORAL
  Filled 2014-04-17: qty 2

## 2014-04-17 MED ORDER — MELOXICAM 7.5 MG PO TABS: 15.0000 mg | ORAL_TABLET | Freq: Every day | ORAL | Status: DC

## 2014-04-17 MED ORDER — ONDANSETRON 4 MG PO TBDP
4.0000 mg | ORAL_TABLET | Freq: Once | ORAL | Status: AC
  Administered 2014-04-17: 4 mg via ORAL
  Filled 2014-04-17: qty 1

## 2014-04-17 NOTE — Progress Notes (Signed)
Orthopedic Tech Progress Note Patient Details:  Amanda Bennett Aug 26, 1956 [id number]  Ortho Devices Type of Ortho Device: Ace wrap;Crutches;Post (short leg) splint Ortho Device/Splint Location: LLE Ortho Device/Splint Interventions: Ordered;Application   Braulio Bosch 04/17/2014, 11:32 AM

## 2014-04-17 NOTE — ED Provider Notes (Signed)
CSN: [id number]     Arrival date & time 04/17/14  4332 History  This chart was scribed for non-physician practitioner working with [place], DO, by Jeanell Sparrow, ED Scribe. This patient was seen in room TR05C/TR05C and the patient's care was started at 9:44 AM.   Chief Complaint  Patient presents with  . Foot Pain   The history is provided by the patient. No language interpreter was used.   HPI Comments: Amanda Bennett is a 58 y.o. female with a hx of osteoporosis who presents to the Emergency Department complaining of left foot pain that started last night. She states that her flip flop broke and her left foot slid across the floor. She currently rates the severity of the pain as a 10/10. She describes the pain as a throbbing sensation. She reports that she took ibuprofen and iced affected area with no relief. She reports associated swelling and numbness and tingling. She states that she has no known allergies to medications. She denies any hx of DM.   Past Medical History  Diagnosis Date  . Foot fracture, right 07/2010    per x-ray 07/2010 - oblique comminuted fifth metatarsal fracture, followed by Dr. Lorelei Pont  . Depression   . Insomnia   . GERD (gastroesophageal reflux disease)   . Carpal tunnel syndrome   . Cervical spinal stenosis 2008    per x-ray findings 2008  . Pulmonary nodule, right     RUL nodule, 7 mm noted on Chest XRAY - 06/2008, per CT follow up in 2009 - No suspicious pulmonary nodules or masse noted  . Cervicalgia   . Pain in limb   . Lumbago   . Chronic pain syndrome   . Cervical syndrome   . Primary localized osteoarthrosis, pelvic region and thigh   . Lesion of ulnar nerve   . Anxiety   . Osteoporosis   . Unexplained weight loss   . Visual disturbance   . Cough   . Arthritis pain   . Bruises easily   . Wound disruption, post-op, skin   . PONV (postoperative nausea and vomiting)     shortness of breath   . Hepatitis     hx of Hep C   . Wears  glasses   . Dysrhythmia     prolonger QT   Past Surgical History  Procedure Laterality Date  . Abdominal hysterectomy    . Finger amputated      right ring  . Drain in left ring finger      cat bite  . Broken clavical    . Cholecystectomy    . Hemorrhoid surgery  11/25/2006  . Cholecystectomy  10/30/2011    Procedure: LAPAROSCOPIC CHOLECYSTECTOMY WITH INTRAOPERATIVE CHOLANGIOGRAM;  Surgeon: Imogene Burn. Georgette Dover, MD;  Location: [facility];  Service: General;  Laterality: N/A;  . Carpal tunnel release      right and left  . Colonoscopy    . Inguinal lymph node biopsy Right 02/03/2014    Procedure: INGUINAL LYMPH NODE BIOPSY;  Surgeon: Zenovia Jarred, MD;  Location: [facility];  Service: General;  Laterality: Right;  right inguinal area   Family History  Problem Relation Age of Onset  . Stroke Neg Hx   . Stomach cancer Neg Hx   . Cancer Father     colon/prostate  . Colon cancer Father 29    deceased at age 6 from colon cancer  . Cancer Paternal Grandmother     ovarian  History  Substance Use Topics  . Smoking status: Light Tobacco Smoker -- 0.10 packs/day for 20 years    Types: Cigarettes    Last Attempt to Quit: 01/08/2011  . Smokeless tobacco: Never Used     Comment: pack lasts about a week./ 1 PK EVERY 3 DAYS  . Alcohol Use: No   OB History   Grav Para Term Preterm Abortions TAB SAB Ect Mult Living                 Review of Systems  Musculoskeletal: Positive for arthralgias, joint swelling and myalgias.  Neurological: Positive for numbness.  All other systems reviewed and are negative.   Allergies  Morphine and related; Prozac; and Tylenol  Home Medications   Prior to Admission medications   Medication Sig Start Date End Date Taking? Authorizing Provider  albuterol (PROVENTIL HFA;VENTOLIN HFA) 108 (90 BASE) MCG/ACT inhaler Inhale 1-2 puffs into the lungs every 6 (six) hours as needed for wheezing or shortness of breath.   Yes Historical Provider, MD   amLODipine (NORVASC) 5 MG tablet Take 5 mg by mouth daily.   Yes Historical Provider, MD  citalopram (CELEXA) 20 MG tablet Take 40 mg by mouth daily.   Yes Historical Provider, MD  diphenhydrAMINE (BENADRYL) 25 mg capsule Take 25 mg by mouth daily as needed for allergies.   Yes Historical Provider, MD  gabapentin (NEURONTIN) 300 MG capsule Take 600 mg by mouth 4 (four) times daily.   Yes Historical Provider, MD  omeprazole (PRILOSEC) 20 MG capsule Take 20 mg by mouth daily.   Yes Historical Provider, MD  oxyCODONE (OXY IR/ROXICODONE) 5 MG immediate release tablet Take 2.5-5 mg by mouth every 4 (four) hours as needed for severe pain.   Yes Historical Provider, MD  meloxicam (MOBIC) 7.5 MG tablet Take 2 tablets (15 mg total) by mouth daily. 04/17/14   Noland Fordyce, PA-C  oxyCODONE-acetaminophen (PERCOCET/ROXICET) 5-325 MG per tablet Take 1-2 tablets by mouth every 6 (six) hours as needed for moderate pain or severe pain. 04/17/14   Noland Fordyce, PA-C   BP 131/84  Pulse 68  Temp(Src) 98.4 F (36.9 C) (Oral)  Resp 17  Ht 5\' 4"  (1.626 m)  Wt 127 lb (57.607 kg)  BMI 21.79 kg/m2  SpO2 97%  LMP 10/23/2002 Physical Exam  Nursing note and vitals reviewed. Constitutional: She is oriented to person, place, and time. She appears well-developed and well-nourished.  HENT:  Head: Normocephalic and atraumatic.  Eyes: EOM are normal.  Neck: Normal range of motion.  Cardiovascular: Normal rate.   Cap. reill <2. Pedal pulses 2+.  Pulmonary/Chest: Effort normal.  Musculoskeletal: Normal range of motion. She exhibits edema and tenderness.  Mild edema and ecchymosis to dorsal aspect with tenderness. Decreased ROM due to pain. No obvious deformity.   Neurological: She is alert and oriented to person, place, and time.  Decreased sensation to light touch toes 3, 4 , and 5 on left foot.   Skin: Skin is warm and dry.  Skin was intact.   Psychiatric: She has a normal mood and affect. Her behavior is normal.     ED Course  Procedures (including critical care time) DIAGNOSTIC STUDIES: Oxygen Saturation is 97% on RA, normal by my interpretation.    COORDINATION OF CARE: 9:48 AM- Pt advised of plan for treatment which includes medication and radiology and pt agrees.  Labs Review Labs Reviewed - No data to display  Imaging Review Dg Foot Complete Left  04/17/2014  CLINICAL DATA:  Pain and swelling.  EXAM: LEFT FOOT - COMPLETE 3+ VIEW  COMPARISON:  None.  FINDINGS: There is a displaced fracture of the distal third metatarsal bone. Nondisplaced fracture involving the mid/distal second metatarsal bone. No other definite fractures.  IMPRESSION: Fractures of the second and third metatarsal bones.   Electronically Signed   By: Markus Daft M.D.   On: 04/17/2014 11:02     EKG Interpretation None      MDM   Final diagnoses:  Fracture of 2nd metatarsal, left, closed, initial encounter  Fracture of 3rd metatarsal, left, closed, initial encounter    Pt is a 58yo female presenting to ED with left foot pain after trip and fall wearing flip-flops. Left foot is neurovascularly in tact. Mild ecchymosis and edema with tenderness. Plain films: significant for fractures of 2nd and 3rd metatarsal bones. Only mild displacement of 3rd metatarsal, no displacement of 2nd metatarsal. Pt placed in posterior short leg splint. Provided crutches. Advised to f/u with Dr. Percell Miller, orthopedics within 1 week for further evaluation and treatment of foot fracture.  Home care instructions provided. Pt verbalized understanding and agreement with tx plan. Discussed pt with Dr. Leonides Schanz who agrees with tx plan.   I personally performed the services described in this documentation, which was scribed in my presence. The recorded information has been reviewed and is accurate.   Noland Fordyce, PA-C 04/17/14 1556

## 2014-04-17 NOTE — Discharge Instructions (Signed)
Cast or Splint Care °Casts and splints support injured limbs and keep bones from moving while they heal.  °HOME CARE °· Keep the cast or splint uncovered during the drying period. °¨ A plaster cast can take 24 to 48 hours to dry. °¨ A fiberglass cast will dry in less than 1 hour. °· Do not rest the cast on anything harder than a pillow for 24 hours. °· Do not put weight on your injured limb. Do not put pressure on the cast. Wait for your doctor's approval. °· Keep the cast or splint dry. °¨ Cover the cast or splint with a plastic bag during baths or wet weather. °¨ If you have a cast over your chest and belly (trunk), take sponge baths until the cast is taken off. °¨ If your cast gets wet, dry it with a towel or blow dryer. Use the cool setting on the blow dryer. °· Keep your cast or splint clean. Wash a dirty cast with a damp cloth. °· Do not put any objects under your cast or splint. °· Do not scratch the skin under the cast with an object. If itching is a problem, use a blow dryer on a cool setting over the itchy area. °· Do not trim or cut your cast. °· Do not take out the padding from inside your cast. °· Exercise your joints near the cast as told by your doctor. °· Raise (elevate) your injured limb on 1 or 2 pillows for the first 1 to 3 days. °GET HELP IF: °· Your cast or splint cracks. °· Your cast or splint is too tight or too loose. °· You itch badly under the cast. °· Your cast gets wet or has a soft spot. °· You have a bad smell coming from the cast. °· You get an object stuck under the cast. °· Your skin around the cast becomes red or sore. °· You have new or more pain after the cast is put on. °GET HELP RIGHT AWAY IF: °· You have fluid leaking through the cast. °· You cannot move your fingers or toes. °· Your fingers or toes turn blue or white or are cool, painful, or puffy (swollen). °· You have tingling or lose feeling (numbness) around the injured area. °· You have bad pain or pressure under the  cast. °· You have trouble breathing or have shortness of breath. °· You have chest pain. °Document Released: 12/27/2010 Document Revised: 04/29/2013 Document Reviewed: 03/05/2013 °Exit[employer]® Patient Information ©2015 [employer]. This information is not intended to replace advice given to you by your health care provider. Make sure you discuss any questions you have with your health care provider. ° °

## 2014-04-17 NOTE — ED Notes (Signed)
Fell in flip flops yesterday, injured lt. Foot.   Mild swelling no deformity

## 2014-04-17 NOTE — ED Provider Notes (Signed)
Medical screening examination/treatment/procedure(s) were performed by non-physician practitioner and as supervising physician I was immediately available for consultation/collaboration.   EKG Interpretation None        Delice Bison Shacoya Burkhammer, DO 04/17/14 1558

## 2014-04-17 NOTE — Progress Notes (Signed)
Orthopedic Tech Progress Note Patient Details:  Amanda Bennett 1956/01/02 [id number]  Ortho Devices Type of Ortho Device: Ace wrap;Crutches;Post (short leg) splint Ortho Device/Splint Location: LLE Ortho Device/Splint Interventions: Ordered;Application   Braulio Bosch 04/17/2014, 11:32 AM

## 2014-06-24 ENCOUNTER — Telehealth: Payer: Self-pay | Admitting: *Deleted

## 2014-06-24 DIAGNOSIS — G4700 Insomnia, unspecified: Secondary | ICD-10-CM

## 2014-06-24 NOTE — Telephone Encounter (Signed)
Request refill on Trazodone 50 mg take 1/4 tablet ( 12.5mg ) at HS Last filled 11/24/13 Not on med list, do you want to refill?

## 2014-06-29 ENCOUNTER — Other Ambulatory Visit: Payer: Self-pay | Admitting: *Deleted

## 2014-06-29 MED ORDER — TRAZODONE HCL 50 MG PO TABS: 12.5000 mg | ORAL_TABLET | Freq: Every day | ORAL | Status: DC

## 2014-06-29 MED ORDER — TRAZODONE HCL 50 MG PO TABS: ORAL_TABLET | ORAL | Status: DC

## 2014-06-29 NOTE — Telephone Encounter (Signed)
Hard for me to make that decision since I didn't see her. But trazodone is usually benign so will approve it.

## 2014-06-29 NOTE — Telephone Encounter (Signed)
Call from [facility] - need clarification of directions; there are 2 set of instructions and also verify qty. Thanks

## 2014-07-21 ENCOUNTER — Encounter: Payer: Self-pay | Admitting: Internal Medicine

## 2014-07-21 ENCOUNTER — Ambulatory Visit (INDEPENDENT_AMBULATORY_CARE_PROVIDER_SITE_OTHER): Payer: Medicare Other | Admitting: Internal Medicine

## 2014-07-21 VITALS — BP 149/90 | HR 83 | Temp 98.1°F | Ht 65.0 in | Wt 133.7 lb

## 2014-07-21 DIAGNOSIS — G4700 Insomnia, unspecified: Secondary | ICD-10-CM

## 2014-07-21 DIAGNOSIS — F418 Other specified anxiety disorders: Secondary | ICD-10-CM

## 2014-07-21 DIAGNOSIS — R1031 Right lower quadrant pain: Secondary | ICD-10-CM

## 2014-07-21 DIAGNOSIS — Z720 Tobacco use: Secondary | ICD-10-CM

## 2014-07-21 DIAGNOSIS — R109 Unspecified abdominal pain: Secondary | ICD-10-CM

## 2014-07-21 DIAGNOSIS — Z716 Tobacco abuse counseling: Secondary | ICD-10-CM | POA: Diagnosis not present

## 2014-07-21 DIAGNOSIS — K1370 Unspecified lesions of oral mucosa: Secondary | ICD-10-CM

## 2014-07-21 DIAGNOSIS — J029 Acute pharyngitis, unspecified: Secondary | ICD-10-CM

## 2014-07-21 LAB — COMPLETE METABOLIC PANEL WITH GFR
ALT: 21 U/L (ref 0–35)
AST: 32 U/L (ref 0–37)
Albumin: 4.4 g/dL (ref 3.5–5.2)
Alkaline Phosphatase: 125 U/L — ABNORMAL HIGH (ref 39–117)
BUN: 8 mg/dL (ref 6–23)
CO2: 25 mEq/L (ref 19–32)
Calcium: 9.8 mg/dL (ref 8.4–10.5)
Chloride: 103 mEq/L (ref 96–112)
Creat: 0.61 mg/dL (ref 0.50–1.10)
GFR, Est African American: >89 mL/min
GFR, Est Non African American: >89 mL/min
Glucose, Bld: 75 mg/dL (ref 70–99)
Potassium: 3.9 mEq/L (ref 3.5–5.3)
Sodium: 140 mEq/L (ref 135–145)
Total Bilirubin: 0.4 mg/dL (ref 0.2–1.2)
Total Protein: 7.6 g/dL (ref 6.0–8.3)

## 2014-07-21 MED ORDER — VARENICLINE TARTRATE 0.5 MG PO TABS: ORAL_TABLET | ORAL | Status: DC

## 2014-07-21 MED ORDER — ZOLPIDEM TARTRATE 5 MG PO TABS: 5.0000 mg | ORAL_TABLET | Freq: Every evening | ORAL | Status: DC | PRN

## 2014-07-21 MED ORDER — CITALOPRAM HYDROBROMIDE 20 MG PO TABS: 20.0000 mg | ORAL_TABLET | Freq: Every day | ORAL | Status: DC

## 2014-07-21 MED ORDER — BUSPIRONE HCL 7.5 MG PO TABS: 7.5000 mg | ORAL_TABLET | Freq: Two times a day (BID) | ORAL | Status: DC

## 2014-07-21 MED ORDER — VARENICLINE TARTRATE 1 MG PO TABS: ORAL_TABLET | ORAL | Status: DC

## 2014-07-21 NOTE — Patient Instructions (Signed)
General Instructions: Please start Busbar for anxiety in addition to Celexa. Follow up in 1 month Take care  Get a CT of your pelvis  Start Chantix 0.5 mg daily for 1st 3 days, then 0.5 2x per day day 4 to day 7, then 1 mg 2x per day x 11 weeks  Your blood pressure today was 149/90 and your goal is <140/<90  Treatment Goals:  Goals (1 Years of Data) as of 07/21/14      Lifestyle   . Prevent Falls       Progress Toward Treatment Goals:  Treatment Goal 07/21/2014  Blood pressure at goal  Stop smoking smoking the same amount  Prevent falls -    Self Care Goals & Plans:  Self Care Goal 07/21/2014  Manage my medications take my medicines as prescribed; bring my medications to every visit; refill my medications on time; follow the sick day instructions if I am sick  Monitor my health keep track of my blood pressure  Eat healthy foods drink diet soda or water instead of juice or soda; eat more vegetables; eat foods that are low in salt; eat baked foods instead of fried foods; eat fruit for snacks and desserts; eat smaller portions  Be physically active find an activity I enjoy  Stop smoking call [facility] (1-800-QUIT-NOW)  Meeting treatment goals maintain the current self-care plan    No flowsheet data found.   Care Management & Community Referrals:  Referral 07/21/2014  Referrals made for care management support none needed  Referrals made to community resources none    Generalized Anxiety Disorder Generalized anxiety disorder (GAD) is a mental disorder. It interferes with life functions, including relationships, work, and school. GAD is different from normal anxiety, which everyone experiences at some point in their lives in response to specific life events and activities. Normal anxiety actually helps [place] prepare for and get through these life events and activities. Normal anxiety goes away after the event or activity is over.  GAD causes anxiety that is not necessarily  related to specific events or activities. It also causes excess anxiety in proportion to specific events or activities. The anxiety associated with GAD is also difficult to control. GAD can vary from mild to severe. People with severe GAD can have intense waves of anxiety with physical symptoms (panic attacks).  SYMPTOMS The anxiety and worry associated with GAD are difficult to control. This anxiety and worry are related to many life events and activities and also occur more days than not for 6 months or longer. People with GAD also have three or more of the following symptoms (one or more in children):  Restlessness.   Fatigue.  Difficulty concentrating.   Irritability.  Muscle tension.  Difficulty sleeping or unsatisfying sleep. DIAGNOSIS GAD is diagnosed through an assessment by your health care provider. Your health care provider will ask you questions aboutyour mood,physical symptoms, and events in your life. Your health care provider may ask you about your medical history and use of alcohol or drugs, including prescription medicines. Your health care provider may also do a physical exam and blood tests. Certain medical conditions and the use of certain substances can cause symptoms similar to those associated with GAD. Your health care provider may refer you to a mental health specialist for further evaluation. TREATMENT The following therapies are usually used to treat GAD:   Medication. Antidepressant medication usually is prescribed for long-term daily control. Antianxiety medicines may be added in severe cases, especially when  panic attacks occur.   Talk therapy (psychotherapy). Certain types of talk therapy can be helpful in treating GAD by providing support, education, and guidance. A form of talk therapy called cognitive behavioral therapy can teach you healthy ways to think about and react to daily life events and activities.  Stress managementtechniques. These include  yoga, meditation, and exercise and can be very helpful when they are practiced regularly. A mental health specialist can help determine which treatment is best for you. Some people see improvement with one therapy. However, other people require a combination of therapies. Document Released: 12/22/2012 Document Revised: 01/11/2014 Document Reviewed: 12/22/2012 [facility] Patient Information 2015 [place], [place]. This information is not intended to replace advice given to you by your health care provider. Make sure you discuss any questions you have with your health care provider.  Buspirone tablets What is this medicine? BUSPIRONE (byoo SPYE rone) is used to treat anxiety disorders. This medicine may be used for other purposes; ask your health care provider or pharmacist if you have questions. COMMON BRAND NAME(S): BuSpar What should I tell my health care provider before I take this medicine? They need to know if you have any of these conditions: -kidney or liver disease -an unusual or allergic reaction to buspirone, other medicines, foods, dyes, or preservatives -pregnant or trying to get pregnant -breast-feeding How should I use this medicine? Take this medicine by mouth with a glass of water. Follow the directions on the prescription label. You may take this medicine with or without food. To ensure that this medicine always works the same way for you, you should take it either always with or always without food. Take your doses at regular intervals. Do not take your medicine more often than directed. Do not stop taking except on the advice of your doctor or health care professional. Talk to your pediatrician regarding the use of this medicine in children. Special care may be needed. Overdosage: If you think you have taken too much of this medicine contact a poison control center or emergency room at once. NOTE: This medicine is only for you. Do not share this medicine with others. What if I miss  a dose? If you miss a dose, take it as soon as you can. If it is almost time for your next dose, take only that dose. Do not take double or extra doses. What may interact with this medicine? Do not take this medicine with any of the following medications: -linezolid -MAOIs like Carbex, Eldepryl, Marplan, Nardil, and Parnate -methylene blue -procarbazine This medicine may also interact with the following medications: -diazepam -digoxin -diltiazem -erythromycin -grapefruit juice -haloperidol -medicines for mental depression or mood problems -medicines for seizures like carbamazepine, phenobarbital and phenytoin -nefazodone -other medications for anxiety -rifampin -ritonavir -some antifungal medicines like itraconazole, ketoconazole, and voriconazole -verapamil -warfarin This list may not describe all possible interactions. Give your health care provider a list of all the medicines, herbs, non-prescription drugs, or dietary supplements you use. Also tell them if you smoke, drink alcohol, or use illegal drugs. Some items may interact with your medicine. What should I watch for while using this medicine? Visit your doctor or health care professional for regular checks on your progress. It may take 1 to 2 weeks before your anxiety gets better. You may get drowsy or dizzy. Do not drive, use machinery, or do anything that needs mental alertness until you know how this drug affects you. Do not stand or sit up quickly, especially if you  are an older patient. This reduces the risk of dizzy or fainting spells. Alcohol can make you more drowsy and dizzy. Avoid alcoholic drinks. What side effects may I notice from receiving this medicine? Side effects that you should report to your doctor or health care professional as soon as possible: -blurred vision or other vision changes -chest pain -confusion -difficulty breathing -feelings of hostility or anger -muscle aches and pains -numbness or  tingling in hands or feet -ringing in the ears -skin rash and itching -vomiting -weakness Side effects that usually do not require medical attention (report to your doctor or health care professional if they continue or are bothersome): -disturbed dreams, nightmares -headache -nausea -restlessness or nervousness -sore throat and nasal congestion -stomach upset This list may not describe all possible side effects. Call your doctor for medical advice about side effects. You may report side effects to FDA at 1-800-FDA-1088. Where should I keep my medicine? Keep out of the reach of children. Store at room temperature below 30 degrees C (86 degrees F). Protect from light. Keep container tightly closed. Throw away any unused medicine after the expiration date. NOTE: This sheet is a summary. It may not cover all possible information. If you have questions about this medicine, talk to your doctor, pharmacist, or health care provider.  2015, [employer] Standard. (2010-04-06 18:06:11)

## 2014-07-22 ENCOUNTER — Encounter: Payer: Self-pay | Admitting: Internal Medicine

## 2014-07-22 LAB — CBC WITH DIFFERENTIAL/PLATELET
Basophils Absolute: 0 10*3/uL (ref 0.0–0.1)
Basophils Relative: 0 % (ref 0–1)
Eosinophils Absolute: 0.1 10*3/uL (ref 0.0–0.7)
Eosinophils Relative: 1 % (ref 0–5)
HCT: 42.7 % (ref 36.0–46.0)
Hemoglobin: 14.7 g/dL (ref 12.0–15.0)
Lymphocytes Relative: 26 % (ref 12–46)
Lymphs Abs: 2.4 10*3/uL (ref 0.7–4.0)
MCH: 32.8 pg (ref 26.0–34.0)
MCHC: 34.4 g/dL (ref 30.0–36.0)
MCV: 95.3 fL (ref 78.0–100.0)
Monocytes Absolute: 0.8 10*3/uL (ref 0.1–1.0)
Monocytes Relative: 9 % (ref 3–12)
Neutro Abs: 6 10*3/uL (ref 1.7–7.7)
Neutrophils Relative %: 64 % (ref 43–77)
Platelets: 264 10*3/uL (ref 150–400)
RBC: 4.48 MIL/uL (ref 3.87–5.11)
RDW: 13.7 % (ref 11.5–15.5)
WBC: 9.3 10*3/uL (ref 4.0–10.5)

## 2014-07-22 NOTE — Progress Notes (Signed)
   Subjective:    Patient ID: Amanda Bennett, female    DOB: [birth year], 58 y.o.   MRN: [id number]  HPI Comments: 58 y.o pmh of chronic medical conditions (see problem list)  She presents for f/u 1. She c/o sore throat x 1 month. She has tried Listerine and warm salt water w/o relief.  She states she feels like something is running down her throat at night but denies heartburn or postnasal drip.  She does have a history of seasonal allergies.   2. She c/o small growth growing left side of back of mouth/gumline that is increasing in size. She also states lymph node is present at times near left jawline 3. 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  She states she previously took Klonopin which helped but the clinic her took her off Klonopin and Celexa 20 mg is not helping.  Trazadone is not helping sleep and Ambien has helped in the past.   4. She c/o insomina x 3 nights.  She is going crazy b/c she has not been able to sleep to the point of almost hallucinating.   5. Tobacco abuse-she is interested in quitting smoking and wants to try Chantix  6. C/o right groin pain x >1 month.  Feels like dull deep ache 10/10 esp. At night only takes Aspirin for pain.  Pain keeps her up at night.  ~4 months ago pt had inguinal node biopsy (negative results) then complications of infection in the site which had ostomy bag and she was hospitalized and required packing in the area (seen by Dr. Grandville Silos).  She is worried about foreign body such as gauze being present in the area.         Review of Systems  Constitutional: Positive for chills.       Subjective fever, Chills come and go, and sweats  HENT: Positive for sore throat.   Respiratory: Negative for shortness of breath.   Gastrointestinal:  Negative for nausea, vomiting and abdominal pain.  Psychiatric/Behavioral: Positive for sleep disturbance. Negative for suicidal ideas. The patient is nervous/anxious.        Objective:   Physical Exam  Constitutional: She is oriented to person, place, and time. She appears well-developed and well-nourished. She is cooperative. No distress.  HENT:  Head: Normocephalic and atraumatic.  Mouth/Throat: Oropharynx is clear and moist and mucous membranes are normal. No oropharyngeal exudate, posterior oropharyngeal edema, posterior oropharyngeal erythema or tonsillar abscesses.    Eyes: Conjunctivae are normal. Right eye exhibits no discharge. Left eye exhibits no discharge. No scleral icterus.  Neck:    Cardiovascular: Normal rate, regular rhythm, S1 normal, S2 normal and normal heart sounds.   No murmur heard. Pulmonary/Chest: Effort normal and breath sounds normal. No respiratory distress. She has no wheezes.  Abdominal: Soft. Bowel sounds are normal. There is no tenderness.    Neurological: She is alert and oriented to person, place, and time. Gait normal.  Skin: Skin is warm and dry. No rash noted. She is not diaphoretic.  Psychiatric: She has a normal mood and affect. Her speech is normal and behavior is normal. Judgment and thought content normal.  Nursing note and vitals reviewed.         Assessment & Plan:

## 2014-07-23 ENCOUNTER — Ambulatory Visit (HOSPITAL_COMMUNITY): Admission: RE | Admit: 2014-07-23 | Payer: Self-pay | Source: Ambulatory Visit

## 2014-07-23 DIAGNOSIS — J029 Acute pharyngitis, unspecified: Secondary | ICD-10-CM | POA: Insufficient documentation

## 2014-07-23 DIAGNOSIS — Z720 Tobacco use: Secondary | ICD-10-CM | POA: Insufficient documentation

## 2014-07-23 DIAGNOSIS — K1370 Unspecified lesions of oral mucosa: Secondary | ICD-10-CM | POA: Insufficient documentation

## 2014-07-23 NOTE — Assessment & Plan Note (Addendum)
Uncontrolled with Celexa 20 mg Will try Busbar 7.5 mg bid for anxiety and to augment Celexa. Cant titrate up Celexa on PPI aboove 20 mg Monitor for side effects i.e Serotonin syndrome Given info on [employer]

## 2014-07-23 NOTE — Assessment & Plan Note (Addendum)
Etiology may be viral, related to postnasal drip no obvious strep Centor criteria low will not swab for strep Continue warm salt water gargles, sx'matic tx.  RTC if not improving

## 2014-07-23 NOTE — Progress Notes (Signed)
Internal Medicine Clinic Attending  Case discussed with Dr. Aundra Dubin soon after the resident saw the patient.  We reviewed the resident's history and exam and pertinent patient test results.  I agree with the assessment, diagnosis, and plan of care documented in the resident's note. Will need close monitoring as pt now on Celexa, Buspar, Chantix, Ambien, and PPI.

## 2014-07-23 NOTE — Assessment & Plan Note (Addendum)
Previously had lymph node biopsy and complications with  Bacterial infection seen by Dr. Grandville Silos ([place]) Will refer back to [facility] to assess and see if anything else needs to be done pt is concerned for foreign bodyi.e CT pelvis w/o contrast  Initially ordered but will see what surgery says at 07/23/14 appt whether necessary Can try prn Advil for pain Will check CMET, CBC Pt will see Dr. Zella Richer 07/23/14

## 2014-07-23 NOTE — Assessment & Plan Note (Signed)
disc'ed with pt to disc with her dentist

## 2014-07-23 NOTE — Assessment & Plan Note (Signed)
Will d/c Trazadone and try Ambien prn

## 2014-07-23 NOTE — Assessment & Plan Note (Signed)
Will try chantix 0.5 mg day 1-3, 0.5 mg bid day 4-7 and 1 mg bid x 11 weeks Reassess smoking cessation at f/u

## 2014-08-23 ENCOUNTER — Other Ambulatory Visit: Payer: Self-pay | Admitting: *Deleted

## 2014-08-24 ENCOUNTER — Other Ambulatory Visit: Payer: Self-pay | Admitting: *Deleted

## 2014-08-24 MED ORDER — AMLODIPINE BESYLATE 5 MG PO TABS: 5.0000 mg | ORAL_TABLET | Freq: Every day | ORAL | Status: DC

## 2014-08-24 MED ORDER — GABAPENTIN 300 MG PO CAPS: 600.0000 mg | ORAL_CAPSULE | Freq: Four times a day (QID) | ORAL | Status: DC

## 2014-08-24 NOTE — Telephone Encounter (Signed)
Pt aware.

## 2014-09-06 ENCOUNTER — Encounter (HOSPITAL_COMMUNITY): Payer: Self-pay | Admitting: Family Medicine

## 2014-09-06 ENCOUNTER — Emergency Department (HOSPITAL_COMMUNITY)
Admission: EM | Admit: 2014-09-06 | Discharge: 2014-09-06 | Disposition: A | Discharge status: Home / Self Care | Payer: Medicare Other | Attending: Emergency Medicine | Admitting: Emergency Medicine

## 2014-09-06 DIAGNOSIS — G894 Chronic pain syndrome: Secondary | ICD-10-CM | POA: Diagnosis not present

## 2014-09-06 DIAGNOSIS — M5442 Lumbago with sciatica, left side: Secondary | ICD-10-CM | POA: Insufficient documentation

## 2014-09-06 DIAGNOSIS — Z7982 Long term (current) use of aspirin: Secondary | ICD-10-CM | POA: Diagnosis not present

## 2014-09-06 DIAGNOSIS — Z8781 Personal history of (healed) traumatic fracture: Secondary | ICD-10-CM | POA: Diagnosis not present

## 2014-09-06 DIAGNOSIS — K219 Gastro-esophageal reflux disease without esophagitis: Secondary | ICD-10-CM | POA: Insufficient documentation

## 2014-09-06 DIAGNOSIS — Z720 Tobacco use: Secondary | ICD-10-CM | POA: Insufficient documentation

## 2014-09-06 DIAGNOSIS — Z79899 Other long term (current) drug therapy: Secondary | ICD-10-CM | POA: Diagnosis not present

## 2014-09-06 DIAGNOSIS — M5441 Lumbago with sciatica, right side: Secondary | ICD-10-CM | POA: Insufficient documentation

## 2014-09-06 DIAGNOSIS — F329 Major depressive disorder, single episode, unspecified: Secondary | ICD-10-CM | POA: Insufficient documentation

## 2014-09-06 DIAGNOSIS — F419 Anxiety disorder, unspecified: Secondary | ICD-10-CM | POA: Insufficient documentation

## 2014-09-06 DIAGNOSIS — M545 Low back pain: Secondary | ICD-10-CM | POA: Diagnosis present

## 2014-09-06 LAB — URINALYSIS, ROUTINE W REFLEX MICROSCOPIC
Bilirubin Urine: NEGATIVE
Glucose, UA: NEGATIVE mg/dL
Hgb urine dipstick: NEGATIVE
Ketones, ur: NEGATIVE mg/dL
Leukocytes, UA: NEGATIVE
Nitrite: NEGATIVE
Protein, ur: NEGATIVE mg/dL
Specific Gravity, Urine: 1.005 (ref 1.005–1.030)
Urobilinogen, UA: 0.2 mg/dL (ref 0.0–1.0)
pH: 7.5 (ref 5.0–8.0)

## 2014-09-06 MED ORDER — METHOCARBAMOL 500 MG PO TABS
500.0000 mg | ORAL_TABLET | Freq: Once | ORAL | Status: AC
  Administered 2014-09-06: 500 mg via ORAL
  Filled 2014-09-06: qty 1

## 2014-09-06 MED ORDER — KETOROLAC TROMETHAMINE 60 MG/2ML IM SOLN
60.0000 mg | Freq: Once | INTRAMUSCULAR | Status: AC
  Administered 2014-09-06: 60 mg via INTRAMUSCULAR
  Filled 2014-09-06: qty 2

## 2014-09-06 MED ORDER — NAPROXEN 500 MG PO TABS: 500.0000 mg | ORAL_TABLET | Freq: Two times a day (BID) | ORAL | Status: DC

## 2014-09-06 MED ORDER — METHOCARBAMOL 500 MG PO TABS: 1000.0000 mg | ORAL_TABLET | Freq: Four times a day (QID) | ORAL | Status: DC

## 2014-09-06 MED ORDER — OXYCODONE HCL 5 MG PO TABS: 5.0000 mg | ORAL_TABLET | ORAL | Status: DC | PRN

## 2014-09-06 MED ORDER — OXYCODONE HCL 5 MG PO TABS
5.0000 mg | ORAL_TABLET | Freq: Once | ORAL | Status: AC
  Administered 2014-09-06: 5 mg via ORAL
  Filled 2014-09-06: qty 1

## 2014-09-06 NOTE — Discharge Instructions (Signed)
Please read and follow all provided instructions.  Your diagnoses today include:  1. Bilateral low back pain with right-sided sciatica    Tests performed today include:  Vital signs - see below for your results today  Urine test - no signs of infection  Medications prescribed:   Oxycodone - narcotic pain medication  DO NOT drive or perform any activities that require you to be awake and alert because this medicine can make you drowsy.    Robaxin (methocarbamol) - muscle relaxer medication  DO NOT drive or perform any activities that require you to be awake and alert because this medicine can make you drowsy.    Naproxen - anti-inflammatory pain medication  Do not exceed 500mg  naproxen every 12 hours, take with food  You have been prescribed an anti-inflammatory medication or NSAID. Take with food. Take smallest effective dose for the shortest duration needed for your pain. Stop taking if you experience stomach pain or vomiting.   Take any prescribed medications only as directed.  Home care instructions:   Follow any educational materials contained in this packet  Please rest, use ice or heat on your back for the next several days  Do not lift, push, pull anything more than 10 pounds for the next week  Follow-up instructions: Please follow-up with your primary care provider in the next 1 week for further evaluation of your symptoms. You may also try to schedule an appointment with the neurosurgery referral listed.   Return instructions:  SEEK IMMEDIATE MEDICAL ATTENTION IF YOU HAVE:  New numbness, tingling, weakness, or problem with the use of your arms or legs  Severe back pain not relieved with medications  Loss control of your bowels or bladder  Increasing pain in any areas of the body (such as chest or abdominal pain)  Shortness of breath, dizziness, or fainting.   Worsening nausea (feeling sick to your stomach), vomiting, fever, or sweats  Any other  emergent concerns regarding your health   Additional Information:  Your vital signs today were: BP 150/95 mmHg   Pulse 57   Temp(Src) 98.3 F (36.8 C)   Resp 14   SpO2 99%   LMP 10/23/2002 If your blood pressure (BP) was elevated above 135/85 this visit, please have this repeated by your doctor within one month. --------------

## 2014-09-06 NOTE — ED Notes (Addendum)
Per pt sts mid and lower back pain severe since last night. sts she feels like her back is going to come out of her rectum. sts she has a brace on and if she didn't something may break loose. sts that her heart has been racing and feels like her heart is doing flips. sts similar to panic attack. Denies currently and HR 68 pulse regular.

## 2014-09-06 NOTE — ED Provider Notes (Signed)
CSN: [id number]     Arrival date & time 09/06/14  1024 History   First MD Initiated Contact with Patient 09/06/14 1133     Chief Complaint  Patient presents with  . Back Pain     (Consider location/radiation/quality/duration/timing/severity/associated sxs/prior Treatment) HPI Comments: Patient with history of osteoporosis, degenerative disc disease s/p steroid injections in her back several years ago -- presents with waxing and waning lower back pain over the past month, worse over the past 3 days. Patient describes a sharp pain in her lower back, more on the right side, with radiation of pain into her right leg. Movement of the lower extremity worse with standing on a lower extremity makes her back hurt worse. Treated with OTC pain meds prior to arrival. Patient denies warning symptoms of back pain including: fecal incontinence, urinary retention or overflow incontinence, night sweats, waking from sleep with back pain, unexplained fevers or weight loss, h/o cancer, IVDU, recent trauma.  Patient also notes left groin pain that is worse when her back pain is worse. She has a history of total hysterectomy. No vaginal bleeding or discharge. No dysuria, hematuria, frequency. When pain is worse, she also complains of heart racing.    Patient is a 58 y.o. female presenting with back pain. The history is provided by the patient and medical records.  Back Pain Associated symptoms: abdominal pain (L groin)   Associated symptoms: no chest pain, no dysuria, no fever, no headaches, no numbness, no pelvic pain and no weakness     Past Medical History  Diagnosis Date  . Foot fracture, right 07/2010    per x-ray 07/2010 - oblique comminuted fifth metatarsal fracture, followed by Dr. Lorelei Pont  . Depression   . Insomnia   . GERD (gastroesophageal reflux disease)   . Carpal tunnel syndrome   . Cervical spinal stenosis 2008    per x-ray findings 2008  . Pulmonary nodule, right     RUL nodule, 7 mm noted  on Chest XRAY - 06/2008, per CT follow up in 2009 - No suspicious pulmonary nodules or masse noted  . Cervicalgia   . Pain in limb   . Lumbago   . Chronic pain syndrome   . Cervical syndrome   . Primary localized osteoarthrosis, pelvic region and thigh   . Lesion of ulnar nerve   . Anxiety   . Osteoporosis   . Unexplained weight loss   . Visual disturbance   . Cough   . Arthritis pain   . Bruises easily   . Wound disruption, post-op, skin   . PONV (postoperative nausea and vomiting)     shortness of breath   . Hepatitis     hx of Hep C   . Wears glasses   . Dysrhythmia     prolonger QT   Past Surgical History  Procedure Laterality Date  . Abdominal hysterectomy    . Finger amputated      right ring  . Drain in left ring finger      cat bite  . Broken clavical    . Cholecystectomy    . Hemorrhoid surgery  11/25/2006  . Cholecystectomy  10/30/2011    Procedure: LAPAROSCOPIC CHOLECYSTECTOMY WITH INTRAOPERATIVE CHOLANGIOGRAM;  Surgeon: Imogene Burn. Georgette Dover, MD;  Location: [facility];  Service: General;  Laterality: N/A;  . Carpal tunnel release      right and left  . Colonoscopy    . Inguinal lymph node biopsy Right 02/03/2014    Procedure: INGUINAL  LYMPH NODE BIOPSY;  Surgeon: Zenovia Jarred, MD;  Location: [facility];  Service: General;  Laterality: Right;  right inguinal area   Family History  Problem Relation Age of Onset  . Stroke Neg Hx   . Stomach cancer Neg Hx   . Cancer Father     colon/prostate  . Colon cancer Father 71    deceased at age 98 from colon cancer  . Cancer Paternal Grandmother     ovarian   History  Substance Use Topics  . Smoking status: Light Tobacco Smoker -- 0.10 packs/day for 20 years    Types: Cigarettes    Last Attempt to Quit: 01/08/2011  . Smokeless tobacco: Never Used     Comment: pack lasts about a week./ 1 PK EVERY 3 DAYS.  Wants Chantex  . Alcohol Use: No   OB History    No data available     Review of Systems    Constitutional: Negative for fever and unexpected weight change.  HENT: Negative for rhinorrhea and sore throat.   Eyes: Negative for redness.  Respiratory: Negative for cough.   Cardiovascular: Negative for chest pain.  Gastrointestinal: Positive for abdominal pain (L groin). Negative for nausea, vomiting, diarrhea and constipation.       Negative for fecal incontinence.   Genitourinary: Negative for dysuria, hematuria, flank pain, vaginal bleeding, vaginal discharge and pelvic pain.       Negative for urinary incontinence or retention.  Musculoskeletal: Positive for back pain. Negative for myalgias.  Skin: Negative for rash.  Neurological: Negative for weakness, numbness and headaches.       Denies saddle paresthesias.    Allergies  Morphine and related; Prozac; and Tylenol  Home Medications   Prior to Admission medications   Medication Sig Start Date End Date Taking? Authorizing Provider  albuterol (PROVENTIL HFA;VENTOLIN HFA) 108 (90 BASE) MCG/ACT inhaler Inhale 1-2 puffs into the lungs every 6 (six) hours as needed for wheezing or shortness of breath.   Yes Historical Provider, MD  amLODipine (NORVASC) 5 MG tablet Take 1 tablet (5 mg total) by mouth daily. 08/24/14  Yes Tasrif Ahmed, MD  aspirin 325 MG tablet Take 325 mg by mouth daily.   Yes Historical Provider, MD  busPIRone (BUSPAR) 7.5 MG tablet Take 1 tablet (7.5 mg total) by mouth 2 (two) times daily. Patient taking differently: Take 7.5 mg by mouth daily.  07/21/14  Yes Cresenciano Genre, MD  citalopram (CELEXA) 20 MG tablet Take 1 tablet (20 mg total) by mouth daily. 07/21/14  Yes Cresenciano Genre, MD  diphenhydrAMINE (BENADRYL) 25 mg capsule Take 25 mg by mouth daily as needed for allergies.   Yes Historical Provider, MD  gabapentin (NEURONTIN) 300 MG capsule Take 2 capsules (600 mg total) by mouth 4 (four) times daily. 08/24/14  Yes Tasrif Ahmed, MD  omeprazole (PRILOSEC) 20 MG capsule Take 20 mg by mouth daily.   Yes  Historical Provider, MD  meloxicam (MOBIC) 7.5 MG tablet Take 2 tablets (15 mg total) by mouth daily. Patient not taking: Reported on 09/06/2014 04/17/14   Noland Fordyce, PA-C  varenicline (CHANTIX) 0.5 MG tablet Take 0.5 mg daily for day 1 to Day 3. Then take 0.5 mg twice a day for day 4 to day 7 Patient not taking: Reported on 09/06/2014 07/21/14   Cresenciano Genre, MD  varenicline (CHANTIX) 1 MG tablet 1 mg 2x per day from day 8 for 11 weeks Patient not taking: Reported on 09/06/2014  07/21/14   Cresenciano Genre, MD  zolpidem (AMBIEN) 5 MG tablet Take 1 tablet (5 mg total) by mouth at bedtime as needed for sleep. Patient not taking: Reported on 09/06/2014 07/21/14 08/20/14  Cresenciano Genre, MD   BP 153/87 mmHg  Pulse 63  Temp(Src) 98.3 F (36.8 C)  Resp 16  SpO2 100%  LMP 10/23/2002   Physical Exam  Constitutional: She appears well-developed and well-nourished.  HENT:  Head: Normocephalic and atraumatic.  Eyes: Conjunctivae are normal.  Neck: Normal range of motion. Neck supple.  Pulmonary/Chest: Effort normal.  Abdominal: Soft. There is no tenderness. There is no CVA tenderness.  Musculoskeletal: Normal range of motion.       Right hip: Normal.       Left hip: Normal.       Right knee: Normal.       Left knee: Normal.       Right ankle: Normal.       Left ankle: Normal.       Cervical back: She exhibits normal range of motion, no tenderness and no bony tenderness.       Thoracic back: She exhibits normal range of motion, no tenderness and no bony tenderness.       Lumbar back: She exhibits tenderness. She exhibits normal range of motion and no bony tenderness.       Back:  No step-off noted with palpation of spine.   Neurological: She is alert. She has normal strength and normal reflexes. No sensory deficit.  5/5 strength in entire lower extremities bilaterally. No sensation deficit.   Skin: Skin is warm and dry. No rash noted.  Psychiatric: She has a normal mood and affect.    Nursing note and vitals reviewed.   ED Course  Procedures (including critical care time) Labs Review Labs Reviewed  URINALYSIS, ROUTINE W REFLEX MICROSCOPIC    Imaging Review No results found.   EKG Interpretation None       11:53 AM Patient seen and examined. Work-up initiated. Medications ordered.   Vital signs reviewed and are as follows: Filed Vitals:   09/06/14 1127  BP: 153/87  Pulse: 63  Temp:   Resp: 16    Patient's pain was improved with treatment. She ambulated in ED without any problems.   We discussed need to follow-up with neurosurg or PCP. She agrees with this plan. Discussed no emergent need for MRI at this time.   No red flag s/s of low back pain. Patient was counseled on back pain precautions and told to do activity as tolerated but do not lift, push, or pull heavy objects more than 10 pounds for the next week.  Patient counseled to use ice or heat on back for no longer than 15 minutes every hour.   Patient prescribed muscle relaxer and counseled on proper use of muscle relaxant medication.    Patient prescribed narcotic pain medicine and counseled on proper use of narcotic pain medications. Counseled not to combine this medication with others containing tylenol.   Urged patient not to drink alcohol, drive, or perform any other activities that requires focus while taking either of these medications.  Patient urged to follow-up with PCP if pain does not improve with treatment and rest or if pain becomes recurrent. Urged to return with worsening severe pain, loss of bowel or bladder control, trouble walking.   The patient verbalizes understanding and agrees with the plan.   MDM   Final diagnoses:  Bilateral low back  pain with right-sided sciatica   Patient with back pain, history of degenerative discs. Symptoms today are consistent with lumbar radiculopathy. No neurological deficits. Patient is ambulatory. No warning symptoms of back pain including:  fecal incontinence, urinary retention or overflow incontinence, night sweats, waking from sleep with back pain, unexplained fevers or weight loss, h/o cancer, IVDU, recent trauma. No concern for cauda equina, epidural abscess, or other serious cause of back pain. Conservative measures such as rest, ice/heat and pain medicine indicated with PCP follow-up if no improvement with conservative management.      Carlisle Cater, PA-C 09/06/14 [place], MD 09/07/14 [phone]

## 2014-09-06 NOTE — ED Notes (Signed)
Pt. Ambulated independently in hallway approx. 200 feet. Steady gait.

## 2014-09-13 ENCOUNTER — Encounter: Payer: Self-pay | Admitting: Internal Medicine

## 2014-09-13 ENCOUNTER — Ambulatory Visit (HOSPITAL_COMMUNITY)
Admission: RE | Admit: 2014-09-13 | Discharge: 2014-09-13 | Disposition: A | Discharge status: Home / Self Care | Payer: Medicare Other | Source: Ambulatory Visit | Attending: Internal Medicine | Admitting: Internal Medicine

## 2014-09-13 ENCOUNTER — Ambulatory Visit (INDEPENDENT_AMBULATORY_CARE_PROVIDER_SITE_OTHER): Payer: Medicare Other | Admitting: Internal Medicine

## 2014-09-13 VITALS — BP 137/84 | HR 77 | Temp 98.2°F | Ht 65.0 in | Wt 140.0 lb

## 2014-09-13 DIAGNOSIS — M810 Age-related osteoporosis without current pathological fracture: Secondary | ICD-10-CM | POA: Diagnosis present

## 2014-09-13 DIAGNOSIS — M4697 Unspecified inflammatory spondylopathy, lumbosacral region: Secondary | ICD-10-CM | POA: Diagnosis not present

## 2014-09-13 DIAGNOSIS — M545 Low back pain, unspecified: Secondary | ICD-10-CM

## 2014-09-13 DIAGNOSIS — M4696 Unspecified inflammatory spondylopathy, lumbar region: Secondary | ICD-10-CM | POA: Insufficient documentation

## 2014-09-13 DIAGNOSIS — M25552 Pain in left hip: Secondary | ICD-10-CM | POA: Diagnosis not present

## 2014-09-13 DIAGNOSIS — M25551 Pain in right hip: Secondary | ICD-10-CM | POA: Insufficient documentation

## 2014-09-13 MED ORDER — CYCLOBENZAPRINE HCL 10 MG PO TABS: 10.0000 mg | ORAL_TABLET | Freq: Three times a day (TID) | ORAL | Status: AC | PRN

## 2014-09-13 MED ORDER — OXYCODONE HCL 5 MG PO TABS: 7.5000 mg | ORAL_TABLET | Freq: Four times a day (QID) | ORAL | Status: DC | PRN

## 2014-09-13 NOTE — Patient Instructions (Signed)
General Instructions: Please go upstairs to get your Xray done.    You will need to go to [facility] hospital at some point to have your DEXA scan completed.  Please take Oxycodone and Flexeril as prescribed.  Please bring your medicines with you each time you come to clinic.  Medicines may include prescription medications, over-the-counter medications, herbal remedies, eye drops, vitamins, or other pills.   Progress Toward Treatment Goals:  Treatment Goal 07/21/2014  Blood pressure at goal  Stop smoking smoking the same amount  Prevent falls -    Self Care Goals & Plans:  Self Care Goal 09/13/2014  Manage my medications take my medicines as prescribed; bring my medications to every visit; refill my medications on time  Monitor my health -  Eat healthy foods drink diet soda or water instead of juice or soda; eat more vegetables; eat foods that are low in salt; eat baked foods instead of fried foods; eat fruit for snacks and desserts  Be physically active -  Stop smoking -  Meeting treatment goals -    No flowsheet data found.   Care Management & Community Referrals:  Referral 07/21/2014  Referrals made for care management support none needed  Referrals made to community resources none

## 2014-09-14 MED ORDER — OXYCODONE HCL 5 MG PO TABS
7.5000 mg | ORAL_TABLET | Freq: Four times a day (QID) | ORAL | Status: DC | PRN
Start: 2014-09-14 — End: 2014-09-24

## 2014-09-14 NOTE — Assessment & Plan Note (Signed)
Patient is presenting with acute on chronic low back pain that appears to be worsening and very debilitating (patient only able to walk steps).  She has a history of osteoporosis (early menopause due to hysterectomy) for which she reports she only had about a year of boniva injections before she stopped for dental work.  She does have a history of fragility fractures (last in 2014>> foot), and I am concerned she could have a fracture.   - On physical exam she does have tenderness over her lower lumbar spinous processes as well as on her right PSIS. Will obtain Xrays of Lumbar spine as well as pelvis >>>> returned no fracture seen. - Will treat pain with oxycodone (reports upsets stomach when combined with tylenol.)  Increase to 7.5mg  Q6prn will give #60.  >>> minutes after leaving office to go upstairs for Xray she reports she lost her AVS and Narcotic Rx.  My nursing staff informed her we do no reprint Rx and she should search for the Rx (with her daughter) and contact [facility] security.  She was apparently unable to find this.  After hours I discussed this with our clinic director Dr. Lynnae January and we ran a Narcotics database check which does not show any suspicious activity.  I did reprint a Rx and informed Mrs Polinsky should she find her missing Rx she needs to turn it in to the [facility] for destruction or she or anyone else who may use it can face criminal charges. - For muscle spasm she apparently did not tolerate SOMA, will try Flexeril.

## 2014-09-14 NOTE — Progress Notes (Signed)
[facility] Subjective:   Patient ID: Amanda Bennett female   DOB: July 26, 1956 59 y.o.   MRN: [id number]  HPI: [name] is a 59 y.o. female with a PMH below who presents for ED follow up for back pain.  She was seen in the ED on 09/06/14 with CC of low back pain.  She had no red flag symptoms and was discharged with narcotic pain medications and muscle relaxants.  Today she reports that her pain has continued to worsen and her main red flag symptom is the severity of her pain which is now making it difficult for her to walk (in wheelchair and can only take a few steps), she was ambulated 256ft in the ED.  She denies warning symptoms of back pain including: fecal incontinence, urinary retention or overflow incontinence, night sweats, waking from sleep with back pain, unexplained fevers or weight loss, h/o cancer, IVDU, recent trauma.   She reports that her pain originated in her lower back and that it seems to radiate around her pelvis to her groin.  It does not radiate to her legs.  She reports some tingling but now numbness or weakness.  She does have a history of a severe accident with a horse trampling her in 2001 where she reports she shattered her pelvis and required a hysterectomy.  She then underwent early menopause and has had a history of osteoporosis with fragility fractures.  It appears her last fragility fracture was in Aug 2015 when she broke some tarsal bones from a misstep with a filp-flop.  Past Medical History  Diagnosis Date  . Foot fracture, right 07/2010    per x-ray 07/2010 - oblique comminuted fifth metatarsal fracture, followed by Dr. Lorelei Pont  . Depression   . Insomnia   . GERD (gastroesophageal reflux disease)   . Carpal tunnel syndrome   . Cervical spinal stenosis 2008    per x-ray findings 2008  . Pulmonary nodule, right     RUL nodule, 7 mm noted on Chest XRAY - 06/2008, per CT follow up in 2009 - No suspicious pulmonary nodules or  masse noted  . Cervicalgia   . Pain in limb   . Lumbago   . Chronic pain syndrome   . Cervical syndrome   . Primary localized osteoarthrosis, pelvic region and thigh   . Lesion of ulnar nerve   . Anxiety   . Osteoporosis   . Unexplained weight loss   . Visual disturbance   . Cough   . Arthritis pain   . Bruises easily   . Wound disruption, post-op, skin   . PONV (postoperative nausea and vomiting)     shortness of breath   . Hepatitis     hx of Hep C   . Wears glasses   . Dysrhythmia     prolonger QT   Current Outpatient Prescriptions  Medication Sig Dispense Refill  . albuterol (PROVENTIL HFA;VENTOLIN HFA) 108 (90 BASE) MCG/ACT inhaler Inhale 1-2 puffs into the lungs every 6 (six) hours as needed for wheezing or shortness of breath.    Marland Kitchen amLODipine (NORVASC) 5 MG tablet Take 1 tablet (5 mg total) by mouth daily. 90 tablet 1  . aspirin 325 MG tablet Take 325 mg by mouth daily.    . busPIRone (BUSPAR) 7.5 MG tablet Take 1 tablet (7.5 mg total) by mouth 2 (two) times daily. (Patient taking differently: Take 7.5 mg by mouth daily. ) 60 tablet 0  . citalopram (  CELEXA) 20 MG tablet Take 1 tablet (20 mg total) by mouth daily. 30 tablet 2  . cyclobenzaprine (FLEXERIL) 10 MG tablet Take 1 tablet (10 mg total) by mouth every 8 (eight) hours as needed for muscle spasms. 30 tablet 1  . diphenhydrAMINE (BENADRYL) 25 mg capsule Take 25 mg by mouth daily as needed for allergies.    Marland Kitchen gabapentin (NEURONTIN) 300 MG capsule Take 2 capsules (600 mg total) by mouth 4 (four) times daily. 120 capsule 3  . meloxicam (MOBIC) 7.5 MG tablet Take 2 tablets (15 mg total) by mouth daily. (Patient not taking: Reported on 09/06/2014) 30 tablet 0  . naproxen (NAPROSYN) 500 MG tablet Take 1 tablet (500 mg total) by mouth 2 (two) times daily. 20 tablet 0  . omeprazole (PRILOSEC) 20 MG capsule Take 20 mg by mouth daily.    Marland Kitchen oxyCODONE (ROXICODONE) 5 MG immediate release tablet Take 1.5 tablets (7.5 mg total) by  mouth every 6 (six) hours as needed for severe pain. 60 tablet 0  . varenicline (CHANTIX) 0.5 MG tablet Take 0.5 mg daily for day 1 to Day 3. Then take 0.5 mg twice a day for day 4 to day 7 (Patient not taking: Reported on 09/06/2014) 6 tablet 0  . varenicline (CHANTIX) 1 MG tablet 1 mg 2x per day from day 8 for 11 weeks (Patient not taking: Reported on 09/06/2014) 60 tablet 2  . zolpidem (AMBIEN) 5 MG tablet Take 1 tablet (5 mg total) by mouth at bedtime as needed for sleep. (Patient not taking: Reported on 09/06/2014) 30 tablet 1   No current facility-administered medications for this visit.   Family History  Problem Relation Age of Onset  . Stroke Neg Hx   . Stomach cancer Neg Hx   . Cancer Father     colon/prostate  . Colon cancer Father 56    deceased at age 54 from colon cancer  . Cancer Paternal Grandmother     ovarian   History   Social History  . Marital Status: Single    Spouse Name: N/A    Number of Children: N/A  . Years of Education: N/A   Social History Main Topics  . Smoking status: Light Tobacco Smoker -- 0.10 packs/day for 20 years    Types: Cigarettes    Last Attempt to Quit: 01/08/2011  . Smokeless tobacco: Never Used     Comment: pack lasts about a week./ 1 PK EVERY 3 DAYS.  Wants Chantex  . Alcohol Use: No  . Drug Use: No  . Sexual Activity: None   Other Topics Concern  . None   Social History Narrative   Financial assistance approved for 100% discount at [facility] and has [facility] card, also given pink county card.  per Bonna Gains 12/09/2009   Review of Systems: Review of Systems  Constitutional: Negative for fever, chills, weight loss and malaise/fatigue.  Eyes: Negative for blurred vision.  Respiratory: Negative for cough and shortness of breath.   Cardiovascular: Negative for chest pain and leg swelling.  Gastrointestinal: Negative for heartburn and abdominal pain.  Genitourinary: Negative for dysuria.  Musculoskeletal: Positive for back pain.  Negative for myalgias, joint pain and falls.  Skin: Negative for rash.  Neurological: Positive for tingling. Negative for focal weakness and headaches.     Objective:  Physical Exam: Filed Vitals:   09/13/14 1443  BP: 137/84  Pulse: 77  Temp: 98.2 F (36.8 C)  TempSrc: Oral  Height: 5\' 5"  (1.651 m)  Weight: 140 lb (63.504 kg)  SpO2: 100%  Physical Exam  Constitutional: She appears distressed.  In wheelchair, wearing back brace.  Able to stand and take 4-5 steps to exam table.  Visibly uncomfortable.  Cardiovascular: Normal rate, regular rhythm and normal heart sounds.   Pulmonary/Chest: Effort normal and breath sounds normal.  Musculoskeletal:  Tenderness of spinous processes of L3-L5, tenderness and hypertrophy of paraspinal musculature of lumbar region bilaterally. Tenderness of paraspinal musculature of T5-6 on the right, tenderness of paraspinal musculature of the C7-T1 on the left. 5/5 muscular strength of bilateral knee, ankle and hip joints.  Straight leg raise negative bilaterally (can lift to ~40 deg)  Neurological:  2/4 DTR of patellar and achilles bilaterally.  Sensation intact to light touch of bilateral lower extremities.  Nursing note and vitals reviewed.   Assessment & Plan:  Case discussed with Dr. Lynnae January  Low back pain Patient is presenting with acute on chronic low back pain that appears to be worsening and very debilitating (patient only able to walk steps).  She has a history of osteoporosis (early menopause due to hysterectomy) for which she reports she only had about a year of boniva injections before she stopped for dental work.  She does have a history of fragility fractures (last in 2014>> foot), and I am concerned she could have a fracture.   - On physical exam she does have tenderness over her lower lumbar spinous processes as well as on her right PSIS. Will obtain Xrays of Lumbar spine as well as pelvis >>>> returned no fracture seen. - Will treat pain  with oxycodone (reports upsets stomach when combined with tylenol.)  Increase to 7.5mg  Q6prn will give #60.  >>> minutes after leaving office to go upstairs for Xray she reports she lost her AVS and Narcotic Rx.  My nursing staff informed her we do no reprint Rx and she should search for the Rx (with her daughter) and contact [facility] security.  She was apparently unable to find this.  After hours I discussed this with our clinic director Dr. Lynnae January and we ran a Narcotics database check which does not show any suspicious activity.  I did reprint a Rx and informed Mrs Kasik should she find her missing Rx she needs to turn it in to the [facility] for destruction or she or anyone else who may use it can face criminal charges. - For muscle spasm she apparently did not tolerate SOMA, will try Flexeril.  Osteoporosis - Patient has history of Osteoporosis which she reports she received a year of Boniva injections prior to stopping for a dental procedure.  She does not appear to have a fracture today on Xray.  Will also obtain a DEXA scan to help determine the benefit of resuming IV bisphosphonate.   Medications Ordered Meds ordered this encounter  Medications  . cyclobenzaprine (FLEXERIL) 10 MG tablet    Sig: Take 1 tablet (10 mg total) by mouth every 8 (eight) hours as needed for muscle spasms.    Dispense:  30 tablet    Refill:  1  . DISCONTD: oxyCODONE (ROXICODONE) 5 MG immediate release tablet    Sig: Take 1.5 tablets (7.5 mg total) by mouth every 6 (six) hours as needed for severe pain.    Dispense:  60 tablet    Refill:  0  . oxyCODONE (ROXICODONE) 5 MG immediate release tablet    Sig: Take 1.5 tablets (7.5 mg total) by mouth every 6 (six) hours as needed for  severe pain.    Dispense:  60 tablet    Refill:  0   Other Orders Orders Placed This Encounter  Procedures  . DG Lumbar Spine Complete    Standing Status: Future     Number of Occurrences: 1     Standing Expiration Date: 11/12/2015    Order  Specific Question:  Reason for Exam (SYMPTOM  OR DIAGNOSIS REQUIRED)    Answer:  severe acute on chronic low back pain, history of osteoprosis with fragility fractures    Order Specific Question:  Is the patient pregnant?    Answer:  No    Order Specific Question:  Preferred imaging location?    Answer:  [facility]    Standing Status: Future     Number of Occurrences: 1     Standing Expiration Date: 11/12/2015    Order Specific Question:  Reason for Exam (SYMPTOM  OR DIAGNOSIS REQUIRED)    Answer:  severe low back pain with tenderness over lumbar spine and left PSIS    Order Specific Question:  Is the patient pregnant?    Answer:  No    Order Specific Question:  Preferred imaging location?    Answer:  [facility]  . DG Bone Density    Standing Status: Future     Number of Occurrences:      Standing Expiration Date: 11/12/2015    Order Specific Question:  Reason for Exam (SYMPTOM  OR DIAGNOSIS REQUIRED)    Answer:  osteoprosis    Order Specific Question:  Is the patient pregnant?    Answer:  No    Order Specific Question:  Preferred imaging location?    Answer:  [facility]

## 2014-09-14 NOTE — Assessment & Plan Note (Signed)
-   Patient has history of Osteoporosis which she reports she received a year of Boniva injections prior to stopping for a dental procedure.  She does not appear to have a fracture today on Xray.  Will also obtain a DEXA scan to help determine the benefit of resuming IV bisphosphonate.

## 2014-09-15 NOTE — Progress Notes (Signed)
Internal Medicine Clinic Attending  Case discussed with Dr. Hoffman soon after the resident saw the patient.  We reviewed the resident's history and exam and pertinent patient test results.  I agree with the assessment, diagnosis, and plan of care documented in the resident's note. 

## 2014-09-20 ENCOUNTER — Ambulatory Visit: Payer: Medicare Other | Admitting: Internal Medicine

## 2014-09-21 ENCOUNTER — Ambulatory Visit (HOSPITAL_COMMUNITY)
Admission: RE | Admit: 2014-09-21 | Discharge: 2014-09-21 | Disposition: A | Discharge status: Home / Self Care | Payer: Medicare Other | Source: Ambulatory Visit | Attending: Internal Medicine | Admitting: Internal Medicine

## 2014-09-21 DIAGNOSIS — M810 Age-related osteoporosis without current pathological fracture: Secondary | ICD-10-CM | POA: Diagnosis present

## 2014-09-24 ENCOUNTER — Ambulatory Visit (INDEPENDENT_AMBULATORY_CARE_PROVIDER_SITE_OTHER): Payer: Medicare Other | Admitting: Internal Medicine

## 2014-09-24 ENCOUNTER — Encounter: Payer: Self-pay | Admitting: Internal Medicine

## 2014-09-24 VITALS — BP 148/93 | HR 79 | Temp 97.9°F | Ht 64.0 in | Wt 139.5 lb

## 2014-09-24 DIAGNOSIS — M810 Age-related osteoporosis without current pathological fracture: Secondary | ICD-10-CM

## 2014-09-24 DIAGNOSIS — M79604 Pain in right leg: Secondary | ICD-10-CM

## 2014-09-24 DIAGNOSIS — M7071 Other bursitis of hip, right hip: Secondary | ICD-10-CM | POA: Diagnosis present

## 2014-09-24 DIAGNOSIS — M545 Low back pain, unspecified: Secondary | ICD-10-CM

## 2014-09-24 MED ORDER — OXYCODONE HCL 5 MG PO TABS: 7.5000 mg | ORAL_TABLET | ORAL | Status: DC | PRN

## 2014-09-24 MED ORDER — MELOXICAM 15 MG PO TABS: 15.0000 mg | ORAL_TABLET | Freq: Every day | ORAL | Status: DC

## 2014-09-24 NOTE — Progress Notes (Signed)
[facility] Subjective:   Patient ID: Amanda Bennett female   DOB: 1956/01/22 59 y.o.   MRN: [id number]  HPI: [name] is a 59 y.o. female with a PMH below who presents for follow up of her acute on chronic low back pain. She reports she is still having debilitating low back pain that is preventing her from doing even simple chores as she reports she is in excruciating pain when she stands.  She does report that the oxycodone has been helpful to "take the edge off" the pain but does not completely take away the pain.  She reports she is currently in 9-10/10 located in her lower back with some radiation to her right leg.  However she now notes numbness over the lateral aspect of her right thigh which is new over the last week.  She has not appreciated any focal weakness, still has not had any loss of bowel or bladder function.  At the last visit we obtained Xrays that did not reveal any accute process.  She does have known osteoporosis.    Past Medical History  Diagnosis Date  . Foot fracture, right 07/2010    per x-ray 07/2010 - oblique comminuted fifth metatarsal fracture, followed by Dr. Lorelei Pont  . Depression   . Insomnia   . GERD (gastroesophageal reflux disease)   . Carpal tunnel syndrome   . Cervical spinal stenosis 2008    per x-ray findings 2008  . Pulmonary nodule, right     RUL nodule, 7 mm noted on Chest XRAY - 06/2008, per CT follow up in 2009 - No suspicious pulmonary nodules or masse noted  . Cervicalgia   . Pain in limb   . Lumbago   . Chronic pain syndrome   . Cervical syndrome   . Primary localized osteoarthrosis, pelvic region and thigh   . Lesion of ulnar nerve   . Anxiety   . Osteoporosis   . Unexplained weight loss   . Visual disturbance   . Cough   . Arthritis pain   . Bruises easily   . Wound disruption, post-op, skin   . PONV (postoperative nausea and vomiting)     shortness of breath   . Hepatitis     hx of Hep C    . Wears glasses   . Dysrhythmia     prolonger QT   Current Outpatient Prescriptions  Medication Sig Dispense Refill  . albuterol (PROVENTIL HFA;VENTOLIN HFA) 108 (90 BASE) MCG/ACT inhaler Inhale 1-2 puffs into the lungs every 6 (six) hours as needed for wheezing or shortness of breath.    Marland Kitchen amLODipine (NORVASC) 5 MG tablet Take 1 tablet (5 mg total) by mouth daily. 90 tablet 1  . aspirin 325 MG tablet Take 325 mg by mouth daily.    . busPIRone (BUSPAR) 7.5 MG tablet Take 1 tablet (7.5 mg total) by mouth 2 (two) times daily. (Patient taking differently: Take 7.5 mg by mouth daily. ) 60 tablet 0  . citalopram (CELEXA) 20 MG tablet Take 1 tablet (20 mg total) by mouth daily. 30 tablet 2  . cyclobenzaprine (FLEXERIL) 10 MG tablet Take 1 tablet (10 mg total) by mouth every 8 (eight) hours as needed for muscle spasms. 30 tablet 1  . diphenhydrAMINE (BENADRYL) 25 mg capsule Take 25 mg by mouth daily as needed for allergies.    Marland Kitchen gabapentin (NEURONTIN) 300 MG capsule Take 2 capsules (600 mg total) by mouth 4 (four) times daily. [place]  capsule 3  . meloxicam (MOBIC) 15 MG tablet Take 1 tablet (15 mg total) by mouth daily. 30 tablet 0  . omeprazole (PRILOSEC) 20 MG capsule Take 20 mg by mouth daily.    Marland Kitchen oxyCODONE (ROXICODONE) 5 MG immediate release tablet Take 1.5 tablets (7.5 mg total) by mouth every 4 (four) hours as needed for severe pain. 90 tablet 0  . varenicline (CHANTIX) 0.5 MG tablet Take 0.5 mg daily for day 1 to Day 3. Then take 0.5 mg twice a day for day 4 to day 7 (Patient not taking: Reported on 09/06/2014) 6 tablet 0  . varenicline (CHANTIX) 1 MG tablet 1 mg 2x per day from day 8 for 11 weeks (Patient not taking: Reported on 09/06/2014) 60 tablet 2  . zolpidem (AMBIEN) 5 MG tablet Take 1 tablet (5 mg total) by mouth at bedtime as needed for sleep. (Patient not taking: Reported on 09/06/2014) 30 tablet 1   No current facility-administered medications for this visit.   Family History   Problem Relation Age of Onset  . Stroke Neg Hx   . Stomach cancer Neg Hx   . Cancer Father     colon/prostate  . Colon cancer Father 13    deceased at age 78 from colon cancer  . Cancer Paternal Grandmother     ovarian   History   Social History  . Marital Status: Single    Spouse Name: N/A    Number of Children: N/A  . Years of Education: N/A   Social History Main Topics  . Smoking status: Light Tobacco Smoker -- 0.10 packs/day for 20 years    Types: Cigarettes    Last Attempt to Quit: 01/08/2011  . Smokeless tobacco: Never Used     Comment: pack lasts about a week./ 1/2PPD  . Alcohol Use: No  . Drug Use: No  . Sexual Activity: Not on file   Other Topics Concern  . Not on file   Social History Narrative   Financial assistance approved for 100% discount at [facility] and has [facility] card, also [place] card.  per Bonna Gains 12/09/2009   Review of Systems: Review of Systems  Constitutional: Negative for fever, chills, weight loss and malaise/fatigue.  Eyes: Negative for blurred vision.  Respiratory: Negative for cough and shortness of breath.   Cardiovascular: Negative for chest pain.  Gastrointestinal: Negative for heartburn and abdominal pain.  Genitourinary: Negative for dysuria.  Musculoskeletal: Positive for back pain. Negative for joint pain and falls.  Neurological: Negative for dizziness and headaches.  Psychiatric/Behavioral: Negative for depression.     Objective:  Physical Exam: Filed Vitals:   09/24/14 1358  BP: 148/93  Pulse: 79  Temp: 97.9 F (36.6 C)  TempSrc: Oral  Height: 5\' 4"  (1.626 m)  Weight: 139 lb 8 oz (63.277 kg)  SpO2: 100%  Physical Exam  Constitutional: She is oriented to person, place, and time.  In wheelchair  Cardiovascular: Normal rate and regular rhythm.   Pulmonary/Chest: Effort normal and breath sounds normal.  Abdominal: Soft. Bowel sounds are normal.  Musculoskeletal:  No pinpoint tenderness of lumbar spinous  processes, does have mild tenderness of paraspinal musculature. Tenderness over right greater trochanter.  Neurological: She is alert and oriented to person, place, and time.  Reflex Scores:      Patellar reflexes are 2+ on the right side and 2+ on the left side.      Achilles reflexes are 2+ on the right side and 2+ on  the left side. 4+/5 strength of bilateral dorsiflexion and plantar flexion. 5/5 bilateral strength of knee flexion and extension.  4+/5 right hip flexion, 5/5 left hip flexion. Decreased sensation of light touch over anterior distal tibia on the right, intact sensation to pinpoint touch on the right. Gait not assessed  Nursing note and vitals reviewed.   Assessment & Plan:  Case discussed with Dr. Lynnae January  Bursitis of hip, right - Suspect patient again has some right trochanteric bursitis by exam, however this would not explain her neurologic symptoms or low back pain. - Rx Mobic.   Low back pain radiating to right leg - Patient continues to have severe low back pain now for about 1 month.  She has failed therapy with NSAIDs, and is only getting minor relief with Narcotic therapy.  Xrays of lumbar spine and hip have not revealed source of this acute pain and she now reports neurologic symptoms and right radiculopathy and potentially has some loss of light touch of her right leg. - Will obtain MRI of lumbar spine given her worsening pain and neurologic symptoms. - Continue Oxycodone 7.5 mg, will make more frequent with Q4PRN.   Osteoporosis - Patient did complete repeat Dexa, which reveals worsening Osteporosis.  Will hold off treatment with bisphosphonates at this time will working up low back pain.  At some point in the near future she would likely be a good candidate for Reclast injections yearly.     Medications Ordered Meds ordered this encounter  Medications  . meloxicam (MOBIC) 15 MG tablet    Sig: Take 1 tablet (15 mg total) by mouth daily.    Dispense:  30  tablet    Refill:  0  . oxyCODONE (ROXICODONE) 5 MG immediate release tablet    Sig: Take 1.5 tablets (7.5 mg total) by mouth every 4 (four) hours as needed for severe pain.    Dispense:  90 tablet    Refill:  0   Other Orders Orders Placed This Encounter  Procedures  . MR Lumbar Spine Wo Contrast    Standing Status: Future     Number of Occurrences:      Standing Expiration Date: 11/23/2015    Order Specific Question:  Reason for Exam (SYMPTOM  OR DIAGNOSIS REQUIRED)    Answer:  1 month of worsening low back pain with radicular symptoms, now 1 week history of numbness of lateral hip    Order Specific Question:  Preferred imaging location?    Answer:  [facility]    Order Specific Question:  Does the patient have a pacemaker or implanted devices?    Answer:  No    Order Specific Question:  What is the patient's sedation requirement?    Answer:  No Sedation

## 2014-09-24 NOTE — Patient Instructions (Signed)
General Instructions: Please have MRI done.  Follow up in 1 week.  Please bring your medicines with you each time you come to clinic.  Medicines may include prescription medications, over-the-counter medications, herbal remedies, eye drops, vitamins, or other pills.   Progress Toward Treatment Goals:  Treatment Goal 07/21/2014  Blood pressure at goal  Stop smoking smoking the same amount  Prevent falls -    Self Care Goals & Plans:  Self Care Goal 09/24/2014  Manage my medications take my medicines as prescribed; bring my medications to every visit; refill my medications on time  Monitor my health -  Eat healthy foods eat more vegetables; eat foods that are low in salt; eat baked foods instead of fried foods  Be physically active -  Stop smoking -  Meeting treatment goals -    No flowsheet data found.   Care Management & Community Referrals:  Referral 07/21/2014  Referrals made for care management support none needed  Referrals made to community resources none

## 2014-09-26 NOTE — Assessment & Plan Note (Signed)
-   Suspect patient again has some right trochanteric bursitis by exam, however this would not explain her neurologic symptoms or low back pain. - Rx Mobic.

## 2014-09-26 NOTE — Assessment & Plan Note (Signed)
-   Patient continues to have severe low back pain now for about 1 month.  She has failed therapy with NSAIDs, and is only getting minor relief with Narcotic therapy.  Xrays of lumbar spine and hip have not revealed source of this acute pain and she now reports neurologic symptoms and right radiculopathy and potentially has some loss of light touch of her right leg. - Will obtain MRI of lumbar spine given her worsening pain and neurologic symptoms. - Continue Oxycodone 7.5 mg, will make more frequent with Q4PRN.

## 2014-09-26 NOTE — Assessment & Plan Note (Signed)
-   Patient did complete repeat Dexa, which reveals worsening Osteporosis.  Will hold off treatment with bisphosphonates at this time will working up low back pain.  At some point in the near future she would likely be a good candidate for Reclast injections yearly.

## 2014-09-27 NOTE — Progress Notes (Signed)
Internal Medicine Clinic Attending  Case discussed with Dr. Hoffman at the time of the visit.  We reviewed the resident's history and exam and pertinent patient test results.  I agree with the assessment, diagnosis, and plan of care documented in the resident's note.  

## 2014-09-28 ENCOUNTER — Encounter: Payer: Self-pay | Admitting: Cardiovascular Disease

## 2014-09-28 ENCOUNTER — Ambulatory Visit (INDEPENDENT_AMBULATORY_CARE_PROVIDER_SITE_OTHER): Payer: Medicare Other | Admitting: Cardiovascular Disease

## 2014-09-28 ENCOUNTER — Encounter: Payer: Self-pay | Admitting: *Deleted

## 2014-09-28 VITALS — BP 112/68 | HR 76 | Ht 64.0 in | Wt 140.0 lb

## 2014-09-28 DIAGNOSIS — R002 Palpitations: Secondary | ICD-10-CM

## 2014-09-28 DIAGNOSIS — I10 Essential (primary) hypertension: Secondary | ICD-10-CM

## 2014-09-28 NOTE — Patient Instructions (Signed)
Your physician has recommended that you wear a 48 hour holter monitor. Holter monitors are medical devices that record the heart's electrical activity. Doctors most often use these monitors to diagnose arrhythmias. Arrhythmias are problems with the speed or rhythm of the heartbeat. The monitor is a small, portable device. You can wear one while you do your normal daily activities. This is usually used to diagnose what is causing palpitations/syncope (passing out).  Your physician recommends that you continue on your current medications as directed. Please refer to the Current Medication list given to you today.  Your physician wants you to follow-up in: 1 YEAR with Dr Fletcher Anon.  You will receive a reminder letter in the mail two months in advance. If you don't receive a letter, please call our office to schedule the follow-up appointment.

## 2014-09-28 NOTE — Progress Notes (Unsigned)
Patient ID: Amanda Bennett, female   DOB: 09-10-1956, 59 y.o.   MRN: [id number] [employer] 48 hour holter monitor applied to patient.

## 2014-09-28 NOTE — Progress Notes (Signed)
HPI  This is a 59 year old female who is here for a follow up visit regarding lowert extremity rash initially thought to be venous ulcers. However, it was determined to be due to an allergic reaction and improved with steroids. She has history of tobacco use, hypertension, anxiety and hepatitis C. No previous cardiac history. She was seen last year for  lower extremity rash and ulceration below the knee bilaterally .  She has family history of prolonged QT syndrome and she did have mildly prolonged QT on EKG in the setting of treatment with medications that can cause that. QT went back to normal after stopping the medications. She was seen by Dr. Caryl Comes. She now reports worsening palpitations described as skipping. She does have known history of anxiety and seems to be more anxious than before. No syncope, presyncope or chest pain.   Allergies  Allergen Reactions  . Morphine And Related Shortness Of Breath, Itching and Nausea Only  . Prozac [Fluoxetine Hcl] Other (See Comments)    depression  . Tylenol [Acetaminophen] Other (See Comments)    Liver Failure      Current Outpatient Prescriptions on File Prior to Visit  Medication Sig Dispense Refill  . albuterol (PROVENTIL HFA;VENTOLIN HFA) 108 (90 BASE) MCG/ACT inhaler Inhale 1-2 puffs into the lungs every 6 (six) hours as needed for wheezing or shortness of breath.    Marland Kitchen amLODipine (NORVASC) 5 MG tablet Take 1 tablet (5 mg total) by mouth daily. 90 tablet 1  . aspirin 325 MG tablet Take 325 mg by mouth daily.    . cyclobenzaprine (FLEXERIL) 10 MG tablet Take 1 tablet (10 mg total) by mouth every 8 (eight) hours as needed for muscle spasms. 30 tablet 1  . gabapentin (NEURONTIN) 300 MG capsule Take 2 capsules (600 mg total) by mouth 4 (four) times daily. 120 capsule 3  . meloxicam (MOBIC) 15 MG tablet Take 1 tablet (15 mg total) by mouth daily. 30 tablet 0  . omeprazole (PRILOSEC) 20 MG capsule Take 20 mg by mouth daily.    Marland Kitchen oxyCODONE  (ROXICODONE) 5 MG immediate release tablet Take 1.5 tablets (7.5 mg total) by mouth every 4 (four) hours as needed for severe pain. 90 tablet 0  . zolpidem (AMBIEN) 5 MG tablet Take 1 tablet (5 mg total) by mouth at bedtime as needed for sleep. 30 tablet 1   No current facility-administered medications on file prior to visit.     Past Medical History  Diagnosis Date  . Foot fracture, right 07/2010    per x-ray 07/2010 - oblique comminuted fifth metatarsal fracture, followed by Dr. Lorelei Pont  . Depression   . Insomnia   . GERD (gastroesophageal reflux disease)   . Carpal tunnel syndrome   . Cervical spinal stenosis 2008    per x-ray findings 2008  . Pulmonary nodule, right     RUL nodule, 7 mm noted on Chest XRAY - 06/2008, per CT follow up in 2009 - No suspicious pulmonary nodules or masse noted  . Cervicalgia   . Pain in limb   . Lumbago   . Chronic pain syndrome   . Cervical syndrome   . Primary localized osteoarthrosis, pelvic region and thigh   . Lesion of ulnar nerve   . Anxiety   . Osteoporosis   . Unexplained weight loss   . Visual disturbance   . Cough   . Arthritis pain   . Bruises easily   . Wound disruption, post-op, skin   .  PONV (postoperative nausea and vomiting)     shortness of breath   . Hepatitis     hx of Hep C   . Wears glasses   . Dysrhythmia     prolonger QT     Past Surgical History  Procedure Laterality Date  . Abdominal hysterectomy    . Finger amputated      right ring  . Drain in left ring finger      cat bite  . Broken clavical    . Cholecystectomy    . Hemorrhoid surgery  11/25/2006  . Cholecystectomy  10/30/2011    Procedure: LAPAROSCOPIC CHOLECYSTECTOMY WITH INTRAOPERATIVE CHOLANGIOGRAM;  Surgeon: Imogene Burn. Georgette Dover, MD;  Location: [facility];  Service: General;  Laterality: N/A;  . Carpal tunnel release      right and left  . Colonoscopy    . Inguinal lymph node biopsy Right 02/03/2014    Procedure: INGUINAL LYMPH NODE BIOPSY;  Surgeon:  Zenovia Jarred, MD;  Location: [facility];  Service: General;  Laterality: Right;  right inguinal area     Family History  Problem Relation Age of Onset  . Stroke Neg Hx   . Stomach cancer Neg Hx   . Cancer Father     colon/prostate  . Colon cancer Father 79    deceased at age 44 from colon cancer  . Cancer Paternal Grandmother     ovarian     History   Social History  . Marital Status: Single    Spouse Name: N/A    Number of Children: N/A  . Years of Education: N/A   Occupational History  . Not on file.   Social History Main Topics  . Smoking status: Light Tobacco Smoker -- 0.10 packs/day for 20 years    Types: Cigarettes    Last Attempt to Quit: 01/08/2011  . Smokeless tobacco: Never Used     Comment: pack lasts about a week./ 1/2PPD  . Alcohol Use: No  . Drug Use: No  . Sexual Activity: Not on file   Other Topics Concern  . Not on file   Social History Narrative   Financial assistance approved for 100% discount at [facility] and has [facility] card, also [place].  per Bonna Gains 12/09/2009     ROS A 10 point review of system was performed. It is negative other than that mentioned in the history of present illness.   PHYSICAL EXAM   BP 112/68 mmHg  Pulse 76  Ht 5\' 4"  (1.626 m)  Wt 140 lb (63.504 kg)  BMI 24.02 kg/m2  SpO2 91%  LMP 10/23/2002 Constitutional: She is oriented to person, place, and time. She appears well-developed and well-nourished. No distress.  HENT: No nasal discharge.  Head: Normocephalic and atraumatic.  Eyes: Pupils are equal and round. No discharge.  Neck: Normal range of motion. Neck supple. No JVD present. No thyromegaly present.  Cardiovascular: Normal rate, regular rhythm, normal heart sounds. Exam reveals no gallop and no friction rub. No murmur heard.  Pulmonary/Chest: Effort normal and breath sounds normal. No stridor. No respiratory distress. She has no wheezes. She has no rales. She exhibits no  tenderness.  Abdominal: Soft. Bowel sounds are normal. She exhibits no distension. There is no tenderness. There is no rebound and no guarding.  Musculoskeletal: Normal range of motion. She exhibits no edema and no tenderness.  Neurological: She is alert and oriented to person, place, and time. Coordination normal.  Skin: Skin is  warm and dry. No rash. She is not diaphoretic. No erythema. No pallor.  Psychiatric: She has a normal mood and affect. Her behavior is normal. Judgment and thought content normal.  Vascular: Distal pulses are palpable.     ASSESSMENT AND PLAN

## 2014-10-01 ENCOUNTER — Encounter: Payer: Self-pay | Admitting: Cardiovascular Disease

## 2014-10-01 DIAGNOSIS — R002 Palpitations: Secondary | ICD-10-CM | POA: Insufficient documentation

## 2014-10-01 NOTE — Assessment & Plan Note (Signed)
The symptoms are suggestive of premature beats and not tachyarrhythmia. She did have previous prolonged QT which normalized after stopping the offending medications. Given her symptoms, I requested a 48-hour Holter monitor. Previous nuclear stress test in January 2015 was normal.

## 2014-10-01 NOTE — Assessment & Plan Note (Signed)
Blood pressure is well controlled on current medications. 

## 2014-10-06 ENCOUNTER — Ambulatory Visit (HOSPITAL_COMMUNITY)
Admission: RE | Admit: 2014-10-06 | Discharge: 2014-10-06 | Disposition: A | Discharge status: Home / Self Care | Payer: Medicare Other | Source: Ambulatory Visit | Attending: Internal Medicine | Admitting: Internal Medicine

## 2014-10-06 DIAGNOSIS — M545 Low back pain, unspecified: Secondary | ICD-10-CM

## 2014-10-06 DIAGNOSIS — M79604 Pain in right leg: Secondary | ICD-10-CM

## 2014-10-07 ENCOUNTER — Encounter: Payer: Self-pay | Admitting: Internal Medicine

## 2014-10-08 ENCOUNTER — Encounter: Payer: Self-pay | Admitting: Internal Medicine

## 2014-10-08 ENCOUNTER — Ambulatory Visit (INDEPENDENT_AMBULATORY_CARE_PROVIDER_SITE_OTHER): Payer: Medicare Other | Admitting: Internal Medicine

## 2014-10-08 VITALS — BP 108/77 | HR 99 | Temp 98.3°F | Ht 64.0 in | Wt 135.3 lb

## 2014-10-08 DIAGNOSIS — M7071 Other bursitis of hip, right hip: Secondary | ICD-10-CM

## 2014-10-08 DIAGNOSIS — Z8619 Personal history of other infectious and parasitic diseases: Secondary | ICD-10-CM | POA: Diagnosis not present

## 2014-10-08 DIAGNOSIS — M79604 Pain in right leg: Secondary | ICD-10-CM

## 2014-10-08 DIAGNOSIS — M545 Low back pain, unspecified: Secondary | ICD-10-CM

## 2014-10-08 DIAGNOSIS — M810 Age-related osteoporosis without current pathological fracture: Secondary | ICD-10-CM

## 2014-10-08 MED ORDER — ACETAMINOPHEN 500 MG PO TABS: 1000.0000 mg | ORAL_TABLET | Freq: Four times a day (QID) | ORAL | Status: DC | PRN

## 2014-10-08 MED ORDER — CALCIUM CARBONATE-VITAMIN D 600-400 MG-UNIT PO TABS: 2.0000 | ORAL_TABLET | Freq: Every day | ORAL | Status: DC

## 2014-10-08 NOTE — Progress Notes (Signed)
Internal Medicine Clinic Attending  Case discussed with Dr. Hoffman at the time of the visit.  We reviewed the resident's history and exam and pertinent patient test results.  I agree with the assessment, diagnosis, and plan of care documented in the resident's note.  

## 2014-10-08 NOTE — Patient Instructions (Signed)
General Instructions: I want you to start taking tylenol for your back pain and wean off of oxycodone.  Please start taking a combination of Calcium and Vitamin D. Goal is 1200mg  of calcium and 800IU of Vitamin D  Thank you for bringing your medicines today. This helps [place] keep you safe from mistakes.   Progress Toward Treatment Goals:  Treatment Goal 07/21/2014  Blood pressure at goal  Stop smoking smoking the same amount  Prevent falls -    Self Care Goals & Plans:  Self Care Goal 10/08/2014  Manage my medications take my medicines as prescribed; refill my medications on time; bring my medications to every visit  Monitor my health -  Eat healthy foods eat more vegetables; eat foods that are low in salt; eat baked foods instead of fried foods  Be physically active -  Stop smoking -  Meeting treatment goals -    No flowsheet data found.   Care Management & Community Referrals:  Referral 07/21/2014  Referrals made for care management support none needed  Referrals made to community resources none

## 2014-10-08 NOTE — Progress Notes (Signed)
[facility] Subjective:   Patient ID: Amanda Bennett female   DOB: [birth year] 59 y.o.   MRN: [id number]  HPI: [name] is a 59 y.o. female with a PMH below who presents for follow up for her acute on chronic low back pain.  She reports she has been doing better than when I last saw her.  She was initially taking the oxycodone frequently but now has decreased usage.  She reports she still has about 20 pills left of the 90 pills I ordered on 09/24/14.  Her lateral thigh numbness is improved but not completely resolved.  She did complete the Lumbar MRI.  She did see cardiology- Dr Fletcher Anon since I last saw her and apparently had a holter monitor for 48 hours for palpations.  She is not currently reporting any palpitations.    Past Medical History  Diagnosis Date  . Foot fracture, right 07/2010    per x-ray 07/2010 - oblique comminuted fifth metatarsal fracture, followed by Dr. Lorelei Pont  . Depression   . Insomnia   . GERD (gastroesophageal reflux disease)   . Carpal tunnel syndrome   . Cervical spinal stenosis 2008    per x-ray findings 2008  . Pulmonary nodule, right     RUL nodule, 7 mm noted on Chest XRAY - 06/2008, per CT follow up in 2009 - No suspicious pulmonary nodules or masse noted  . Cervicalgia   . Pain in limb   . Lumbago   . Chronic pain syndrome   . Cervical syndrome   . Primary localized osteoarthrosis, pelvic region and thigh   . Lesion of ulnar nerve   . Anxiety   . Osteoporosis   . Unexplained weight loss   . Visual disturbance   . Cough   . Arthritis pain   . Bruises easily   . Wound disruption, post-op, skin   . PONV (postoperative nausea and vomiting)     shortness of breath   . Hepatitis     hx of Hep C   . Wears glasses   . Dysrhythmia     prolonger QT   Current Outpatient Prescriptions  Medication Sig Dispense Refill  . albuterol (PROVENTIL HFA;VENTOLIN HFA) 108 (90 BASE) MCG/ACT inhaler Inhale 1-2 puffs into the  lungs every 6 (six) hours as needed for wheezing or shortness of breath.    Marland Kitchen amLODipine (NORVASC) 5 MG tablet Take 1 tablet (5 mg total) by mouth daily. 90 tablet 1  . aspirin 325 MG tablet Take 325 mg by mouth daily.    . cyclobenzaprine (FLEXERIL) 10 MG tablet Take 10 mg by mouth 3 (three) times daily as needed for muscle spasms.    Marland Kitchen gabapentin (NEURONTIN) 300 MG capsule Take 2 capsules (600 mg total) by mouth 4 (four) times daily. 120 capsule 3  . meloxicam (MOBIC) 15 MG tablet Take 1 tablet (15 mg total) by mouth daily. 30 tablet 0  . omeprazole (PRILOSEC) 20 MG capsule Take 20 mg by mouth daily.    Marland Kitchen oxyCODONE (ROXICODONE) 5 MG immediate release tablet Take 1.5 tablets (7.5 mg total) by mouth every 4 (four) hours as needed for severe pain. 90 tablet 0  . zolpidem (AMBIEN) 5 MG tablet Take 1 tablet (5 mg total) by mouth at bedtime as needed for sleep. 30 tablet 1   No current facility-administered medications for this visit.   Family History  Problem Relation Age of Onset  . Stroke Neg Hx   .  Stomach cancer Neg Hx   . Cancer Father     colon/prostate  . Colon cancer Father 6    deceased at age 42 from colon cancer  . Cancer Paternal Grandmother     ovarian   History   Social History  . Marital Status: Single    Spouse Name: N/A    Number of Children: N/A  . Years of Education: N/A   Social History Main Topics  . Smoking status: Light Tobacco Smoker -- 0.10 packs/day for 20 years    Types: Cigarettes    Last Attempt to Quit: 01/08/2011  . Smokeless tobacco: Never Used     Comment: pack lasts about a week./ DOWN TO 5 CIAGARETTE  . Alcohol Use: No  . Drug Use: No  . Sexual Activity: Not on file   Other Topics Concern  . Not on file   Social History Narrative   Financial assistance approved for 100% discount at [facility] and has [facility] card, also given [place] card.  per Bonna Gains 12/09/2009   Review of Systems: Review of Systems  Constitutional: Negative for fever  and chills.  Eyes: Negative for blurred vision.  Respiratory: Negative for shortness of breath.   Cardiovascular: Negative for chest pain.  Gastrointestinal: Negative for heartburn, abdominal pain and diarrhea.  Musculoskeletal: Positive for back pain. Negative for falls.  Neurological: Negative for dizziness and headaches.  Psychiatric/Behavioral: Negative for depression and substance abuse.     Objective:  Physical Exam: Filed Vitals:   10/08/14 1349  BP: 108/77  Pulse: 99  Temp: 98.3 F (36.8 C)  TempSrc: Oral  Height: 5\' 4"  (1.626 m)  Weight: 135 lb 4.8 oz (61.372 kg)  SpO2: 100%  Physical Exam  Constitutional: She is well-developed, well-nourished, and in no distress.  Cardiovascular: Normal rate, regular rhythm and normal heart sounds.   Pulmonary/Chest: Effort normal and breath sounds normal.  Abdominal: Soft. Bowel sounds are normal.  Musculoskeletal:  Mild tenderness over right trochanter, mild bilateral lumbar paraspinal tenderness (improved)  Neurological:  Gait wnl  Psychiatric: Affect normal.  Appears much less anxious/ in pain than previous two visits.  Nursing note and vitals reviewed.   Assessment & Plan:  Case discussed with Dr. Ellwood Dense  History of hepatitis C - Patient has completed treatment with Harvoni, LFTs wnl, mild enlargement of liver.  At this point she does not have a contraindication to tylenol and can take tylenol as needed for her low back pain.   Osteoporosis - MRI confirms no compression fracture or neural impingment.  She will need bisphosphonate therapy but currently does not have insurance. ( In the process of applying.) - Recommend 1200mg  Calcium and 800 IU of vitamin D. - Once patient gets insurance she would be a good candidate for reclast injections.   Bursitis of hip, right - Improved, continue Mobic.   Low back pain radiating to right leg - Reviewed MRI results with patient: progressive degenerative changes but no evidence  of neural impingement.   - No indication for long term Narcotic therapy, and patient appears to be improved from her acute on chronic episode of low back pain.  Ask that she wean off Oxycodone with her remaining 20 pills. - Advised patient that now that her hep C has been treated she can use tylenol as adjunctive therapy for her low back pain.     Medications Ordered Meds ordered this encounter  Medications  . cyclobenzaprine (FLEXERIL) 10 MG tablet    Sig: Take  10 mg by mouth 3 (three) times daily as needed for muscle spasms.   Other Orders No orders of the defined types were placed in this encounter.

## 2014-10-09 NOTE — Assessment & Plan Note (Signed)
-   Reviewed MRI results with patient: progressive degenerative changes but no evidence of neural impingement.   - No indication for long term Narcotic therapy, and patient appears to be improved from her acute on chronic episode of low back pain.  Ask that she wean off Oxycodone with her remaining 20 pills. - Advised patient that now that her hep C has been treated she can use tylenol as adjunctive therapy for her low back pain.

## 2014-10-09 NOTE — Assessment & Plan Note (Signed)
-   Improved, continue Mobic.

## 2014-10-09 NOTE — Assessment & Plan Note (Signed)
-   Patient has completed treatment with Harvoni, LFTs wnl, mild enlargement of liver.  At this point she does not have a contraindication to tylenol and can take tylenol as needed for her low back pain.

## 2014-10-09 NOTE — Assessment & Plan Note (Signed)
-   MRI confirms no compression fracture or neural impingment.  She will need bisphosphonate therapy but currently does not have insurance. ( In the process of applying.) - Recommend 1200mg  Calcium and 800 IU of vitamin D. - Once patient gets insurance she would be a good candidate for reclast injections.

## 2014-10-15 ENCOUNTER — Other Ambulatory Visit: Payer: Self-pay | Admitting: *Deleted

## 2014-10-15 ENCOUNTER — Other Ambulatory Visit: Payer: Self-pay | Admitting: Internal Medicine

## 2014-10-15 DIAGNOSIS — G4700 Insomnia, unspecified: Secondary | ICD-10-CM

## 2014-10-15 MED ORDER — ZOLPIDEM TARTRATE 5 MG PO TABS: 5.0000 mg | ORAL_TABLET | Freq: Every evening | ORAL | Status: DC | PRN

## 2014-10-18 NOTE — Telephone Encounter (Signed)
Rx called in 

## 2014-10-19 ENCOUNTER — Ambulatory Visit: Payer: Medicare Other | Attending: Orthopaedic Surgery | Admitting: Physical Therapy

## 2014-10-19 DIAGNOSIS — M545 Low back pain, unspecified: Secondary | ICD-10-CM

## 2014-10-19 DIAGNOSIS — M532X8 Spinal instabilities, sacral and sacrococcygeal region: Secondary | ICD-10-CM

## 2014-10-19 DIAGNOSIS — M533 Sacrococcygeal disorders, not elsewhere classified: Secondary | ICD-10-CM | POA: Diagnosis not present

## 2014-10-19 NOTE — Patient Instructions (Signed)
Posture Tips DO: - stand tall and erect - keep chin tucked in - keep head and shoulders in alignment - check posture regularly in mirror or large window - pull head back against headrest in car seat;  Change your position often.  Sit with lumbar support. DON'T: - slouch or slump while watching TV or reading - sit, stand or lie in one position  for too long;  Sitting is especially hard on the spine so if you sit at a desk/use the computer, then stand up often!   Copyright  VHI. All rights reserved.  Posture - Standing   Good posture is important. Avoid slouching and forward head thrust. Maintain curve in low back and align ears over shoul- ders, hips over ankles.  Pull your belly button in toward your back bone.   Copyright  VHI. All rights reserved.  Posture - Sitting   Sit upright, head facing forward. Try using a roll to support lower back. Keep shoulders relaxed, and avoid rounded back. Keep hips level with knees. Avoid crossing legs for long periods.   Copyright  VHI. All rights reserved.  Pelvic Tilt   Flatten back by tightening stomach muscles and buttocks. Repeat __10__ times per set. Do __1-2__ sets per session. Do __2__ sessions per day.  http://[email or web address].exer.us/135  Knee to Chest   Lying supine, bend involved knee to chest __3-5_ times. Repeat with other leg. Do __2_ times per day.  Copyright  VHI. All rights reserved.   Copyright  VHI. All rights reserved.  Spondylolisthesis with Rehab The slipping of one or multiple vertebrae out of the correct anatomical position is a condition known as spondylolisthesis. Spondylolisthesis is most common in adolescents and is caused by a number of different reasons, such as vertebral fracture or something you are born with (congenital). Spondylolisthesis is diagnosed with the use of X-rays. SYMPTOMS   Dull, achy pain in the lower back.  Pain that worsens with extension of the spine.  Tightness of the muscles on the back of  the thigh.  Lower back stiffness.  Signs of nerve damage: pain, numbness, or weakness affecting one or both lower extremities.  Muscle wasting (atrophy), uncommon.  Loss of stool (bowel) or urine (bladder) function. CAUSES  The symptoms of spondylolisthesis are caused by one or more vertebrae that are out of alignment, placing pressure on the spinal cord. Common mechanisms of injury include:  Congenital defect of the spine.  Degenerative process.  Stress fracture of the spine.  Fracture due to trauma to the spine. RISK INCREASES WITH:  Activities that have a risk of hyperextending the back.  Activities that have a risk of excessively rotating the spine.  Poor strength and flexibility.  Failure to warm up properly before activity.  Family history of spondylolysis or spondylolisthesis.  Improper sports technique. PREVENTION  Warm up and stretch properly before activity.  Allow for adequate recovery between workouts.  Maintain physical fitness:  Strength, flexibility, and endurance.  Cardiovascular fitness.  Learn and use proper technique. When possible, have a coach correct improper technique. PROGNOSIS  If treated properly, the spondylolisthesis usually resolves. RELATED COMPLICATIONS   Recurrent symptoms that result in a chronic problem.  Inability to compete in athletics.  Prolonged healing time, if improperly treated or reinjured.  Failure of the fracture to heal (nonunion).  Healing of the fracture in a poor position (malunion). TREATMENT Treatment initially involves resting from any activities that aggravate the symptoms and the use of ice and medications to help reduce  pain and inflammation. The use of strengthening and stretching exercises may help reduce pain with activity. These exercises may be performed at home or with referral to a therapist. It is important to learn how to use proper body mechanics as to not place undue stress on your spine. If  the injury is severe, then your caregiver may recommend a back brace to allow for healing, or even surgery. Surgery often involves fusing two adjacent vertebrae so no movement is allowed between them.  MEDICATION   If pain medication is necessary, then nonsteroidal anti-inflammatory medications, such as aspirin and ibuprofen, or other minor pain relievers, such as acetaminophen, are often recommended.  Do not take pain medication for 7 days before surgery.  Prescription pain relievers may be given if deemed necessary by your caregiver. Use only as directed and only as much as you need. HEAT AND COLD  Cold treatment (icing) relieves pain and reduces inflammation. Cold treatment should be applied for 10 to 15 minutes every 2 to 3 hours for inflammation and pain and immediately after any activity that aggravates your symptoms. Use ice packs or massage the area with a piece of ice (ice massage).  Heat treatment may be used prior to performing the stretching and strengthening activities prescribed by your caregiver, physical therapist, or athletic trainer. Use a heat pack or soak the injury in warm water. SEEK MEDICAL CARE IF:  Treatment seems to offer no benefit, or the condition worsens.  Any medications produce adverse side effects.  Any complications from surgery occur:  Pain, numbness, or coldness in the extremity operated upon.  Discoloration of the nail beds (they become blue or gray) of the extremity operated upon.  Signs of infections (fever, pain, inflammation, redness, or persistent bleeding). EXERCISES RANGE OF MOTION (ROM) AND STRETCHING EXERCISES - Spondylolisthesis Most people with low back pain will find that their symptoms worsen with either excessive bending forward (flexion) or arching at the low back (extension). The exercises which will help resolve your symptoms will focus on the opposite motion. Your physician, physical therapist or athletic trainer will help you  determine which exercises will be most helpful to resolve your low back pain. Do not complete any exercises without first consulting with your clinician. Discontinue any exercises which worsen your symptoms until you speak to your clinician. If you have pain, numbness or tingling which travels down into your buttocks, leg, or foot, the goal of the therapy is for these symptoms to move closer to your back and eventually resolve. Occasionally, these leg symptoms will get better, but your low back pain may worsen; this is typically an indication of progress in your rehabilitation. Be certain to be very alert to any changes in your symptoms and the activities in which you participated in the 24 hours prior to the change. Sharing this information with your clinician will allow him/her to most efficiently treat your condition. These exercises may help you when beginning to rehabilitate your injury. Your symptoms may resolve with or without further involvement from your physician, physical therapist or athletic trainer. While completing these exercises, remember:   Restoring tissue flexibility helps normal motion to return to the joints. This allows healthier, less painful movement and activity.  An effective stretch should be held for at least 30 seconds.  A stretch should never be painful. You should only feel a gentle lengthening or release in the stretched tissue. FLEXION RANGE OF MOTION AND STRETCHING EXERCISES: STRETCH - Flexion, Single Knee to Chest  Shanda Howells  on a firm bed or floor with both legs extended in front of you.  Keeping one leg in contact with the floor, bring your opposite knee to your chest. Hold your leg in place by either grabbing behind your thigh or at your knee.  Pull until you feel a gentle stretch in your low back. Hold __________ seconds. Slowly release your grasp and repeat the exercise with the opposite side. Repeat __________ times. Complete this exercise __________ times per day.   STRETCH - Flexion, Double Knee to Chest  Lie on a firm bed or floor with both legs extended in front of you.  Keeping one leg in contact with the floor, bring your opposite knee to your chest.  Tense your stomach muscles to support your back and then lift your other knee to your chest. Hold your legs in place by either grabbing behind your thighs or at your knees.  Pull both knees toward your chest until you feel a gentle stretch in your low back. Hold __________ seconds.  Tense your stomach muscles and slowly return one leg at a time to the floor. Repeat __________ times. Complete this exercise __________ times per day.  STRENGTHENING EXERCISES - Spondylolisthesis These exercises may help you when beginning to rehabilitate your injury. These exercises should be done near your "sweet spot." This is the neutral, low-back arch, somewhere between fully rounded and fully arched, that is your least painful position. When performed in this safe range of motion, these exercises can be used for people who have either a flexion or extension based injury. These exercises may resolve your symptoms with or without further involvement from your physician, physical therapist or athletic trainer. While completing these exercises, remember:   Muscles can gain both the endurance and the strength needed for everyday activities through controlled exercises.  Complete these exercises as instructed by your physician, physical therapist or athletic trainer. Progress the resistance and repetitions only as guided.  You may experience muscle soreness or fatigue, but the pain or discomfort you are trying to eliminate should never worsen during these exercises. If this pain does worsen, stop and make certain you are following the directions exactly. If the pain is still present after adjustments, discontinue the exercise until you can discuss the trouble with your clinician. STRENGTHENING - Deep Abdominals, Pelvic Tilt     Lie on a firm bed or floor. Keeping your legs in front of you, bend your knees so they are both pointed toward the ceiling and your feet are flat on the floor.  Tense your lower abdominal muscles to press your low back into the floor. This motion will rotate your pelvis so that your tail bone is scooping upwards rather than pointing at your feet or into the floor.  With a gentle tension and even breathing, hold this position for __________ seconds. Repeat __________ times. Complete this exercise __________ times per day.  STRENGTHENING - Abdominals, Crunches   Lie on a firm bed or floor. Keeping your legs in front of you, bend your knees so they are both pointed toward the ceiling and your feet are flat on the floor. Cross your arms over your chest.  Slightly tip your chin down without bending your neck.  Tense your abdominals and slowly lift your trunk high enough to just clear your shoulder blades. Lifting higher can put excessive stress on the low back and does not further strengthen your abdominal muscles.  Control your return to the starting position. Repeat __________ times.  Complete this exercise __________ times per day.  STRENGTHENING - Quadruped, Opposite UE/LE Lift   Assume a hands and knees position on a firm surface. Keep your hands under your shoulders and your knees under your hips. You may place padding under your knees for comfort.  Find your neutral spine and gently tense your abdominal muscles so that you can maintain this position. Your shoulders and hips should form a rectangle that is parallel with the floor and is not twisted.  Keeping your trunk steady, lift your right hand no higher than your shoulder and then your left leg no higher than your hip. Make sure you are not holding your breath. Hold this position __________ seconds.  Continuing to keep your abdominal muscles tense and your back steady, slowly return to your starting position. Repeat with the opposite  arm and leg. Repeat __________ times. Complete this exercise __________ times per day.  STRENGTHENING - Lower Abdominals, Double Knee Lift  Lie on a firm bed or floor. Keeping your legs in front of you, bend your knees so they are both pointed toward the ceiling and your feet are flat on the floor.  Tense your abdominal muscles to brace your low back and slowly lift both of your knees until they come over your hips. Be certain not to hold your breath.  Hold __________ seconds. Using your abdominal muscles, return to the starting position in a slow and controlled manner. Repeat __________ times. Complete this exercise __________ times per day.  POSTURE AND BODY MECHANICS CONSIDERATIONS - Spondylolisthesis Keeping correct posture when sitting, standing or completing your activities will reduce the stress put on different body tissues, allowing injured tissues a chance to heal and limiting painful experiences. The following are general guidelines for improved posture. Your physician or physical therapist will provide you with any instructions specific to your needs. While reading these guidelines, remember:  The exercises prescribed by your provider will help you have the flexibility and strength to maintain correct postures.  The correct posture provides the optimal environment for your joints to work. All of your joints have less wear and tear when properly supported by a spine with good posture. This means you will experience a healthier, less painful body.  Correct posture must be practiced with all of your activities, especially prolonged sitting and standing. Correct posture is as important when doing repetitive low-stress activities (typing) as it is when doing a single heavy-load activity (lifting). PROPER SITTING POSTURE In order to minimize stress and discomfort on your spine, you must sit with correct posture. Sitting with good posture should be effortless for a healthy body. Returning to  good posture is a gradual process. Many people can work toward this most comfortably by using various supports until they have the flexibility and strength to maintain this posture on their own. When sitting with proper posture, your ears will fall over your shoulders and your shoulders will fall over your hips. You should use the back of the chair to support your upper back. Your low back will be in a neutral position, just slightly arched. You may place a small pillow or folded towel at the base of your low back for  support.  When working at a desk, create an environment that supports good, upright posture. Without extra support, muscles fatigue and lead to excessive strain on joints and other tissues. Keep these recommendations in mind: CHAIR:  A chair should be able to slide under your desk when your back makes contact  with the back of the chair. This allows you to work closely.  The chair's height should allow your eyes to be level with the upper part of your monitor and your hands to be slightly lower than your elbows. BODY POSITION  Your feet should make contact with the floor. If this is not possible, use a foot rest.  Keep your ears over your shoulders. This will reduce stress on your neck and low back. INCORRECT SITTING POSTURES If you are feeling tired and unable to assume a healthy sitting posture, do not slouch or slump. This puts excessive strain on your back tissues, causing more damage and pain. Healthier options include:  Using more support, like a lumbar pillow.  Switching tasks to something that requires you to be upright or walking.  Taking a brief walk.  Lying down to rest in a neutral-spine position.  CORRECT LIFTING TECHNIQUES DO:   Assume a wide stance. This will provide you more stability and the opportunity to get as close as possible to the object which you are lifting.  Tense your abdominals to brace your spine; then bend at the knees and hips. Keeping your  back locked in a neutral-spine position, lift using your leg muscles. Lift with your legs, keeping your back straight.  Test the weight of unknown objects before attempting to lift them.  Try to keep your elbows locked down at your sides in order get the best strength from your shoulders when carrying an object.  Always ask for help when lifting heavy or awkward objects. INCORRECT LIFTING TECHNIQUES DO NOT:   Lock your knees when lifting, even if it is a small object.  Bend and twist. Pivot at your feet or move your feet when needing to change directions.  Assume that you cannot safely pick up a paperclip without proper posture. Document Released: 08/27/2005 Document Revised: 01/11/2014 Document Reviewed: 12/09/2008 [facility] Patient Information 2015 [place], [place]. This information is not intended to replace advice given to you by your health care provider. Make sure you discuss any questions you have with your health care provider.

## 2014-10-20 NOTE — Therapy (Addendum)
[place], [place], [address] Phone: [phone]   Fax:  [phone]  Physical Therapy Evaluation/DC  Patient Details  Name: Amanda Bennett MRN: [id number] Date of Birth: 07-01-1956 Referring Provider:  Garald Balding, MD  Encounter Date: 10/19/2014      PT End of Session - 10/19/14 1435    Visit Number 1   Number of Visits 12   Date for PT Re-Evaluation 11/30/14   PT Start Time 1330   PT Stop Time 1413   PT Time Calculation (min) 43 min   Activity Tolerance Patient tolerated treatment well      Past Medical History  Diagnosis Date  . Foot fracture, right 07/2010    per x-ray 07/2010 - oblique comminuted fifth metatarsal fracture, followed by Dr. Lorelei Pont  . Depression   . Insomnia   . GERD (gastroesophageal reflux disease)   . Carpal tunnel syndrome   . Cervical spinal stenosis 2008    per x-ray findings 2008  . Pulmonary nodule, right     RUL nodule, 7 mm noted on Chest XRAY - 06/2008, per CT follow up in 2009 - No suspicious pulmonary nodules or masse noted  . Cervicalgia   . Pain in limb   . Lumbago   . Chronic pain syndrome   . Cervical syndrome   . Primary localized osteoarthrosis, pelvic region and thigh   . Lesion of ulnar nerve   . Anxiety   . Osteoporosis   . Unexplained weight loss   . Visual disturbance   . Cough   . Arthritis pain   . Bruises easily   . Wound disruption, post-op, skin   . PONV (postoperative nausea and vomiting)     shortness of breath   . Hepatitis     hx of Hep C   . Wears glasses   . Dysrhythmia     prolonger QT    Past Surgical History  Procedure Laterality Date  . Abdominal hysterectomy    . Finger amputated      right ring  . Drain in left ring finger      cat bite  . Broken clavical    . Cholecystectomy    . Hemorrhoid surgery  11/25/2006  . Cholecystectomy  10/30/2011    Procedure: LAPAROSCOPIC CHOLECYSTECTOMY WITH INTRAOPERATIVE  CHOLANGIOGRAM;  Surgeon: Imogene Burn. Georgette Dover, MD;  Location: [facility];  Service: General;  Laterality: N/A;  . Carpal tunnel release      right and left  . Colonoscopy    . Inguinal lymph node biopsy Right 02/03/2014    Procedure: INGUINAL LYMPH NODE BIOPSY;  Surgeon: Zenovia Jarred, MD;  Location: [facility];  Service: General;  Laterality: Right;  right inguinal area    LMP 10/23/2002  Visit Diagnosis:  Midline low back pain without sciatica  Sacroiliac joint dysfunction of right side  Sacroiliac instability      Subjective Assessment - 10/19/14 1337    Symptoms Pain in low back and into Rt. hip.  C/O weakness in both legs and sensory changes in low back, denies in LE.     Pertinent History chronic LBP, pelvic fracture 2000 from horseback riding   Limitations Standing;Walking;Sitting;Lifting;House hold activities  getting up from lower seat   How long can you sit comfortably? not really ever- pillows   How long can you stand comfortably? 5-10 min    How long can you walk comfortably? 5-10 min   Diagnostic  tests MRI   Patient Stated Goals want to be able to avoid surgery   Currently in Pain? Yes   Pain Score 6    Pain Location Back   Pain Orientation Lower;Right;Left   Pain Descriptors / Indicators Aching;Numbness   Pain Type Chronic pain   Pain Radiating Towards Rt hip   Pain Onset Other (comment)  increased last 2 mos   Pain Frequency Constant   Aggravating Factors  prolonged positions   Pain Relieving Factors pillows, Tylenol, heat   Multiple Pain Sites No          [facility] PT Assessment - 10/19/14 1344    Assessment   Medical Diagnosis spondylolisthesis   Next MD Visit Dr. Lucia Gaskins for injection   Prior Therapy None   Precautions   Precautions None   Restrictions   Weight Bearing Restrictions No   Balance Screen   Has the patient fallen in the past 6 months Yes   How many times? 1   Has the patient had a decrease in activity level because of a fear  of falling?  Yes   Is the patient reluctant to leave their home because of a fear of falling?  Yes   Sensation   Light Touch Impaired by gross assessment   Posture/Postural Control   Postural Limitations Forward head;Increased lumbar lordosis;Increased thoracic kyphosis;Anterior pelvic tilt   Posture Comments --  L PSIS and L iliac crest high   AROM   Lumbar Flexion 35    Lumbar Extension 50   Lumbar - Right Side Bend WNL   Lumbar - Left Side Bend WNL   Lumbar - Right Rotation WNL   Lumbar - Left Rotation WNL   Strength   Right Hip Flexion 3+/5   Left Hip Flexion 4/5   Right Knee Flexion 5/5   Right Knee Extension 5/5   Left Knee Flexion 5/5   Left Knee Extension 5/5   Right Ankle Dorsiflexion 3+/5   Left Ankle Dorsiflexion 4+/5   Palpation   Palpation tender Rt. PSIS          [facility] Adult PT Treatment/Exercise - 10/19/14 1344    Lumbar Exercises: Stretches   Single Knee to Chest Stretch 5 reps   Pelvic Tilt 5 reps     Manual PT: MET correction for ilial rotation: Rt. Posterior rotation Performed on Rt with increased groin pain (resisted hip flexion) Tried resisted hip ext and knee flexion on Rt. Side with less difficulty.  Patient more level viewed anteriorly.  Reported less pain overall and exercises "felt good" following assessment.       PT Education - 10/19/14 1434    Education provided Yes   Education Details PT/POC, HEP and posture, spondylolisthesis, core   Person(s) Educated Patient   Methods Explanation;Demonstration;Handout   Comprehension Verbalized understanding;Returned demonstration;Need further instruction             PT Long Term Goals - 10/20/14 0826    PT LONG TERM GOAL #1   Title Pt will be I with HEP for core stabilization   Time 4   Period Weeks   Status New   PT LONG TERM GOAL #2   Title Pt will be able to walk as needed for 20-30 min with mild pain increase.    Time 4   Period Weeks   Status New   PT LONG TERM GOAL #3   Title  Pt will be able to sit for 30 min and have a meal with  min discomfort   Time 4   Period Weeks   Status New   PT LONG TERM GOAL #4   Title Pt will be to demo hip strength 4/5 in all planes for improved transfers, stairs   Time 4   Period Weeks   Status New           Plan - Nov 15, 2014 2204    Clinical Impression Statement Patient presents with pain related to spondylolisthesis and malalignment of SIJ.  She was given initial HEP and info on posture, alignment.  She would benefit from skilled PT to improve functional mobility for more than this one visit. Please advise if you agree with POC   Pt will benefit from skilled therapeutic intervention in order to improve on the following deficits Decreased mobility;Difficulty walking;Impaired sensation;Pain;Improper body mechanics;Decreased range of motion;Decreased activity tolerance;Decreased strength;Impaired flexibility   Rehab Potential Good   PT Frequency 2x / week   PT Duration 4 weeks   PT Treatment/Interventions ADLs/Self Care Home Management;Electrical Stimulation;Gait training;Therapeutic exercise;Patient/family education;Stair training;Balance training;Moist Heat;Functional mobility training;Neuromuscular re-education;Manual techniques;Passive range of motion;Cryotherapy;Ultrasound;Therapeutic activities   PT Next Visit Plan check MET and continue with flexion based lumbar.  begin stab in neutral.    PT Home Exercise Plan MET correction, pelvic tilt and single knee to chest.    Consulted and Agree with Plan of Care Patient          G-Codes - November 15, 2014 12/03/2207    Functional Assessment Tool Used clin judgement   Functional Limitation Changing and maintaining body position   Changing and Maintaining Body Position Current Status [phone]) At least 40 percent but less than 60 percent impaired, limited or restricted   Changing and Maintaining Body Position Goal Status (L5974) At least 20 percent but less than 40 percent impaired, limited or  restricted      DC Changing and maintaining position same CK (40-60%)   Problem List Patient Active Problem List   Diagnosis Date Noted  . Palpitations 10/01/2014  . Tobacco abuse 07/23/2014  . Mouth lesion 07/23/2014  . Sore throat 07/23/2014  . Postoperative wound infection 02/13/2014  . Inguinal lymphadenopathy 12/30/2013  . Family history of long QT syndrome 09/18/2013  . Chest pain on exertion 09/18/2013  . Rash and nonspecific skin eruption 09/18/2013  . Leg pain 09/18/2013  . Right groin pain 06/18/2013  . Preventative health care 06/18/2013  . CAFL (chronic airflow limitation) 04/10/2013  . BP (high blood pressure) 04/10/2013  . Closed traumatic PIP dislocation 04/07/2013  . Abnormality of gait 12/16/2012  . Asthma 08/21/2012  . Myofascial pain dysfunction syndrome 06/25/2012  . Bursitis of hip, right 04/09/2012  . Hypertension 04/09/2012  . Headache(784.0) 04/09/2012  . Hyperlipidemia 04/09/2012  . Low back pain radiating to right leg 12/26/2011  . Lumbar spondylosis 12/26/2011  . Localized primary carpometacarpal osteoarthritis 11/07/2011  . Chronic neck pain 11/07/2011  . Chronic calculus cholecystitis 10/03/2011  . Skin lesion of right arm 10/02/2011  . Osteoporosis 10/02/2011  . Gallstone 10/02/2011  . Hot flash, menopausal 01/17/2011  . History of hepatitis C 06/15/2010  . CARPAL TUNNEL SYNDROME, BILATERAL 12/06/2009  . GERD 06/22/2009  . Depression with anxiety 05/25/2009  . [place] DISEASE, CERVICAL 05/25/2009  . Insomnia 05/25/2009    Jayren Cease 10/20/2014, 8:32 AM  [facility] [address] [place], [place], [address] Phone: [phone]   Fax:  [phone]   Raeford Razor, PT 10/20/2014 8:35 AM Phone: [phone] Fax: [phone]  PHYSICAL THERAPY DISCHARGE  SUMMARY  Visits from Start of Care: 1  Current functional level related to goals / functional outcomes: Unknown did not present  for PT   Remaining deficits: Unknown   Education / Equipment: PT, HEP< SIJ dysfunciton Plan: Patient agrees to discharge.  Patient goals were not met. Patient is being discharged due to not returning since the last visit.  ?????    Raeford Razor, PT 07/14/2015 4:14 PM Phone: [phone] Fax: [phone]

## 2014-10-21 ENCOUNTER — Telehealth: Payer: Self-pay | Admitting: *Deleted

## 2014-10-21 NOTE — Telephone Encounter (Signed)
Informed patient per Dr. Fletcher Anon  Holter showed NSR with rare PAC's and PVC's  No significant arrhythmia    Patient verbalized understanding

## 2014-10-22 ENCOUNTER — Ambulatory Visit (INDEPENDENT_AMBULATORY_CARE_PROVIDER_SITE_OTHER): Payer: Medicaid Other

## 2014-10-22 DIAGNOSIS — R002 Palpitations: Secondary | ICD-10-CM

## 2014-12-20 ENCOUNTER — Encounter: Payer: Self-pay | Admitting: Internal Medicine

## 2014-12-20 ENCOUNTER — Ambulatory Visit (INDEPENDENT_AMBULATORY_CARE_PROVIDER_SITE_OTHER): Payer: Medicare Other | Admitting: Internal Medicine

## 2014-12-20 VITALS — BP 123/86 | HR 81 | Temp 98.2°F | Ht 64.0 in | Wt 131.7 lb

## 2014-12-20 DIAGNOSIS — M545 Low back pain: Secondary | ICD-10-CM

## 2014-12-20 DIAGNOSIS — M810 Age-related osteoporosis without current pathological fracture: Secondary | ICD-10-CM | POA: Diagnosis not present

## 2014-12-20 DIAGNOSIS — G4700 Insomnia, unspecified: Secondary | ICD-10-CM

## 2014-12-20 DIAGNOSIS — M5442 Lumbago with sciatica, left side: Secondary | ICD-10-CM

## 2014-12-20 DIAGNOSIS — M47896 Other spondylosis, lumbar region: Secondary | ICD-10-CM

## 2014-12-20 DIAGNOSIS — M5441 Lumbago with sciatica, right side: Secondary | ICD-10-CM

## 2014-12-20 MED ORDER — CYCLOBENZAPRINE HCL 10 MG PO TABS: 10.0000 mg | ORAL_TABLET | Freq: Three times a day (TID) | ORAL | Status: DC | PRN

## 2014-12-20 MED ORDER — OXYCODONE HCL 5 MG PO TABS: 7.5000 mg | ORAL_TABLET | ORAL | Status: DC | PRN

## 2014-12-20 NOTE — Assessment & Plan Note (Addendum)
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 Had injections several times by orthopaedics. Ortho recommended neurosurgery. Dr. Durward Fortes discussed this with her. Has appt with NGY for possible fusion on 12/03/14.   Currently getting hydrocodone from [place]. It's not helping with the pain. It makes her nauseas. She stated in the past Dr. Heber [place] gave a short supply of oxycodone which did help. Wants to try that again.  I offered whether she wanted [place] to manage her pain or ortho, she chose [place]. I called [facility] to not give her any pain meds since we will provide it from now.   Filled oxycodone 5 mg #90 tabs (to take 7.5mg  as needed). This should also help with the sleep. May consider restoril next visit if it doesn't. Will sign contract in the future.  Also refilled flexiril (it helps). Stated gabapentin and mobic doesn't help. I dc/ed those from her list of meds.   Has neurosurgery appt on 12/03/14

## 2014-12-20 NOTE — Progress Notes (Signed)
Internal Medicine Clinic Attending  Case discussed with Dr. Ahmed at the time of the visit.  We reviewed the resident's history and exam and pertinent patient test results.  I agree with the assessment, diagnosis, and plan of care documented in the resident's note. 

## 2014-12-20 NOTE — Assessment & Plan Note (Addendum)
dexa 09/2014 Tscore -2.7  Seen her dentist (her sister) yesterday. Understands risk of reclast and osteonecrosis of jaw. Went over this with her dentist. Provided her information about Reclast.   Ordered 5mg  reclast for short stay admin. rec is q2year.

## 2014-12-20 NOTE — Patient Instructions (Signed)
General Instructions:   Follow up with me as needed for refills. See your neurosurgeon and let us know what we can do in the mean time with your pain.  Gave you oxycodone for #90 tablets.   Thank you for bringing your medicines today. This helps [place] keep you safe from mistakes.   Progress Toward Treatment Goals:  Treatment Goal 07/21/2014  Blood pressure at goal  Stop smoking smoking the same amount  Prevent falls -    Self Care Goals & Plans:  Self Care Goal 12/20/2014  Manage my medications take my medicines as prescribed; bring my medications to every visit; refill my medications on time  Monitor my health -  Eat healthy foods drink diet soda or water instead of juice or soda; eat more vegetables; eat foods that are low in salt  Be physically active take a walk every day  Stop smoking -  Meeting treatment goals -    No flowsheet data found.   Care Management & Community Referrals:  Referral 07/21/2014  Referrals made for care management support none needed  Referrals made to community resources none

## 2014-12-22 ENCOUNTER — Encounter: Payer: Self-pay | Admitting: Internal Medicine

## 2014-12-22 NOTE — Progress Notes (Signed)
   Subjective:    Patient ID: Amanda Bennett, female    DOB: 05-Feb-1956, 59 y.o.   MRN: [id number]  HPI  59 yo female with DJD with chronic back pain, osteoporesis, here for follow up of her chronic back pain.  Having chronic back pain that's unchanged. Will see neurosurgery soon. Has been following ortho, got injections several times. Pain is on the back with radiation down both legs passed her knees. No bowel or bladder dysfunction. No weakness or numbness, but has some problem with balance due to pain.  Has insomnia 2/2 to pain. Wants to get osteoporesis treatment done before her surgery (not scheduled yet, will see [facility] soon).  Saw her dentist recently. No plans for tooth extraction, has discussed reclast with her.    Review of Systems  Constitutional: Negative for fever, chills and fatigue.  HENT: Negative for congestion and sore throat.   Eyes: Negative for visual disturbance.  Respiratory: Negative for chest tightness, shortness of breath and wheezing.   Cardiovascular: Negative for chest pain and palpitations.  Gastrointestinal: Negative for nausea, vomiting, diarrhea and abdominal distention.  Endocrine: Negative.   Genitourinary: Negative for dysuria, vaginal pain and dyspareunia.  Musculoskeletal: Positive for back pain. Negative for joint swelling.  Skin: Negative.   Allergic/Immunologic: Negative.   Neurological: Negative for dizziness and weakness.  Hematological: Negative.   Psychiatric/Behavioral: Positive for sleep disturbance.       Objective:   Physical Exam  Constitutional: She is oriented to person, place, and time. She appears well-developed and well-nourished. No distress.  HENT:  Head: Normocephalic and atraumatic.  Right Ear: External ear normal.  Left Ear: External ear normal.  Mouth/Throat: Oropharynx is clear and moist. No oropharyngeal exudate.  Eyes: Conjunctivae and EOM are normal. Pupils are equal, round, and reactive to light.  Neck:  Neck supple.  Cardiovascular: Normal rate and regular rhythm.  Exam reveals no gallop and no friction rub.   No murmur heard. Pulmonary/Chest: Effort normal and breath sounds normal. No respiratory distress. She has no wheezes. She has no rales. She exhibits no tenderness.  Abdominal: Soft. Bowel sounds are normal. She exhibits no distension and no mass.  Musculoskeletal: Normal range of motion. She exhibits no edema or tenderness.  5/5 str both legs. No TTP, no effusion. FROM.  Neurological: She is alert and oriented to person, place, and time. No cranial nerve deficit. Coordination normal.  Skin: Skin is warm and dry. No rash noted. She is not diaphoretic. No erythema.   Filed Vitals:   12/20/14 1524  BP: 123/86  Pulse: 81  Temp: 98.2 F (36.8 C)         Assessment & Plan:  See problem based a&p.

## 2014-12-22 NOTE — Assessment & Plan Note (Signed)
Cannot sleep at night due to pain. Hydrocodone is not managing it very well. Oxycodone in the past helped. I stated we will focus on pain control for now to see if that helps with her sleep. Amanda Bennett may consider restoril in the future.

## 2015-01-13 ENCOUNTER — Other Ambulatory Visit (HOSPITAL_COMMUNITY): Payer: Self-pay | Admitting: Orthopaedic Surgery

## 2015-01-13 ENCOUNTER — Other Ambulatory Visit (HOSPITAL_COMMUNITY): Payer: Self-pay | Admitting: *Deleted

## 2015-01-14 ENCOUNTER — Encounter (HOSPITAL_COMMUNITY)
Admission: RE | Admit: 2015-01-14 | Discharge: 2015-01-14 | Disposition: A | Discharge status: Home / Self Care | Payer: Medicare Other | Source: Ambulatory Visit | Attending: Internal Medicine | Admitting: Internal Medicine

## 2015-01-14 VITALS — BP 148/94 | HR 61 | Temp 98.3°F | Resp 20 | Ht 64.0 in | Wt 130.0 lb

## 2015-01-14 DIAGNOSIS — M810 Age-related osteoporosis without current pathological fracture: Secondary | ICD-10-CM | POA: Diagnosis present

## 2015-01-14 LAB — CREATININE, SERUM
Creatinine, Ser: 0.64 mg/dL (ref 0.44–1.00)
GFR calc Af Amer: >60 mL/min (ref >60)
GFR calc non Af Amer: >60 mL/min (ref >60)

## 2015-01-14 LAB — CALCIUM: Calcium: 9.9 mg/dL (ref 8.9–10.3)

## 2015-01-14 MED ORDER — ZOLEDRONIC ACID 5 MG/100ML IV SOLN
5.0000 mg | Freq: Once | INTRAVENOUS | Status: AC
  Administered 2015-01-14: 5 mg via INTRAVENOUS

## 2015-01-14 MED ORDER — ZOLEDRONIC ACID 5 MG/100ML IV SOLN
INTRAVENOUS | Status: AC
  Administered 2015-01-14: 5 mg via INTRAVENOUS
  Filled 2015-01-14: qty 100

## 2015-01-14 NOTE — Discharge Instructions (Signed)
Ibandronate injection °What is this medicine? °IBANDRONATE (i BAN droh nate) slows calcium loss from bones. It is used to treat osteoporosis in women past the age of menopause. °This medicine may be used for other purposes; ask your health care provider or pharmacist if you have questions. °COMMON BRAND NAME(S): Boniva °What should I tell my health care provider before I take this medicine? °They need to know if you have any of these conditions: °-dental disease °-kidney disease °-low levels of calcium in the blood °-low levels of vitamin D in the blood °-an unusual or allergic reaction to ibandronate, other medicines, foods, dyes, or preservatives °-pregnant or trying to get pregnant °-breast-feeding °How should I use this medicine? °This medicine is for injection into a vein. It is given by a health care professional in a hospital or clinic setting. °Talk to your pediatrician regarding the use of this medicine in children. Special care may be needed. °Overdosage: If you think you have taken too much of this medicine contact a poison control center or emergency room at once. °NOTE: This medicine is only for you. Do not share this medicine with others. °What if I miss a dose? °It is important not to miss your dose. Call your doctor or health care professional if you are unable to keep an appointment. °What may interact with this medicine? °-teriparatide °This list may not describe all possible interactions. Give your health care provider a list of all the medicines, herbs, non-prescription drugs, or dietary supplements you use. Also tell them if you smoke, drink alcohol, or use illegal drugs. Some items may interact with your medicine. °What should I watch for while using this medicine? °Visit your doctor or health care professional for regular check ups. It may be some time before you see the benefit from this medicine. Do not stop taking your medicine except on your doctor's advice. Your doctor or health care  professional may order blood tests and other tests to see how you are doing. °You should make sure you get enough calcium and vitamin D while you are taking this medicine, unless your doctor tells you not to. Discuss the foods you eat and the vitamins you take with your health care professional. °Some people who take this medicine have severe bone, joint, and/or muscle pain. This medicine may also increase your risk for a broken thigh bone. Tell your doctor right away if you have pain in your upper leg or groin. Tell your doctor if you have any pain that does not go away or that gets worse. °What side effects may I notice from receiving this medicine? °Side effects that you should report to your doctor or health care professional as soon as possible: °-allergic reactions such as skin rash or itching, hives, swelling of the face, lips, throat, or tongue °-changes in vision °-chest pain °-fever, flu-like symptoms °-heartburn or stomach pain °-jaw pain, especially after dental work °Side effects that usually do not require medical attention (report to your doctor or health care professional if they continue or are bothersome): °-bone, muscle or joint pain °-diarrhea or constipation °-eye pain or itching °-headache °-irritation at site where injected °-nausea °This list may not describe all possible side effects. Call your doctor for medical advice about side effects. You may report side effects to FDA at 1-800-FDA-1088. °Where should I keep my medicine? °This drug is given in a hospital or clinic and will not be stored at home. °NOTE: This sheet is a summary. It may not   cover all possible information. If you have questions about this medicine, talk to your doctor, pharmacist, or health care provider.  2015, [employer]. (2011-02-23 09:02:20) Zoledronic Acid injection (Paget's Disease, Osteoporosis) What is this medicine? ZOLEDRONIC ACID (ZOE le dron ik AS id) lowers the amount of calcium loss from bone.  It is used to treat Paget's disease and osteoporosis in women. This medicine may be used for other purposes; ask your health care provider or pharmacist if you have questions. COMMON BRAND NAME(S): Reclast, Zometa What should I tell my health care provider before I take this medicine? They need to know if you have any of these conditions: -aspirin-sensitive asthma -cancer, especially if you are receiving medicines used to treat cancer -dental disease or wear dentures -infection -kidney disease -low levels of calcium in the blood -past surgery on the parathyroid gland or intestines -receiving corticosteroids like dexamethasone or prednisone -an unusual or allergic reaction to zoledronic acid, other medicines, foods, dyes, or preservatives -pregnant or trying to get pregnant -breast-feeding How should I use this medicine? This medicine is for infusion into a vein. It is given by a health care professional in a hospital or clinic setting. Talk to your pediatrician regarding the use of this medicine in children. This medicine is not approved for use in children. Overdosage: If you think you have taken too much of this medicine contact a poison control center or emergency room at once. NOTE: This medicine is only for you. Do not share this medicine with others. What if I miss a dose? It is important not to miss your dose. Call your doctor or health care professional if you are unable to keep an appointment. What may interact with this medicine? -certain antibiotics given by injection -NSAIDs, medicines for pain and inflammation, like ibuprofen or naproxen -some diuretics like bumetanide, furosemide -teriparatide This list may not describe all possible interactions. Give your health care provider a list of all the medicines, herbs, non-prescription drugs, or dietary supplements you use. Also tell them if you smoke, drink alcohol, or use illegal drugs. Some items may interact with your  medicine. What should I watch for while using this medicine? Visit your doctor or health care professional for regular checkups. It may be some time before you see the benefit from this medicine. Do not stop taking your medicine unless your doctor tells you to. Your doctor may order blood tests or other tests to see how you are doing. Women should inform their doctor if they wish to become pregnant or think they might be pregnant. There is a potential for serious side effects to an unborn child. Talk to your health care professional or pharmacist for more information. You should make sure that you get enough calcium and vitamin D while you are taking this medicine. Discuss the foods you eat and the vitamins you take with your health care professional. Some people who take this medicine have severe bone, joint, and/or muscle pain. This medicine may also increase your risk for jaw problems or a broken thigh bone. Tell your doctor right away if you have severe pain in your jaw, bones, joints, or muscles. Tell your doctor if you have any pain that does not go away or that gets worse. Tell your dentist and dental surgeon that you are taking this medicine. You should not have major dental surgery while on this medicine. See your dentist to have a dental exam and fix any dental problems before starting this medicine. Take good care  of your teeth while on this medicine. Make sure you see your dentist for regular follow-up appointments. What side effects may I notice from receiving this medicine? Side effects that you should report to your doctor or health care professional as soon as possible: -allergic reactions like skin rash, itching or hives, swelling of the face, lips, or tongue -anxiety, confusion, or depression -breathing problems -changes in vision -eye pain -feeling faint or lightheaded, falls -jaw pain, especially after dental work -mouth sores -muscle cramps, stiffness, or weakness -trouble  passing urine or change in the amount of urine Side effects that usually do not require medical attention (report to your doctor or health care professional if they continue or are bothersome): -bone, joint, or muscle pain -constipation -diarrhea -fever -hair loss -irritation at site where injected -loss of appetite -nausea, vomiting -stomach upset -trouble sleeping -trouble swallowing -weak or tired This list may not describe all possible side effects. Call your doctor for medical advice about side effects. You may report side effects to FDA at 1-800-FDA-1088. Where should I keep my medicine? This drug is given in a hospital or clinic and will not be stored at home. NOTE: This sheet is a summary. It may not cover all possible information. If you have questions about this medicine, talk to your doctor, pharmacist, or health care provider.  2015, [employer]. (2013-02-09 10:03:48)

## 2015-01-20 ENCOUNTER — Emergency Department (HOSPITAL_COMMUNITY): Payer: Medicare Other

## 2015-01-20 ENCOUNTER — Emergency Department (HOSPITAL_COMMUNITY)
Admission: EM | Admit: 2015-01-20 | Discharge: 2015-01-20 | Disposition: A | Discharge status: Home / Self Care | Payer: Medicare Other | Attending: Emergency Medicine | Admitting: Emergency Medicine

## 2015-01-20 ENCOUNTER — Encounter (HOSPITAL_COMMUNITY): Payer: Self-pay | Admitting: Emergency Medicine

## 2015-01-20 DIAGNOSIS — Y9289 Other specified places as the place of occurrence of the external cause: Secondary | ICD-10-CM | POA: Insufficient documentation

## 2015-01-20 DIAGNOSIS — F419 Anxiety disorder, unspecified: Secondary | ICD-10-CM | POA: Insufficient documentation

## 2015-01-20 DIAGNOSIS — W1839XA Other fall on same level, initial encounter: Secondary | ICD-10-CM | POA: Insufficient documentation

## 2015-01-20 DIAGNOSIS — Z79899 Other long term (current) drug therapy: Secondary | ICD-10-CM | POA: Diagnosis not present

## 2015-01-20 DIAGNOSIS — M810 Age-related osteoporosis without current pathological fracture: Secondary | ICD-10-CM | POA: Insufficient documentation

## 2015-01-20 DIAGNOSIS — Z973 Presence of spectacles and contact lenses: Secondary | ICD-10-CM | POA: Insufficient documentation

## 2015-01-20 DIAGNOSIS — S6992XA Unspecified injury of left wrist, hand and finger(s), initial encounter: Secondary | ICD-10-CM | POA: Diagnosis present

## 2015-01-20 DIAGNOSIS — Y998 Other external cause status: Secondary | ICD-10-CM | POA: Insufficient documentation

## 2015-01-20 DIAGNOSIS — S3992XA Unspecified injury of lower back, initial encounter: Secondary | ICD-10-CM | POA: Insufficient documentation

## 2015-01-20 DIAGNOSIS — G8929 Other chronic pain: Secondary | ICD-10-CM | POA: Insufficient documentation

## 2015-01-20 DIAGNOSIS — Z8679 Personal history of other diseases of the circulatory system: Secondary | ICD-10-CM | POA: Diagnosis not present

## 2015-01-20 DIAGNOSIS — M545 Low back pain, unspecified: Secondary | ICD-10-CM

## 2015-01-20 DIAGNOSIS — Y9389 Activity, other specified: Secondary | ICD-10-CM | POA: Insufficient documentation

## 2015-01-20 DIAGNOSIS — G4700 Insomnia, unspecified: Secondary | ICD-10-CM | POA: Diagnosis not present

## 2015-01-20 DIAGNOSIS — S63502A Unspecified sprain of left wrist, initial encounter: Secondary | ICD-10-CM | POA: Diagnosis not present

## 2015-01-20 DIAGNOSIS — Z8739 Personal history of other diseases of the musculoskeletal system and connective tissue: Secondary | ICD-10-CM | POA: Diagnosis not present

## 2015-01-20 DIAGNOSIS — F329 Major depressive disorder, single episode, unspecified: Secondary | ICD-10-CM | POA: Insufficient documentation

## 2015-01-20 DIAGNOSIS — Z87891 Personal history of nicotine dependence: Secondary | ICD-10-CM | POA: Insufficient documentation

## 2015-01-20 DIAGNOSIS — K219 Gastro-esophageal reflux disease without esophagitis: Secondary | ICD-10-CM | POA: Insufficient documentation

## 2015-01-20 MED ORDER — NAPROXEN 250 MG PO TABS: 250.0000 mg | ORAL_TABLET | Freq: Two times a day (BID) | ORAL | Status: DC

## 2015-01-20 NOTE — ED Notes (Signed)
Family at bedside.pt.was given a ice pack

## 2015-01-20 NOTE — Discharge Instructions (Signed)
Wrist Pain Wrist injuries are frequent in adults and children. A sprain is an injury to the ligaments that hold your bones together. A strain is an injury to muscle or muscle cord-like structures (tendons) from stretching or pulling. Generally, when wrists are moderately tender to touch following a fall or injury, a break in the bone (fracture) may be present. Most wrist sprains or strains are better in 3 to 5 days, but complete healing may take several weeks. HOME CARE INSTRUCTIONS   Put ice on the injured area.  Put ice in a plastic bag.  Place a towel between your skin and the bag.  Leave the ice on for 15-20 minutes, 3-4 times a day, for the first 2 days, or as directed by your health care provider.  Keep your arm raised above the level of your heart whenever possible to reduce swelling and pain.  Rest the injured area for at least 48 hours or as directed by your health care provider.  If a splint or elastic bandage has been applied, use it for as long as directed by your health care provider or until seen by a health care provider for a follow-up exam.  Only take over-the-counter or prescription medicines for pain, discomfort, or fever as directed by your health care provider.  Keep all follow-up appointments. You may need to follow up with a specialist or have follow-up X-rays. Improvement in pain level is not a guarantee that you did not fracture a bone in your wrist. The only way to determine whether or not you have a broken bone is by X-ray. SEEK IMMEDIATE MEDICAL CARE IF:   Your fingers are swollen, very red, white, or cold and blue.  Your fingers are numb or tingling.  You have increasing pain.  You have difficulty moving your fingers. MAKE SURE YOU:   Understand these instructions.  Will watch your condition.  Will get help right away if you are not doing well or get worse. Document Released: 06/06/2005 Document Revised: 09/01/2013 Document Reviewed:  10/18/2010 [facility] Patient Information 2015 [place], [place]. This information is not intended to replace advice given to you by your health care provider. Make sure you discuss any questions you have with your health care provider. Wrist Splint A wrist splint is a brace that holds your wrist in a fixed position. It can be used to stabilize your wrist so that broken bones and sprains can heal faster, with less pain. It can also help to relieve pressure on the nerve that runs down the middle of your arm (median nerve). Splints are available in drugstores without a prescription. They are also available by prescription from orthopedic and medical supply stores. Custom splints made from lightweight materials can be made by physical or occupational therapist. [place] your splint as instructed by your caregiver. It may be worn while you sleep.  Your caregiver may instruct you how to perform certain exercises at home. These exercises help maintain muscle strength in your hand and wrist. They also help to maintain motion in your fingers. SEEK MEDICAL CARE IF:  You start to lose feeling in your hand or fingers.  Your skin or fingernails turn blue or gray, or they feel cold. MAKE SURE YOU:   Understand these instructions.  Will watch your condition.  Will get help right away if you are not doing well or get worse. Document Released: 08/09/2006 Document Revised: 11/19/2011 Document Reviewed: 12/08/2013 [facility] Patient Information 2015 [place], [place]. This information is  not intended to replace advice given to you by your health care provider. Make sure you discuss any questions you have with your health care provider. Chronic Back Pain  When back pain lasts longer than 3 months, it is called chronic back pain.People with chronic back pain often go through certain periods that are more intense (flare-ups).  CAUSES Chronic back pain can be caused by wear and tear (degeneration)  on different structures in your back. These structures include:  The bones of your spine (vertebrae) and the joints surrounding your spinal cord and nerve roots (facets).  The strong, fibrous tissues that connect your vertebrae (ligaments). Degeneration of these structures may result in pressure on your nerves. This can lead to constant pain. HOME CARE INSTRUCTIONS  Avoid bending, heavy lifting, prolonged sitting, and activities which make the problem worse.  Take brief periods of rest throughout the day to reduce your pain. Lying down or standing usually is better than sitting while you are resting.  Take over-the-counter or prescription medicines only as directed by your caregiver. SEEK IMMEDIATE MEDICAL CARE IF:   You have weakness or numbness in one of your legs or feet.  You have trouble controlling your bladder or bowels.  You have nausea, vomiting, abdominal pain, shortness of breath, or fainting. Document Released: 10/04/2004 Document Revised: 11/19/2011 Document Reviewed: 08/11/2011 [facility] Patient Information 2015 [employer] [place], [place]. This information is not intended to replace advice given to you by your health care provider. Make sure you discuss any questions you have with your health care provider.

## 2015-01-20 NOTE — ED Provider Notes (Signed)
CSN: [id number]     Arrival date & time 01/20/15  0803 History   First MD Initiated Contact with Patient 01/20/15 0809     Chief Complaint  Patient presents with  . Back Pain  . Hand Pain  . Wrist Pain   Amanda Bennett is a 59 y.o. female with a history of cervical spinal stenosis, chronic pain syndrome, osteoporosis, and chronic low back pain who presents the emergency department complaining of being out of her chronic low back pain medications as well as left wrist pain after a fall 2 days ago. The patient reports that her legs occasionally give way for the past many years. She reports that 2 days ago she was walking and her leg gave away and she caught herself using her left hand. She denies completely falling. She denies having her head or loss of consciousness. She is complaining of left wrist and hand pain that is worse with movement. Patient is also complaining of her chronic bilateral low back pain that is been ongoing for many years but she reports she has seen orthopedic and neurosurgery who have referred her to pain management. She reports having an appointment with pain management in 6 days. She rates her pain at 10 out of 10 and worse with movement. She reports taking Motrin this morning with little relief. The patient denies fevers, chills, new numbness, new tingling, weakness, loss of bladder control, loss of bowel control, difficulty urinating, urinary symptoms, vomiting, abdominal pain, headaches, or lightheadedness. This patient was looked up on the [place] controlled substance database which indicated she received 60 tablets of Norco on 01/13/2015.   (Consider location/radiation/quality/duration/timing/severity/associated sxs/prior Treatment) HPI  Past Medical History  Diagnosis Date  . Foot fracture, right 07/2010    per x-ray 07/2010 - oblique comminuted fifth metatarsal fracture, followed by Dr. Lorelei Pont  . Depression   . Insomnia   . GERD (gastroesophageal reflux  disease)   . Carpal tunnel syndrome   . Cervical spinal stenosis 2008    per x-ray findings 2008  . Pulmonary nodule, right     RUL nodule, 7 mm noted on Chest XRAY - 06/2008, per CT follow up in 2009 - No suspicious pulmonary nodules or masse noted  . Cervicalgia   . Pain in limb   . Lumbago   . Chronic pain syndrome   . Cervical syndrome   . Primary localized osteoarthrosis, pelvic region and thigh   . Lesion of ulnar nerve   . Anxiety   . Osteoporosis   . Unexplained weight loss   . Visual disturbance   . Cough   . Arthritis pain   . Bruises easily   . Wound disruption, post-op, skin   . PONV (postoperative nausea and vomiting)     shortness of breath   . Hepatitis     hx of Hep C   . Wears glasses   . Dysrhythmia     prolonger QT   Past Surgical History  Procedure Laterality Date  . Abdominal hysterectomy    . Finger amputated      right ring  . Drain in left ring finger      cat bite  . Broken clavical    . Cholecystectomy    . Hemorrhoid surgery  11/25/2006  . Cholecystectomy  10/30/2011    Procedure: LAPAROSCOPIC CHOLECYSTECTOMY WITH INTRAOPERATIVE CHOLANGIOGRAM;  Surgeon: Imogene Burn. Georgette Dover, MD;  Location: [facility];  Service: General;  Laterality: N/A;  . Carpal tunnel release  right and left  . Colonoscopy    . Inguinal lymph node biopsy Right 02/03/2014    Procedure: INGUINAL LYMPH NODE BIOPSY;  Surgeon: Zenovia Jarred, MD;  Location: [facility];  Service: General;  Laterality: Right;  right inguinal area   Family History  Problem Relation Age of Onset  . Stroke Neg Hx   . Stomach cancer Neg Hx   . Cancer Father     colon/prostate  . Colon cancer Father 6    deceased at age 45 from colon cancer  . Cancer Paternal Grandmother     ovarian   History  Substance Use Topics  . Smoking status: Former Smoker -- 0.50 packs/day for 20 years    Types: Cigarettes    Quit date: 10/24/2014  . Smokeless tobacco: Never Used     Comment: pack  lasts about a week./ DOWN TO 5 CIAGARETTE  . Alcohol Use: No   OB History    No data available     Review of Systems  Constitutional: Negative for fever and chills.  HENT: Negative for congestion and sore throat.   Eyes: Negative for visual disturbance.  Respiratory: Negative for cough and shortness of breath.   Cardiovascular: Negative for chest pain.  Gastrointestinal: Negative for vomiting, abdominal pain and diarrhea.  Genitourinary: Negative for dysuria, urgency, frequency, flank pain and difficulty urinating.  Musculoskeletal: Positive for back pain. Negative for neck pain.       Left wrist and hand pain.   Skin: Negative for rash.  Neurological: Negative for dizziness, syncope, weakness, light-headedness and headaches.      Allergies  Morphine and related; Hydrocodone; Prozac; and Tramadol  Home Medications   Prior to Admission medications   Medication Sig Start Date End Date Taking? Authorizing Provider  albuterol (PROVENTIL HFA;VENTOLIN HFA) 108 (90 BASE) MCG/ACT inhaler Inhale 1-2 puffs into the lungs every 6 (six) hours as needed for wheezing or shortness of breath.   Yes Historical Provider, MD  amLODipine (NORVASC) 5 MG tablet Take 1 tablet (5 mg total) by mouth daily. 08/24/14  Yes Tasrif Ahmed, MD  Calcium Carbonate-Vitamin D (CALCIUM 600/VITAMIN D) 600-400 MG-UNIT per tablet Take 2 tablets by mouth daily. 10/08/14  Yes Lucious Groves, DO  diphenhydrAMINE (BENADRYL) 25 MG tablet Take 25 mg by mouth every 6 (six) hours as needed for sleep.   Yes Historical Provider, MD  ibuprofen (ADVIL,MOTRIN) 200 MG tablet Take 200 mg by mouth every 6 (six) hours as needed for mild pain or moderate pain.   Yes Historical Provider, MD  naproxen sodium (ANAPROX) 220 MG tablet Take 220 mg by mouth daily as needed (pain).   Yes Historical Provider, MD  omeprazole (PRILOSEC) 20 MG capsule Take 20 mg by mouth daily.   Yes Historical Provider, MD  cyclobenzaprine (FLEXERIL) 10 MG tablet  Take 1 tablet (10 mg total) by mouth 3 (three) times daily as needed for muscle spasms. Patient not taking: Reported on 01/20/2015 12/20/14   Dellia Nims, MD  naproxen (NAPROSYN) 250 MG tablet Take 1 tablet (250 mg total) by mouth 2 (two) times daily with a meal. 01/20/15   Waynetta Pean, PA-C  oxyCODONE (ROXICODONE) 5 MG immediate release tablet Take 1.5 tablets (7.5 mg total) by mouth every 4 (four) hours as needed for severe pain. Patient not taking: Reported on 01/20/2015 12/20/14   Dellia Nims, MD  zolpidem (AMBIEN) 5 MG tablet Take 1 tablet (5 mg total) by mouth at bedtime as needed for sleep.  Patient not taking: Reported on 01/20/2015 10/15/14   Dellia Nims, MD   BP 133/72 mmHg  Pulse 98  Temp(Src) 97.9 F (36.6 C) (Oral)  Resp 18  Ht 5\' 4"  (1.626 m)  Wt 130 lb (58.968 kg)  BMI 22.30 kg/m2  SpO2 98%  LMP 10/23/2002 Physical Exam  Constitutional: She appears well-developed and well-nourished. No distress.  Nontoxic appearing.  HENT:  Head: Normocephalic and atraumatic.  Mouth/Throat: Oropharynx is clear and moist. No oropharyngeal exudate.  Eyes: Conjunctivae are normal. Pupils are equal, round, and reactive to light. Right eye exhibits no discharge. Left eye exhibits no discharge.  Neck: Neck supple.  Cardiovascular: Normal rate, regular rhythm, normal heart sounds and intact distal pulses.  Exam reveals no gallop and no friction rub.   No murmur heard. Bilateral radial, posterior tibialis and dorsalis pedis pulses are intact.    Pulmonary/Chest: Effort normal and breath sounds normal. No respiratory distress. She has no wheezes. She has no rales.  Abdominal: Soft. There is no tenderness.  Musculoskeletal: She exhibits no edema.  Patient has bilateral low back tenderness without midline back tenderness. No back edema, erythema or deformity noted. Patient has 5 out of 5 strength to her bilateral lower extremities. She is able to ambulate in the room without difficulty or assistance.  She is able to hold her legs up to move the foot rest on the stretcher chair. Patient has mild tenderness over the dorsal aspect of her left wrist. Mild amount of edema to left wrist. She has good passive ROM of her left wrist. There is no snuffbox tenderness.  Lymphadenopathy:    She has no cervical adenopathy.  Neurological: She is alert. She has normal reflexes. She displays normal reflexes. Coordination normal.  Bilateral patellar DTRs are intact. Sensation is intact in her bilateral lower extremities.  Skin: Skin is warm and dry. No rash noted. She is not diaphoretic. No erythema. No pallor.  Psychiatric: She has a normal mood and affect. Her behavior is normal.  Nursing note and vitals reviewed.   ED Course  Procedures (including critical care time) Labs Review Labs Reviewed - No data to display  Imaging Review Dg Wrist Complete Left  01/20/2015   CLINICAL DATA:  59 year old female fell 2 days ago with pain across wrist. Initial encounter.  EXAM: LEFT HAND - COMPLETE 3+ VIEW; LEFT WRIST - COMPLETE 3+ VIEW  COMPARISON:  None.  FINDINGS: Left wrist four views:  No fracture or dislocation.  Mild soft tissue prominence dorsal wrist possibly normal versus related to soft tissue injury.  No scaphoid fracture detected. If there were persistent scaphoid region tenderness, then followup plain film examination in 7-10 days or MR could be obtained to exclude occult scaphoid injury.  Left hand three views:  No fracture or dislocation.  IMPRESSION: No fracture or dislocation noted.  Please see above discussion.   Electronically Signed   By: Genia Del M.D.   On: 01/20/2015 09:35   Dg Hand Complete Left  01/20/2015   CLINICAL DATA:  59 year old female fell 2 days ago with pain across wrist. Initial encounter.  EXAM: LEFT HAND - COMPLETE 3+ VIEW; LEFT WRIST - COMPLETE 3+ VIEW  COMPARISON:  None.  FINDINGS: Left wrist four views:  No fracture or dislocation.  Mild soft tissue prominence dorsal wrist  possibly normal versus related to soft tissue injury.  No scaphoid fracture detected. If there were persistent scaphoid region tenderness, then followup plain film examination in 7-10 days or MR could  be obtained to exclude occult scaphoid injury.  Left hand three views:  No fracture or dislocation.  IMPRESSION: No fracture or dislocation noted.  Please see above discussion.   Electronically Signed   By: Genia Del M.D.   On: 01/20/2015 09:35     EKG Interpretation None      Filed Vitals:   01/20/15 0811 01/20/15 0947  BP: 125/95 133/72  Pulse: 85 98  Temp: 98.5 F (36.9 C) 97.9 F (36.6 C)  TempSrc: Oral Oral  Resp: 18 18  Height: 5\' 4"  (1.626 m)   Weight: 130 lb (58.968 kg)   SpO2: 100% 98%     MDM   Meds given in ED:  Medications - No data to display  Discharge Medication List as of 01/20/2015  9:49 AM    START taking these medications   Details  naproxen (NAPROSYN) 250 MG tablet Take 1 tablet (250 mg total) by mouth 2 (two) times daily with a meal., Starting 01/20/2015, Until Discontinued, Print        Final diagnoses:  Left wrist sprain, initial encounter  Chronic lower back pain   This is a 59 y.o. female with a history of cervical spinal stenosis, chronic pain syndrome, osteoporosis, and chronic low back pain who presents the emergency department complaining of being out of her chronic low back pain medications as well as left wrist pain after a fall 2 days ago. She reports her leg gave way several days ago and she caught herself with her left hand. She is complaining of left wrist pain. Complaining of worsening chronic low back pain due to being out of her narcotic pain medicine. On exam patient is afebrile and nontoxic appearing. She has some mild tenderness over her left wrist. No snuffbox tenderness. She has no neurological deficits and she is able to ambulate without difficulty or assistance. She denies any loss of bowel or bladder control or difficulty urinating.  No concern for cauda equina. Patient has an appointment for follow-up with pain management and 6 days. X-rays were obtained of her left hand and wrist. I rechecked the patient and she does not have any snuffbox tenderness. Patient does request a wrist brace. This was applied in ED. Patient provided with a prescription for Naprosyn and advised to follow-up with pain management for her chronic pain. I advised the patient to follow-up with their primary care provider this week. I advised the patient to return to the emergency department with new or worsening symptoms or new concerns. The patient verbalized understanding and agreement with plan.       Waynetta Pean, PA-C 01/20/15 [place], MD 01/21/15 [phone]

## 2015-01-20 NOTE — ED Notes (Signed)
Patient states chronic back problems that she has been referred to pain clinic by neurosurgery for.   Patient states due to same, her R leg gives out from under her at times.  Patient states fell last night and she hurt her L hand/wrist.   Patient states hasn't slept in 4 days.

## 2015-04-02 ENCOUNTER — Emergency Department
Admission: EM | Admit: 2015-04-02 | Discharge: 2015-04-02 | Disposition: A | Discharge status: Home / Self Care | Payer: Medicare Other | Attending: Emergency Medicine | Admitting: Emergency Medicine

## 2015-04-02 ENCOUNTER — Emergency Department: Payer: Medicare Other

## 2015-04-02 DIAGNOSIS — Y998 Other external cause status: Secondary | ICD-10-CM | POA: Insufficient documentation

## 2015-04-02 DIAGNOSIS — Z87891 Personal history of nicotine dependence: Secondary | ICD-10-CM | POA: Insufficient documentation

## 2015-04-02 DIAGNOSIS — S299XXA Unspecified injury of thorax, initial encounter: Secondary | ICD-10-CM | POA: Diagnosis present

## 2015-04-02 DIAGNOSIS — Y9289 Other specified places as the place of occurrence of the external cause: Secondary | ICD-10-CM | POA: Diagnosis not present

## 2015-04-02 DIAGNOSIS — W1839XA Other fall on same level, initial encounter: Secondary | ICD-10-CM | POA: Insufficient documentation

## 2015-04-02 DIAGNOSIS — Z79899 Other long term (current) drug therapy: Secondary | ICD-10-CM | POA: Insufficient documentation

## 2015-04-02 DIAGNOSIS — S20211A Contusion of right front wall of thorax, initial encounter: Secondary | ICD-10-CM | POA: Diagnosis not present

## 2015-04-02 DIAGNOSIS — I10 Essential (primary) hypertension: Secondary | ICD-10-CM | POA: Diagnosis not present

## 2015-04-02 DIAGNOSIS — Y9389 Activity, other specified: Secondary | ICD-10-CM | POA: Diagnosis not present

## 2015-04-02 MED ORDER — OXYCODONE-ACETAMINOPHEN 5-325 MG PO TABS: 1.0000 | ORAL_TABLET | ORAL | Status: DC | PRN

## 2015-04-02 NOTE — ED Provider Notes (Signed)
[facility] Emergency Department Provider Note  ____________________________________________  Time seen:  11:44 AM  I have reviewed the triage vital signs and the nursing notes.   HISTORY  Chief Complaint Rib Injury   HPI Amanda Bennett is a 59 y.o. female is here with complaint of right chest pain. She states that Thursday night she was getting out of the tub and fell directly onto her ribs.She has continued to have pain with movement and deep inspirations since that time. She took some tramadol without any improvement. 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 Pain is localized to the right lateral aspect. Taking deep breaths and coughing increases her pain and nothing seems to relieve it. She denies any head injury or loss of consciousness. Currently her pain is 10 out of 10.   Past Medical History  Diagnosis Date  . Foot fracture, right 07/2010    per x-ray 07/2010 - oblique comminuted fifth metatarsal fracture, followed by Dr. Lorelei Pont  . Depression   . Insomnia   . GERD (gastroesophageal reflux disease)   . Carpal tunnel syndrome   . Cervical spinal stenosis 2008    per x-ray findings 2008  . Pulmonary nodule, right     RUL nodule, 7 mm noted on Chest XRAY - 06/2008, per CT follow up in 2009 - No suspicious pulmonary nodules or masse noted  . Cervicalgia   . Pain in limb   . Lumbago   . Chronic pain syndrome   . Cervical syndrome   . Primary localized osteoarthrosis, pelvic region and thigh   . Lesion of ulnar nerve   . Anxiety   . Osteoporosis   . Unexplained weight loss   . Visual disturbance   . Cough   . Arthritis pain   . Bruises easily   . Wound disruption, post-op, skin   . PONV (postoperative nausea and vomiting)     shortness of breath   . Hepatitis     hx of Hep C   . Wears glasses   . Dysrhythmia     prolonger QT    Patient Active Problem List   Diagnosis Date Noted  . Palpitations 10/01/2014  . Tobacco abuse 07/23/2014  . Mouth lesion 07/23/2014  . Sore throat 07/23/2014  . Postoperative wound infection 02/13/2014  . Inguinal lymphadenopathy 12/30/2013  . Family history of long QT syndrome 09/18/2013  . Chest pain on exertion 09/18/2013  . Rash and nonspecific skin eruption 09/18/2013  . Leg pain 09/18/2013  . Right groin pain 06/18/2013  . Preventative health care 06/18/2013  . CAFL (chronic airflow limitation) 04/10/2013  . BP (high blood pressure) 04/10/2013  . Closed traumatic PIP dislocation 04/07/2013  . Abnormality of gait 12/16/2012  . Asthma 08/21/2012  . Myofascial pain dysfunction syndrome 06/25/2012  . Bursitis of hip, right 04/09/2012  . Hypertension 04/09/2012  . Headache(784.0) 04/09/2012  . Hyperlipidemia 04/09/2012  . Low back pain 12/26/2011  . Lumbar spondylosis 12/26/2011  . Localized primary carpometacarpal osteoarthritis 11/07/2011  . Chronic neck pain 11/07/2011  . Chronic calculus cholecystitis 10/03/2011  . Skin lesion of right arm 10/02/2011  . Osteoporosis 10/02/2011  . Gallstone 10/02/2011  . Hot flash, menopausal 01/17/2011  . History of hepatitis C 06/15/2010  . CARPAL TUNNEL SYNDROME, BILATERAL 12/06/2009  . GERD 06/22/2009  . Depression with anxiety 05/25/2009  . [place] DISEASE, CERVICAL 05/25/2009  . Insomnia 05/25/2009  Past Surgical History  Procedure Laterality Date  . Abdominal hysterectomy    . Finger amputated      right ring  . Drain in left ring finger      cat bite  . Broken clavical    . Cholecystectomy    . Hemorrhoid surgery  11/25/2006  . Cholecystectomy  10/30/2011    Procedure: LAPAROSCOPIC CHOLECYSTECTOMY WITH INTRAOPERATIVE CHOLANGIOGRAM;  Surgeon: Imogene Burn. Georgette Dover, MD;  Location: [facility];  Service: General;  Laterality: N/A;  . Carpal tunnel release      right and left  . Colonoscopy    . Inguinal lymph node biopsy Right 02/03/2014    Procedure: INGUINAL  LYMPH NODE BIOPSY;  Surgeon: Zenovia Jarred, MD;  Location: [facility];  Service: General;  Laterality: Right;  right inguinal area    Current Outpatient Rx  Name  Route  Sig  Dispense  Refill  . albuterol (PROVENTIL HFA;VENTOLIN HFA) 108 (90 BASE) MCG/ACT inhaler   Inhalation   Inhale 1-2 puffs into the lungs every 6 (six) hours as needed for wheezing or shortness of breath.         Marland Kitchen amLODipine (NORVASC) 5 MG tablet   Oral   Take 1 tablet (5 mg total) by mouth daily.   90 tablet   1   . Calcium Carbonate-Vitamin D (CALCIUM 600/VITAMIN D) 600-400 MG-UNIT per tablet   Oral   Take 2 tablets by mouth daily.         . cyclobenzaprine (FLEXERIL) 10 MG tablet   Oral   Take 1 tablet (10 mg total) by mouth 3 (three) times daily as needed for muscle spasms. Patient not taking: Reported on 01/20/2015   30 tablet   3   . diphenhydrAMINE (BENADRYL) 25 MG tablet   Oral   Take 25 mg by mouth every 6 (six) hours as needed for sleep.         Marland Kitchen ibuprofen (ADVIL,MOTRIN) 200 MG tablet   Oral   Take 200 mg by mouth every 6 (six) hours as needed for mild pain or moderate pain.         . naproxen (NAPROSYN) 250 MG tablet   Oral   Take 1 tablet (250 mg total) by mouth 2 (two) times daily with a meal.   30 tablet   0   . naproxen sodium (ANAPROX) 220 MG tablet   Oral   Take 220 mg by mouth daily as needed (pain).         Marland Kitchen omeprazole (PRILOSEC) 20 MG capsule   Oral   Take 20 mg by mouth daily.         Marland Kitchen oxyCODONE (ROXICODONE) 5 MG immediate release tablet   Oral   Take 1.5 tablets (7.5 mg total) by mouth every 4 (four) hours as needed for severe pain. Patient not taking: Reported on 01/20/2015   90 tablet   0   . oxyCODONE-acetaminophen (PERCOCET) 5-325 MG per tablet   Oral   Take 1 tablet by mouth every 4 (four) hours as needed for severe pain.   20 tablet   0   . zolpidem (AMBIEN) 5 MG tablet   Oral   Take 1 tablet (5 mg total) by mouth at bedtime  as needed for sleep. Patient not taking: Reported on 01/20/2015   30 tablet   1     Allergies Morphine and related; Hydrocodone; Prozac; and Tramadol  Family History  Problem Relation Age of Onset  .  Stroke Neg Hx   . Stomach cancer Neg Hx   . Cancer Father     colon/prostate  . Colon cancer Father 84    deceased at age 51 from colon cancer  . Cancer Paternal Grandmother     ovarian    Social History History  Substance Use Topics  . Smoking status: Former Smoker -- 0.50 packs/day for 20 years    Types: Cigarettes    Quit date: 10/24/2014  . Smokeless tobacco: Never Used     Comment: pack lasts about a week./ DOWN TO 5 CIAGARETTE  . Alcohol Use: No    Review of Systems Constitutional: No fever/chills Eyes: No visual changes. ENT: No sore throat. Cardiovascular: Positive right chest pain. Respiratory: shortness of breath with deep inspiration. Gastrointestinal: No abdominal pain.  No nausea, no vomiting.  Genitourinary: Negative for dysuria. Musculoskeletal: Negative for back pain. Skin: Negative for rash. Neurological: Negative for headaches, focal weakness or numbness.  10-point ROS otherwise negative.  ____________________________________________   PHYSICAL EXAM:  VITAL SIGNS: ED Triage Vitals  Enc Vitals Group     BP 04/02/15 1058 149/121 mmHg     Pulse Rate 04/02/15 1058 77     Resp --      Temp 04/02/15 1058 98.2 F (36.8 C)     Temp src --      SpO2 04/02/15 1058 100 %     Weight 04/02/15 1058 120 lb (54.432 kg)     Height 04/02/15 1058 5\' 4"  (1.626 m)     Head Cir --      Peak Flow --      Pain Score 04/02/15 1059 10     Pain Loc --      Pain Edu? --      Excl. in [place]? --     Constitutional: Alert and oriented. Well appearing and in no acute distress. Eyes: Conjunctivae are normal. PERRL. EOMI. Head: Atraumatic. Nose: No congestion/rhinnorhea. Neck: No stridor. Nontender cervical spine on palpation Cardiovascular: Normal rate, regular  rhythm. Grossly normal heart sounds.  Good peripheral circulation. Respiratory: Normal respiratory effort.  No retractions. Lungs CTAB. Gastrointestinal: Soft and nontender. No distention.  No CVA tenderness. Musculoskeletal: Moderate tenderness on palpation of the right lateral ribs approximately 6, 7, and 8. No gross deformity was noted, no edema. Range of motion of the upper extremity increases pain. No lower extremity tenderness nor edema.  No joint effusions. Neurologic:  Normal speech and language. No gross focal neurologic deficits are appreciated. No gait instability. Skin:  Skin is warm, dry and intact. No rash noted. No ecchymosis or abrasions noted in the right lateral chest area. Psychiatric: Mood and affect are normal. Speech and behavior are normal.  ____________________________________________   LABS (all labs ordered are listed, but only abnormal results are displayed)  Labs Reviewed - No data to display ____________________________________________  RADIOLOGY  Chest x-ray per radiologist no acute abnormality was seen. Remote right fourth through sixth rib fractures healing. I, Johnn Hai, personally viewed and evaluated these images as part of my medical decision making.  ____________________________________________   PROCEDURES  Procedure(s) performed: None  Critical Care performed: No  ____________________________________________   INITIAL IMPRESSION / ASSESSMENT AND PLAN / ED COURSE  Pertinent labs & imaging results that were available during my care of the patient were reviewed by me and considered in my medical decision making (see chart for details).  Patient is given a prescription for Percocet as needed for pain. She is to  use ice to the area if needed also encouraged to decrease smoking. She may also use a pillow against her chest for deep inspiration on hourly basis. ____________________________________________   FINAL CLINICAL IMPRESSION(S) /  ED DIAGNOSES  Final diagnoses:  Rib contusion, right, initial encounter      Johnn Hai, PA-C 04/02/15 [place], MD 04/02/15 [phone]
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
## 2015-04-02 NOTE — ED Notes (Signed)
AAOx3.  Skin warm and dry.  NAD.  D/C home, ambulates with easy, steady gait.

## 2015-04-02 NOTE — ED Notes (Signed)
Fall onto right rib Thursday night while getting out of bathtub. Pain with movement and breathing. Pt took tramadol PTA.

## 2015-04-02 NOTE — ED Notes (Signed)
[place] [address] yesterday getting out of bathtub - states believes she broke a rib

## 2015-06-23 ENCOUNTER — Other Ambulatory Visit: Payer: Self-pay | Admitting: Surgery

## 2015-06-23 DIAGNOSIS — M7582 Other shoulder lesions, left shoulder: Secondary | ICD-10-CM

## 2015-07-01 ENCOUNTER — Ambulatory Visit
Admission: RE | Admit: 2015-07-01 | Discharge: 2015-07-01 | Disposition: A | Discharge status: Home / Self Care | Payer: Medicare HMO | Source: Ambulatory Visit | Attending: Surgery | Admitting: Surgery

## 2015-07-01 DIAGNOSIS — M67912 Unspecified disorder of synovium and tendon, left shoulder: Secondary | ICD-10-CM | POA: Diagnosis present

## 2015-07-01 DIAGNOSIS — M67814 Other specified disorders of tendon, left shoulder: Secondary | ICD-10-CM | POA: Insufficient documentation

## 2015-07-01 DIAGNOSIS — M19012 Primary osteoarthritis, left shoulder: Secondary | ICD-10-CM | POA: Insufficient documentation

## 2015-07-01 DIAGNOSIS — M7582 Other shoulder lesions, left shoulder: Secondary | ICD-10-CM

## 2015-07-13 ENCOUNTER — Other Ambulatory Visit: Payer: Self-pay | Admitting: *Deleted

## 2015-07-13 ENCOUNTER — Encounter: Payer: Self-pay | Admitting: *Deleted

## 2015-07-13 NOTE — Telephone Encounter (Signed)
Pt no longer with [facility]

## 2015-07-20 ENCOUNTER — Ambulatory Visit
Admission: RE | Admit: 2015-07-20 | Discharge: 2015-07-20 | Disposition: A | Discharge status: Home / Self Care | Payer: Medicare HMO | Source: Ambulatory Visit | Attending: Surgery | Admitting: Surgery

## 2015-07-20 ENCOUNTER — Ambulatory Visit: Payer: Medicare HMO | Admitting: Anesthesiology

## 2015-07-20 ENCOUNTER — Encounter
Admission: RE | Disposition: A | Discharge status: Home / Self Care | Payer: Self-pay | Source: Ambulatory Visit | Attending: Surgery

## 2015-07-20 DIAGNOSIS — Z791 Long term (current) use of non-steroidal anti-inflammatories (NSAID): Secondary | ICD-10-CM | POA: Insufficient documentation

## 2015-07-20 DIAGNOSIS — G629 Polyneuropathy, unspecified: Secondary | ICD-10-CM | POA: Insufficient documentation

## 2015-07-20 DIAGNOSIS — Z8042 Family history of malignant neoplasm of prostate: Secondary | ICD-10-CM | POA: Insufficient documentation

## 2015-07-20 DIAGNOSIS — M7542 Impingement syndrome of left shoulder: Secondary | ICD-10-CM | POA: Insufficient documentation

## 2015-07-20 DIAGNOSIS — F419 Anxiety disorder, unspecified: Secondary | ICD-10-CM | POA: Insufficient documentation

## 2015-07-20 DIAGNOSIS — M549 Dorsalgia, unspecified: Secondary | ICD-10-CM | POA: Diagnosis not present

## 2015-07-20 DIAGNOSIS — K219 Gastro-esophageal reflux disease without esophagitis: Secondary | ICD-10-CM | POA: Diagnosis not present

## 2015-07-20 DIAGNOSIS — I10 Essential (primary) hypertension: Secondary | ICD-10-CM | POA: Insufficient documentation

## 2015-07-20 DIAGNOSIS — Z885 Allergy status to narcotic agent status: Secondary | ICD-10-CM | POA: Diagnosis not present

## 2015-07-20 DIAGNOSIS — Z888 Allergy status to other drugs, medicaments and biological substances status: Secondary | ICD-10-CM | POA: Insufficient documentation

## 2015-07-20 DIAGNOSIS — Z87891 Personal history of nicotine dependence: Secondary | ICD-10-CM | POA: Insufficient documentation

## 2015-07-20 DIAGNOSIS — M1990 Unspecified osteoarthritis, unspecified site: Secondary | ICD-10-CM | POA: Diagnosis not present

## 2015-07-20 DIAGNOSIS — Z79899 Other long term (current) drug therapy: Secondary | ICD-10-CM | POA: Insufficient documentation

## 2015-07-20 DIAGNOSIS — Z9889 Other specified postprocedural states: Secondary | ICD-10-CM | POA: Diagnosis not present

## 2015-07-20 DIAGNOSIS — G8929 Other chronic pain: Secondary | ICD-10-CM | POA: Insufficient documentation

## 2015-07-20 DIAGNOSIS — Z90710 Acquired absence of both cervix and uterus: Secondary | ICD-10-CM | POA: Diagnosis not present

## 2015-07-20 DIAGNOSIS — M810 Age-related osteoporosis without current pathological fracture: Secondary | ICD-10-CM | POA: Diagnosis not present

## 2015-07-20 DIAGNOSIS — Z9049 Acquired absence of other specified parts of digestive tract: Secondary | ICD-10-CM | POA: Insufficient documentation

## 2015-07-20 DIAGNOSIS — M7592 Shoulder lesion, unspecified, left shoulder: Secondary | ICD-10-CM | POA: Diagnosis present

## 2015-07-20 DIAGNOSIS — M654 Radial styloid tenosynovitis [de Quervain]: Secondary | ICD-10-CM | POA: Diagnosis not present

## 2015-07-20 HISTORY — PX: DORSAL COMPARTMENT RELEASE: SHX5039

## 2015-07-20 HISTORY — PX: SHOULDER ARTHROSCOPY: SHX128

## 2015-07-20 SURGERY — ARTHROSCOPY, SHOULDER
Anesthesia: Regional | Laterality: Left | Wound class: Clean

## 2015-07-20 MED ORDER — PROMETHAZINE HCL 25 MG/ML IJ SOLN
6.2500 mg | INTRAMUSCULAR | Status: AC | PRN
  Administered 2015-07-20 (×2): 6.25 mg via INTRAVENOUS

## 2015-07-20 MED ORDER — LACTATED RINGERS IV SOLN
INTRAVENOUS | Status: DC
  Administered 2015-07-20: 14:00:00 via INTRAVENOUS

## 2015-07-20 MED ORDER — HYDRALAZINE HCL 20 MG/ML IJ SOLN
10.0000 mg | Freq: Once | INTRAMUSCULAR | Status: AC
  Administered 2015-07-20: 10 mg via INTRAVENOUS

## 2015-07-20 MED ORDER — PROPOFOL 10 MG/ML IV BOLUS
INTRAVENOUS | Status: DC | PRN
  Administered 2015-07-20: 200 mg via INTRAVENOUS

## 2015-07-20 MED ORDER — ONDANSETRON HCL 4 MG/2ML IJ SOLN
INTRAMUSCULAR | Status: DC | PRN
  Administered 2015-07-20: 4 mg via INTRAVENOUS

## 2015-07-20 MED ORDER — LIDOCAINE HCL (CARDIAC) 20 MG/ML IV SOLN
INTRAVENOUS | Status: DC | PRN
  Administered 2015-07-20: 40 mg via INTRAVENOUS

## 2015-07-20 MED ORDER — METOCLOPRAMIDE HCL 5 MG/ML IJ SOLN
10.0000 mg | Freq: Once | INTRAMUSCULAR | Status: AC
  Administered 2015-07-20: 10 mg via INTRAVENOUS

## 2015-07-20 MED ORDER — DEXAMETHASONE SODIUM PHOSPHATE 4 MG/ML IJ SOLN
INTRAMUSCULAR | Status: DC | PRN
  Administered 2015-07-20: 4 mg via INTRAVENOUS
  Administered 2015-07-20: 4 mg via PERINEURAL

## 2015-07-20 MED ORDER — OXYCODONE HCL 5 MG PO TABS: 5.0000 mg | ORAL_TABLET | Freq: Once | ORAL | Status: DC | PRN

## 2015-07-20 MED ORDER — ROPIVACAINE HCL 5 MG/ML IJ SOLN
INTRAMUSCULAR | Status: DC | PRN
  Administered 2015-07-20: 200 mg via PERINEURAL

## 2015-07-20 MED ORDER — ONDANSETRON 4 MG PO TBDP: 4.0000 mg | ORAL_TABLET | Freq: Three times a day (TID) | ORAL | Status: DC | PRN

## 2015-07-20 MED ORDER — FENTANYL CITRATE (PF) 100 MCG/2ML IJ SOLN
INTRAMUSCULAR | Status: DC | PRN
  Administered 2015-07-20: 100 ug via INTRAVENOUS

## 2015-07-20 MED ORDER — MIDAZOLAM HCL 2 MG/2ML IJ SOLN
INTRAMUSCULAR | Status: DC | PRN
  Administered 2015-07-20: 2 mg via INTRAVENOUS

## 2015-07-20 MED ORDER — SCOPOLAMINE 1 MG/3DAYS TD PT72
1.0000 | MEDICATED_PATCH | TRANSDERMAL | Status: DC
  Administered 2015-07-20: 1.5 mg via TRANSDERMAL

## 2015-07-20 MED ORDER — IBUPROFEN 200 MG PO TABS: 600.0000 mg | ORAL_TABLET | Freq: Three times a day (TID) | ORAL | Status: DC

## 2015-07-20 MED ORDER — KETOROLAC TROMETHAMINE 30 MG/ML IJ SOLN
30.0000 mg | Freq: Once | INTRAMUSCULAR | Status: AC
  Administered 2015-07-20: 30 mg via INTRAVENOUS

## 2015-07-20 MED ORDER — EPHEDRINE SULFATE 50 MG/ML IJ SOLN
INTRAMUSCULAR | Status: DC | PRN
  Administered 2015-07-20 (×4): 5 mg via INTRAVENOUS

## 2015-07-20 MED ORDER — CEFAZOLIN SODIUM 1-5 GM-% IV SOLN
1.0000 g | Freq: Once | INTRAVENOUS | Status: AC
  Administered 2015-07-20: 1 g via INTRAVENOUS

## 2015-07-20 MED ORDER — BUPIVACAINE HCL (PF) 0.5 % IJ SOLN
INTRAMUSCULAR | Status: DC | PRN
  Administered 2015-07-20: 20 mL

## 2015-07-20 MED ORDER — OXYCODONE HCL 5 MG/5ML PO SOLN: 5.0000 mg | Freq: Once | ORAL | Status: DC | PRN

## 2015-07-20 MED ORDER — LABETALOL HCL 5 MG/ML IV SOLN: 10.0000 mg | Freq: Once | INTRAVENOUS | Status: DC

## 2015-07-20 MED ORDER — MEPERIDINE HCL 25 MG/ML IJ SOLN: 6.2500 mg | INTRAMUSCULAR | Status: DC | PRN

## 2015-07-20 MED ORDER — HYDROMORPHONE HCL 1 MG/ML IJ SOLN: 0.2500 mg | INTRAMUSCULAR | Status: DC | PRN

## 2015-07-20 MED ORDER — OXYCODONE HCL 5 MG PO TABS: 5.0000 mg | ORAL_TABLET | ORAL | Status: DC | PRN

## 2015-07-20 MED ORDER — EPINEPHRINE HCL 1 MG/ML IJ SOLN
INTRAMUSCULAR | Status: DC | PRN
  Administered 2015-07-20: 3000 mL

## 2015-07-20 SURGICAL SUPPLY — 50 items
BANDAGE ELASTIC 2 VELCRO NS LF (GAUZE/BANDAGES/DRESSINGS) IMPLANT
BANDAGE ELASTIC 3 VELCRO NS (GAUZE/BANDAGES/DRESSINGS) IMPLANT
BIT DRILL JUGRKNT W/NDL BIT2.9 (DRILL) IMPLANT
BLADE FULL RADIUS 3.5 (BLADE) ×2 IMPLANT
BLADE SHAVER 4.5X7 STR FR (MISCELLANEOUS) IMPLANT
BNDG COHESIVE 4X5 TAN STRL (GAUZE/BANDAGES/DRESSINGS) ×2 IMPLANT
BNDG ESMARK 4X12 TAN STRL LF (GAUZE/BANDAGES/DRESSINGS) ×2 IMPLANT
BUR ACROMIONIZER 4.0 (BURR) IMPLANT
BUR BR 5.5 WIDE MOUTH (BURR) IMPLANT
CHLORAPREP W/TINT 26ML (MISCELLANEOUS) ×4 IMPLANT
CORD BIP STRL DISP 12FT (MISCELLANEOUS) IMPLANT
COVER LIGHT HANDLE FLEXIBLE (MISCELLANEOUS) ×2 IMPLANT
COVER LIGHT HANDLE UNIVERSAL (MISCELLANEOUS) ×4 IMPLANT
COVER MAYO STAND STRL (DRAPES) ×2 IMPLANT
CUFF TOURNIQUET DUAL PORT 18X3 (MISCELLANEOUS) IMPLANT
DRAPE IMP U-DRAPE 54X76 (DRAPES) ×4 IMPLANT
DRILL JUGGERKNOT W/NDL BIT 2.9 (DRILL)
GAUZE PETRO XEROFOAM 1X8 (MISCELLANEOUS) ×2 IMPLANT
GAUZE SPONGE 4X4 12PLY STRL (GAUZE/BANDAGES/DRESSINGS) ×2 IMPLANT
GAUZE STRETCH 2X75IN STRL (MISCELLANEOUS) ×2 IMPLANT
GLOVE BIO SURGEON STRL SZ8 (GLOVE) ×4 IMPLANT
GLOVE INDICATOR 8.0 STRL GRN (GLOVE) ×2 IMPLANT
GOWN STRL REUS W/ TWL LRG LVL3 (GOWN DISPOSABLE) ×1 IMPLANT
GOWN STRL REUS W/ TWL XL LVL3 (GOWN DISPOSABLE) ×1 IMPLANT
GOWN STRL REUS W/TWL LRG LVL3 (GOWN DISPOSABLE) ×1
GOWN STRL REUS W/TWL XL LVL3 (GOWN DISPOSABLE) ×1
IV LACTATED RINGER IRRG 3000ML (IV SOLUTION) ×2
IV LR IRRIG 3000ML ARTHROMATIC (IV SOLUTION) ×2 IMPLANT
KIT CANNULA 8X76-LX IN CANNULA (CANNULA) ×2 IMPLANT
MANIFOLD 4PT FOR NEPTUNE1 (MISCELLANEOUS) ×2 IMPLANT
MAT BLUE FLOOR 46X72 FLO (MISCELLANEOUS) ×2 IMPLANT
NDL MAYO CATGUT SZ5 (NEEDLE)
NDL SUT 5 .5 CRC TPR PNT MAYO (NEEDLE) IMPLANT
NEEDLE HYPO 21X1.5 SAFETY (NEEDLE) ×2 IMPLANT
NS IRRIG 500ML POUR BTL (IV SOLUTION) ×2 IMPLANT
PACK ARTHROSCOPY SHOULDER (MISCELLANEOUS) ×2 IMPLANT
PACK EXTREMITY ARMC (MISCELLANEOUS) IMPLANT
PAD GROUND ADULT SPLIT (MISCELLANEOUS) ×2 IMPLANT
STAPLER SKIN PROX 35W (STAPLE) ×2 IMPLANT
STRAP BODY AND KNEE 60X3 (MISCELLANEOUS) ×4 IMPLANT
SUT ETHIBOND 0 MO6 C/R (SUTURE) IMPLANT
SUT PROLENE 4 0 PS 2 18 (SUTURE) ×2 IMPLANT
SUT VIC AB 2-0 CT1 27 (SUTURE)
SUT VIC AB 2-0 CT1 TAPERPNT 27 (SUTURE) IMPLANT
SUT VIC AB 3-0 SH 27 (SUTURE)
SUT VIC AB 3-0 SH 27X BRD (SUTURE) IMPLANT
SYR 30ML LL (SYRINGE) IMPLANT
TAPE MICROFOAM 4IN (TAPE) ×2 IMPLANT
TUBING ARTHRO INFLOW-ONLY STRL (TUBING) ×2 IMPLANT
WAND HAND CNTRL MULTIVAC 90 (MISCELLANEOUS) ×2 IMPLANT

## 2015-07-20 NOTE — Op Note (Signed)
07/20/2015  5:02 PM  Patient:   Amanda Bennett  Pre-Op Diagnosis:   1. Impingement/tendinopathy, left shoulder.  2. DeQuervain's tenosynovitis, left wrist.   Postoperative diagnosis: 1. Impingement/tendinopathy with labral fraying, left shoulder.  2. DeQuervain's tenosynovitis, left wrist.  Procedure: 1. Arthroscopic labral debridement and arthroscopic subacromial decompression, left shoulder.  2. Release of first dorsal compartment left wrist.  Anesthesia: General laryngo-mask with interscalene block placed preoperatively by the anesthesiologist.  Surgeon:   Pascal Lux, MD  Assistant:   None  Findings: As above. There was a small stable longitudinal tear involving the superior insertional fibers of the subscapularis tendon. The remainder of the rotator cuff was in excellent condition. There was fraying of the labrum anteriorly and postero-superiorly without detachment from the glenoid. The biceps tendon itself was in excellent condition, as were the articular surfaces of the glenoid and humerus.  Complications: None  Fluids:   700 cc  Estimated blood loss: 10 cc  Tourniquet time: None  Drains: None  Closure: Staplesand 4-0 Prolene interrupted sutures.  Brief clinical note: The patient is a 59 year old female with a history of progressively worsening left shoulder pain. The patient's symptoms have progressed despite medications, activity modification, etc. The patient's history and examination are consistent with impingement/tendinopathy. An MRI scan confirmed the tendinopathy changes which showed no evidence for partial or full-thickness rotator cuff tears. The patient presents at this time for definitive management of these shoulder symptoms. In addition, the patient notes persistent radial sided left wrist pain and swelling. Her history and examination are consistent with DeQuervain's tenosynovitis. She also presents at this time for release of the  first dorsal compartment of the left wrist.  Procedure: The patient underwent placement of an interscalene block the preoperative holding area before she was brought into the operating room and lain in the supine position. After adequate general laryngo-mask anesthesia was obtained, the patient was repositioned in the beach chair position using the beach chair positioner. The left shoulder and upper extremity, including the hand, were prepped with ChloraPrep solution before being draped sterilely. Preoperative antibiotics were administered. The wrist was addressed first. A timeout was performed to confirm the proper surgical site before a 2 cm incision was made transversely over the first dorsal part. The incision was carried down through the subcutaneous tissues with care taken toc neurovascular structures in the area. The proximal end of the first dorsal compartment was identified and the tendon sheath bluntly penetrated. The retinaculum was released under direct visualization using Metzenbaum scissors. The underlying tendons were inspected and found to be intact. Each of the tendons was inspected individually so as to release any additional adhesions. The wound was copiously irrigated with sterile saline solution before the wound was closed using 4-0 Prolene interrupted sutures. A total of 5 cc of 0.5% plain Sensorcaine was injected in and around the incision to help with postoperative analgesia before a sterile occlusive dressing was applied to the wound.   Attention was directed to the shoulder. The expected portal sites and incision site were injected with 0.5% Sensorcaine with epinephrine. A posterior portal was created and the glenohumeral joint thoroughly inspected with the findings as described above. An anterior portal was created using an outside-in technique. The labrum and rotator cuff were further probed, again confirming the above-noted findings. The frayed portions of the labrum were debrided  back to stable margins using the full-radius resector. Several areas of reactive synovitis anteriorly and superiorly were debrided using the full-radius resector as well.  The biceps tendon was pulled into the joint with the probe and inspected thoroughly to be sure that there was no evidence for tendinopathy. The ArthroCare wand was inserted and used to obtain hemostasis as well as to "anneal" the labrum superiorly and anteriorly. The instruments were removed from the joint after suctioning the excess fluid.  The camera was repositioned through the posterior portal into the subacromial space. A separate lateral portal was created using an outside-in technique. The 3.5 mm full-radius resector was introduced and used to perform a subtotal bursectomy. The ArthroCare wand was then inserted and used to remove the periosteal tissue off the undersurface of the anterior third of the acromion as well as to recess the coracoacromial ligament from its attachment along the anterior and lateral margins of the acromion. The 4.0 mm acromionizing bur was introduced and used to complete the decompression by removing the undersurface of the anterior third of the acromion. The full radius resector was reintroduced to remove any residual bony debris before the ArthroCare wand was reintroduced to obtain hemostasis. The instruments were then removed from the subacromial space after suctioning the excess fluid.  The portal sites were closed using staples. A sterile bulky dressing was applied to the shoulder before the arm was placed into a shoulder sling. An Ace wrap also was applied to the wrist. The patient was then awakened, extubated, and returned to the recovery room in satisfactory condition after tolerating the procedure well.

## 2015-07-20 NOTE — Anesthesia Procedure Notes (Addendum)
Anesthesia Regional Block:  Interscalene brachial plexus block  Pre-Anesthetic Checklist: ,, timeout performed, Correct Patient, Correct Site, Correct Laterality, Correct Procedure, Correct Position, site marked, Risks and benefits discussed, pre-op evaluation,  At surgeon's request and post-op pain management  Laterality: Left  Prep: chloraprep       Needles:  Injection technique: Single-shot  Needle Type: Stimiplex     Needle Length: 4cm 4 cm Needle Gauge: 21 and 21 G    Additional Needles:  Procedures: ultrasound guided (picture in chart) Interscalene brachial plexus block Narrative:  Injection made incrementally with aspirations every 5 mL.  Performed by: Personally  Anesthesiologist: Estill Batten   Procedure Name: LMA Insertion Date/Time: 07/20/2015 3:37 PM Performed by: Londell Moh Pre-anesthesia Checklist: Patient identified, Emergency Drugs available, Suction available, Timeout performed and Patient being monitored Patient Re-evaluated:Patient Re-evaluated prior to inductionOxygen Delivery Method: Circle system utilized Preoxygenation: Pre-oxygenation with 100% oxygen Intubation Type: IV induction LMA: LMA inserted LMA Size: 4.0 Number of attempts: 1 Placement Confirmation: positive ETCO2 and breath sounds checked- equal and bilateral Tube secured with: Tape

## 2015-07-20 NOTE — Anesthesia Preprocedure Evaluation (Signed)
Anesthesia Evaluation  Patient identified by MRN, date of birth, ID band Patient awake    Reviewed: Allergy & Precautions, NPO status , Patient's Chart, lab work & pertinent test results, reviewed documented beta blocker date and time   History of Anesthesia Complications (+) PONV and history of anesthetic complications  Airway Mallampati: I  TM Distance: >3 FB Neck ROM: Full    Dental no notable dental hx.    Pulmonary asthma , COPD,  COPD inhaler, former smoker,    Pulmonary exam normal        Cardiovascular hypertension, Pt. on medications Normal cardiovascular exam+ dysrhythmias (prlonged QT)      Neuro/Psych  Headaches, PSYCHIATRIC DISORDERS Anxiety Depression  Neuromuscular disease    GI/Hepatic GERD  Controlled and Medicated,(+) Hepatitis -, C  Endo/Other  negative endocrine ROS  Renal/GU negative Renal ROS     Musculoskeletal  (+) Arthritis , Osteoarthritis,    Abdominal   Peds  Hematology negative hematology ROS (+)   Anesthesia Other Findings   Reproductive/Obstetrics                             Anesthesia Physical Anesthesia Plan  ASA: II  Anesthesia Plan: General and Regional   Post-op Pain Management: GA combined w/ Regional for post-op pain   Induction:   Airway Management Planned: LMA  Additional Equipment:   Intra-op Plan:   Post-operative Plan:   Informed Consent: I have reviewed the patients History and Physical, chart, labs and discussed the procedure including the risks, benefits and alternatives for the proposed anesthesia with the patient or authorized representative who has indicated his/her understanding and acceptance.     Plan Discussed with: CRNA  Anesthesia Plan Comments:         Anesthesia Quick Evaluation

## 2015-07-20 NOTE — Anesthesia Postprocedure Evaluation (Signed)
  Anesthesia Post-op Note  Patient: Amanda Bennett  Procedure(s) Performed: Procedure(s): ARTHROSCOPY SHOULDER DEBRIDEMENT AND DECOMPRESSION (Left) RELEASE DORSAL COMPARTMENT (DEQUERVAIN) (Left)  Anesthesia type:General, Regional  Patient location: PACU  Post pain: Pain level controlled  Post assessment: Post-op Vital signs reviewed, Patient's Cardiovascular Status Stable, Respiratory Function Stable, Patent Airway and No signs of Nausea or vomiting  Post vital signs: Reviewed and stable  Last Vitals:  Filed Vitals:   07/20/15 1715  BP: 179/111  Pulse: 73  Temp:   Resp: 17    Level of consciousness: awake, alert  and patient cooperative  Complications: No apparent anesthesia complications

## 2015-07-20 NOTE — Discharge Instructions (Signed)
General Anesthesia, Adult, Care After Refer to this sheet in the next few weeks. These instructions provide you with information on caring for yourself after your procedure. Your health care provider may also give you more specific instructions. Your treatment has been planned according to current medical practices, but problems sometimes occur. Call your health care provider if you have any problems or questions after your procedure. WHAT TO EXPECT AFTER THE PROCEDURE After the procedure, it is typical to experience:  Sleepiness.  Nausea and vomiting. HOME CARE INSTRUCTIONS  For the first 24 hours after general anesthesia:  Have a responsible person with you.  Do not drive a car. If you are alone, do not take public transportation.  Do not drink alcohol.  Do not take medicine that has not been prescribed by your health care provider.  Do not sign important papers or make important decisions.  You may resume a normal diet and activities as directed by your health care provider.  Change bandages (dressings) as directed.  If you have questions or problems that seem related to general anesthesia, call the hospital and ask for the anesthetist or anesthesiologist on call. SEEK MEDICAL CARE IF:  You have nausea and vomiting that continue the day after anesthesia.  You develop a rash. SEEK IMMEDIATE MEDICAL CARE IF:   You have difficulty breathing.  You have chest pain.  You have any allergic problems.   This information is not intended to replace advice given to you by your health care provider. Make sure you discuss any questions you have with your health care provider.   Document Released: 12/03/2000 Document Revised: 09/17/2014 Document Reviewed: 12/26/2011 Elsevier Interactive Patient Education 2016 [employer].  Keep dressing dry and intact.  May shower after dressing changed on post-op day #4 (Sunday).  Cover staples/sutures with Band-Aids after drying off. Apply ice  frequently to shoulder. Keep shoulder immobilizer on at all times except may remove for bathing purposes. Follow-up in 10-14 days or as scheduled.

## 2015-07-20 NOTE — H&P (Signed)
Paper H&P to be scanned into permanent record. H&P reviewed. No changes. 

## 2015-07-20 NOTE — Transfer of Care (Signed)
Immediate Anesthesia Transfer of Care Note  Patient: Amanda Bennett  Procedure(s) Performed: Procedure(s): ARTHROSCOPY SHOULDER DEBRIDEMENT AND DECOMPRESSION (Left) RELEASE DORSAL COMPARTMENT (DEQUERVAIN) (Left)  Patient Location: PACU  Anesthesia Type: General, Regional  Level of Consciousness: awake, alert  and patient cooperative  Airway and Oxygen Therapy: Patient Spontanous Breathing and Patient connected to supplemental oxygen  Post-op Assessment: Post-op Vital signs reviewed, Patient's Cardiovascular Status Stable, Respiratory Function Stable, Patent Airway and No signs of Nausea or vomiting  Post-op Vital Signs: Reviewed and stable  Complications: No apparent anesthesia complications

## 2015-07-21 ENCOUNTER — Encounter: Payer: Self-pay | Admitting: Surgery

## 2015-08-08 ENCOUNTER — Emergency Department (HOSPITAL_COMMUNITY): Payer: Medicare HMO

## 2015-08-08 ENCOUNTER — Encounter (HOSPITAL_COMMUNITY): Payer: Self-pay | Admitting: *Deleted

## 2015-08-08 ENCOUNTER — Emergency Department (HOSPITAL_COMMUNITY)
Admission: EM | Admit: 2015-08-08 | Discharge: 2015-08-08 | Disposition: A | Discharge status: Home / Self Care | Payer: Medicare HMO | Attending: Emergency Medicine | Admitting: Emergency Medicine

## 2015-08-08 DIAGNOSIS — Z8619 Personal history of other infectious and parasitic diseases: Secondary | ICD-10-CM | POA: Insufficient documentation

## 2015-08-08 DIAGNOSIS — M810 Age-related osteoporosis without current pathological fracture: Secondary | ICD-10-CM | POA: Diagnosis not present

## 2015-08-08 DIAGNOSIS — Z8781 Personal history of (healed) traumatic fracture: Secondary | ICD-10-CM | POA: Insufficient documentation

## 2015-08-08 DIAGNOSIS — F419 Anxiety disorder, unspecified: Secondary | ICD-10-CM | POA: Insufficient documentation

## 2015-08-08 DIAGNOSIS — Z79899 Other long term (current) drug therapy: Secondary | ICD-10-CM | POA: Insufficient documentation

## 2015-08-08 DIAGNOSIS — R0602 Shortness of breath: Secondary | ICD-10-CM | POA: Insufficient documentation

## 2015-08-08 DIAGNOSIS — G894 Chronic pain syndrome: Secondary | ICD-10-CM | POA: Insufficient documentation

## 2015-08-08 DIAGNOSIS — R079 Chest pain, unspecified: Secondary | ICD-10-CM | POA: Diagnosis present

## 2015-08-08 DIAGNOSIS — G4700 Insomnia, unspecified: Secondary | ICD-10-CM | POA: Diagnosis not present

## 2015-08-08 DIAGNOSIS — Z791 Long term (current) use of non-steroidal anti-inflammatories (NSAID): Secondary | ICD-10-CM | POA: Insufficient documentation

## 2015-08-08 DIAGNOSIS — F329 Major depressive disorder, single episode, unspecified: Secondary | ICD-10-CM | POA: Diagnosis not present

## 2015-08-08 DIAGNOSIS — Z87828 Personal history of other (healed) physical injury and trauma: Secondary | ICD-10-CM | POA: Diagnosis not present

## 2015-08-08 DIAGNOSIS — R002 Palpitations: Secondary | ICD-10-CM | POA: Diagnosis not present

## 2015-08-08 DIAGNOSIS — K219 Gastro-esophageal reflux disease without esophagitis: Secondary | ICD-10-CM | POA: Insufficient documentation

## 2015-08-08 DIAGNOSIS — M159 Polyosteoarthritis, unspecified: Secondary | ICD-10-CM | POA: Diagnosis not present

## 2015-08-08 DIAGNOSIS — Z87891 Personal history of nicotine dependence: Secondary | ICD-10-CM | POA: Insufficient documentation

## 2015-08-08 DIAGNOSIS — R42 Dizziness and giddiness: Secondary | ICD-10-CM | POA: Diagnosis not present

## 2015-08-08 HISTORY — DX: Essential (primary) hypertension: I10

## 2015-08-08 LAB — BASIC METABOLIC PANEL
Anion gap: 10 (ref 5–15)
BUN: 8 mg/dL (ref 6–20)
CO2: 25 mmol/L (ref 22–32)
Calcium: 10.1 mg/dL (ref 8.9–10.3)
Chloride: 104 mmol/L (ref 101–111)
Creatinine, Ser: 0.64 mg/dL (ref 0.44–1.00)
GFR calc Af Amer: >60 mL/min (ref >60)
GFR calc non Af Amer: >60 mL/min (ref >60)
Glucose, Bld: 100 mg/dL — ABNORMAL HIGH (ref 65–99)
Potassium: 3.9 mmol/L (ref 3.5–5.1)
Sodium: 139 mmol/L (ref 135–145)

## 2015-08-08 LAB — CBC
HCT: 40.4 % (ref 36.0–46.0)
Hemoglobin: 14.3 g/dL (ref 12.0–15.0)
MCH: 32.6 pg (ref 26.0–34.0)
MCHC: 35.4 g/dL (ref 30.0–36.0)
MCV: 92.2 fL (ref 78.0–100.0)
Platelets: 255 10*3/uL (ref 150–400)
RBC: 4.38 MIL/uL (ref 3.87–5.11)
RDW: 13 % (ref 11.5–15.5)
WBC: 9.5 10*3/uL (ref 4.0–10.5)

## 2015-08-08 LAB — I-STAT TROPONIN, ED: Troponin i, poc: 0 ng/mL (ref 0.00–0.08)

## 2015-08-08 MED ORDER — ASPIRIN 81 MG PO CHEW
324.0000 mg | CHEWABLE_TABLET | Freq: Once | ORAL | Status: AC
  Administered 2015-08-08: 324 mg via ORAL
  Filled 2015-08-08: qty 4

## 2015-08-08 MED ORDER — IOHEXOL 350 MG/ML SOLN
80.0000 mL | Freq: Once | INTRAVENOUS | Status: AC | PRN
  Administered 2015-08-08: 80 mL via INTRAVENOUS

## 2015-08-08 MED ORDER — NITROGLYCERIN 0.4 MG SL SUBL
0.4000 mg | SUBLINGUAL_TABLET | SUBLINGUAL | Status: DC | PRN
Start: 2015-08-08 — End: 2015-08-08
  Filled 2015-08-08: qty 1

## 2015-08-08 NOTE — ED Provider Notes (Signed)
CSN: [id number]     Arrival date & time 08/08/15  1054 History   First MD Initiated Contact with Patient 08/08/15 1214     Chief Complaint  Patient presents with  . Chest Pain     (Consider location/radiation/quality/duration/timing/severity/associated sxs/prior Treatment) HPI Amanda Bennett is a 59 y.o. female with history of hypertension, high cholesterol, depression, presents to emergency department complaining of chest pain. Patient states she woke up this morning "not feeling great." States shortly after taking a shower she developed left-sided chest pain that she describes as sharp but also pressure-like. She states it radiated into the left neck. She states she felt like her heart was racing and she felt dizzy and disoriented. Patient states this was followed by a bowel incontinence episode. She states she took her blood pressure at home and it was [id number] systolic. She did not check her heart rate. She states that symptoms lasted approximately an hour and started to subside. She reports she no longer feels short of breath or feels like her heart is racing. She continues to have left-sided chest pressure which she states is 6 out of 10. She states the pain radiating to the neck is improved as well. Patient reports recent shoulder surgery, 3 months ago, arthroscopic. She denies worsening shoulder or arm pain or numbness. Denies fever or chills. No swelling in extremities. No cough or congestion. No recent travel.   Past Medical History  Diagnosis Date  . Foot fracture, right 07/2010    per x-ray 07/2010 - oblique comminuted fifth metatarsal fracture, followed by Dr. Lorelei Pont  . Depression   . Insomnia   . GERD (gastroesophageal reflux disease)   . Carpal tunnel syndrome   . Cervical spinal stenosis 2008    per x-ray findings 2008  . Pulmonary nodule, right     RUL nodule, 7 mm noted on Chest XRAY - 06/2008, per CT follow up in 2009 - No suspicious pulmonary nodules or masse noted  .  Cervicalgia   . Pain in limb   . Lumbago   . Chronic pain syndrome   . Cervical syndrome   . Primary localized osteoarthrosis, pelvic region and thigh   . Lesion of ulnar nerve   . Anxiety   . Osteoporosis   . Unexplained weight loss   . Visual disturbance   . Cough   . Arthritis pain   . Bruises easily   . Wound disruption, post-op, skin   . Hepatitis     hx of Hep C   . Wears glasses   . PONV (postoperative nausea and vomiting)     shortness of breath, nausea  . Dysrhythmia     prolonged QT  . Wears contact lenses    Past Surgical History  Procedure Laterality Date  . Abdominal hysterectomy    . Finger amputated      right ring  . Drain in left ring finger      cat bite  . Broken clavical    . Cholecystectomy    . Hemorrhoid surgery  11/25/2006  . Cholecystectomy  10/30/2011    Procedure: LAPAROSCOPIC CHOLECYSTECTOMY WITH INTRAOPERATIVE CHOLANGIOGRAM;  Surgeon: Imogene Burn. Georgette Dover, MD;  Location: [facility];  Service: General;  Laterality: N/A;  . Carpal tunnel release      right and left  . Colonoscopy    . Inguinal lymph node biopsy Right 02/03/2014    Procedure: INGUINAL LYMPH NODE BIOPSY;  Surgeon: Zenovia Jarred, MD;  Location: [facility];  Service: General;  Laterality: Right;  right inguinal area  . Shoulder arthroscopy Left 07/20/2015    Procedure: ARTHROSCOPY SHOULDER DEBRIDEMENT AND DECOMPRESSION;  Surgeon: Corky Mull, MD;  Location: [facility];  Service: Orthopedics;  Laterality: Left;  . Dorsal compartment release Left 07/20/2015    Procedure: RELEASE DORSAL COMPARTMENT (DEQUERVAIN);  Surgeon: Corky Mull, MD;  Location: [facility];  Service: Orthopedics;  Laterality: Left;   Family History  Problem Relation Age of Onset  . Stroke Neg Hx   . Stomach cancer Neg Hx   . Cancer Father     colon/prostate  . Colon cancer Father 14    deceased at age 26 from colon cancer  . Cancer Paternal Grandmother     ovarian   Social  History  Substance Use Topics  . Smoking status: Former Smoker -- 0.50 packs/day for 20 years    Types: Cigarettes    Quit date: 10/24/2014  . Smokeless tobacco: Never Used  . Alcohol Use: No   OB History    No data available     Review of Systems  Constitutional: Negative for fever and chills.  Respiratory: Positive for chest tightness and shortness of breath. Negative for cough.   Cardiovascular: Positive for chest pain and palpitations. Negative for leg swelling.  Gastrointestinal: Negative for nausea, vomiting, abdominal pain and diarrhea.  Genitourinary: Negative for dysuria, flank pain, vaginal bleeding, vaginal discharge, vaginal pain and pelvic pain.  Musculoskeletal: Negative for myalgias, arthralgias, neck pain and neck stiffness.  Skin: Negative for rash.  Neurological: Positive for dizziness and light-headedness. Negative for weakness and headaches.  All other systems reviewed and are negative.     Allergies  Morphine and related; Hydrocodone; Prozac; and Tramadol  Home Medications   Prior to Admission medications   Medication Sig Start Date End Date Taking? Authorizing Provider  albuterol (PROVENTIL HFA;VENTOLIN HFA) 108 (90 BASE) MCG/ACT inhaler Inhale 1-2 puffs into the lungs every 6 (six) hours as needed for wheezing or shortness of breath.    Historical Provider, MD  amLODipine (NORVASC) 5 MG tablet Take 1 tablet (5 mg total) by mouth daily. 08/24/14   Dellia Nims, MD  Calcium Carbonate-Vitamin D (CALCIUM 600/VITAMIN D) 600-400 MG-UNIT per tablet Take 2 tablets by mouth daily. 10/08/14   Lucious Groves, DO  cyclobenzaprine (FLEXERIL) 10 MG tablet Take 1 tablet (10 mg total) by mouth 3 (three) times daily as needed for muscle spasms. Patient not taking: Reported on 01/20/2015 12/20/14   Dellia Nims, MD  diphenhydrAMINE (BENADRYL) 25 MG tablet Take 25 mg by mouth every 6 (six) hours as needed for sleep.    Historical Provider, MD  gabapentin (NEURONTIN) 300 MG  capsule Take 600 mg by mouth 2 (two) times daily.    Historical Provider, MD  ibuprofen (ADVIL,MOTRIN) 200 MG tablet Take 3 tablets (600 mg total) by mouth 3 (three) times daily. 07/20/15   Corky Mull, MD  omeprazole (PRILOSEC) 20 MG capsule Take 20 mg by mouth daily.    Historical Provider, MD  ondansetron (ZOFRAN-ODT) 4 MG disintegrating tablet Take 1 tablet (4 mg total) by mouth every 8 (eight) hours as needed for nausea or vomiting. 07/20/15   Corky Mull, MD  oxyCODONE (ROXICODONE) 5 MG immediate release tablet Take 1-2 tablets (5-10 mg total) by mouth every 4 (four) hours as needed for severe pain. 07/20/15   Corky Mull, MD  pregabalin (LYRICA) 150 MG capsule Take 150 mg by mouth  2 (two) times daily.    Historical Provider, MD  traZODone (DESYREL) 50 MG tablet Take 50 mg by mouth at bedtime. 1-2 tabs, as needed    Historical Provider, MD  zolpidem (AMBIEN) 5 MG tablet Take 1 tablet (5 mg total) by mouth at bedtime as needed for sleep. Patient not taking: Reported on 01/20/2015 10/15/14   Dellia Nims, MD   BP 172/96 mmHg  Pulse 78  Temp(Src) 97.8 F (36.6 C) (Oral)  Resp 18  Ht 5\' 4"  (1.626 m)  Wt 52.617 kg  BMI 19.90 kg/m2  SpO2 100%  LMP 10/23/2002 Physical Exam  Constitutional: She is oriented to person, place, and time. She appears well-developed and well-nourished. No distress.  HENT:  Head: Normocephalic.  Eyes: Conjunctivae and EOM are normal. Pupils are equal, round, and reactive to light.  Neck: Neck supple.  Cardiovascular: Normal rate, regular rhythm and normal heart sounds.   Pulmonary/Chest: Effort normal and breath sounds normal. No respiratory distress. She has no wheezes. She has no rales.  Abdominal: Soft. Bowel sounds are normal. She exhibits no distension. There is no tenderness. There is no rebound.  Musculoskeletal: She exhibits no edema.  Neurological: She is alert and oriented to person, place, and time. No cranial nerve deficit. Coordination normal.  Skin:  Skin is warm and dry.  Psychiatric: She has a normal mood and affect. Her behavior is normal.  Nursing note and vitals reviewed.   ED Course  Procedures (including critical care time) Labs Review Labs Reviewed  BASIC METABOLIC PANEL - Abnormal; Notable for the following:    Glucose, Bld 100 (*)    All other components within normal limits  CBC  I-STAT TROPOININ, ED    Imaging Review Dg Chest 2 View  08/08/2015  CLINICAL DATA:  CHEST PRESSURE/FLUTTER,NAUSEA.FULLNESS IN THROAT,LEFT CHEST PAIN ex-smoker EXAM: CHEST  2 VIEW COMPARISON:  04/02/2015 FINDINGS: Hyperinflation. Mild accentuation of expected thoracic kyphosis. Remote right rib trauma. Midline trachea. Normal heart size. Atherosclerosis in the transverse aorta. No pleural effusion or pneumothorax. Right apical pleural parenchymal scarring. EKG lead attachment artifact projects over the left upper lobe on the frontal radiograph. Lungs are otherwise clear. IMPRESSION: Hyperinflation, without acute disease. Transverse aortic atherosclerosis which is age advanced. Electronically Signed   By: Abigail Miyamoto M.D.   On: 08/08/2015 12:22   I have personally reviewed and evaluated these images and lab results as part of my medical decision-making.   EKG Interpretation   Date/Time:  Monday August 08 2015 11:06:06 EST Ventricular Rate:  75 PR Interval:  158 QRS Duration: 102 QT Interval:  396 QTC Calculation: 442 R Axis:   86 Text Interpretation:  Normal sinus rhythm Incomplete right bundle branch  block Cannot rule out Anterior infarct , age undetermined Abnormal ECG  since last tracing no significant change Confirmed by MILLER  MD, BRIAN  [phone]) on 08/08/2015 11:55:36 AM      MDM   Final diagnoses:  Chest pain, unspecified chest pain type   12:27 PM Pt with episode of chest pain, palpitations, dizzines, onset this morning. Pain radiated from chest to the neck. Pt states symptoms currently improved. Will get labs, CXR,  monitor  1:58 PM Discussed with Dr. Sabra Heck who has seen pt as well. Pt reported to him episode of similar palpitations, he reports her HR was normal in 70s at that time, in normal sinus rhythm on the monitor. Her symptoms atypical for cad, stress test a year ago negative. Will get CT agnio to  rule out dissection.   2:18 PM pts CT is negative other than artherosclerotic disease. Plan to dc home with close outpatient follow up  Filed Vitals:   08/08/15 1430 08/08/15 1445 08/08/15 1500 08/08/15 1521  BP: 163/143 161/97 153/104 149/86  Pulse: 69 65 63   Temp:    98.1 F (36.7 C)  TempSrc:    Oral  Resp: 21 18 13 16   Height:      Weight:      SpO2: 100% 100% 98% 99%   .   Jeannett Senior, PA-C 08/10/15 0250  Noemi Chapel, MD 08/10/15 [phone]

## 2015-08-08 NOTE — Progress Notes (Signed)
Only scan CT Angio Chest looking for dissection per Jeannett Senior do not include abdomen pelvis.

## 2015-08-08 NOTE — ED Notes (Signed)
Pt reports mid chest pain this am with nausea, sob and pain into her throat and left arm.

## 2015-08-08 NOTE — Discharge Instructions (Signed)
Please follow up with your doctor as soon as able for recheck. Return if worsening symptoms.    Nonspecific Chest Pain  Chest pain can be caused by many different conditions. There is always a chance that your pain could be related to something serious, such as a heart attack or a blood clot in your lungs. Chest pain can also be caused by conditions that are not life-threatening. If you have chest pain, it is very important to follow up with your health care provider. CAUSES  Chest pain can be caused by:  Heartburn.  Pneumonia or bronchitis.  Anxiety or stress.  Inflammation around your heart (pericarditis) or lung (pleuritis or pleurisy).  A blood clot in your lung.  A collapsed lung (pneumothorax). It can develop suddenly on its own (spontaneous pneumothorax) or from trauma to the chest.  Shingles infection (varicella-zoster virus).  Heart attack.  Damage to the bones, muscles, and cartilage that make up your chest wall. This can include:  Bruised bones due to injury.  Strained muscles or cartilage due to frequent or repeated coughing or overwork.  Fracture to one or more ribs.  Sore cartilage due to inflammation (costochondritis). RISK FACTORS  Risk factors for chest pain may include:  Activities that increase your risk for trauma or injury to your chest.  Respiratory infections or conditions that cause frequent coughing.  Medical conditions or overeating that can cause heartburn.  Heart disease or family history of heart disease.  Conditions or health behaviors that increase your risk of developing a blood clot.  Having had chicken pox (varicella zoster). SIGNS AND SYMPTOMS Chest pain can feel like:  Burning or tingling on the surface of your chest or deep in your chest.  Crushing, pressure, aching, or squeezing pain.  Dull or sharp pain that is worse when you move, cough, or take a deep breath.  Pain that is also felt in your back, neck, shoulder, or arm,  or pain that spreads to any of these areas. Your chest pain may come and go, or it may stay constant. DIAGNOSIS Lab tests or other studies may be needed to find the cause of your pain. Your health care provider may have you take a test called an ambulatory ECG (electrocardiogram). An ECG records your heartbeat patterns at the time the test is performed. You may also have other tests, such as:  Transthoracic echocardiogram (TTE). During echocardiography, sound waves are used to create a picture of all of the heart structures and to look at how blood flows through your heart.  Transesophageal echocardiogram (TEE).This is a more advanced imaging test that obtains images from inside your body. It allows your health care provider to see your heart in finer detail.  Cardiac monitoring. This allows your health care provider to monitor your heart rate and rhythm in real time.  Holter monitor. This is a portable device that records your heartbeat and can help to diagnose abnormal heartbeats. It allows your health care provider to track your heart activity for several days, if needed.  Stress tests. These can be done through exercise or by taking medicine that makes your heart beat more quickly.  Blood tests.  Imaging tests. TREATMENT  Your treatment depends on what is causing your chest pain. Treatment may include:  Medicines. These may include:  Acid blockers for heartburn.  Anti-inflammatory medicine.  Pain medicine for inflammatory conditions.  Antibiotic medicine, if an infection is present.  Medicines to dissolve blood clots.  Medicines to treat coronary artery  disease.  Supportive care for conditions that do not require medicines. This may include:  Resting.  Applying heat or cold packs to injured areas.  Limiting activities until pain decreases. HOME CARE INSTRUCTIONS  If you were prescribed an antibiotic medicine, finish it all even if you start to feel better.  Avoid  any activities that bring on chest pain.  Do not use any tobacco products, including cigarettes, chewing tobacco, or electronic cigarettes. If you need help quitting, ask your health care provider.  Do not drink alcohol.  Take medicines only as directed by your health care provider.  Keep all follow-up visits as directed by your health care provider. This is important. This includes any further testing if your chest pain does not go away.  If heartburn is the cause for your chest pain, you may be told to keep your head raised (elevated) while sleeping. This reduces the chance that acid will go from your stomach into your esophagus.  Make lifestyle changes as directed by your health care provider. These may include:  Getting regular exercise. Ask your health care provider to suggest some activities that are safe for you.  Eating a heart-healthy diet. A registered dietitian can help you to learn healthy eating options.  Maintaining a healthy weight.  Managing diabetes, if necessary.  Reducing stress. SEEK MEDICAL CARE IF:  Your chest pain does not go away after treatment.  You have a rash with blisters on your chest.  You have a fever. SEEK IMMEDIATE MEDICAL CARE IF:   Your chest pain is worse.  You have an increasing cough, or you cough up blood.  You have severe abdominal pain.  You have severe weakness.  You faint.  You have chills.  You have sudden, unexplained chest discomfort.  You have sudden, unexplained discomfort in your arms, back, neck, or jaw.  You have shortness of breath at any time.  You suddenly start to sweat, or your skin gets clammy.  You feel nauseous or you vomit.  You suddenly feel light-headed or dizzy.  Your heart begins to beat quickly, or it feels like it is skipping beats. These symptoms may represent a serious problem that is an emergency. Do not wait to see if the symptoms will go away. Get medical help right away. Call your local  emergency services (911 in the [place] Do not drive yourself to the hospital.   This information is not intended to replace advice given to you by your health care provider. Make sure you discuss any questions you have with your health care provider.   Document Released: 06/06/2005 Document Revised: 09/17/2014 Document Reviewed: 04/02/2014 Elsevier Interactive Patient Education [employer].

## 2015-08-08 NOTE — ED Provider Notes (Signed)
The patient is a 59 year old female, she has a history of high blood pressure, she takes her medications as prescribed, she reports that she had some severe chest pain this morning, radiation to the neck and the shoulder and the back, within the last recent time frame she had a negative stress test, she reports that this pain is fluctuating today. It is feeling like she is having palpitations. On exam the patient has a normal heart rate, EKG is unremarkable, when she is actively having palpitations and feeling as though her heart is racing on the monitor she has a pulse of 70 in normal sinus rhythm. She has no peripheral edema, she is otherwise well-appearing.  Possible anxiety or panic, less likely be cardiac, consider PE given recent shoulder surgery and intermittent immobilization. When also consider dissection the less likely but she is severely hypertensive today. CT scan ordered  Medical screening examination/treatment/procedure(s) were conducted as a shared visit with non-physician practitioner(s) and myself.  I personally evaluated the patient during the encounter.  Clinical Impression:   Final diagnoses:  Chest pain, unspecified chest pain type        EKG Interpretation  Date/Time:  Monday August 08 2015 11:06:06 EST Ventricular Rate:  75 PR Interval:  158 QRS Duration: 102 QT Interval:  396 QTC Calculation: 442 R Axis:   86 Text Interpretation:  Normal sinus rhythm Incomplete right bundle branch block Cannot rule out Anterior infarct , age undetermined Abnormal ECG since last tracing no significant change Confirmed by Sabra Heck  MD, [place] ([id number]) on 08/08/2015 11:55:36 AM        Noemi Chapel, MD 08/10/15 1035

## 2015-10-04 ENCOUNTER — Ambulatory Visit: Payer: Medicare Other | Admitting: Cardiovascular Disease

## 2015-11-15 ENCOUNTER — Ambulatory Visit (INDEPENDENT_AMBULATORY_CARE_PROVIDER_SITE_OTHER): Payer: Medicare Other | Admitting: Cardiovascular Disease

## 2015-11-15 ENCOUNTER — Encounter: Payer: Self-pay | Admitting: Cardiovascular Disease

## 2015-11-15 VITALS — BP 112/78 | HR 72 | Ht 64.0 in | Wt 115.8 lb

## 2015-11-15 DIAGNOSIS — I10 Essential (primary) hypertension: Secondary | ICD-10-CM | POA: Diagnosis not present

## 2015-11-15 MED ORDER — LOSARTAN POTASSIUM 25 MG PO TABS: 25.0000 mg | ORAL_TABLET | Freq: Every day | ORAL | Status: DC

## 2015-11-15 NOTE — Patient Instructions (Addendum)
Medication Instructions:  Your physician has recommended you make the following change in your medication:  1. STOP Lisinopril 2. START Losartan 25mg  take one tablet by mouth daily  Labwork: Your physician recommends that you return for a FASTING LIPID and CMP--nothing to eat or drink after midnight, lab opens at 7:30 AM  Testing/Procedures: No new orders.   Follow-Up: Your physician wants you to follow-up in: 1 YEAR with Dr Fletcher Anon.  You will receive a reminder letter in the mail two months in advance. If you don't receive a letter, please call our office to schedule the follow-up appointment.   Any Other Special Instructions Will Be Listed Below (If Applicable).     If you need a refill on your cardiac medications before your next appointment, please call your pharmacy.

## 2015-11-15 NOTE — Progress Notes (Signed)
Cardiology Office Note   Date:  11/15/2015   ID:  LLULIANA Bennett, DOB Aug 09, 1956, MRN [id number]  PCP:  Alvester Chou, NP  Cardiologist:   Kathlyn Sacramento, MD   No chief complaint on file.     History of Present Illness: Amanda Bennett is a 60 y.o. female who presents for a follow up visit regarding hypertension, hyperlipidemia and previously prolonged QT interval.  She has history of previous tobacco use, hypertension, anxiety and hepatitis C. she quit smoking in 2016. She has family history of prolonged QT syndrome and she did have mildly prolonged QT on EKG in the setting of treatment with medications that can cause that. QT went back to normal after stopping the medications. She was seen by Dr. Caryl Comes and no further workup was recommended.. Nuclear stress test in 2015 was normal. Previous CT scan did show evidence of coronary atherosclerosis. Holter monitor in 2016 showed normal sinus rhythm with rare PACs and PVCs. She has been doing reasonably well with stable exertional dyspnea. No chest pain. She does complain of dry hacking cough over the last few months. She was started on lisinopril about 6 months ago. She did have some palpitations in November one week after she had shoulder surgery. She went to the emergency room with negative workup. She reports no recurrent symptoms since then.    Past Medical History  Diagnosis Date  . Foot fracture, right 07/2010    per x-ray 07/2010 - oblique comminuted fifth metatarsal fracture, followed by Dr. Lorelei Pont  . Depression   . Insomnia   . GERD (gastroesophageal reflux disease)   . Carpal tunnel syndrome   . Cervical spinal stenosis 2008    per x-ray findings 2008  . Pulmonary nodule, right     RUL nodule, 7 mm noted on Chest XRAY - 06/2008, per CT follow up in 2009 - No suspicious pulmonary nodules or masse noted  . Cervicalgia   . Pain in limb   . Lumbago   . Chronic pain syndrome   . Cervical syndrome   . Primary localized  osteoarthrosis, pelvic region and thigh   . Lesion of ulnar nerve   . Anxiety   . Osteoporosis   . Unexplained weight loss   . Visual disturbance   . Cough   . Arthritis pain   . Bruises easily   . Wound disruption, post-op, skin   . Hepatitis     hx of Hep C   . Wears glasses   . PONV (postoperative nausea and vomiting)     shortness of breath, nausea  . Dysrhythmia     prolonged QT  . Wears contact lenses   . Hypertension     Past Surgical History  Procedure Laterality Date  . Abdominal hysterectomy    . Finger amputated      right ring  . Drain in left ring finger      cat bite  . Broken clavical    . Cholecystectomy    . Hemorrhoid surgery  11/25/2006  . Cholecystectomy  10/30/2011    Procedure: LAPAROSCOPIC CHOLECYSTECTOMY WITH INTRAOPERATIVE CHOLANGIOGRAM;  Surgeon: Imogene Burn. Georgette Dover, MD;  Location: [facility];  Service: General;  Laterality: N/A;  . Carpal tunnel release      right and left  . Colonoscopy    . Inguinal lymph node biopsy Right 02/03/2014    Procedure: INGUINAL LYMPH NODE BIOPSY;  Surgeon: Zenovia Jarred, MD;  Location: [place];  Service:  General;  Laterality: Right;  right inguinal area  . Shoulder arthroscopy Left 07/20/2015    Procedure: ARTHROSCOPY SHOULDER DEBRIDEMENT AND DECOMPRESSION;  Surgeon: Corky Mull, MD;  Location: [place];  Service: Orthopedics;  Laterality: Left;  . Dorsal compartment release Left 07/20/2015    Procedure: RELEASE DORSAL COMPARTMENT (DEQUERVAIN);  Surgeon: Corky Mull, MD;  Location: [place];  Service: Orthopedics;  Laterality: Left;     Current Outpatient Prescriptions  Medication Sig Dispense Refill  . albuterol (PROVENTIL HFA;VENTOLIN HFA) 108 (90 BASE) MCG/ACT inhaler Inhale 1-2 puffs into the lungs every 6 (six) hours as needed for wheezing or shortness of breath.    Marland Kitchen amLODipine (NORVASC) 5 MG tablet Take 1 tablet (5 mg total) by mouth daily. 90 tablet 1  . clonazePAM  (KLONOPIN) 0.5 MG tablet Take 0.5 mg by mouth 3 (three) times daily as needed.     Marland Kitchen ibuprofen (ADVIL,MOTRIN) 200 MG tablet Take 3 tablets (600 mg total) by mouth 3 (three) times daily. 30 tablet 0  . lisinopril (PRINIVIL,ZESTRIL) 5 MG tablet Take 5 mg by mouth daily.    Marland Kitchen omeprazole (PRILOSEC) 20 MG capsule Take 20 mg by mouth daily.    . traZODone (DESYREL) 50 MG tablet Take 50-100 mg by mouth at bedtime. 1-2 tabs, as needed     No current facility-administered medications for this visit.    Allergies:   Morphine and related; Fluoxetine hcl; Hydrocodone; Oxycodone; Prozac; and Tramadol    Social History:  The patient  reports that she quit smoking about 12 months ago. Her smoking use included Cigarettes. She has a 10 pack-year smoking history. She has never used smokeless tobacco. She reports that she does not drink alcohol or use illicit drugs.   Family History:  The patient's family history includes Cancer in her father and paternal grandmother; Colon cancer (age of onset: 81) in her father. There is no history of Stroke or Stomach cancer.    ROS:  Please see the history of present illness.   Otherwise, review of systems are positive for none.   All other systems are reviewed and negative.    PHYSICAL EXAM: VS:  BP 112/78 mmHg  Pulse 72  Ht 5\' 4"  (1.626 m)  Wt 115 lb 12.8 oz (52.527 kg)  BMI 19.87 kg/m2  SpO2 98%  LMP 10/23/2002 , BMI Body mass index is 19.87 kg/(m^2). GEN: Well nourished, well developed, in no acute distress HEENT: normal Neck: no JVD, carotid bruits, or masses Cardiac: RRR; no murmurs, rubs, or gallops,no edema  Respiratory:  clear to auscultation bilaterally, normal work of breathing GI: soft, nontender, nondistended, + BS MS: no deformity or atrophy Skin: warm and dry, no rash Neuro:  Strength and sensation are intact Psych: euthymic mood, full affect   EKG:  EKG is not ordered today.    Recent Labs: 08/08/2015: BUN 8; Creatinine, Ser 0.64;  Hemoglobin 14.3; Platelets 255; Potassium 3.9; Sodium 139    Lipid Panel    Component Value Date/Time   CHOL 255* 04/08/2012 1125   TRIG 408* 04/08/2012 1125   HDL 43 04/08/2012 1125   CHOLHDL 5.9 04/08/2012 1125   VLDL NOT CALC 04/08/2012 1125   LDLCALC  04/08/2012 1125     Comment:       Not calculated due to Triglyceride >400. Suggest ordering Direct LDL (Unit Code: [phone]).   Total Cholesterol/HDL Ratio:CHD Risk  Coronary Heart Disease Risk Table                                        Men       Women          1/2 Average Risk              3.4        3.3              Average Risk              5.0        4.4           2X Average Risk              9.6        7.1           3X Average Risk             23.4       11.0 Use the calculated Patient Ratio above and the CHD Risk table  to determine the patient's CHD Risk. ATP III Classification (LDL):       < 100        mg/dL         Optimal      100 - 129     mg/dL         Near or Above Optimal      130 - 159     mg/dL         Borderline High      160 - 189     mg/dL         High       > 190        mg/dL         Very High     LDLDIRECT 136* 04/08/2012 1125      Wt Readings from Last 3 Encounters:  11/15/15 115 lb 12.8 oz (52.527 kg)  08/08/15 116 lb (52.617 kg)  07/20/15 116 lb (52.617 kg)        ASSESSMENT AND PLAN:  1.  Palpitations: Previous Holter monitor showed rare PACs and PVCs. Symptoms have resolved without intervention. Continue observation.  2. Hyperlipidemia: I requested fasting lipid and liver profile with a low threshold to start treatment with a statin given the previous CT scan showed evidence of coronary atherosclerosis.  3. Essential hypertension: Blood pressure is reasonably controlled. However, her dry cough is likely due to lisinopril and thus I switched her to losartan.       Disposition:   FU with me in 1 year  Signed,  Kathlyn Sacramento, MD  11/15/2015 10:19 AM    [place]

## 2015-11-16 ENCOUNTER — Other Ambulatory Visit: Payer: Medicare Other

## 2015-11-17 ENCOUNTER — Telehealth: Payer: Self-pay | Admitting: *Deleted

## 2015-11-17 ENCOUNTER — Other Ambulatory Visit (INDEPENDENT_AMBULATORY_CARE_PROVIDER_SITE_OTHER): Payer: Medicare Other | Admitting: *Deleted

## 2015-11-17 DIAGNOSIS — Z79899 Other long term (current) drug therapy: Secondary | ICD-10-CM

## 2015-11-17 DIAGNOSIS — E785 Hyperlipidemia, unspecified: Secondary | ICD-10-CM

## 2015-11-17 DIAGNOSIS — I10 Essential (primary) hypertension: Secondary | ICD-10-CM | POA: Diagnosis not present

## 2015-11-17 LAB — COMPLETE METABOLIC PANEL WITH GFR
ALT: 9 U/L (ref 6–29)
AST: 17 U/L (ref 10–35)
Albumin: 4.2 g/dL (ref 3.6–5.1)
Alkaline Phosphatase: 64 U/L (ref 33–130)
BUN: 10 mg/dL (ref 7–25)
CO2: 28 mmol/L (ref 20–31)
Calcium: 9.5 mg/dL (ref 8.6–10.4)
Chloride: 105 mmol/L (ref 98–110)
Creat: 0.54 mg/dL (ref 0.50–1.05)
GFR, Est African American: >89 mL/min (ref >=60)
GFR, Est Non African American: >89 mL/min (ref >=60)
Glucose, Bld: 81 mg/dL (ref 65–99)
Potassium: 3.7 mmol/L (ref 3.5–5.3)
Sodium: 141 mmol/L (ref 135–146)
Total Bilirubin: 0.4 mg/dL (ref 0.2–1.2)
Total Protein: 6.9 g/dL (ref 6.1–8.1)

## 2015-11-17 LAB — LIPID PANEL
Cholesterol: 234 mg/dL — ABNORMAL HIGH (ref 125–200)
HDL: 55 mg/dL (ref >=46)
LDL Cholesterol: 153 mg/dL — ABNORMAL HIGH (ref <130)
Total CHOL/HDL Ratio: 4.3 Ratio (ref <=5.0)
Triglycerides: 131 mg/dL (ref <150)
VLDL: 26 mg/dL (ref <30)

## 2015-11-17 NOTE — Telephone Encounter (Signed)
Patient calling for her lab results from this morning. She states that she was told that the results would be available today. Please advise. Thanks, MI

## 2015-11-18 DIAGNOSIS — M545 Low back pain: Secondary | ICD-10-CM | POA: Diagnosis not present

## 2015-11-18 DIAGNOSIS — M797 Fibromyalgia: Secondary | ICD-10-CM | POA: Diagnosis not present

## 2015-11-18 DIAGNOSIS — M25512 Pain in left shoulder: Secondary | ICD-10-CM | POA: Diagnosis not present

## 2015-11-18 DIAGNOSIS — I10 Essential (primary) hypertension: Secondary | ICD-10-CM | POA: Diagnosis not present

## 2015-11-18 NOTE — Telephone Encounter (Signed)
Patient calling to see if Dr Fletcher Anon has received her lab work to see what prescription she will need. She wanted to try to get her rx by tomorrow at 12 noon if possible. I let her know Dr. Fletcher Anon has not completely read her labs and have not decided the medication but as soon as he completely reads and decides the medication she will get a call from [place] letting her know what to take and how to take it.

## 2015-11-21 DIAGNOSIS — M7582 Other shoulder lesions, left shoulder: Secondary | ICD-10-CM | POA: Diagnosis not present

## 2015-11-21 DIAGNOSIS — M778 Other enthesopathies, not elsewhere classified: Secondary | ICD-10-CM | POA: Diagnosis not present

## 2015-11-21 DIAGNOSIS — M19012 Primary osteoarthritis, left shoulder: Secondary | ICD-10-CM | POA: Diagnosis not present

## 2015-11-22 MED ORDER — ATORVASTATIN CALCIUM 40 MG PO TABS: 40.0000 mg | ORAL_TABLET | Freq: Every day | ORAL | Status: DC

## 2015-11-22 NOTE — Telephone Encounter (Signed)
Pt returned lab results call.  Recommendations given to patient from recent labwork.  Atorva 40mg  daily called to pharmacy. Pt instructed to start and recheck labs in 6 weeks. Also advised to call if any new problems or if SE's taking med. Pt voiced understanding.

## 2015-11-22 NOTE — Telephone Encounter (Signed)
Notes Recorded by Wellington Hampshire, MD on 11/18/2015 at 6:05 PM Inform patient that labs were normal. Cholesterol was very high. Recommend Atorvastatin 40 mg once daily. Repeat labs in 6 weeks.  Left message on machine for pt to contact the office.

## 2015-12-23 DIAGNOSIS — M25512 Pain in left shoulder: Secondary | ICD-10-CM | POA: Diagnosis not present

## 2015-12-23 DIAGNOSIS — I10 Essential (primary) hypertension: Secondary | ICD-10-CM | POA: Diagnosis not present

## 2015-12-23 DIAGNOSIS — M25511 Pain in right shoulder: Secondary | ICD-10-CM | POA: Diagnosis not present

## 2015-12-23 DIAGNOSIS — M545 Low back pain: Secondary | ICD-10-CM | POA: Diagnosis not present

## 2015-12-23 DIAGNOSIS — M797 Fibromyalgia: Secondary | ICD-10-CM | POA: Diagnosis not present

## 2015-12-30 DIAGNOSIS — M778 Other enthesopathies, not elsewhere classified: Secondary | ICD-10-CM | POA: Diagnosis not present

## 2015-12-30 DIAGNOSIS — M7582 Other shoulder lesions, left shoulder: Secondary | ICD-10-CM | POA: Diagnosis not present

## 2015-12-30 DIAGNOSIS — M19012 Primary osteoarthritis, left shoulder: Secondary | ICD-10-CM | POA: Diagnosis not present

## 2016-01-06 ENCOUNTER — Encounter: Payer: Self-pay | Admitting: *Deleted

## 2016-01-06 ENCOUNTER — Other Ambulatory Visit: Payer: Medicare Other

## 2016-01-06 NOTE — Pre-Procedure Instructions (Signed)
Progress Notes Info    Chief Strategy Officer Note Status Last Update User Last Update Date/Time   Wellington Hampshire, MD Signed Wellington Hampshire, MD 11/18/2015 6:04 PM    Progress Notes    Expand All Collapse All      Cardiology Office Note   Date: 11/15/2015   ID: Amanda Bennett, DOB April 15, 1956, MRN [id number]  PCP: Alvester Chou, NP Cardiologist: Kathlyn Sacramento, MD   No chief complaint on file.    History of Present Illness: Amanda Bennett is a 60 y.o. female who presents for a follow up visit regarding hypertension, hyperlipidemia and previously prolonged QT interval.  She has history of previous tobacco use, hypertension, anxiety and hepatitis C. she quit smoking in 2016. She has family history of prolonged QT syndrome and she did have mildly prolonged QT on EKG in the setting of treatment with medications that can cause that. QT went back to normal after stopping the medications. She was seen by Dr. Caryl Comes and no further workup was recommended.. Nuclear stress test in 2015 was normal. Previous CT scan did show evidence of coronary atherosclerosis. Holter monitor in 2016 showed normal sinus rhythm with rare PACs and PVCs. She has been doing reasonably well with stable exertional dyspnea. No chest pain. She does complain of dry hacking cough over the last few months. She was started on lisinopril about 6 months ago. She did have some palpitations in November one week after she had shoulder surgery. She went to the emergency room with negative workup. She reports no recurrent symptoms since then.    Past Medical History  Diagnosis Date  . Foot fracture, right 07/2010    per x-ray 07/2010 - oblique comminuted fifth metatarsal fracture, followed by Dr. Lorelei Pont  . Depression   . Insomnia   . GERD (gastroesophageal reflux disease)   . Carpal tunnel syndrome   . Cervical spinal stenosis 2008    per x-ray findings 2008  . Pulmonary nodule, right      RUL nodule, 7 mm noted on Chest XRAY - 06/2008, per CT follow up in 2009 - No suspicious pulmonary nodules or masse noted  . Cervicalgia   . Pain in limb   . Lumbago   . Chronic pain syndrome   . Cervical syndrome   . Primary localized osteoarthrosis, pelvic region and thigh   . Lesion of ulnar nerve   . Anxiety   . Osteoporosis   . Unexplained weight loss   . Visual disturbance   . Cough   . Arthritis pain   . Bruises easily   . Wound disruption, post-op, skin   . Hepatitis     hx of Hep C   . Wears glasses   . PONV (postoperative nausea and vomiting)     shortness of breath, nausea  . Dysrhythmia     prolonged QT  . Wears contact lenses   . Hypertension     Past Surgical History  Procedure Laterality Date  . Abdominal hysterectomy    . Finger amputated      right ring  . Drain in left ring finger      cat bite  . Broken clavical    . Cholecystectomy    . Hemorrhoid surgery  11/25/2006  . Cholecystectomy  10/30/2011    Procedure: LAPAROSCOPIC CHOLECYSTECTOMY WITH INTRAOPERATIVE CHOLANGIOGRAM; Surgeon: Imogene Burn. Georgette Dover, MD; Location: [facility]; Service: General; Laterality: N/A;  . Carpal tunnel release      right and left  .  Colonoscopy    . Inguinal lymph node biopsy Right 02/03/2014    Procedure: INGUINAL LYMPH NODE BIOPSY; Surgeon: Zenovia Jarred, MD; Location: [place]; Service: General; Laterality: Right; right inguinal area  . Shoulder arthroscopy Left 07/20/2015    Procedure: ARTHROSCOPY SHOULDER DEBRIDEMENT AND DECOMPRESSION; Surgeon: Corky Mull, MD; Location: [place]; Service: Orthopedics; Laterality: Left;  . Dorsal compartment release Left 07/20/2015    Procedure: RELEASE DORSAL COMPARTMENT (DEQUERVAIN); Surgeon: Corky Mull, MD; Location: [place]; Service:  Orthopedics; Laterality: Left;     Current Outpatient Prescriptions  Medication Sig Dispense Refill  . albuterol (PROVENTIL HFA;VENTOLIN HFA) 108 (90 BASE) MCG/ACT inhaler Inhale 1-2 puffs into the lungs every 6 (six) hours as needed for wheezing or shortness of breath.    Marland Kitchen amLODipine (NORVASC) 5 MG tablet Take 1 tablet (5 mg total) by mouth daily. 90 tablet 1  . clonazePAM (KLONOPIN) 0.5 MG tablet Take 0.5 mg by mouth 3 (three) times daily as needed.     Marland Kitchen ibuprofen (ADVIL,MOTRIN) 200 MG tablet Take 3 tablets (600 mg total) by mouth 3 (three) times daily. 30 tablet 0  . lisinopril (PRINIVIL,ZESTRIL) 5 MG tablet Take 5 mg by mouth daily.    Marland Kitchen omeprazole (PRILOSEC) 20 MG capsule Take 20 mg by mouth daily.    . traZODone (DESYREL) 50 MG tablet Take 50-100 mg by mouth at bedtime. 1-2 tabs, as needed     No current facility-administered medications for this visit.    Allergies: Morphine and related; Fluoxetine hcl; Hydrocodone; Oxycodone; Prozac; and Tramadol    Social History: The patient  reports that she quit smoking about 12 months ago. Her smoking use included Cigarettes. She has a 10 pack-year smoking history. She has never used smokeless tobacco. She reports that she does not drink alcohol or use illicit drugs.   Family History: The patient's family history includes Cancer in her father and paternal grandmother; Colon cancer (age of onset: 26) in her father. There is no history of Stroke or Stomach cancer.    ROS: Please see the history of present illness. Otherwise, review of systems are positive for none. All other systems are reviewed and negative.    PHYSICAL EXAM: VS: BP 112/78 mmHg  Pulse 72  Ht 5\' 4"  (1.626 m)  Wt 115 lb 12.8 oz (52.527 kg)  BMI 19.87 kg/m2  SpO2 98%  LMP 10/23/2002 , BMI Body mass index is 19.87 kg/(m^2). GEN: Well nourished, well developed, in no acute distress  HEENT: normal  Neck: no JVD,  carotid bruits, or masses Cardiac: RRR; no murmurs, rubs, or gallops,no edema  Respiratory: clear to auscultation bilaterally, normal work of breathing GI: soft, nontender, nondistended, + BS MS: no deformity or atrophy  Skin: warm and dry, no rash Neuro: Strength and sensation are intact Psych: euthymic mood, full affect   EKG: EKG is not ordered today.    Recent Labs: 08/08/2015: BUN 8; Creatinine, Ser 0.64; Hemoglobin 14.3; Platelets 255; Potassium 3.9; Sodium 139    Lipid Panel  Labs (Brief)       Component Value Date/Time   CHOL 255* 04/08/2012 1125   TRIG 408* 04/08/2012 1125   HDL 43 04/08/2012 1125   CHOLHDL 5.9 04/08/2012 1125   VLDL NOT CALC 04/08/2012 1125   LDLCALC  04/08/2012 1125     Comment:      Not calculated due to Triglyceride >400. Suggest ordering Direct LDL (Unit Code: [phone]).  Total Cholesterol/HDL Ratio:CHD Risk  Coronary Heart Disease Risk Table  Men Women  1/2 Average Risk 3.4 3.3  Average Risk 5.0 4.4  2X Average Risk 9.6 7.1  3X Average Risk 23.4 11.0 Use the calculated Patient Ratio above and the CHD Risk table  to determine the patient's CHD Risk. ATP III Classification (LDL):  < 100 mg/dL Optimal  100 - 129 mg/dL Near or Above Optimal  130 - 159 mg/dL Borderline High  160 - 189 mg/dL High  > 190 mg/dL Very High    LDLDIRECT 136* 04/08/2012 1125       Wt Readings from Last 3 Encounters:  11/15/15 115 lb 12.8 oz (52.527 kg)  08/08/15 116 lb (52.617 kg)  07/20/15 116 lb (52.617 kg)        ASSESSMENT AND PLAN:  1. Palpitations: Previous Holter monitor showed rare PACs and PVCs. Symptoms have resolved  without intervention. Continue observation.  2. Hyperlipidemia: I requested fasting lipid and liver profile with a low threshold to start treatment with a statin given the previous CT scan showed evidence of coronary atherosclerosis.  3. Essential hypertension: Blood pressure is reasonably controlled. However, her dry cough is likely due to lisinopril and thus I switched her to losartan.       Disposition: FU with me in 1 year  Signed,  Kathlyn Sacramento, MD  11/15/2015 10:19 AM  [place]

## 2016-01-06 NOTE — Pre-Procedure Instructions (Signed)
Amanda Bennett  Myocardial Perfusion Imaging  Order# [id number]   Ordering physician: Deboraha Sprang, MD  Study date: 09/23/13      Patient Information    Name MRN Description   Amanda Bennett [id number] 60 year old Female         Vitals    Height Weight BMI (Calculated)   5\' 4"  (1.626 m) 119 lb (53.978 kg) 20.5    Study Highlights    [facility] [address] [place] [address] [phone]  Cardiology Nuclear Med Study  Amanda Bennett is a 60 y.o. female MRN : [id number] DOB: 03/31/1956  Procedure Date: 09/23/2013  Nuclear Med Background Indication for Stress Test: Evaluation for Ischemia History: Asthma, COPD and Long QT Syndrome Cardiac Risk Factors: Claudication, Family History - CAD, Hypertension, Lipids and Smoker  Symptoms: Chest Pain, DOE, Fatigue, Light-Headedness and Palpitations   Nuclear Pre-Procedure Caffeine/Decaff Intake: 8:00pm NPO After: 6:00am   IV Site: R Hand  IV 0.9% NS with Angio Cath: 22g  Chest Size (in): n/a IV Started by: Azucena Cecil, RN  Height: 5\' 4"  (1.626 m)  Cup Size: C--Pt has bilateral breast implants.  BMI: Body mass index is 20.42 kg/(m^2). Weight: 119 lb (53.978 kg)   Tech Comments: n/a    Nuclear Med Study 1 or 2 day study: 1 day  Stress Test Type: [place] Provider: Virl Axe, MD   Resting Radionuclide: Technetium 81m Sestamibi  Resting Radionuclide Dose: 10.5 mCi   Stress Radionuclide: Technetium 68m Sestamibi  Stress Radionuclide Dose: 31.5 mCi      Stress Protocol Rest HR: 59 Stress HR: 90  Rest BP: 140/80 Stress BP: 144/83  Exercise Time (min): n/a METS: n/a          Dose of Adenosine (mg): n/a Dose of Lexiscan: 0.4 mg  Dose of Atropine (mg): n/a Dose of Dobutamine: n/a mcg/kg/min (at max HR)  Stress Test Technologist: Mellody Memos, CCT Nuclear Technologist:  Otho Perl, CNMT   Rest Procedure: Myocardial perfusion imaging was performed at rest 45 minutes following the intravenous administration of Technetium 8m Sestamibi. Stress Procedure: The patient received IV Lexiscan 0.4 mg over 15-seconds. Technetium 50m Sestamibi injected at 30-seconds. Due to patient's shortness of breath and stomach pains, she was given IV Aminophylline 75 mg. Symptoms were resolved during recovery. There were no significant changes with Lexiscan. Quantitative spect images were obtained after a 45 minute delay.  Transient Ischemic Dilatation (Normal <1.22): 1.02 Lung/Heart Ratio (Normal <0.45): 0.28 QGS EDV: 74 ml QGS ESV: 23 ml LV Ejection Fraction: 69%  Signed by       Rest ECG: NSR - Normal EKG  Stress ECG: No significant change from baseline ECG  QPS Raw Data Images: Normal; no motion artifact; normal heart/lung ratio. Stress Images: Normal homogeneous uptake in all areas of the myocardium. Rest Images: Normal homogeneous uptake in all areas of the myocardium. Subtraction (SDS): Normal  Impression Exercise Capacity: Lexiscan with no exercise. BP Response: Normal blood pressure response. Clinical Symptoms: No significant symptoms noted. ECG Impression: No significant ST segment change suggestive of ischemia. Comparison with Prior Nuclear Study: No images to compare  Overall Impression: Normal stress nuclear study.  LV Wall Motion: NL LV Function, EF 69%; NL Wall Motion   [name] A, MD  09/23/2013 1:32 PM           Imaging    Imaging Information    Order-Level Documents -  09/23/2013:      Scan on 09/23/2013 3:28 PM by Harvin Hazel : Protocol & Pt Data SheetScan on 09/23/2013 3:28 PM by Harvin Hazel : Protocol & Pt Data Sheet     Scan on 09/23/2013 3:29 PM by Harvin Hazel : Stress Test ImagesScan on 09/23/2013 3:29 PM by Harvin Hazel : Stress Test Images     Scan on 09/23/2013 3:30 PM by Phenece  Madden : Consent FormScan on 09/23/2013 3:30 PM by Harvin Hazel : Consent Form    Encounter-Level Documents - 09/23/2013:      Scan on 09/24/2013 11:15 AM by Provider Default, [place] on 09/24/2013 11:15 AM by Provider Default, MD     Electronic signature on 09/23/2013 7:40 AM : [facility]/nl/tst     Electronic signature on 09/23/2013 7:39 AM : [facility]/nl/tst     Electronic signature on 09/23/2013 7:39 AM : [facility]/nl/tst    External Result Report    External Result Report    Order   Myocardial Perfusion Imaging [[id number]] (Order [id number])       Procedure Abnormality Status    Myocardial Perfusion Imaging      Order Providers     Authorizing Encounter Billing    Deboraha Sprang [facility] Deboraha Sprang      Original Order     Ordered On Ordered By      09/18/2013 10:43 AM Stanton Kidney, RN             Associated Diagnoses       ICD-9-CM ICD-10-CM    Palpitations    785.1 R00.2    Family history of long QT syndrome    V17.49 Z82.49    Chest pain on exertion    786.50 R07.9      Order Questions     Question Answer Comment    Where should this test be performed [facility])     Type of stress Lexiscan     Patient weight in lbs 119       Order CC Information     Recipient Phone    Deboraha Sprang, MD [phone]      Appointments for this Order     09/23/2013 8:00 AM - 15 min [facility] ([facility]) [facility]      Additional Information     Associated Librarian, academic and Order Details

## 2016-01-06 NOTE — Patient Instructions (Signed)
  Your procedure is scheduled on: 01-12-16 Report to [place] To find out your arrival time please call [phone] between 1PM - 3PM on 01-11-16  Remember: Instructions that are not followed completely may result in serious medical risk, up to and including death, or upon the discretion of your surgeon and anesthesiologist your surgery may need to be rescheduled.    _X___ 1. Do not eat food or drink liquids after midnight. No gum chewing or hard candies.     _X___ 2. No Alcohol for 24 hours before or after surgery.   ____ 3. Bring all medications with you on the day of surgery if instructed.    ____ 4. Notify your doctor if there is any change in your medical condition     (cold, fever, infections).     Do not wear jewelry, make-up, hairpins, clips or nail polish.  Do not wear lotions, powders, or perfumes. You may wear deodorant.  Do not shave 48 hours prior to surgery. Men may shave face and neck.  Do not bring valuables to the hospital.    [facility] is not responsible for any belongings or valuables.               Contacts, dentures or bridgework may not be worn into surgery.  Leave your suitcase in the car. After surgery it may be brought to your room.  For patients admitted to the hospital, discharge time is determined by your treatment team.   Patients discharged the day of surgery will not be allowed to drive home.   Please read over the following fact sheets that you were given:     _X___ Take these medicines the morning of surgery with A SIP OF WATER:    1. LOSARTAN  2. CLONAZEPAM  3.  PRILOSEC  4. TAKE AN EXTRA PRILOSEC ON Wednesday NIGHT (01-11-16) BEFORE BED  5.  6.  ____ Fleet Enema (as directed)   ____ Use CHG Soap as directed  ____ Use inhalers on the day of surgery  ____ Stop metformin 2 days prior to surgery    ____ Take 1/2 of usual insulin dose the night before surgery and none on the morning of surgery.   ____ Stop  Coumadin/Plavix/aspirin-N/A  _X___ Stop Anti-inflammatories-STOP IBUPROFEN NOW-NO NSAIDS OR ASA PRODUCTS-TYLENOL OK TO TAKE   _X___ Stop supplements until after surgery-STOP VITAMIN C AND E NOW   ____ Bring C-Pap to the hospital.

## 2016-01-10 IMAGING — MR MR SHOULDER*L* W/O CM
5 series · 40 of 40 positions shown · non-contrast
Comparison: None.

CLINICAL DATA: Left shoulder pain and limited range of motion for 3
months. No known injury. Initial encounter.

EXAM:
MRI OF THE LEFT SHOULDER WITHOUT CONTRAST
TECHNIQUE: Multiplanar, multisequence MR imaging of the shoulder was performed.
No intravenous contrast was administered.

[Series 3: T2 fat-sat · axial · 4.0mm · 0.47mm/px · z∈[-4,+84]mm · 8 of 21 slices shown (1 of 3)]
[im 1/21]
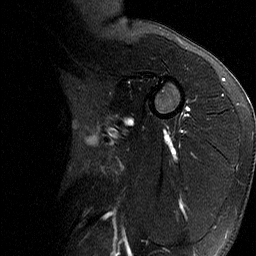
[im 3/21]
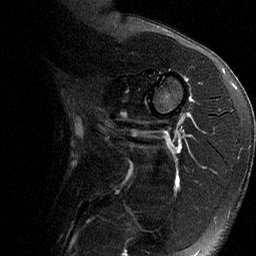
[im 6/21]
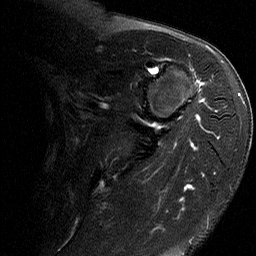
[im 9/21]
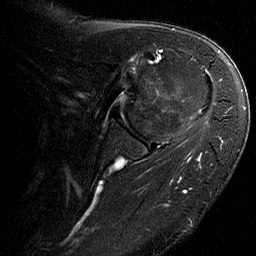
[im 12/21]
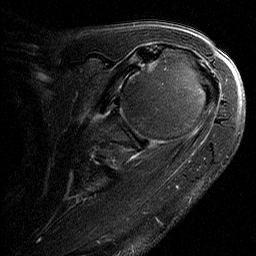
[im 15/21]
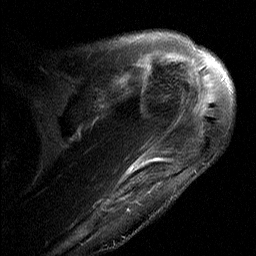
[im 18/21]
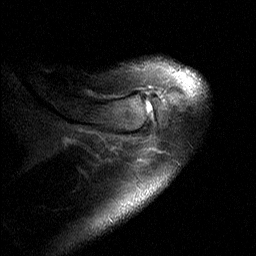
[im 21/21]
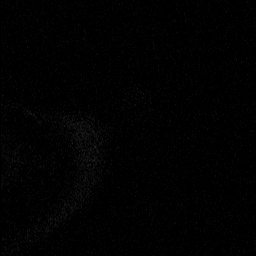

[Series 4: T2 fat-sat · oblique · 4.0mm · 0.62mm/px · 8 of 19 slices shown (2 of 3)]
[im 1/19]
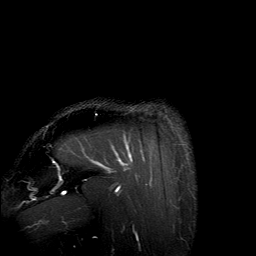
[im 3/19]
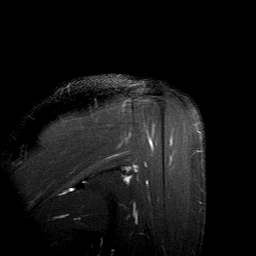
[im 6/19]
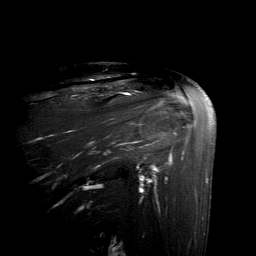
[im 8/19]
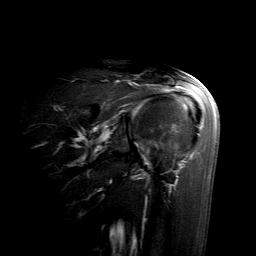
[im 11/19]
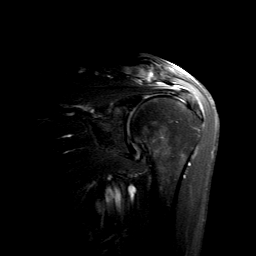
[im 13/19]
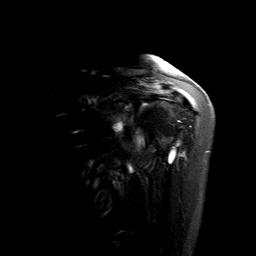
[im 16/19]
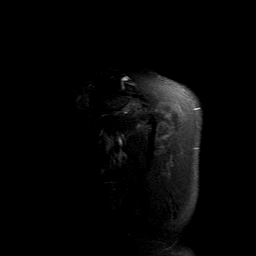
[im 19/19]
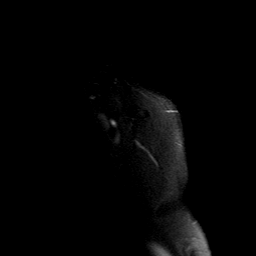

[Series 5: PD · oblique · 4.0mm · 0.62mm/px · 8 of 19 slices shown]
[im 1/19]
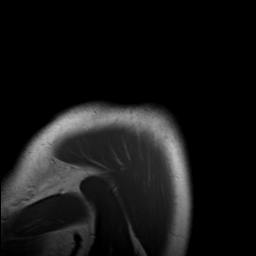
[im 3/19]
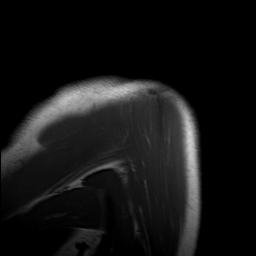
[im 6/19]
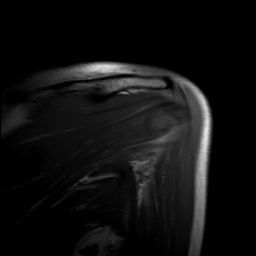
[im 8/19]
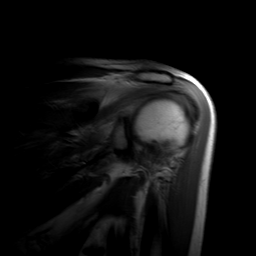
[im 11/19]
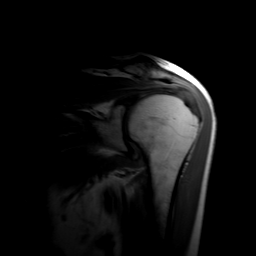
[im 13/19]
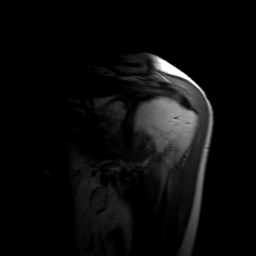
[im 16/19]
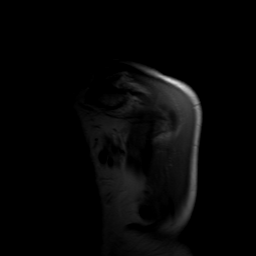
[im 19/19]
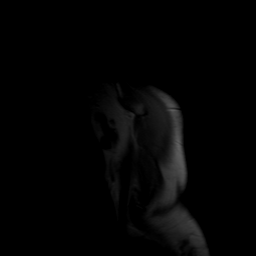

[Series 6: T1 · oblique · 4.0mm · 0.62mm/px · 8 of 19 slices shown]
[im 1/19]
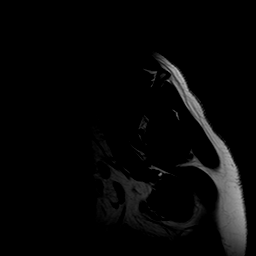
[im 3/19]
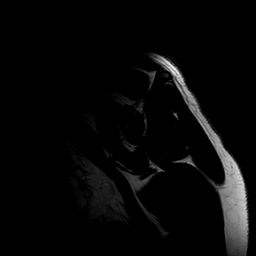
[im 6/19]
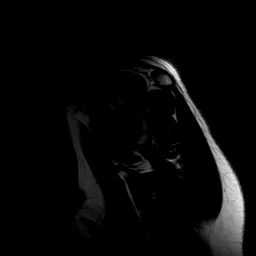
[im 8/19]
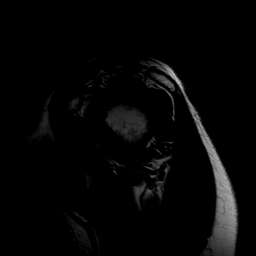
[im 11/19]
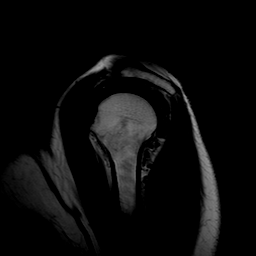
[im 13/19]
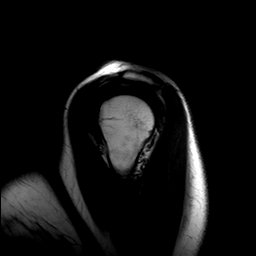
[im 16/19]
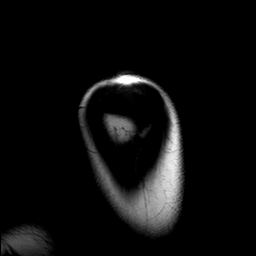
[im 19/19]
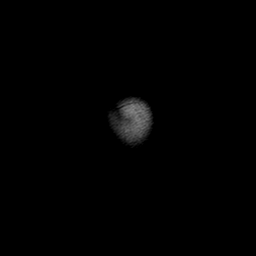

[Series 7: T2 fat-sat · oblique · 4.0mm · 0.62mm/px · 8 of 19 slices shown (3 of 3)]
[im 1/19]
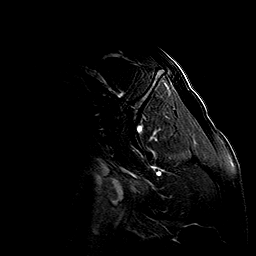
[im 3/19]
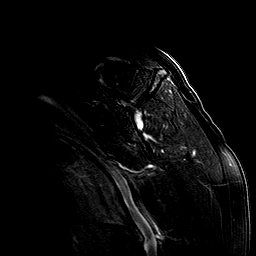
[im 6/19]
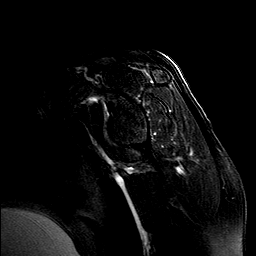
[im 8/19]
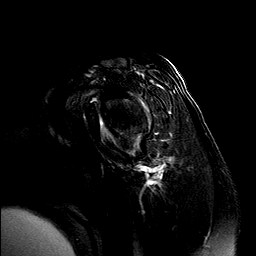
[im 11/19]
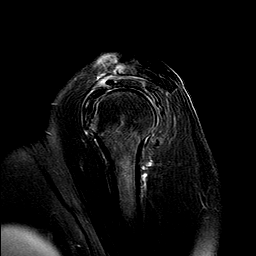
[im 13/19]
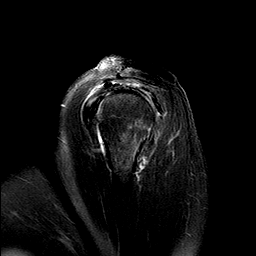
[im 16/19]
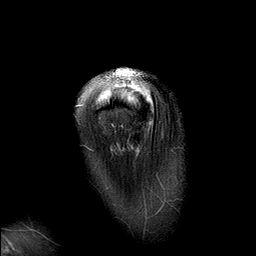
[im 19/19]
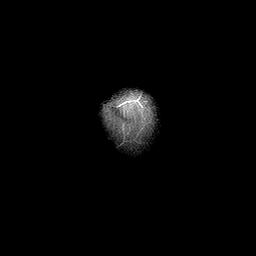

[40 of 40 positions shown; findings below may reference images not displayed]

FINDINGS: Rotator cuff: There is thickening and heterogeneously increased T2
signal in the supraspinatus, infraspinatus and subscapularis tendons
consistent with moderate to moderately severe tendinopathy. No tear
is identified.

Muscles:  Normal in appearance without atrophy or focal lesion.

Biceps long head:  Intact and normal appearance.

Acromioclavicular Joint: Moderate degenerative disease identified
with bony hypertrophy and marrow edema about the joint.

Glenohumeral Joint: Mild degenerative change is seen.

Labrum:  Intact.

Bones: No fracture or worrisome marrow lesion. The acromion is type
2. There is some fluid in the subacromial/subdeltoid bursa.
IMPRESSION: Moderate to moderately severe rotator cuff tendinopathy without
tear.

Moderate acromioclavicular osteoarthritis.

Small volume of subacromial/subdeltoid fluid compatible with
bursitis.

Mild glenohumeral degenerative change.

## 2016-01-11 ENCOUNTER — Encounter: Payer: Self-pay | Admitting: *Deleted

## 2016-01-12 ENCOUNTER — Encounter: Payer: Self-pay | Admitting: *Deleted

## 2016-01-12 ENCOUNTER — Encounter
Admission: RE | Disposition: A | Discharge status: Home / Self Care | Payer: Self-pay | Source: Ambulatory Visit | Attending: Surgery

## 2016-01-12 ENCOUNTER — Ambulatory Visit: Payer: Medicare Other | Admitting: Anesthesiology

## 2016-01-12 ENCOUNTER — Ambulatory Visit
Admission: RE | Admit: 2016-01-12 | Discharge: 2016-01-12 | Disposition: A | Discharge status: Home / Self Care | Payer: Medicare Other | Source: Ambulatory Visit | Attending: Surgery | Admitting: Surgery

## 2016-01-12 DIAGNOSIS — Z9889 Other specified postprocedural states: Secondary | ICD-10-CM | POA: Diagnosis not present

## 2016-01-12 DIAGNOSIS — Z90710 Acquired absence of both cervix and uterus: Secondary | ICD-10-CM | POA: Insufficient documentation

## 2016-01-12 DIAGNOSIS — Z791 Long term (current) use of non-steroidal anti-inflammatories (NSAID): Secondary | ICD-10-CM | POA: Insufficient documentation

## 2016-01-12 DIAGNOSIS — Z87891 Personal history of nicotine dependence: Secondary | ICD-10-CM | POA: Diagnosis not present

## 2016-01-12 DIAGNOSIS — F419 Anxiety disorder, unspecified: Secondary | ICD-10-CM | POA: Diagnosis not present

## 2016-01-12 DIAGNOSIS — M19012 Primary osteoarthritis, left shoulder: Secondary | ICD-10-CM | POA: Diagnosis not present

## 2016-01-12 DIAGNOSIS — G629 Polyneuropathy, unspecified: Secondary | ICD-10-CM | POA: Insufficient documentation

## 2016-01-12 DIAGNOSIS — Z79899 Other long term (current) drug therapy: Secondary | ICD-10-CM | POA: Insufficient documentation

## 2016-01-12 DIAGNOSIS — K219 Gastro-esophageal reflux disease without esophagitis: Secondary | ICD-10-CM | POA: Insufficient documentation

## 2016-01-12 DIAGNOSIS — G8929 Other chronic pain: Secondary | ICD-10-CM | POA: Insufficient documentation

## 2016-01-12 DIAGNOSIS — Z888 Allergy status to other drugs, medicaments and biological substances status: Secondary | ICD-10-CM | POA: Insufficient documentation

## 2016-01-12 DIAGNOSIS — M810 Age-related osteoporosis without current pathological fracture: Secondary | ICD-10-CM | POA: Diagnosis not present

## 2016-01-12 DIAGNOSIS — Z8042 Family history of malignant neoplasm of prostate: Secondary | ICD-10-CM | POA: Diagnosis not present

## 2016-01-12 DIAGNOSIS — Z885 Allergy status to narcotic agent status: Secondary | ICD-10-CM | POA: Diagnosis not present

## 2016-01-12 DIAGNOSIS — M1990 Unspecified osteoarthritis, unspecified site: Secondary | ICD-10-CM | POA: Diagnosis not present

## 2016-01-12 DIAGNOSIS — G8918 Other acute postprocedural pain: Secondary | ICD-10-CM | POA: Diagnosis not present

## 2016-01-12 DIAGNOSIS — I10 Essential (primary) hypertension: Secondary | ICD-10-CM | POA: Insufficient documentation

## 2016-01-12 DIAGNOSIS — Z79891 Long term (current) use of opiate analgesic: Secondary | ICD-10-CM | POA: Diagnosis not present

## 2016-01-12 DIAGNOSIS — M65812 Other synovitis and tenosynovitis, left shoulder: Secondary | ICD-10-CM | POA: Insufficient documentation

## 2016-01-12 DIAGNOSIS — M549 Dorsalgia, unspecified: Secondary | ICD-10-CM | POA: Insufficient documentation

## 2016-01-12 DIAGNOSIS — M7582 Other shoulder lesions, left shoulder: Secondary | ICD-10-CM | POA: Diagnosis not present

## 2016-01-12 DIAGNOSIS — M25512 Pain in left shoulder: Secondary | ICD-10-CM | POA: Diagnosis not present

## 2016-01-12 HISTORY — DX: Chronic obstructive pulmonary disease, unspecified: J44.9

## 2016-01-12 HISTORY — DX: Family history of other specified conditions: Z84.89

## 2016-01-12 HISTORY — PX: SHOULDER ARTHROSCOPY: SHX128

## 2016-01-12 SURGERY — ARTHROSCOPY, SHOULDER
Anesthesia: Regional | Laterality: Left | Wound class: Clean

## 2016-01-12 MED ORDER — GLYCOPYRROLATE 0.2 MG/ML IJ SOLN
INTRAMUSCULAR | Status: DC | PRN
  Administered 2016-01-12: 0.4 mg via INTRAVENOUS

## 2016-01-12 MED ORDER — ONDANSETRON HCL 4 MG/2ML IJ SOLN
4.0000 mg | Freq: Once | INTRAMUSCULAR | Status: AC | PRN
  Administered 2016-01-12: 4 mg via INTRAVENOUS

## 2016-01-12 MED ORDER — PHENYLEPHRINE HCL 10 MG/ML IJ SOLN
INTRAMUSCULAR | Status: DC | PRN
  Administered 2016-01-12: 200 ug via INTRAVENOUS
  Administered 2016-01-12: 100 ug via INTRAVENOUS
  Administered 2016-01-12: 300 ug via INTRAVENOUS

## 2016-01-12 MED ORDER — MIDAZOLAM HCL 2 MG/2ML IJ SOLN
INTRAMUSCULAR | Status: DC | PRN
  Administered 2016-01-12: 2 mg via INTRAVENOUS

## 2016-01-12 MED ORDER — FENTANYL CITRATE (PF) 100 MCG/2ML IJ SOLN
INTRAMUSCULAR | Status: AC
  Administered 2016-01-12: 50 ug via INTRAVENOUS
  Filled 2016-01-12: qty 2

## 2016-01-12 MED ORDER — PROPOFOL 10 MG/ML IV BOLUS
INTRAVENOUS | Status: DC | PRN
  Administered 2016-01-12: 130 mg via INTRAVENOUS
  Administered 2016-01-12: 50 mg via INTRAVENOUS

## 2016-01-12 MED ORDER — IPRATROPIUM-ALBUTEROL 0.5-2.5 (3) MG/3ML IN SOLN
3.0000 mL | Freq: Once | RESPIRATORY_TRACT | Status: AC
  Administered 2016-01-12: 3 mL via RESPIRATORY_TRACT

## 2016-01-12 MED ORDER — FENTANYL CITRATE (PF) 100 MCG/2ML IJ SOLN
50.0000 ug | Freq: Once | INTRAMUSCULAR | Status: AC
  Administered 2016-01-12: 50 ug via INTRAVENOUS

## 2016-01-12 MED ORDER — METOCLOPRAMIDE HCL 5 MG/ML IJ SOLN: 5.0000 mg | Freq: Three times a day (TID) | INTRAMUSCULAR | Status: DC | PRN

## 2016-01-12 MED ORDER — OXYCODONE HCL 5 MG PO TABS
5.0000 mg | ORAL_TABLET | ORAL | Status: DC | PRN
  Administered 2016-01-12: 10 mg via ORAL

## 2016-01-12 MED ORDER — IPRATROPIUM-ALBUTEROL 0.5-2.5 (3) MG/3ML IN SOLN
RESPIRATORY_TRACT | Status: AC
  Administered 2016-01-12: 3 mL via RESPIRATORY_TRACT
  Filled 2016-01-12: qty 3

## 2016-01-12 MED ORDER — LIDOCAINE HCL (PF) 4 % IJ SOLN
INTRAMUSCULAR | Status: DC | PRN
  Administered 2016-01-12: 4 mL via RESPIRATORY_TRACT

## 2016-01-12 MED ORDER — NEOSTIGMINE METHYLSULFATE 10 MG/10ML IV SOLN
INTRAVENOUS | Status: DC | PRN
Start: 2016-01-12 — End: 2016-01-12
  Administered 2016-01-12: 3 mg via INTRAVENOUS

## 2016-01-12 MED ORDER — BUPIVACAINE-EPINEPHRINE (PF) 0.5% -1:200000 IJ SOLN
INTRAMUSCULAR | Status: DC | PRN
  Administered 2016-01-12: 20 mL via PERINEURAL

## 2016-01-12 MED ORDER — LIDOCAINE HCL (PF) 1 % IJ SOLN
INTRAMUSCULAR | Status: AC
  Administered 2016-01-12: .5 mL
  Filled 2016-01-12: qty 5

## 2016-01-12 MED ORDER — EPINEPHRINE HCL 1 MG/ML IJ SOLN
INTRAMUSCULAR | Status: DC | PRN
  Administered 2016-01-12: 2 mg

## 2016-01-12 MED ORDER — ROCURONIUM BROMIDE 100 MG/10ML IV SOLN
INTRAVENOUS | Status: DC | PRN
  Administered 2016-01-12: 50 mg via INTRAVENOUS

## 2016-01-12 MED ORDER — CEFAZOLIN SODIUM-DEXTROSE 2-4 GM/100ML-% IV SOLN
2.0000 g | Freq: Once | INTRAVENOUS | Status: AC
  Administered 2016-01-12: 2 g via INTRAVENOUS

## 2016-01-12 MED ORDER — BUPIVACAINE-EPINEPHRINE (PF) 0.5% -1:200000 IJ SOLN
INTRAMUSCULAR | Status: AC
  Filled 2016-01-12: qty 30

## 2016-01-12 MED ORDER — LACTATED RINGERS IV SOLN
INTRAVENOUS | Status: DC
  Administered 2016-01-12 (×2): via INTRAVENOUS

## 2016-01-12 MED ORDER — DEXTROSE 5 % IV SOLN
10.0000 mg | INTRAVENOUS | Status: DC | PRN
  Administered 2016-01-12: 30 ug/min via INTRAVENOUS

## 2016-01-12 MED ORDER — ONDANSETRON HCL 4 MG/2ML IJ SOLN
INTRAMUSCULAR | Status: AC
  Administered 2016-01-12: 4 mg via INTRAVENOUS
  Filled 2016-01-12: qty 2

## 2016-01-12 MED ORDER — EPINEPHRINE HCL 1 MG/ML IJ SOLN
INTRAMUSCULAR | Status: AC
  Filled 2016-01-12: qty 1

## 2016-01-12 MED ORDER — FENTANYL CITRATE (PF) 100 MCG/2ML IJ SOLN
25.0000 ug | INTRAMUSCULAR | Status: DC | PRN
  Administered 2016-01-12 (×4): 25 ug via INTRAVENOUS

## 2016-01-12 MED ORDER — METOCLOPRAMIDE HCL 10 MG PO TABS: 5.0000 mg | ORAL_TABLET | Freq: Three times a day (TID) | ORAL | Status: DC | PRN

## 2016-01-12 MED ORDER — MIDAZOLAM HCL 5 MG/5ML IJ SOLN
INTRAMUSCULAR | Status: AC
Start: 2016-01-12 — End: 2016-01-12
  Administered 2016-01-12: 1 mg via INTRAVENOUS
  Filled 2016-01-12: qty 5

## 2016-01-12 MED ORDER — CEFAZOLIN SODIUM-DEXTROSE 2-4 GM/100ML-% IV SOLN
INTRAVENOUS | Status: AC
  Filled 2016-01-12: qty 100

## 2016-01-12 MED ORDER — POTASSIUM CHLORIDE IN NACL 20-0.9 MEQ/L-% IV SOLN: INTRAVENOUS | Status: DC

## 2016-01-12 MED ORDER — OXYCODONE HCL 5 MG PO TABS
ORAL_TABLET | ORAL | Status: AC
  Administered 2016-01-12: 10 mg via ORAL
  Filled 2016-01-12: qty 2

## 2016-01-12 MED ORDER — LIDOCAINE HCL (CARDIAC) 20 MG/ML IV SOLN
INTRAVENOUS | Status: DC | PRN
  Administered 2016-01-12: 60 mg via INTRAVENOUS
  Administered 2016-01-12: 40 mg via INTRAVENOUS

## 2016-01-12 MED ORDER — ROPIVACAINE HCL 5 MG/ML IJ SOLN
INTRAMUSCULAR | Status: AC
  Administered 2016-01-12: 30 mL via EPIDURAL
  Filled 2016-01-12: qty 40

## 2016-01-12 MED ORDER — OXYCODONE HCL 5 MG PO TABS: 5.0000 mg | ORAL_TABLET | ORAL | Status: DC | PRN

## 2016-01-12 MED ORDER — ONDANSETRON HCL 4 MG/2ML IJ SOLN: 4.0000 mg | Freq: Four times a day (QID) | INTRAMUSCULAR | Status: DC | PRN

## 2016-01-12 MED ORDER — FENTANYL CITRATE (PF) 100 MCG/2ML IJ SOLN
INTRAMUSCULAR | Status: DC | PRN
  Administered 2016-01-12: 250 ug via INTRAVENOUS

## 2016-01-12 MED ORDER — ONDANSETRON HCL 4 MG PO TABS: 4.0000 mg | ORAL_TABLET | Freq: Four times a day (QID) | ORAL | Status: DC | PRN

## 2016-01-12 MED ORDER — MIDAZOLAM HCL 5 MG/5ML IJ SOLN
1.0000 mg | Freq: Once | INTRAMUSCULAR | Status: AC
  Administered 2016-01-12: 1 mg via INTRAVENOUS

## 2016-01-12 MED ORDER — FENTANYL CITRATE (PF) 100 MCG/2ML IJ SOLN
INTRAMUSCULAR | Status: AC
  Administered 2016-01-12: 25 ug via INTRAVENOUS
  Filled 2016-01-12: qty 2

## 2016-01-12 SURGICAL SUPPLY — 53 items
BIT DRILL JUGRKNT W/NDL BIT2.9 (DRILL) IMPLANT
BLADE FULL RADIUS 3.5 (BLADE) IMPLANT
BLADE OSC/SAGITTAL MD 9X18.5 (BLADE) ×2 IMPLANT
BLADE SHAVER 4.5X7 STR FR (MISCELLANEOUS) ×2 IMPLANT
BUR ACROMIONIZER 4.0 (BURR) IMPLANT
BUR BR 5.5 WIDE MOUTH (BURR) IMPLANT
CANNULA 8.5X75 THRED (CANNULA) ×2 IMPLANT
CANNULA SHAVER 8MMX76MM (CANNULA) ×2 IMPLANT
CHLORAPREP W/TINT 26ML (MISCELLANEOUS) ×2 IMPLANT
DRAPE IMP U-DRAPE 54X76 (DRAPES) ×4 IMPLANT
DRAPE SURG 17X11 SM STRL (DRAPES) ×2 IMPLANT
DRILL JUGGERKNOT W/NDL BIT 2.9 (DRILL)
DRSG OPSITE POSTOP 4X8 (GAUZE/BANDAGES/DRESSINGS) ×2 IMPLANT
ELECT REM PT RETURN 9FT ADLT (ELECTROSURGICAL) ×2
ELECTRODE REM PT RTRN 9FT ADLT (ELECTROSURGICAL) ×1 IMPLANT
GAUZE PETRO XEROFOAM 1X8 (MISCELLANEOUS) ×2 IMPLANT
GAUZE SPONGE 4X4 12PLY STRL (GAUZE/BANDAGES/DRESSINGS) ×2 IMPLANT
GAUZE XEROFORM 4X4 STRL (GAUZE/BANDAGES/DRESSINGS) IMPLANT
GLOVE BIO SURGEON STRL SZ7.5 (GLOVE) ×4 IMPLANT
GLOVE BIO SURGEON STRL SZ8 (GLOVE) ×4 IMPLANT
GLOVE BIOGEL PI IND STRL 8 (GLOVE) ×1 IMPLANT
GLOVE BIOGEL PI INDICATOR 8 (GLOVE) ×1
GLOVE INDICATOR 8.0 STRL GRN (GLOVE) ×2 IMPLANT
GOWN STRL REUS W/ TWL LRG LVL3 (GOWN DISPOSABLE) ×2 IMPLANT
GOWN STRL REUS W/ TWL XL LVL3 (GOWN DISPOSABLE) ×1 IMPLANT
GOWN STRL REUS W/TWL LRG LVL3 (GOWN DISPOSABLE) ×2
GOWN STRL REUS W/TWL XL LVL3 (GOWN DISPOSABLE) ×1
GRASPER SUT 15 45D LOW PRO (SUTURE) IMPLANT
IV LACTATED RINGER IRRG 3000ML (IV SOLUTION) ×2
IV LR IRRIG 3000ML ARTHROMATIC (IV SOLUTION) ×2 IMPLANT
MANIFOLD NEPTUNE II (INSTRUMENTS) ×2 IMPLANT
MASK FACE SPIDER DISP (MASK) ×2 IMPLANT
MAT BLUE FLOOR 46X72 FLO (MISCELLANEOUS) ×2 IMPLANT
NDL MAYO CATGUT SZ4 (NEEDLE) IMPLANT
NEEDLE HYPO 22GX1.5 SAFETY (NEEDLE) ×2 IMPLANT
NEEDLE MAYO 6 CRC TAPER PT (NEEDLE) IMPLANT
NEEDLE MAYO CATGUT SZ 1.5 (NEEDLE)
NEEDLE MAYO CATGUT SZ 2 (NEEDLE) IMPLANT
NEEDLE REVERSE CUT 1/2 CRC (NEEDLE) IMPLANT
PACK ARTHROSCOPY SHOULDER (MISCELLANEOUS) ×2 IMPLANT
SLING ARM LRG DEEP (SOFTGOODS) IMPLANT
SLING ARM S TX990203 (SOFTGOODS) ×2 IMPLANT
SLING ULTRA II LG (MISCELLANEOUS) ×2 IMPLANT
STAPLER SKIN PROX 35W (STAPLE) ×2 IMPLANT
STRAP SAFETY BODY (MISCELLANEOUS) ×2 IMPLANT
SUT ETHIBOND 0 MO6 C/R (SUTURE) IMPLANT
SUT PROLENE 4 0 PS 2 18 (SUTURE) ×2 IMPLANT
SUT VIC AB 2-0 CT1 27 (SUTURE)
SUT VIC AB 2-0 CT1 TAPERPNT 27 (SUTURE) IMPLANT
TAPE MICROFOAM 4IN (TAPE) ×2 IMPLANT
TUBING ARTHRO INFLOW-ONLY STRL (TUBING) ×2 IMPLANT
TUBING CONNECTING 10 (TUBING) ×2 IMPLANT
WAND HAND CNTRL MULTIVAC 90 (MISCELLANEOUS) ×2 IMPLANT

## 2016-01-12 NOTE — Discharge Instructions (Addendum)
Keep dressing dry and intact.  May shower after dressing changed on post-op day #4 (Monday).  Cover staples with Band-Aids after drying off. Apply ice frequently to shoulder. Use sling for first 3-4 days regularly, then as needed for comfort. Follow-up in 10-14 days or as scheduled.  AMBULATORY SURGERY  DISCHARGE INSTRUCTIONS   1) The drugs that you were given will stay in your system until tomorrow so for the next 24 hours you should not:  A) Drive an automobile B) Make any legal decisions C) Drink any alcoholic beverage   2) You may resume regular meals tomorrow.  Today it is better to start with liquids and gradually work up to solid foods.  You may eat anything you prefer, but it is better to start with liquids, then soup and crackers, and gradually work up to solid foods.   3) Please notify your doctor immediately if you have any unusual bleeding, trouble breathing, redness and pain at the surgery site, drainage, fever, or pain not relieved by medication.    4) Additional Instructions:        Please contact your physician with any problems or Same Day Surgery at [phone], Monday through Friday 6 am to 4 pm, or [place] at [facility] number at [phone].

## 2016-01-12 NOTE — Anesthesia Postprocedure Evaluation (Signed)
Anesthesia Post Note  Patient: Amanda Bennett  Procedure(s) Performed: Procedure(s) (LRB): ARTHROSCOPY SHOULDER WITH DEBRIDEMENT AND EXCISION OF THE DISTAL CLAVICLE (Left)  Patient location during evaluation: PACU Anesthesia Type: General Level of consciousness: awake and alert Pain management: pain level controlled Vital Signs Assessment: post-procedure vital signs reviewed and stable Respiratory status: spontaneous breathing and respiratory function stable Cardiovascular status: stable Anesthetic complications: no    Last Vitals:  Filed Vitals:   01/12/16 1200 01/12/16 1220  BP: 109/55 111/77  Pulse: 65 75  Temp: 36.5 C 36.6 C  Resp: 23 16    Last Pain:  Filed Vitals:   01/12/16 1252  PainSc: 7                  KEPHART,WILLIAM K

## 2016-01-12 NOTE — OR Nursing (Signed)
Left arm in sling, left hand warm cap refill < 3secs, radial pulse strong and regular

## 2016-01-12 NOTE — Transfer of Care (Signed)
Immediate Anesthesia Transfer of Care Note  Patient: Amanda Bennett  Procedure(s) Performed: Procedure(s): ARTHROSCOPY SHOULDER WITH DEBRIDEMENT AND EXCISION OF THE DISTAL CLAVICLE (Left)  Patient Location: PACU  Anesthesia Type:General  Level of Consciousness: awake, alert , oriented and patient cooperative  Airway & Oxygen Therapy: Patient Spontanous Breathing and Patient connected to nasal cannula oxygen  Post-op Assessment: Report given to RN and Post -op Vital signs reviewed and stable  Post vital signs: Reviewed and stable  Last Vitals:  Filed Vitals:   01/12/16 0930 01/12/16 1113  BP: 111/62 123/96  Pulse: 82 71  Temp:  36.7 C  Resp: 16 11    Last Pain:  Filed Vitals:   01/12/16 1113  PainSc: 0-No pain         Complications: No apparent anesthesia complications

## 2016-01-12 NOTE — H&P (Signed)
Paper H&P to be scanned into permanent record. H&P reviewed. No changes. 

## 2016-01-12 NOTE — Anesthesia Preprocedure Evaluation (Signed)
Anesthesia Evaluation  Patient identified by MRN, date of birth, ID band  Reviewed: Allergy & Precautions, NPO status , Patient's Chart, lab work & pertinent test results  History of Anesthesia Complications (+) PONV, Family history of anesthesia reaction and history of anesthetic complications (mother with heart problems)  Airway Mallampati: I       Dental   Pulmonary shortness of breath, asthma , COPD,  COPD inhaler, former smoker,           Cardiovascular hypertension, Pt. on medications + dysrhythmias (prolonged QT)      Neuro/Psych Anxiety Depression    GI/Hepatic negative GI ROS, GERD  Medicated and Poorly Controlled,(+) Hepatitis -, C  Endo/Other  negative endocrine ROS  Renal/GU negative Renal ROS     Musculoskeletal   Abdominal   Peds  Hematology   Anesthesia Other Findings   Reproductive/Obstetrics                             Anesthesia Physical Anesthesia Plan  ASA: III  Anesthesia Plan: General and Regional   Post-op Pain Management:  Regional for Post-op pain   Induction: Intravenous  Airway Management Planned: Oral ETT  Additional Equipment:   Intra-op Plan:   Post-operative Plan:   Informed Consent: I have reviewed the patients History and Physical, chart, labs and discussed the procedure including the risks, benefits and alternatives for the proposed anesthesia with the patient or authorized representative who has indicated his/her understanding and acceptance.     Plan Discussed with:   Anesthesia Plan Comments:         Anesthesia Quick Evaluation

## 2016-01-12 NOTE — Anesthesia Procedure Notes (Addendum)
Anesthesia Regional Block:  Interscalene brachial plexus block  Pre-Anesthetic Checklist: ,, timeout performed, Correct Patient, Correct Site, Correct Laterality, Correct Procedure, Correct Position, site marked, Risks and benefits discussed,  Surgical consent,  Pre-op evaluation,  At surgeon's request and post-op pain management   Prep: alcohol swabs       Needles:  Injection technique: Single-shot  Needle Type: Stimiplex     Needle Length: 5cm 5 cm Needle Gauge: 22 and 22 G    Additional Needles:  Procedures: ultrasound guided (picture in chart) and nerve stimulator Interscalene brachial plexus block  Nerve Stimulator or Paresthesia:  Response: biceps flexion,   Additional Responses:   Narrative:  Start time: 01/12/2016 9:12 AM End time: 01/12/2016 9:19 AM Injection made incrementally with aspirations every 5 mL.  Performed by: Personally   Additional Notes: Functioning IV was confirmed and monitors were applied.  A 41mm 22ga Stimuplex needle was used. Sterile prep and drape,hand hygiene and sterile gloves were used.  Negative aspiration and negative test dose prior to incremental administration of local anesthetic. The patient tolerated the procedure well.      Procedure Name: Intubation Date/Time: 01/12/2016 9:54 AM Performed by: Rosaria Ferries, Patsy Zaragoza Pre-anesthesia Checklist: Patient identified, Emergency Drugs available, Suction available and Patient being monitored Patient Re-evaluated:Patient Re-evaluated prior to inductionOxygen Delivery Method: Circle system utilized Preoxygenation: Pre-oxygenation with 100% oxygen Intubation Type: IV induction Laryngoscope Size: Mac and 3 Tube type: Oral Tube size: 7.0 mm Number of attempts: 1 Placement Confirmation: ETT inserted through vocal cords under direct vision,  positive ETCO2 and breath sounds checked- equal and bilateral Secured at: 21 cm Tube secured with: Tape Dental Injury: Teeth and Oropharynx as per pre-operative  assessment

## 2016-01-12 NOTE — Op Note (Signed)
01/12/2016  11:02 AM  Patient:   Amanda Bennett  Pre-Op Diagnosis:   Degenerative joint disease A-C joint, left shoulder.  Postoperative diagnosis: Same with labral fraying, left shoulder.  Procedure: Limited arthroscopic debridement and mini-open distal clavicle excision, left shoulder.  Anesthesia: General endotracheal with interscalene block placed preoperatively by the anesthesiologist.  Surgeon:   Pascal Lux, MD  Assistant:   None  Findings: As above. There were grade 2-3 chondromalacial changes involving the central portion of the glenoid. There also was some labral fraying superiorly, as well as some synovitis anteriorly and superiorly. The labrum itself remained attached to the glenoid circumferentially. The rotator cuff was in excellent condition, as was the biceps tendon.  Complications: None  Fluids:   5 cc  Estimated blood loss: 1100 cc  Tourniquet time: None  Drains: None  Closure: Staples   Brief clinical note: The patient is is a 60 year old female who is now 6 months status post and arthroscopic debridement was subacromial decompression of her left shoulder. The patient had done well for several months before her shoulder pain returned. The patient's symptoms have progressed despite medications, activity modification, etc. The patient's history and examination are consistent witdegenerative joint disease of the acromioclavicular joint. The patient presents at this time forarthroscopic debridement and excision of the distal clavicle.   Procedure: The patient underwent placement of an interscalene block by the anesthesiologist in the preoperative holding area before she was brought into the operating room and lain in the supine position . The patient then underwent general endotracheal intubation and anesthesia before being repositioned in the beach chair position using the beach chair positioner. The left shoulder and upper extremity  were prepped with ChloraPrep solution before being draped sterilely. Preoperative antibiotics were administered. A timeout was performed to confirm the proper surgical site before the expected portal sites and incision site were injected with 0.5% Sensorcaine with epinephrine. A posterior portal was created and the glenohumeral joint thoroughly inspected with the findings as described above. An anterior portal was created using an outside-in technique. The labrum and rotator cuff were further probed, again confirming the above-noted findings. The areas of labral fraying and synovitis were debrided using the full-radius resector. The ArthroCare wand was inserted and used to obtain hemostasis as well as to "anneal" the labrum superiorly. The instruments were removed from the joint after suctioning the excess fluid.  The camera was repositioned through the posterior portal into the subacromial space. A separate lateral portal was created using an outside-in technique. The 3.5 mm full-radius resector was introduced and used to perform a subtotal bursectomy. The ArthroCare wand was then inserted and used to remove the periosteal tissue off the undersurface of the anterior third of the acromion as well as to recess the coracoacromial ligament from its attachment along the anterior and lateral margins of the acromion. Dissection was carried over to the A-C joint. The inferior capsule of the A-C joint was noted to be absent.  additional soft tissues were debrided from around the inferior aspect of the distal clavicle. In addition, the remnants of the demi-meniscus also were excised. The instruments were then removed from the subacromial space after suctioning the excess fluid.  An approximately 2-3 cm incision was made over the superior aspect of the A-C joint. This incision was carried down through the subcutaneous tissues to expose the clavipectoral fascia. This was split parallel to the clavicle to expose the A-C  joint. Soft tissue dissection was carried out subperiosteally around  the distal clavicle and additional degenerative capsule and demi-meniscus remnants were excised. The lateral-most 8-10 mm of distal clavicle were removed using a micro-oscillating saw.   The wound was copiously irrigated with sterile saline solution before a small amount of bone wax was placed over the exposed bone of the distal clavicle excision site. The clavipectoral fascia was reapproximated using 2-0 Vicryl interrupted sutures. The subcutaneous tissues were closed using 2-0 Vicryl interrupted sutures before the skin was closed using staples. The portal sites also were closed using staples. A sterile bulky dressing was applied to the shoulder before the arm was placed into a shoulder sling. The patient was then awakened, extubated, and returned to the recovery room in satisfactory condition after tolerating the procedure well.

## 2016-01-12 NOTE — OR Nursing (Signed)
Pain improving patient desires to go home

## 2016-01-12 NOTE — OR Nursing (Signed)
Patient taken to PACU for block. Report to [facility].

## 2016-01-13 ENCOUNTER — Encounter: Payer: Self-pay | Admitting: Surgery

## 2016-01-17 DIAGNOSIS — Z9889 Other specified postprocedural states: Secondary | ICD-10-CM | POA: Diagnosis not present

## 2016-02-06 ENCOUNTER — Encounter (HOSPITAL_COMMUNITY): Payer: Self-pay | Admitting: Emergency Medicine

## 2016-02-06 ENCOUNTER — Emergency Department (HOSPITAL_COMMUNITY)
Admission: EM | Admit: 2016-02-06 | Discharge: 2016-02-06 | Disposition: A | Discharge status: Home / Self Care | Payer: Medicare Other | Attending: Emergency Medicine | Admitting: Emergency Medicine

## 2016-02-06 DIAGNOSIS — G894 Chronic pain syndrome: Secondary | ICD-10-CM | POA: Insufficient documentation

## 2016-02-06 DIAGNOSIS — I10 Essential (primary) hypertension: Secondary | ICD-10-CM | POA: Insufficient documentation

## 2016-02-06 DIAGNOSIS — S30861A Insect bite (nonvenomous) of abdominal wall, initial encounter: Secondary | ICD-10-CM | POA: Diagnosis not present

## 2016-02-06 DIAGNOSIS — Y9389 Activity, other specified: Secondary | ICD-10-CM | POA: Diagnosis not present

## 2016-02-06 DIAGNOSIS — Y998 Other external cause status: Secondary | ICD-10-CM | POA: Insufficient documentation

## 2016-02-06 DIAGNOSIS — R21 Rash and other nonspecific skin eruption: Secondary | ICD-10-CM | POA: Insufficient documentation

## 2016-02-06 DIAGNOSIS — Y9289 Other specified places as the place of occurrence of the external cause: Secondary | ICD-10-CM | POA: Diagnosis not present

## 2016-02-06 DIAGNOSIS — M79606 Pain in leg, unspecified: Secondary | ICD-10-CM | POA: Diagnosis not present

## 2016-02-06 DIAGNOSIS — J449 Chronic obstructive pulmonary disease, unspecified: Secondary | ICD-10-CM | POA: Insufficient documentation

## 2016-02-06 DIAGNOSIS — M549 Dorsalgia, unspecified: Secondary | ICD-10-CM | POA: Diagnosis not present

## 2016-02-06 DIAGNOSIS — W57XXXA Bitten or stung by nonvenomous insect and other nonvenomous arthropods, initial encounter: Secondary | ICD-10-CM | POA: Diagnosis not present

## 2016-02-06 MED ORDER — AZITHROMYCIN 250 MG PO TABS: 1000.0000 mg | ORAL_TABLET | Freq: Once | ORAL | Status: DC

## 2016-02-06 MED ORDER — CEFTRIAXONE SODIUM 250 MG IJ SOLR: 250.0000 mg | INTRAMUSCULAR | Status: DC

## 2016-02-06 NOTE — ED Notes (Signed)
PT left before being seen.

## 2016-02-06 NOTE — ED Notes (Signed)
Pt c/o bit by a tick on her right groin area 2 weeks ago. Ever since pt has had back pain, groin pain, leg pain/cramping, and intermittent areas of whelps appearing on her body in different areas.

## 2016-02-06 NOTE — ED Notes (Signed)
PT absent from room ,unable to locate PT . Pt DC.

## 2016-02-07 DIAGNOSIS — D559 Anemia due to enzyme disorder, unspecified: Secondary | ICD-10-CM | POA: Diagnosis not present

## 2016-02-07 DIAGNOSIS — M791 Myalgia: Secondary | ICD-10-CM | POA: Diagnosis not present

## 2016-02-07 DIAGNOSIS — B189 Chronic viral hepatitis, unspecified: Secondary | ICD-10-CM | POA: Diagnosis not present

## 2016-02-07 DIAGNOSIS — I10 Essential (primary) hypertension: Secondary | ICD-10-CM | POA: Diagnosis not present

## 2016-02-07 DIAGNOSIS — Z1329 Encounter for screening for other suspected endocrine disorder: Secondary | ICD-10-CM | POA: Diagnosis not present

## 2016-02-07 DIAGNOSIS — L501 Idiopathic urticaria: Secondary | ICD-10-CM | POA: Diagnosis not present

## 2016-02-10 ENCOUNTER — Other Ambulatory Visit: Payer: Self-pay | Admitting: Adult Health

## 2016-02-10 DIAGNOSIS — R208 Other disturbances of skin sensation: Secondary | ICD-10-CM

## 2016-02-10 DIAGNOSIS — R52 Pain, unspecified: Secondary | ICD-10-CM

## 2016-02-10 DIAGNOSIS — R0989 Other specified symptoms and signs involving the circulatory and respiratory systems: Secondary | ICD-10-CM

## 2016-02-17 ENCOUNTER — Ambulatory Visit
Admission: RE | Admit: 2016-02-17 | Discharge: 2016-02-17 | Disposition: A | Discharge status: Home / Self Care | Payer: Medicare Other | Source: Ambulatory Visit | Attending: Adult Health | Admitting: Adult Health

## 2016-02-17 DIAGNOSIS — R0989 Other specified symptoms and signs involving the circulatory and respiratory systems: Secondary | ICD-10-CM

## 2016-02-17 DIAGNOSIS — R208 Other disturbances of skin sensation: Secondary | ICD-10-CM

## 2016-02-17 DIAGNOSIS — R52 Pain, unspecified: Secondary | ICD-10-CM

## 2016-02-20 ENCOUNTER — Other Ambulatory Visit: Payer: Medicare Other

## 2016-09-10 HISTORY — PX: CARPAL TUNNEL RELEASE: SHX101

## 2016-10-17 ENCOUNTER — Encounter: Payer: Self-pay | Admitting: Internal Medicine

## 2016-11-05 ENCOUNTER — Encounter (HOSPITAL_COMMUNITY): Payer: Self-pay | Admitting: Emergency Medicine

## 2016-11-05 ENCOUNTER — Ambulatory Visit (HOSPITAL_COMMUNITY)
Admission: EM | Admit: 2016-11-05 | Discharge: 2016-11-05 | Disposition: A | Discharge status: Home / Self Care | Payer: Medicare HMO | Attending: Family Medicine | Admitting: Family Medicine

## 2016-11-05 DIAGNOSIS — H6982 Other specified disorders of Eustachian tube, left ear: Secondary | ICD-10-CM | POA: Diagnosis not present

## 2016-11-05 DIAGNOSIS — J069 Acute upper respiratory infection, unspecified: Secondary | ICD-10-CM | POA: Diagnosis not present

## 2016-11-05 DIAGNOSIS — R0982 Postnasal drip: Secondary | ICD-10-CM

## 2016-11-05 DIAGNOSIS — T700XXA Otitic barotrauma, initial encounter: Secondary | ICD-10-CM | POA: Diagnosis not present

## 2016-11-05 DIAGNOSIS — J9801 Acute bronchospasm: Secondary | ICD-10-CM | POA: Diagnosis not present

## 2016-11-05 MED ORDER — PREDNISONE 50 MG PO TABS: ORAL_TABLET | ORAL | 0 refills | Status: DC

## 2016-11-05 MED ORDER — CHLORPHENIRAMINE MALEATE 2 MG/5ML PO SYRP: ORAL_SOLUTION | ORAL | 0 refills | Status: DC

## 2016-11-05 NOTE — ED Provider Notes (Signed)
CSN: [id number]     Arrival date & time 11/05/16  1001 History   First MD Initiated Contact with Patient 11/05/16 1034     Chief Complaint  Patient presents with  . Sore Throat  . Shortness of Breath  . Cough  . Otalgia    left   (Consider location/radiation/quality/duration/timing/severity/associated sxs/prior Treatment) 61 year old females complaining of left-sided sore throat for 2 weeks. Also complaining of subjective swelling to the left side of the lower face and neck. Complains of left earache, shortness of breath (history of asthma), much PND. Denies fever. She is using her albuterol HFA 3 times a day. She clears her throat frequently. She states her cough is worse at night when supine and so is the drainage in the back of her throat. She has a history of COPD she denies smoking.      Past Medical History:  Diagnosis Date  . Anxiety   . Arthritis pain   . Asthma   . Bruises easily   . Cancer (HCC)    BASAL CELL  . Carpal tunnel syndrome   . Cervical spinal stenosis 2008   per x-ray findings 2008  . Cervical syndrome   . Cervicalgia   . Chronic pain syndrome   . COPD (chronic obstructive pulmonary disease) ([place])   . Cough   . Depression   . Dysrhythmia    prolonged QT  . Family history of adverse reaction to anesthesia    PTS MOM GETS SICK  . Foot fracture, right 07/2010   per x-ray 07/2010 - oblique comminuted fifth metatarsal fracture, followed by Dr. Lorelei Pont  . GERD (gastroesophageal reflux disease)   . Headache    MIGRAINES  . Hepatitis    hx of Hep C -PT STATES SHE RECEIVED TREATMENT AND NO LONGER HAS HEP C  . Hypertension   . Insomnia   . Lesion of ulnar nerve   . Lumbago   . Osteoporosis   . Pain in limb   . PONV (postoperative nausea and vomiting)    shortness of breath, nausea  . Primary localized osteoarthrosis, pelvic region and thigh   . Pulmonary nodule, right    RUL nodule, 7 mm noted on Chest XRAY - 06/2008, per CT follow up in 2009 - No  suspicious pulmonary nodules or masse noted  . Shortness of breath dyspnea    ONLY WHEN WAKING UP IN AM-PT USES ALBUTEROL INHALER AND THAT SEEMS TO HELP  . Unexplained weight loss   . Visual disturbance   . Wears contact lenses   . Wears glasses   . Wound disruption, post-op, skin    Past Surgical History:  Procedure Laterality Date  . ABDOMINAL HYSTERECTOMY    . broken clavical    . CARPAL TUNNEL RELEASE     right and left  . CHOLECYSTECTOMY    . CHOLECYSTECTOMY  10/30/2011   Procedure: LAPAROSCOPIC CHOLECYSTECTOMY WITH INTRAOPERATIVE CHOLANGIOGRAM;  Surgeon: Imogene Burn. Georgette Dover, MD;  Location: [facility];  Service: General;  Laterality: N/A;  . COLONOSCOPY    . DORSAL COMPARTMENT RELEASE Left 07/20/2015   Procedure: RELEASE DORSAL COMPARTMENT (DEQUERVAIN);  Surgeon: Corky Mull, MD;  Location: [place];  Service: Orthopedics;  Laterality: Left;  . drain in left ring finger     cat bite  . finger amputated     right ring  . HEMORRHOID SURGERY  11/25/2006  . INGUINAL LYMPH NODE BIOPSY Right 02/03/2014   Procedure: INGUINAL LYMPH NODE BIOPSY;  Surgeon: Merri Ray  Grandville Silos, MD;  Location: [place];  Service: General;  Laterality: Right;  right inguinal area  . SHOULDER ARTHROSCOPY Left 07/20/2015   Procedure: ARTHROSCOPY SHOULDER DEBRIDEMENT AND DECOMPRESSION;  Surgeon: Corky Mull, MD;  Location: [place];  Service: Orthopedics;  Laterality: Left;  . SHOULDER ARTHROSCOPY Left 01/12/2016   Procedure: ARTHROSCOPY SHOULDER WITH DEBRIDEMENT AND EXCISION OF THE DISTAL CLAVICLE;  Surgeon: Corky Mull, MD;  Location: [facility];  Service: Orthopedics;  Laterality: Left;   Family History  Problem Relation Age of Onset  . Stroke Neg Hx   . Stomach cancer Neg Hx   . Cancer Father     colon/prostate  . Colon cancer Father 75    deceased at age 68 from colon cancer  . Cancer Paternal Grandmother     ovarian   Social History  Substance Use Topics  . Smoking  status: Former Smoker    Packs/day: 0.50    Years: 20.00    Types: Cigarettes    Quit date: 10/24/2014  . Smokeless tobacco: Never Used  . Alcohol use 0.0 oz/week     Comment: OCC BEER   OB History    No data available     Review of Systems  Constitutional: Positive for activity change. Negative for appetite change, chills, fatigue and fever.  HENT: Positive for congestion, postnasal drip, rhinorrhea and sore throat. Negative for facial swelling.   Eyes: Negative.   Respiratory: Positive for cough and shortness of breath.   Cardiovascular: Negative.   Gastrointestinal: Negative.   Musculoskeletal: Negative for neck pain and neck stiffness.  Skin: Negative for pallor and rash.  Neurological: Negative.     Allergies  Morphine and related; Fluoxetine hcl; Hydrocodone; Lisinopril; Prozac [fluoxetine hcl]; Tramadol; and Tylenol [acetaminophen]  Home Medications   Prior to Admission medications   Medication Sig Start Date End Date Taking? Authorizing Provider  albuterol (PROVENTIL HFA;VENTOLIN HFA) 108 (90 BASE) MCG/ACT inhaler Inhale 1-2 puffs into the lungs every 6 (six) hours as needed for wheezing or shortness of breath.   Yes Historical Provider, MD  ascorbic acid (VITAMIN C) 500 MG tablet Take 500 mg by mouth daily.   Yes Historical Provider, MD  atorvastatin (LIPITOR) 40 MG tablet Take 1 tablet (40 mg total) by mouth daily. Patient taking differently: Take 40 mg by mouth daily at 6 PM.  11/22/15  Yes Wellington Hampshire, MD  cholecalciferol (VITAMIN D) 1000 units tablet Take 1,000 Units by mouth daily.   Yes Historical Provider, MD  clonazePAM (KLONOPIN) 0.5 MG tablet Take 0.5 mg by mouth 3 (three) times daily as needed.  11/08/15  Yes Historical Provider, MD  ibuprofen (ADVIL,MOTRIN) 200 MG tablet Take 3 tablets (600 mg total) by mouth 3 (three) times daily. 07/20/15  Yes Corky Mull, MD  losartan (COZAAR) 25 MG tablet Take 1 tablet (25 mg total) by mouth daily. Patient taking  differently: Take 25 mg by mouth every morning.  11/15/15  Yes Wellington Hampshire, MD  omeprazole (PRILOSEC) 20 MG capsule Take 20 mg by mouth every morning.    Yes Historical Provider, MD  traZODone (DESYREL) 50 MG tablet Take 100 mg by mouth at bedtime.    Yes Historical Provider, MD  vitamin E (VITAMIN E) 400 UNIT capsule Take 400 Units by mouth daily.   Yes Historical Provider, MD  chlorpheniramine (CHLOR-TRIMETON) 2 MG/5ML syrup Take 5 mm (2 mg) every 4-6 hours as needed for drainage in her throat. May cause drowsiness.  11/05/16   Janne Napoleon, NP  oxyCODONE (ROXICODONE) 5 MG immediate release tablet Take 1-2 tablets (5-10 mg total) by mouth every 4 (four) hours as needed for severe pain. 01/12/16   Corky Mull, MD  predniSONE (DELTASONE) 50 MG tablet 1 tab po daily for 6 days. Take with food. 11/05/16   Janne Napoleon, NP   Meds Ordered and Administered this Visit  Medications - No data to display  BP 139/86 (BP Location: Right Arm)   Pulse 87   Temp 98.6 F (37 C) (Oral)   Resp 16   LMP 10/23/2002   SpO2 100%  No data found.   Physical Exam  Constitutional: She is oriented to person, place, and time. She appears well-developed and well-nourished. No distress.  HENT:  Head: Normocephalic and atraumatic.  Bilateral TMs are retracted left greater than right. No erythema or bulging. There is tenderness to the soft tissue just posterior to the angle of the left jaw. No palpable nodes.  Oropharynx with erythema and moderate frothy clear PND. No exudates or swelling.  Eyes: EOM are normal.  Neck: Normal range of motion. Neck supple.  Cardiovascular: Normal rate, regular rhythm and normal heart sounds.   Pulmonary/Chest: Effort normal and breath sounds normal. No respiratory distress.  With tidal volume lungs are clear. With forced cough there is faint distant coarseness bilaterally.  Musculoskeletal: Normal range of motion. She exhibits no edema.  Lymphadenopathy:    She has no cervical  adenopathy.  Neurological: She is alert and oriented to person, place, and time.  Skin: Skin is warm and dry. No rash noted.  Psychiatric: She has a normal mood and affect.  Nursing note and vitals reviewed.   Urgent Care Course     Procedures (including critical care time)  Labs Review Labs Reviewed - No data to display  Imaging Review No results found.   Visual Acuity Review  Right Eye Distance:   Left Eye Distance:   Bilateral Distance:    Right Eye Near:   Left Eye Near:    Bilateral Near:         MDM   1. Cough due to bronchospasm   2. PND (post-nasal drip)   3. Upper respiratory tract infection, unspecified type   4. ETD (Eustachian tube dysfunction), left   5. Barotitis media, initial encounter    Your cough is due to a combination of drainage in the back of your throat from your sinuses that collects particularly at night when lying on your back and makes you cough and choke and some bronchospasm. The sure to use her albuterol inhaler 2 puffs every 4 hours as needed. You are receiving a medicine will also help with drainage and cough and congestion. Sure to take this medicine only as directed and do not take more than his directed. Drink plenty of fluids and stay well-hydrated keep the back of your throat clear and clean with cool water. Meds ordered this encounter  Medications  . predniSONE (DELTASONE) 50 MG tablet    Sig: 1 tab po daily for 6 days. Take with food.    Dispense:  6 tablet    Refill:  0    Order Specific Question:   Supervising Provider    Answer:   Billy Fischer [id number]  . chlorpheniramine (CHLOR-TRIMETON) 2 MG/5ML syrup    Sig: Take 5 mm (2 mg) every 4-6 hours as needed for drainage in her throat. May cause drowsiness.    Dispense:  118  mL    Refill:  0    Order Specific Question:   Supervising Provider    Answer:   Billy Fischer [id number]       Janne Napoleon, NP 11/05/16 1054

## 2016-11-05 NOTE — Discharge Instructions (Signed)
Your cough is due to a combination of drainage in the back of your throat from your sinuses that collects particularly at night when lying on your back and makes you cough and choke and some bronchospasm. The sure to use her albuterol inhaler 2 puffs every 4 hours as needed. You are receiving a medicine will also help with drainage and cough and congestion. Sure to take this medicine only as directed and do not take more than his directed. Drink plenty of fluids and stay well-hydrated keep the back of your throat clear and clean with cool water.

## 2016-11-05 NOTE — ED Triage Notes (Signed)
Pt has been suffering from a cough, SOB, sore throat and left ear pain for two weeks.  She was taking amoxicillin 500mg  from a prescription she had left over at home for 6 days twice a day.

## 2016-11-20 ENCOUNTER — Encounter: Payer: Self-pay | Admitting: Cardiovascular Disease

## 2016-11-20 ENCOUNTER — Ambulatory Visit (INDEPENDENT_AMBULATORY_CARE_PROVIDER_SITE_OTHER): Payer: Medicare HMO | Admitting: Cardiovascular Disease

## 2016-11-20 VITALS — BP 122/78 | HR 76 | Ht 64.0 in | Wt 124.0 lb

## 2016-11-20 DIAGNOSIS — I10 Essential (primary) hypertension: Secondary | ICD-10-CM | POA: Diagnosis not present

## 2016-11-20 DIAGNOSIS — E785 Hyperlipidemia, unspecified: Secondary | ICD-10-CM

## 2016-11-20 DIAGNOSIS — R002 Palpitations: Secondary | ICD-10-CM

## 2016-11-20 LAB — HEPATIC FUNCTION PANEL
ALT: 11 U/L (ref 6–29)
AST: 18 U/L (ref 10–35)
Albumin: 4.3 g/dL (ref 3.6–5.1)
Alkaline Phosphatase: 73 U/L (ref 33–130)
Bilirubin, Direct: 0 mg/dL (ref <=0.2)
Indirect Bilirubin: 0.4 mg/dL (ref 0.2–1.2)
Total Bilirubin: 0.4 mg/dL (ref 0.2–1.2)
Total Protein: 7 g/dL (ref 6.1–8.1)

## 2016-11-20 LAB — BASIC METABOLIC PANEL
BUN: 15 mg/dL (ref 7–25)
CO2: 24 mmol/L (ref 20–31)
Calcium: 9.4 mg/dL (ref 8.6–10.4)
Chloride: 101 mmol/L (ref 98–110)
Creat: 0.64 mg/dL (ref 0.50–1.05)
Glucose, Bld: 86 mg/dL (ref 65–99)
Potassium: 4.3 mmol/L (ref 3.5–5.3)
Sodium: 137 mmol/L (ref 135–146)

## 2016-11-20 LAB — LIPID PANEL
Cholesterol: 281 mg/dL — ABNORMAL HIGH (ref <200)
HDL: 60 mg/dL (ref >50)
LDL Cholesterol: 174 mg/dL — ABNORMAL HIGH (ref <100)
Total CHOL/HDL Ratio: 4.7 Ratio (ref <5.0)
Triglycerides: 234 mg/dL — ABNORMAL HIGH (ref <150)
VLDL: 47 mg/dL — ABNORMAL HIGH (ref <30)

## 2016-11-20 NOTE — Patient Instructions (Addendum)
Medication Instructions:  Your physician recommends that you continue on your current medications as directed. Please refer to the Current Medication list given to you today.  Labwork: Your physician recommends that you have lab work today: LIPID, LIVER and BMP  Testing/Procedures: No new orders.   Follow-Up: Your physician wants you to follow-up in: 1 YEAR with Dr Fletcher Anon.  You will receive a reminder letter in the mail two months in advance. If you don't receive a letter, please call our office to schedule the follow-up appointment.   Any Other Special Instructions Will Be Listed Below (If Applicable).  Please review information on [employer] monitor.     If you need a refill on your cardiac medications before your next appointment, please call your pharmacy.

## 2016-11-20 NOTE — Progress Notes (Signed)
Cardiology Office Note   Date:  11/20/2016   ID:  Amanda Bennett, DOB Feb 23, 1956, MRN [id number]  PCP:  Alvester Chou, NP  Cardiologist:   Kathlyn Sacramento, MD   Chief Complaint  Patient presents with  . Follow-up      History of Present Illness: Amanda Bennett is a 61 y.o. female who presents for a follow up visit regarding hypertension, hyperlipidemia and previously prolonged QT interval.  She has history of previous tobacco use, hypertension, anxiety and hepatitis C. she quit smoking in 2016. She has family history of prolonged QT syndrome and she did have mildly prolonged QT on EKG in the setting of treatment with medications that can cause that. QT went back to normal after stopping the medications. She was seen by Dr. Caryl Comes and no further workup was recommended. Nuclear stress test in 2015 was normal. Previous CT scan did show evidence of coronary atherosclerosis. Holter monitor in 2016 showed normal sinus rhythm with rare PACs and PVCs. During last visit, she was switched from lisinopril to losartan due to dry cough. She has been doing reasonably well overall with no chest pain or shortness of breath. She complains of occasional palpitations and tachycardia especially at night. These episodes typically last 15-30 seconds. No syncope. She was started on atorvastatin for hyperlipidemia. She has been taking the medication but reports myalgia.  Past Medical History:  Diagnosis Date  . Anxiety   . Arthritis pain   . Asthma   . Bruises easily   . Cancer (HCC)    BASAL CELL  . Carpal tunnel syndrome   . Cervical spinal stenosis 2008   per x-ray findings 2008  . Cervical syndrome   . Cervicalgia   . Chronic pain syndrome   . COPD (chronic obstructive pulmonary disease) ([place])   . Cough   . Depression   . Dysrhythmia    prolonged QT  . Family history of adverse reaction to anesthesia    PTS MOM GETS SICK  . Foot fracture, right 07/2010   per x-ray 07/2010 - oblique  comminuted fifth metatarsal fracture, followed by Dr. Lorelei Pont  . GERD (gastroesophageal reflux disease)   . Headache    MIGRAINES  . Hepatitis    hx of Hep C -PT STATES SHE RECEIVED TREATMENT AND NO LONGER HAS HEP C  . Hypertension   . Insomnia   . Lesion of ulnar nerve   . Lumbago   . Osteoporosis   . Pain in limb   . PONV (postoperative nausea and vomiting)    shortness of breath, nausea  . Primary localized osteoarthrosis, pelvic region and thigh   . Pulmonary nodule, right    RUL nodule, 7 mm noted on Chest XRAY - 06/2008, per CT follow up in 2009 - No suspicious pulmonary nodules or masse noted  . Shortness of breath dyspnea    ONLY WHEN WAKING UP IN AM-PT USES ALBUTEROL INHALER AND THAT SEEMS TO HELP  . Unexplained weight loss   . Visual disturbance   . Wears contact lenses   . Wears glasses   . Wound disruption, post-op, skin     Past Surgical History:  Procedure Laterality Date  . ABDOMINAL HYSTERECTOMY    . broken clavical    . CARPAL TUNNEL RELEASE     right and left  . CHOLECYSTECTOMY    . CHOLECYSTECTOMY  10/30/2011   Procedure: LAPAROSCOPIC CHOLECYSTECTOMY WITH INTRAOPERATIVE CHOLANGIOGRAM;  Surgeon: Imogene Burn. Tsuei, MD;  Location: [facility];  Service: General;  Laterality: N/A;  . COLONOSCOPY    . DORSAL COMPARTMENT RELEASE Left 07/20/2015   Procedure: RELEASE DORSAL COMPARTMENT (DEQUERVAIN);  Surgeon: Corky Mull, MD;  Location: [facility];  Service: Orthopedics;  Laterality: Left;  . drain in left ring finger     cat bite  . finger amputated     right ring  . HEMORRHOID SURGERY  11/25/2006  . INGUINAL LYMPH NODE BIOPSY Right 02/03/2014   Procedure: INGUINAL LYMPH NODE BIOPSY;  Surgeon: Zenovia Jarred, MD;  Location: [facility];  Service: General;  Laterality: Right;  right inguinal area  . SHOULDER ARTHROSCOPY Left 07/20/2015   Procedure: ARTHROSCOPY SHOULDER DEBRIDEMENT AND DECOMPRESSION;  Surgeon: Corky Mull, MD;  Location: [facility];  Service: Orthopedics;  Laterality: Left;  . SHOULDER ARTHROSCOPY Left 01/12/2016   Procedure: ARTHROSCOPY SHOULDER WITH DEBRIDEMENT AND EXCISION OF THE DISTAL CLAVICLE;  Surgeon: Corky Mull, MD;  Location: [facility];  Service: Orthopedics;  Laterality: Left;     Current Outpatient Prescriptions  Medication Sig Dispense Refill  . albuterol (PROVENTIL HFA;VENTOLIN HFA) 108 (90 BASE) MCG/ACT inhaler Inhale 1-2 puffs into the lungs every 6 (six) hours as needed for wheezing or shortness of breath.    Marland Kitchen ascorbic acid (VITAMIN C) 500 MG tablet Take 500 mg by mouth daily.    Marland Kitchen atorvastatin (LIPITOR) 40 MG tablet Take 1 tablet (40 mg total) by mouth daily. (Patient taking differently: Take 40 mg by mouth daily at 6 PM. ) 30 tablet 3  . cholecalciferol (VITAMIN D) 1000 units tablet Take 1,000 Units by mouth daily.    . clonazePAM (KLONOPIN) 0.5 MG tablet Take 0.5 mg by mouth 3 (three) times daily as needed.     . Coenzyme Q10 (CO Q 10 PO) Take 30 mg by mouth daily.    Marland Kitchen ibuprofen (ADVIL,MOTRIN) 200 MG tablet Take 3 tablets (600 mg total) by mouth 3 (three) times daily. 30 tablet 0  . losartan (COZAAR) 25 MG tablet Take 1 tablet (25 mg total) by mouth daily. (Patient taking differently: Take 25 mg by mouth every morning. ) 90 tablet 3  . Omega-3 1000 MG CAPS Take 1,000 mg by mouth daily.    Marland Kitchen omeprazole (PRILOSEC) 20 MG capsule Take 20 mg by mouth every morning.     Marland Kitchen oxyCODONE (ROXICODONE) 5 MG immediate release tablet Take 1-2 tablets (5-10 mg total) by mouth every 4 (four) hours as needed for severe pain. 50 tablet 0  . traZODone (DESYREL) 50 MG tablet Take 100 mg by mouth at bedtime.     . vitamin E (VITAMIN E) 400 UNIT capsule Take 400 Units by mouth daily.     No current facility-administered medications for this visit.     Allergies:   Morphine and related; Fluoxetine hcl; Hydrocodone; Lisinopril; Prozac [fluoxetine hcl]; Tramadol; and Tylenol [acetaminophen]    Social History:   The patient  reports that she quit smoking about 2 years ago. Her smoking use included Cigarettes. She has a 10.00 pack-year smoking history. She has never used smokeless tobacco. She reports that she drinks alcohol. She reports that she does not use drugs.   Family History:  The patient's family history includes Cancer in her father and paternal grandmother; Colon cancer (age of onset: 69) in her father.    ROS:  Please see the history of present illness.   Otherwise, review of systems are positive for none.   All other systems are  reviewed and negative.    PHYSICAL EXAM: VS:  BP 122/78   Pulse 76   Ht 5\' 4"  (1.626 m)   Wt 124 lb (56.2 kg)   LMP 10/23/2002   BMI 21.28 kg/m  , BMI Body mass index is 21.28 kg/m. GEN: Well nourished, well developed, in no acute distress  HEENT: normal  Neck: no JVD, carotid bruits, or masses Cardiac: RRR; no murmurs, rubs, or gallops,no edema  Respiratory:  clear to auscultation bilaterally, normal work of breathing GI: soft, nontender, nondistended, + BS MS: no deformity or atrophy  Skin: warm and dry, no rash Neuro:  Strength and sensation are intact Psych: euthymic mood, full affect   EKG:  EKG is ordered today. EKG showed normal sinus rhythm with no significant ST or T wave changes.    Recent Labs: No results found for requested labs within last 8760 hours.    Lipid Panel    Component Value Date/Time   CHOL 234 (H) 11/17/2015 0742   TRIG 131 11/17/2015 0742   HDL 55 11/17/2015 0742   CHOLHDL 4.3 11/17/2015 0742   VLDL 26 11/17/2015 0742   LDLCALC 153 (H) 11/17/2015 0742   LDLDIRECT 136 (H) 04/08/2012 1125      Wt Readings from Last 3 Encounters:  11/20/16 124 lb (56.2 kg)  02/06/16 108 lb 4 oz (49.1 kg)  01/06/16 115 lb (52.2 kg)        ASSESSMENT AND PLAN:  1.  Palpitations: Previous Holter monitor showed rare PACs and PVCs.  She describes palpitations at night but her symptoms are not frequent enough to be captured  with a Holter monitor. I suggested that she gets a Museum/gallery curator.  2. Hyperlipidemia:  Given that the previous CT scan showed evidence of coronary atherosclerosis, I recommend a target LDL of less than 100 and possibly less than 70. She is taking atorvastatin but reports myalgia. I requested lipid and liver profile and based on the results I will consider decreasing the dose or switch him to rosuvastatin.  3. Essential hypertension: Blood pressure is controlled on current medications. I requested basic metabolic profile.    Disposition:   FU with me in 1 year  Signed,  Kathlyn Sacramento, MD  11/20/2016 11:06 AM    [place]

## 2016-11-26 ENCOUNTER — Telehealth: Payer: Self-pay | Admitting: Cardiovascular Disease

## 2016-11-26 NOTE — Telephone Encounter (Signed)
Pt would like her lab results from 11-20-16 please.

## 2016-11-27 ENCOUNTER — Telehealth: Payer: Self-pay | Admitting: Pediatrics

## 2016-11-27 DIAGNOSIS — E785 Hyperlipidemia, unspecified: Secondary | ICD-10-CM

## 2016-11-27 MED ORDER — ROSUVASTATIN CALCIUM 20 MG PO TABS: 20.0000 mg | ORAL_TABLET | Freq: Every day | ORAL | 3 refills | Status: DC

## 2016-11-27 NOTE — Telephone Encounter (Signed)
-----   Message from Wellington Hampshire, MD sent at 11/25/2016  6:24 PM EDT ----- Inform patient that labs were normal. Cholesterol was terrible. Switch Atorvastatin to Rosuvastatin 20 mg daily. Repeat labs in 2 months. Send a copy to PCP.

## 2016-11-27 NOTE — Telephone Encounter (Signed)
Pt called again today,still have not received lab results. Lauren,Dr Fletcher Anon nurse is in [facility] all week.

## 2016-11-27 NOTE — Telephone Encounter (Signed)
I spoke to patient and advised of medication change and lab appt.  02/05/17-fasting, pt voiced understanding and agreed with plan. See Result note.

## 2016-11-27 NOTE — Telephone Encounter (Signed)
See result note. I spoke with patient this am.

## 2016-12-19 ENCOUNTER — Encounter: Payer: Self-pay | Admitting: Internal Medicine

## 2016-12-24 ENCOUNTER — Other Ambulatory Visit: Payer: Self-pay | Admitting: Adult Health

## 2016-12-24 DIAGNOSIS — Z1231 Encounter for screening mammogram for malignant neoplasm of breast: Secondary | ICD-10-CM

## 2016-12-25 ENCOUNTER — Ambulatory Visit
Admission: RE | Admit: 2016-12-25 | Discharge: 2016-12-25 | Disposition: A | Discharge status: Home / Self Care | Payer: Medicare HMO | Source: Ambulatory Visit | Attending: Adult Health | Admitting: Adult Health

## 2016-12-25 DIAGNOSIS — Z1231 Encounter for screening mammogram for malignant neoplasm of breast: Secondary | ICD-10-CM

## 2017-01-04 ENCOUNTER — Telehealth: Payer: Self-pay | Admitting: Cardiovascular Disease

## 2017-01-04 ENCOUNTER — Encounter (HOSPITAL_COMMUNITY): Payer: Self-pay | Admitting: Emergency Medicine

## 2017-01-04 ENCOUNTER — Emergency Department (HOSPITAL_COMMUNITY)
Admission: EM | Admit: 2017-01-04 | Discharge: 2017-01-04 | Disposition: A | Discharge status: Home / Self Care | Payer: Medicare HMO | Attending: Emergency Medicine | Admitting: Emergency Medicine

## 2017-01-04 ENCOUNTER — Emergency Department (HOSPITAL_COMMUNITY): Payer: Medicare HMO

## 2017-01-04 DIAGNOSIS — Z87891 Personal history of nicotine dependence: Secondary | ICD-10-CM | POA: Insufficient documentation

## 2017-01-04 DIAGNOSIS — Z79899 Other long term (current) drug therapy: Secondary | ICD-10-CM | POA: Diagnosis not present

## 2017-01-04 DIAGNOSIS — J449 Chronic obstructive pulmonary disease, unspecified: Secondary | ICD-10-CM | POA: Insufficient documentation

## 2017-01-04 DIAGNOSIS — G459 Transient cerebral ischemic attack, unspecified: Secondary | ICD-10-CM | POA: Diagnosis not present

## 2017-01-04 DIAGNOSIS — R42 Dizziness and giddiness: Secondary | ICD-10-CM | POA: Diagnosis present

## 2017-01-04 DIAGNOSIS — R011 Cardiac murmur, unspecified: Secondary | ICD-10-CM

## 2017-01-04 DIAGNOSIS — Z85828 Personal history of other malignant neoplasm of skin: Secondary | ICD-10-CM | POA: Diagnosis not present

## 2017-01-04 DIAGNOSIS — M791 Myalgia, unspecified site: Secondary | ICD-10-CM

## 2017-01-04 DIAGNOSIS — I10 Essential (primary) hypertension: Secondary | ICD-10-CM | POA: Insufficient documentation

## 2017-01-04 LAB — CBC WITH DIFFERENTIAL/PLATELET
Basophils Absolute: 0 10*3/uL (ref 0.0–0.1)
Basophils Relative: 0 %
Eosinophils Absolute: 0.1 10*3/uL (ref 0.0–0.7)
Eosinophils Relative: 1 %
HCT: 38.8 % (ref 36.0–46.0)
Hemoglobin: 13.2 g/dL (ref 12.0–15.0)
Lymphocytes Relative: 16 %
Lymphs Abs: 1.4 10*3/uL (ref 0.7–4.0)
MCH: 32.4 pg (ref 26.0–34.0)
MCHC: 34 g/dL (ref 30.0–36.0)
MCV: 95.3 fL (ref 78.0–100.0)
Monocytes Absolute: 0.6 10*3/uL (ref 0.1–1.0)
Monocytes Relative: 7 %
Neutro Abs: 6.9 10*3/uL (ref 1.7–7.7)
Neutrophils Relative %: 76 %
Platelets: 239 10*3/uL (ref 150–400)
RBC: 4.07 MIL/uL (ref 3.87–5.11)
RDW: 13.7 % (ref 11.5–15.5)
WBC: 9 10*3/uL (ref 4.0–10.5)

## 2017-01-04 LAB — COMPREHENSIVE METABOLIC PANEL
ALT: 16 U/L (ref 14–54)
AST: 24 U/L (ref 15–41)
Albumin: 4.5 g/dL (ref 3.5–5.0)
Alkaline Phosphatase: 71 U/L (ref 38–126)
Anion gap: 10 (ref 5–15)
BUN: 10 mg/dL (ref 6–20)
CO2: 24 mmol/L (ref 22–32)
Calcium: 9.7 mg/dL (ref 8.9–10.3)
Chloride: 106 mmol/L (ref 101–111)
Creatinine, Ser: 0.61 mg/dL (ref 0.44–1.00)
GFR calc Af Amer: >60 mL/min (ref >60)
GFR calc non Af Amer: >60 mL/min (ref >60)
Glucose, Bld: 99 mg/dL (ref 65–99)
Potassium: 3.8 mmol/L (ref 3.5–5.1)
Sodium: 140 mmol/L (ref 135–145)
Total Bilirubin: 0.4 mg/dL (ref 0.3–1.2)
Total Protein: 6.9 g/dL (ref 6.5–8.1)

## 2017-01-04 LAB — CK: Total CK: 58 U/L (ref 38–234)

## 2017-01-04 LAB — I-STAT TROPONIN, ED: Troponin i, poc: 0 ng/mL (ref 0.00–0.08)

## 2017-01-04 MED ORDER — LOSARTAN POTASSIUM 50 MG PO TABS: 50.0000 mg | ORAL_TABLET | Freq: Every day | ORAL | 0 refills | Status: DC

## 2017-01-04 MED ORDER — ASPIRIN 81 MG PO CHEW
324.0000 mg | CHEWABLE_TABLET | Freq: Once | ORAL | Status: AC
  Administered 2017-01-04: 324 mg via ORAL
  Filled 2017-01-04: qty 4

## 2017-01-04 NOTE — ED Provider Notes (Signed)
[facility] DEPT Provider Note   CSN: [id number] Arrival date & time: 01/04/17  1024     History   Chief Complaint Chief Complaint  Patient presents with  . Chest Pain  . Dizziness    HPI Amanda Bennett is a 61 y.o. female. Facial numbness, leg pain, strange feelings and chest  HPI  Presents with several complaints. She has high blood pressure. She saw her primary care physician a few weeks ago after her triglycerides were high. She had been started on Lipitor. This was changed eo crestor 2/2 the myalgias.   She is on cozaar 25mg  daily. Her BP readings are consistently high at home, and she often takes 50mg . For the last 7 days, she has had intermittent numbness in the left upper lip. No facial droop or drooling. No UE or LE weakness.  History of prolonged QT and sees Dr. Caryl Comes.  Had continued moderate BLE muscle pain with ambulation eve on the Crestor. No claudication like pain, just constant aches.  No chest pain but occasionally feels a sensation that her hart is "jumping in my chest or it takes a flip-flop".  Past Medical History:  Diagnosis Date  . Anxiety   . Arthritis pain   . Asthma   . Bruises easily   . Cancer (HCC)    BASAL CELL  . Carpal tunnel syndrome   . Cervical spinal stenosis 2008   per x-ray findings 2008  . Cervical syndrome   . Cervicalgia   . Chronic pain syndrome   . COPD (chronic obstructive pulmonary disease) ([place])   . Cough   . Depression   . Dysrhythmia    prolonged QT  . Family history of adverse reaction to anesthesia    PTS MOM GETS SICK  . Foot fracture, right 07/2010   per x-ray 07/2010 - oblique comminuted fifth metatarsal fracture, followed by Dr. Lorelei Pont  . GERD (gastroesophageal reflux disease)   . Headache    MIGRAINES  . Hepatitis    hx of Hep C -PT STATES SHE RECEIVED TREATMENT AND NO LONGER HAS HEP C  . Hypertension   . Insomnia   . Lesion of ulnar nerve   . Lumbago   . Osteoporosis   . Pain in limb   . PONV  (postoperative nausea and vomiting)    shortness of breath, nausea  . Primary localized osteoarthrosis, pelvic region and thigh   . Pulmonary nodule, right    RUL nodule, 7 mm noted on Chest XRAY - 06/2008, per CT follow up in 2009 - No suspicious pulmonary nodules or masse noted  . Shortness of breath dyspnea    ONLY WHEN WAKING UP IN AM-PT USES ALBUTEROL INHALER AND THAT SEEMS TO HELP  . Unexplained weight loss   . Visual disturbance   . Wears contact lenses   . Wears glasses   . Wound disruption, post-op, skin     Patient Active Problem List   Diagnosis Date Noted  . Palpitations 10/01/2014  . Tobacco abuse 07/23/2014  . Mouth lesion 07/23/2014  . Sore throat 07/23/2014  . Postoperative wound infection 02/13/2014  . Inguinal lymphadenopathy 12/30/2013  . Family history of long QT syndrome 09/18/2013  . Chest pain on exertion 09/18/2013  . Rash and nonspecific skin eruption 09/18/2013  . Leg pain 09/18/2013  . Right groin pain 06/18/2013  . Preventative health care 06/18/2013  . CAFL (chronic airflow limitation) ([place]) 04/10/2013  . BP (high blood pressure) 04/10/2013  . Closed traumatic PIP  dislocation 04/07/2013  . Abnormality of gait 12/16/2012  . Asthma 08/21/2012  . Myofascial pain dysfunction syndrome 06/25/2012  . Bursitis of hip, right 04/09/2012  . Hypertension 04/09/2012  . Headache(784.0) 04/09/2012  . Hyperlipidemia 04/09/2012  . Low back pain 12/26/2011  . Lumbar spondylosis 12/26/2011  . Localized primary carpometacarpal osteoarthritis 11/07/2011  . Chronic neck pain 11/07/2011  . Chronic calculus cholecystitis 10/03/2011  . Skin lesion of right arm 10/02/2011  . Osteoporosis 10/02/2011  . Gallstone 10/02/2011  . Hot flash, menopausal 01/17/2011  . History of hepatitis C 06/15/2010  . CARPAL TUNNEL SYNDROME, BILATERAL 12/06/2009  . GERD 06/22/2009  . Depression with anxiety 05/25/2009  . [place] DISEASE, CERVICAL 05/25/2009  . Insomnia 05/25/2009     Past Surgical History:  Procedure Laterality Date  . ABDOMINAL HYSTERECTOMY    . broken clavical    . CARPAL TUNNEL RELEASE     right and left  . CHOLECYSTECTOMY    . CHOLECYSTECTOMY  10/30/2011   Procedure: LAPAROSCOPIC CHOLECYSTECTOMY WITH INTRAOPERATIVE CHOLANGIOGRAM;  Surgeon: Imogene Burn. Georgette Dover, MD;  Location: [facility];  Service: General;  Laterality: N/A;  . COLONOSCOPY    . DORSAL COMPARTMENT RELEASE Left 07/20/2015   Procedure: RELEASE DORSAL COMPARTMENT (DEQUERVAIN);  Surgeon: Corky Mull, MD;  Location: [facility];  Service: Orthopedics;  Laterality: Left;  . drain in left ring finger     cat bite  . finger amputated     right ring  . HEMORRHOID SURGERY  11/25/2006  . INGUINAL LYMPH NODE BIOPSY Right 02/03/2014   Procedure: INGUINAL LYMPH NODE BIOPSY;  Surgeon: Zenovia Jarred, MD;  Location: [facility];  Service: General;  Laterality: Right;  right inguinal area  . SHOULDER ARTHROSCOPY Left 07/20/2015   Procedure: ARTHROSCOPY SHOULDER DEBRIDEMENT AND DECOMPRESSION;  Surgeon: Corky Mull, MD;  Location: [facility];  Service: Orthopedics;  Laterality: Left;  . SHOULDER ARTHROSCOPY Left 01/12/2016   Procedure: ARTHROSCOPY SHOULDER WITH DEBRIDEMENT AND EXCISION OF THE DISTAL CLAVICLE;  Surgeon: Corky Mull, MD;  Location: [facility];  Service: Orthopedics;  Laterality: Left;    OB History    No data available       Home Medications    Prior to Admission medications   Medication Sig Start Date End Date Taking? Authorizing Provider  albuterol (PROVENTIL HFA;VENTOLIN HFA) 108 (90 BASE) MCG/ACT inhaler Inhale 1-2 puffs into the lungs every 6 (six) hours as needed for wheezing or shortness of breath.   Yes Historical Provider, MD  clonazePAM (KLONOPIN) 0.5 MG tablet Take 0.5 mg by mouth 3 (three) times daily as needed for anxiety.  11/08/15  Yes Historical Provider, MD  ibuprofen (ADVIL,MOTRIN) 200 MG tablet Take 3 tablets (600 mg total) by mouth 3  (three) times daily. 07/20/15  Yes Corky Mull, MD  naproxen sodium (ANAPROX) 220 MG tablet Take 220 mg by mouth 2 (two) times daily as needed (pain).   Yes Historical Provider, MD  omeprazole (PRILOSEC) 20 MG capsule Take 20 mg by mouth every morning.    Yes Historical Provider, MD  rosuvastatin (CRESTOR) 20 MG tablet Take 1 tablet (20 mg total) by mouth daily. 11/27/16 02/25/17 Yes Wellington Hampshire, MD  traZODone (DESYREL) 50 MG tablet Take 100 mg by mouth at bedtime.    Yes Historical Provider, MD  vitamin E (VITAMIN E) 400 UNIT capsule Take 400 Units by mouth daily.   Yes Historical Provider, MD  atorvastatin (LIPITOR) 40 MG tablet Take 1 tablet (  40 mg total) by mouth daily. Patient not taking: Reported on 01/04/2017 11/22/15   Wellington Hampshire, MD  losartan (COZAAR) 50 MG tablet Take 1 tablet (50 mg total) by mouth daily. 01/04/17   Tanna Furry, MD  oxyCODONE (ROXICODONE) 5 MG immediate release tablet Take 1-2 tablets (5-10 mg total) by mouth every 4 (four) hours as needed for severe pain. Patient not taking: Reported on 01/04/2017 01/12/16   Corky Mull, MD    Family History Family History  Problem Relation Age of Onset  . Cancer Father     colon/prostate  . Colon cancer Father 34    deceased at age 22 from colon cancer  . Cancer Paternal Grandmother     ovarian  . Stroke Neg Hx   . Stomach cancer Neg Hx     Social History Social History  Substance Use Topics  . Smoking status: Former Smoker    Packs/day: 0.50    Years: 20.00    Types: Cigarettes    Quit date: 10/24/2014  . Smokeless tobacco: Never Used  . Alcohol use 0.0 oz/week     Comment: OCC BEER     Allergies   Morphine and related; Tylenol [acetaminophen]; Fluoxetine hcl; Lisinopril; Tramadol; and Hydrocodone   Review of Systems Review of Systems  Constitutional: Negative for appetite change, chills, diaphoresis, fatigue and fever.  HENT: Negative for mouth sores, sore throat and trouble swallowing.        Numbness  to lt upper lip  Eyes: Negative for visual disturbance.  Respiratory: Negative for cough, chest tightness, shortness of breath and wheezing.   Cardiovascular: Negative for chest pain.       Flip flops  Gastrointestinal: Negative for abdominal distention, abdominal pain, diarrhea, nausea and vomiting.  Endocrine: Negative for polydipsia, polyphagia and polyuria.  Genitourinary: Negative for dysuria, frequency and hematuria.  Musculoskeletal: Positive for gait problem and myalgias.  Skin: Negative for color change, pallor and rash.  Neurological: Negative for dizziness, syncope, light-headedness and headaches.  Hematological: Does not bruise/bleed easily.  Psychiatric/Behavioral: Negative for behavioral problems and confusion.     Physical Exam Updated Vital Signs BP (!) 157/83   Pulse 69   Temp 98.1 F (36.7 C) (Oral)   Resp 16   LMP 10/23/2002   SpO2 100%   Physical Exam  Constitutional: She is oriented to person, place, and time. She appears well-developed and well-nourished. No distress.  HENT:  Head: Normocephalic.  Reports decreased sensation throughout left face V1-V3.  Eyes: Conjunctivae are normal. Pupils are equal, round, and reactive to light. No scleral icterus.  Neck: Normal range of motion. Neck supple. No thyromegaly present.  Cardiovascular: Normal rate and regular rhythm.  Exam reveals no gallop and no friction rub.   No murmur heard. Occasional PVCs. 2/6 systolic murmur to LUSB, ?into carotids. Not heard into axilla or apex.  Pulmonary/Chest: Effort normal and breath sounds normal. No respiratory distress. She has no wheezes. She has no rales.  Abdominal: Soft. Bowel sounds are normal. She exhibits no distension. There is no tenderness. There is no rebound.  Musculoskeletal: Normal range of motion.  Neurological: She is alert and oriented to person, place, and time.  Decreased Lt facial light tough. No droop. Noother CN deficits. No drift of arm or leg. Normal  DTRs to BLE.  Skin: Skin is warm and dry. No rash noted.  Psychiatric: She has a normal mood and affect. Her behavior is normal.     ED Treatments / Results  Labs (all labs ordered are listed, but only abnormal results are displayed) Labs Reviewed  COMPREHENSIVE METABOLIC PANEL  CBC WITH DIFFERENTIAL/PLATELET  CK  I-STAT TROPOININ, ED    EKG  EKG Interpretation  Date/Time:  Friday January 04 2017 10:31:46 EDT Ventricular Rate:  82 PR Interval:  160 QRS Duration: 106 QT Interval:  388 QTC Calculation: 453 R Axis:   72 Text Interpretation:  Normal sinus rhythm Possible Left atrial enlargement Incomplete right bundle branch block Cannot rule out Anterior infarct , age undetermined Abnormal ECG Confirmed by Jeneen Rinks  MD, [place] ([id number]) on 01/04/2017 11:04:11 AM       Radiology Ct Head Wo Contrast  Result Date: 01/04/2017 CLINICAL DATA:  61 year old female with dizziness, blurred vision and facial numbness. Hypertensive. EXAM: CT HEAD WITHOUT CONTRAST TECHNIQUE: Contiguous axial images were obtained from the base of the skull through the vertex without intravenous contrast. COMPARISON:  Cervical spine MRI 11/24/2010. FINDINGS: Brain: No midline shift, ventriculomegaly, mass effect, evidence of mass lesion, intracranial hemorrhage or evidence of cortically based acute infarction. Gray-white matter differentiation is within normal limits throughout the brain. No cortical encephalomalacia identified. Minimal dystrophic basal ganglia calcifications. Cerebral volume loss may be mildly advanced for age. Vascular: Calcified atherosclerosis at the skull base. Skull: Negative.  No acute osseous abnormality identified. Sinuses/Orbits: Clear.  Tympanic cavities and mastoids are clear. Other: Mild optic nerve drusen (degenerative). Otherwise negative orbits soft tissues. Visualized scalp soft tissues are within normal limits. IMPRESSION: Negative noncontrast CT appearance of the brain. Electronically Signed    By: Genevie Ann M.D.   On: 01/04/2017 13:24    Procedures Procedures (including critical care time)  Medications Ordered in ED Medications  aspirin chewable tablet 324 mg (324 mg Oral Given 01/04/17 1430)     Initial Impression / Assessment and Plan / ED Course  I have reviewed the triage vital signs and the nursing notes.  Pertinent labs & imaging results that were available during my care of the patient were reviewed by me and considered in my medical decision making (see chart for details).   patient's symptoms have resolved. She states that she has normal sensation in her face her repeat neurological exam is normal. CT head is normal after 7 days of an mitten symptoms. Possibility is that these are TIAs. We discussed hospital admission for further workup of her TIAs which she politely declines. I will increase her Cozaar to 50 twice a day as it appears to be effectively controlling her blood pressure here. Given aspirin here and asked continue on a daily aspirin. She has dyspnea on exertion but does not have exertional pain. She has a new murmur that is consistent with possible aortic stenosis. I recommended that she follow-up with her cardiologist regarding her new murmur.  She does not have elevation of CPK to suggest myositis. However her muscle pain is likely secondary to her statin. She did not tolerate Lipitor. She is not tolerating Crestor and I have asked her to stop it and discuss this again with her primary care doctor. All questions were answered. Patient again politely declines admission. I think this is not an unreasonable course of action.  She was strongly urged to stop smoking which is her biggest risk for continued cardiovascular symptoms.    Final Clinical Impressions(s) / ED Diagnoses   Final diagnoses:  Hypertension, unspecified type  Transient cerebral ischemia, unspecified type  Heart murmur  Muscle pain    New Prescriptions Discharge Medication List as of  01/04/2017  2:30 PM       Tanna Furry, MD 01/04/17 1438

## 2017-01-04 NOTE — ED Triage Notes (Signed)
Pt states 2 days ago she started having dizziness and "seeing lights in her eyes that has been coming and going". Pt also reports chest tightness and bilateral lower leg pain into feet. Pt states she has been taking double her HTN medication for the last 2 days because her BP has been elevated at home. Pt is alert and 4.

## 2017-01-04 NOTE — Telephone Encounter (Signed)
Called patient regarding the elevated BP being consistently high with dizziness and blurred vision. Patient was anxious on phone, complained of seeing lights, extremely dizzy, severe leg aches, throbbing pains, left side of lips numb, feet were hurting. Told patient to call 911 because the symptoms she were having could result in a stroke. Told her to go ahead and call 911 right now and get treated immediately in the emergency room. Patient stated okay and she would call 911. Told patient Dr. Fletcher Anon would be informed and I am very concerned about her BP and symptoms she is currently having.

## 2017-01-04 NOTE — Telephone Encounter (Signed)
New message    Pt is calling.  Pt c/o medication issue:  1. Name of Medication: Crestor   2. How are you currently taking this medication (dosage and times per day)? Once daily at night  3. Are you having a reaction (difficulty breathing--STAT)? Leg ache, bp elevated  4. What is your medication issue? Pt states that medication is causing her to have terrible leg aches.   Pt c/o BP issue: STAT if pt c/o blurred vision, one-sided weakness or slurred speech  1. What are your last 5 BP readings? 148/102, 161/100, 170/109, 181/131, 152/86  2. Are you having any other symptoms (ex. Dizziness, headache, blurred vision, passed out)? Blurred vision the past couple mornings, dizzy  3. What is your BP issue? Elevated bp

## 2017-01-04 NOTE — Discharge Instructions (Signed)
Take one 81 mg aspirin daily to prevent strokes. Call Dr. Caryl Comes, your cardiologist, for an appointment to evaluate your new murmur Prescription for Cozaar 50 mg daily. Return to ER with any new or worsening symptoms. Check and record your blood pressure twice daily for your next physician appointment. Please see your physician within the next 7 days Stop your Crestor, it is likely causing her muscular pain

## 2017-01-08 ENCOUNTER — Ambulatory Visit (INDEPENDENT_AMBULATORY_CARE_PROVIDER_SITE_OTHER): Payer: Medicare HMO | Admitting: Cardiovascular Disease

## 2017-01-08 VITALS — BP 138/78 | HR 60 | Ht 64.0 in | Wt 121.2 lb

## 2017-01-08 DIAGNOSIS — I10 Essential (primary) hypertension: Secondary | ICD-10-CM | POA: Diagnosis not present

## 2017-01-08 DIAGNOSIS — E785 Hyperlipidemia, unspecified: Secondary | ICD-10-CM

## 2017-01-08 DIAGNOSIS — R011 Cardiac murmur, unspecified: Secondary | ICD-10-CM

## 2017-01-08 DIAGNOSIS — Z79899 Other long term (current) drug therapy: Secondary | ICD-10-CM

## 2017-01-08 MED ORDER — LOSARTAN POTASSIUM 100 MG PO TABS: 100.0000 mg | ORAL_TABLET | Freq: Every day | ORAL | 3 refills | Status: DC

## 2017-01-08 MED ORDER — EZETIMIBE 10 MG PO TABS: 10.0000 mg | ORAL_TABLET | Freq: Every day | ORAL | 3 refills | Status: DC

## 2017-01-08 NOTE — Patient Instructions (Signed)
Medication Instructions:  INCREASE losartan to 100mg  daily  START zetia 10mg  (1 tablet) daily  Labwork: Please return for FASTING blood work in 6 weeks (lipid, hepatic)  Testing/Procedures: Your physician has requested that you have an echocardiogram. Echocardiography is a painless test that uses sound waves to create images of your heart. It provides your doctor with information about the size and shape of your heart and how well your heart's chambers and valves are working. This procedure takes approximately one hour. There are no restrictions for this procedure.   Follow-Up: Your physician wants you to follow-up in: 6 MONTHS with Dr. Fletcher Anon. You will receive a reminder letter in the mail two months in advance. If you don't receive a letter, please call our office to schedule the follow-up appointment.   Any Other Special Instructions Will Be Listed Below (If Applicable).     If you need a refill on your cardiac medications before your next appointment, please call your pharmacy.

## 2017-01-08 NOTE — Progress Notes (Signed)
Cardiology Office Note   Date:  01/08/2017   ID:  Amanda Bennett, DOB Mar 29, 1956, MRN [id number]  PCP:  Alvester Chou, NP  Cardiologist:   Kathlyn Sacramento, MD   Chief Complaint  Patient presents with  . Follow-up    ER visit, heart murmur      History of Present Illness: Amanda Bennett is a 61 y.o. female who presents for a follow up visit regarding hypertension, hyperlipidemia and previously prolonged QT interval.  She has history of prior tobacco use, hypertension, anxiety and hepatitis C. she quit smoking in 2016. She has family history of prolonged QT syndrome and she did have mildly prolonged QT on EKG in the setting of treatment with medications that can cause that. QT went back to normal after stopping the medications. She was seen by Dr. Caryl Comes and no further workup was recommended. Nuclear stress test in 2015 was normal. Previous CT scan did show evidence of coronary atherosclerosis. Holter monitor in 2016 showed normal sinus rhythm with rare PACs and PVCs.  During last visit, she was switched from atorvastatin to rosuvastatin given that she was having myalgia and her LDL was 174. She went to the emergency room recently for elevated blood pressure. She reported intermittent numbness in the left upper lip. CT head was unremarkable. The dose of losartan was increased to 50 mg once daily. Rosuvastatin was discontinued due to severe myalgia which has resolved since then. Blood pressure improved at home but she did have a reading of 160 yesterday morning. The patient is a previous smoker but does not smoke currently.   Past Medical History:  Diagnosis Date  . Anxiety   . Arthritis pain   . Asthma   . Bruises easily   . Cancer (HCC)    BASAL CELL  . Carpal tunnel syndrome   . Cervical spinal stenosis 2008   per x-ray findings 2008  . Cervical syndrome   . Cervicalgia   . Chronic pain syndrome   . COPD (chronic obstructive pulmonary disease) ([place])   . Cough   .  Depression   . Dysrhythmia    prolonged QT  . Family history of adverse reaction to anesthesia    PTS MOM GETS SICK  . Foot fracture, right 07/2010   per x-ray 07/2010 - oblique comminuted fifth metatarsal fracture, followed by Dr. Lorelei Pont  . GERD (gastroesophageal reflux disease)   . Headache    MIGRAINES  . Hepatitis    hx of Hep C -PT STATES SHE RECEIVED TREATMENT AND NO LONGER HAS HEP C  . Hypertension   . Insomnia   . Lesion of ulnar nerve   . Lumbago   . Osteoporosis   . Pain in limb   . PONV (postoperative nausea and vomiting)    shortness of breath, nausea  . Primary localized osteoarthrosis, pelvic region and thigh   . Pulmonary nodule, right    RUL nodule, 7 mm noted on Chest XRAY - 06/2008, per CT follow up in 2009 - No suspicious pulmonary nodules or masse noted  . Shortness of breath dyspnea    ONLY WHEN WAKING UP IN AM-PT USES ALBUTEROL INHALER AND THAT SEEMS TO HELP  . Unexplained weight loss   . Visual disturbance   . Wears contact lenses   . Wears glasses   . Wound disruption, post-op, skin     Past Surgical History:  Procedure Laterality Date  . ABDOMINAL HYSTERECTOMY    . broken clavical    .  CARPAL TUNNEL RELEASE     right and left  . CHOLECYSTECTOMY    . CHOLECYSTECTOMY  10/30/2011   Procedure: LAPAROSCOPIC CHOLECYSTECTOMY WITH INTRAOPERATIVE CHOLANGIOGRAM;  Surgeon: Imogene Burn. Georgette Dover, MD;  Location: [facility];  Service: General;  Laterality: N/A;  . COLONOSCOPY    . DORSAL COMPARTMENT RELEASE Left 07/20/2015   Procedure: RELEASE DORSAL COMPARTMENT (DEQUERVAIN);  Surgeon: Corky Mull, MD;  Location: [facility];  Service: Orthopedics;  Laterality: Left;  . drain in left ring finger     cat bite  . finger amputated     right ring  . HEMORRHOID SURGERY  11/25/2006  . INGUINAL LYMPH NODE BIOPSY Right 02/03/2014   Procedure: INGUINAL LYMPH NODE BIOPSY;  Surgeon: Zenovia Jarred, MD;  Location: [facility];  Service: General;   Laterality: Right;  right inguinal area  . SHOULDER ARTHROSCOPY Left 07/20/2015   Procedure: ARTHROSCOPY SHOULDER DEBRIDEMENT AND DECOMPRESSION;  Surgeon: Corky Mull, MD;  Location: [facility];  Service: Orthopedics;  Laterality: Left;  . SHOULDER ARTHROSCOPY Left 01/12/2016   Procedure: ARTHROSCOPY SHOULDER WITH DEBRIDEMENT AND EXCISION OF THE DISTAL CLAVICLE;  Surgeon: Corky Mull, MD;  Location: [facility];  Service: Orthopedics;  Laterality: Left;     Current Outpatient Prescriptions  Medication Sig Dispense Refill  . albuterol (PROVENTIL HFA;VENTOLIN HFA) 108 (90 BASE) MCG/ACT inhaler Inhale 1-2 puffs into the lungs every 6 (six) hours as needed for wheezing or shortness of breath.    . clonazePAM (KLONOPIN) 0.5 MG tablet Take 0.5 mg by mouth 3 (three) times daily as needed for anxiety.     . Coenzyme Q10 (CO Q 10) 100 MG CAPS Take 1 capsule by mouth daily.    Marland Kitchen ibuprofen (ADVIL,MOTRIN) 200 MG tablet Take 3 tablets (600 mg total) by mouth 3 (three) times daily. 30 tablet 0  . losartan (COZAAR) 50 MG tablet Take 1 tablet (50 mg total) by mouth daily. 30 tablet 0  . LYRICA 150 MG capsule Take 1 capsule by mouth 2 (two) times daily.    . naproxen sodium (ANAPROX) 220 MG tablet Take 220 mg by mouth 2 (two) times daily as needed (pain).    Marland Kitchen omeprazole (PRILOSEC) 20 MG capsule Take 20 mg by mouth every morning.     . traZODone (DESYREL) 50 MG tablet Take 100 mg by mouth at bedtime.     . vitamin E (VITAMIN E) 400 UNIT capsule Take 400 Units by mouth daily.     No current facility-administered medications for this visit.     Allergies:   Morphine and related; Tylenol [acetaminophen]; Fluoxetine hcl; Lisinopril; Tramadol; and Hydrocodone    Social History:  The patient  reports that she quit smoking about 2 years ago. Her smoking use included Cigarettes. She has a 10.00 pack-year smoking history. She has never used smokeless tobacco. She reports that she drinks alcohol. She reports  that she does not use drugs.   Family History:  The patient's family history includes Cancer in her father and paternal grandmother; Colon cancer (age of onset: 92) in her father.    ROS:  Please see the history of present illness.   Otherwise, review of systems are positive for none.   All other systems are reviewed and negative.    PHYSICAL EXAM: VS:  BP 138/78   Pulse 60   Ht 5\' 4"  (1.626 m)   Wt 121 lb 3.2 oz (55 kg)   LMP 10/23/2002   BMI 20.80  kg/m  , BMI Body mass index is 20.8 kg/m. GEN: Well nourished, well developed, in no acute distress  HEENT: normal  Neck: no JVD, carotid bruits, or masses Cardiac: RRR; no murmurs, rubs, or gallops,no edema  Respiratory:  clear to auscultation bilaterally, normal work of breathing GI: soft, nontender, nondistended, + BS MS: no deformity or atrophy  Skin: warm and dry, no rash Neuro:  Strength and sensation are intact Psych: euthymic mood, full affect   EKG:  EKG is not ordered today.    Recent Labs: 01/04/2017: ALT 16; BUN 10; Creatinine, Ser 0.61; Hemoglobin 13.2; Platelets 239; Potassium 3.8; Sodium 140    Lipid Panel    Component Value Date/Time   CHOL 281 (H) 11/20/2016 1148   TRIG 234 (H) 11/20/2016 1148   HDL 60 11/20/2016 1148   CHOLHDL 4.7 11/20/2016 1148   VLDL 47 (H) 11/20/2016 1148   LDLCALC 174 (H) 11/20/2016 1148   LDLDIRECT 136 (H) 04/08/2012 1125      Wt Readings from Last 3 Encounters:  01/08/17 121 lb 3.2 oz (55 kg)  11/20/16 124 lb (56.2 kg)  02/06/16 108 lb 4 oz (49.1 kg)        ASSESSMENT AND PLAN:  1.  Essential hypertension: Blood pressure improved but still not controlled. I increased losartan to 100 mg once daily.  2. Hyperlipidemia with intolerance to atorvastatin and rosuvastatin due to severe myalgia. I elected to add Zetia 10 mg once daily. Check lipid and liver profile in 6 weeks.  3. Recent cardiac murmur: I do not appreciate a heart murmur today by exam but this was noted  during her most recent emergency room visit in the setting of elevated blood pressure. Given concerns about possible TIA recently, I requested an echocardiogram.  4. Palpitations: Previous Holter monitor showed rare PACs and PVCs.   Symptoms are overall stable.   Disposition:   FU with me in 6 months  Signed,  Kathlyn Sacramento, MD  01/08/2017 9:52 AM    [facility]

## 2017-01-22 ENCOUNTER — Other Ambulatory Visit: Payer: Self-pay

## 2017-01-22 ENCOUNTER — Ambulatory Visit (HOSPITAL_COMMUNITY): Payer: Medicare HMO | Attending: Cardiology

## 2017-01-22 DIAGNOSIS — R011 Cardiac murmur, unspecified: Secondary | ICD-10-CM | POA: Insufficient documentation

## 2017-01-22 DIAGNOSIS — E785 Hyperlipidemia, unspecified: Secondary | ICD-10-CM | POA: Diagnosis not present

## 2017-01-22 DIAGNOSIS — J449 Chronic obstructive pulmonary disease, unspecified: Secondary | ICD-10-CM | POA: Diagnosis not present

## 2017-01-22 DIAGNOSIS — I253 Aneurysm of heart: Secondary | ICD-10-CM | POA: Diagnosis not present

## 2017-01-22 DIAGNOSIS — I10 Essential (primary) hypertension: Secondary | ICD-10-CM | POA: Diagnosis not present

## 2017-02-05 ENCOUNTER — Other Ambulatory Visit: Payer: Medicare HMO

## 2017-02-05 ENCOUNTER — Telehealth: Payer: Self-pay | Admitting: Cardiovascular Disease

## 2017-02-05 NOTE — Telephone Encounter (Signed)
I spoke with the pt and advised that per 01/08/17 OV note she is due for repeat fasting blood work in 6 weeks.  The pt tried to have her labs done with PCP and was advised that it is to soon and insurance will not pay for it this early.  The pt plans to contact her insurance company to make sure that they will cover repeat labs in mid June. I instructed her to contact the office if she has further issues and we can help provide documentation of medical necessity. Pt agreed with plan.

## 2017-02-05 NOTE — Telephone Encounter (Signed)
Patient calling, states that Dr. Fletcher Anon wanted her to have a lipid panel completed. Patient was not able to have it completed, she states that her insurance would not cover it. Patient states that she will have to call her insurance to see when she can it completed and she wanted to make Dr. Fletcher Anon aware.

## 2017-02-06 ENCOUNTER — Telehealth: Payer: Self-pay | Admitting: *Deleted

## 2017-02-06 NOTE — Telephone Encounter (Signed)
Robbin,  This pt is cleared for anesthetic care at [facility].   Thanks,   Amritha Yorke 

## 2017-02-06 NOTE — Telephone Encounter (Signed)
Please call her and  find out if she has seen her PCP in follow-up I saw cardiology/echo notes We may need to give her a few months to make sure she is ok for the colonoscopy Also find out if she is having any GI sxs that would need attention like bleeding, abdominal pain, diarrhea/constipation, etc  Thanks Will wait for f/u

## 2017-02-06 NOTE — Telephone Encounter (Signed)
Spoke with patient. She states she was told yesterday that she has a "small aneurysm" but was only told to keep taking aspirin and f/u w/cardiologist. She did see her PCP last Thursday with no new treatments or recommendations. Patient said PCP told her to go ahead with the colonoscopy. Patient denies any "new" GI symptoms, she does get diarrhea when eating spicy foods and she sees "fresh blood" on tissue from hemorrhoid, per pt. Last seen 1 week ago. No cardiac symptoms per pt.

## 2017-02-06 NOTE — Telephone Encounter (Signed)
Patient is for recall colon on 02/27/17 for [facility]. Per chart patient was seen in ED 01/04/17 for possible TIA, and having chest pain. Patient was started on ASA. CT done and ECHO. Please review, is patient okay for recall colon at this time? Thanks, Beth Goodlin pv.

## 2017-02-07 NOTE — Telephone Encounter (Signed)
Sounds good to go ahead. Thanks

## 2017-02-11 NOTE — Telephone Encounter (Signed)
Reached pt via phone. Heloise Gordan/PV

## 2017-02-13 ENCOUNTER — Telehealth: Payer: Self-pay | Admitting: Cardiovascular Disease

## 2017-02-13 ENCOUNTER — Encounter: Payer: Self-pay | Admitting: Internal Medicine

## 2017-02-13 ENCOUNTER — Ambulatory Visit (AMBULATORY_SURGERY_CENTER): Payer: Self-pay

## 2017-02-13 VITALS — Ht 64.0 in | Wt 117.0 lb

## 2017-02-13 DIAGNOSIS — Z800 Family history of malignant neoplasm of digestive organs: Secondary | ICD-10-CM

## 2017-02-13 NOTE — Telephone Encounter (Signed)
Reference #[id number]   I spoke with rep at [facility] and they follow Medicare guidelines in regards to checking lab work. Per rep the pt's current plan covers 100% of lab work if medically necessary.

## 2017-02-13 NOTE — Telephone Encounter (Signed)
Pt is supposed to have labs done next week. Call [employer] and tell them why we are doing ~ every 6 weeks and they will pay for it

## 2017-02-13 NOTE — Telephone Encounter (Signed)
New Message:   Please call,concerning lab work she needs.

## 2017-02-13 NOTE — Telephone Encounter (Signed)
S/w pt she states that she got a call from [facility] stating that we do [place] too often and they are not going to pay for it. Call [facility] [phone] and CB pt with lab instructions.

## 2017-02-13 NOTE — Progress Notes (Signed)
No allergies to eggs or soy No diet meds No home oxygen No past problems with anesthesia  Declined emmi 

## 2017-02-13 NOTE — Telephone Encounter (Signed)
I spoke with the pt and made her aware of this information and that I am waiting on final review by our billing staff in regards to doing blood work.

## 2017-02-18 NOTE — Telephone Encounter (Signed)
Follow up  ° ° ° °Pt is calling to follow up on this.  °

## 2017-02-18 NOTE — Telephone Encounter (Signed)
Attempted to reach the pt but no answer.  I will call the pt on 02/19/17.

## 2017-02-19 NOTE — Telephone Encounter (Signed)
I spoke with the pt and she has decided that she will proceed with lab work.  The pt is going to her PCP 02/28/17 for a wellness check and will have labs drawn at that time. I advised her that she is due for a lipid and liver profile. I also provided her with the office fax number so the PCP can forward results to our office for review.  Pt was appreciative of my phone call.

## 2017-02-27 ENCOUNTER — Ambulatory Visit (AMBULATORY_SURGERY_CENTER): Payer: Medicare HMO | Admitting: Internal Medicine

## 2017-02-27 ENCOUNTER — Encounter: Payer: Self-pay | Admitting: Internal Medicine

## 2017-02-27 VITALS — BP 133/84 | HR 62 | Temp 98.0°F | Resp 12 | Ht 64.0 in | Wt 117.0 lb

## 2017-02-27 DIAGNOSIS — Z1211 Encounter for screening for malignant neoplasm of colon: Secondary | ICD-10-CM | POA: Diagnosis not present

## 2017-02-27 DIAGNOSIS — Z1212 Encounter for screening for malignant neoplasm of rectum: Secondary | ICD-10-CM

## 2017-02-27 DIAGNOSIS — Z800 Family history of malignant neoplasm of digestive organs: Secondary | ICD-10-CM | POA: Diagnosis present

## 2017-02-27 MED ORDER — SODIUM CHLORIDE 0.9 % IV SOLN: 500.0000 mL | INTRAVENOUS | Status: DC

## 2017-02-27 NOTE — Op Note (Signed)
[facility] Patient Name: Amanda Bennett Procedure Date: 02/27/2017 11:16 AM MRN: [id number] Endoscopist: Gatha Mayer , MD Age: 61 Referring MD:  Date of Birth: 08/03/1956 Gender: Female Account #: [id number] Procedure:                Colonoscopy Indications:              Screening in patient at increased risk: Family                            history of 1st-degree relative with colorectal                            cancer Medicines:                Propofol per Anesthesia, Monitored Anesthesia Care Procedure:                Pre-Anesthesia Assessment:                           - Prior to the procedure, a History and Physical                            was performed, and patient medications and                            allergies were reviewed. The patient's tolerance of                            previous anesthesia was also reviewed. The risks                            and benefits of the procedure and the sedation                            options and risks were discussed with the patient.                            All questions were answered, and informed consent                            was obtained. Prior Anticoagulants: The patient has                            taken no previous anticoagulant or antiplatelet                            agents. ASA Grade Assessment: II - A patient with                            mild systemic disease. After reviewing the risks                            and benefits, the patient was deemed in  satisfactory condition to undergo the procedure.                           After obtaining informed consent, the colonoscope                            was passed under direct vision. Throughout the                            procedure, the patient's blood pressure, pulse, and                            oxygen saturations were monitored continuously. The                            Colonoscope was introduced  through the anus and                            advanced to the the cecum, identified by                            appendiceal orifice and ileocecal valve. The                            ileocecal valve, appendiceal orifice, and rectum                            were photographed. The quality of the bowel                            preparation was excellent. The colonoscopy was                            performed without difficulty. The patient tolerated                            the procedure well. The bowel preparation used was                            Miralax. Scope In: 11:24:37 AM Scope Out: 11:37:31 AM Scope Withdrawal Time: 0 hours 7 minutes 6 seconds  Total Procedure Duration: 0 hours 12 minutes 54 seconds  Findings:                 The perianal and digital rectal examinations were                            normal.                           Multiple small and large-mouthed diverticula were                            found in the sigmoid colon. There was narrowing of  the colon in association with the diverticular                            opening.                           The exam was otherwise without abnormality on                            direct and retroflexion views. Complications:            No immediate complications. Estimated blood loss:                            None. Estimated Blood Loss:     Estimated blood loss: none. Impression:               - Diverticulosis in the sigmoid colon. There was                            narrowing of the colon in association with the                            diverticular opening.                           - The examination was otherwise normal on direct                            and retroflexion views.                           - No specimens collected. Recommendation:           - Repeat colonoscopy in 5 years for screening                            purposes. Father had colon cancer in his  38's                           - Patient has a contact number available for                            emergencies. The signs and symptoms of potential                            delayed complications were discussed with the                            patient. Return to normal activities tomorrow.                            Written discharge instructions were provided to the                            patient.                           -  Resume previous diet.                           - Continue present medications. Gatha Mayer, MD 02/27/2017 11:43:23 AM This report has been signed electronically.

## 2017-02-27 NOTE — Patient Instructions (Addendum)
No polyps or cancer found - so next routine colonoscopy in 5 years 2023 given family history of colon cancer.  I appreciate the opportunity to care for you. Gatha Mayer, MD, FACG  YOU HAD AN ENDOSCOPIC PROCEDURE TODAY AT [facility]:   Refer to the procedure report that was given to you for any specific questions about what was found during the examination.  If the procedure report does not answer your questions, please call your gastroenterologist to clarify.  If you requested that your care partner not be given the details of your procedure findings, then the procedure report has been included in a sealed envelope for you to review at your convenience later.  YOU SHOULD EXPECT: Some feelings of bloating in the abdomen. Passage of more gas than usual.  Walking can help get rid of the air that was put into your GI tract during the procedure and reduce the bloating. If you had a lower endoscopy (such as a colonoscopy or flexible sigmoidoscopy) you may notice spotting of blood in your stool or on the toilet paper. If you underwent a bowel prep for your procedure, you may not have a normal bowel movement for a few days.  Please Note:  You might notice some irritation and congestion in your nose or some drainage.  This is from the oxygen used during your procedure.  There is no need for concern and it should clear up in a day or so.  SYMPTOMS TO REPORT IMMEDIATELY:   Following lower endoscopy (colonoscopy or flexible sigmoidoscopy):  Excessive amounts of blood in the stool  Significant tenderness or worsening of abdominal pains  Swelling of the abdomen that is new, acute  Fever of 100F or higher  For urgent or emergent issues, a gastroenterologist can be reached at any hour by calling [phone].   DIET:  We do recommend a small meal at first, but then you may proceed to your regular diet.  Drink plenty of fluids but you should avoid alcoholic beverages for 24  hours.  MEDICATIONS: Continue present medications.  Please see handouts given to you by your recovery nurse.  ACTIVITY:  You should plan to take it easy for the rest of today and you should NOT DRIVE or use heavy machinery until tomorrow (because of the sedation medicines used during the test).    FOLLOW UP: Our staff will call the number listed on your records the next business day following your procedure to check on you and address any questions or concerns that you may have regarding the information given to you following your procedure. If we do not reach you, we will leave a message.  However, if you are feeling well and you are not experiencing any problems, there is no need to return our call.  We will assume that you have returned to your regular daily activities without incident.  If any biopsies were taken you will be contacted by phone or by letter within the next 1-3 weeks.  Please call us at [phone] if you have not heard about the biopsies in 3 weeks.   Thank you for allowing [place] to provide for your healthcare needs today.   SIGNATURES/CONFIDENTIALITY: You and/or your care partner have signed paperwork which will be entered into your electronic medical record.  These signatures attest to the fact that that the information above on your After Visit Summary has been reviewed and is understood.  Full responsibility of the confidentiality of this  discharge information lies with you and/or your care-partner. 

## 2017-02-27 NOTE — Progress Notes (Signed)
Alert and oriented x3, pleased with MAC, report to RN Judson Roch

## 2017-02-28 ENCOUNTER — Telehealth: Payer: Self-pay

## 2017-02-28 ENCOUNTER — Telehealth: Payer: Self-pay | Admitting: *Deleted

## 2017-02-28 NOTE — Telephone Encounter (Signed)
  Follow up Call-  Call back number 02/27/2017  Post procedure Call Back phone  # [phone]  Permission to leave phone message Yes  Some recent data might be hidden     Patient questions:  Do you have a fever, pain , or abdominal swelling? No. Pain Score  0 *  Have you tolerated food without any problems? Yes.    Have you been able to return to your normal activities? Yes.    Do you have any questions about your discharge instructions: Diet   No. Medications  No. Follow up visit  No.  Do you have questions or concerns about your Care? No.  Actions: * If pain score is 4 or above: No action needed, pain <4.  Patient states she was "queasy" this morning but starting to feel better. She will call us back if symptom worsen. Encouraged patient to push fluids today. She understands.

## 2017-03-01 NOTE — Telephone Encounter (Signed)
Will route to the lung screening pool. 

## 2017-03-04 NOTE — Telephone Encounter (Signed)
Spoke with Amanda Bennett at [facility] office - I explained lung screening program to her - She requested info about program.  Criteria info faxed to her.  Nothing further needed.

## 2017-05-17 ENCOUNTER — Other Ambulatory Visit: Payer: Self-pay | Admitting: Student

## 2017-05-17 DIAGNOSIS — G959 Disease of spinal cord, unspecified: Secondary | ICD-10-CM

## 2017-05-17 DIAGNOSIS — M545 Low back pain: Secondary | ICD-10-CM

## 2017-05-28 ENCOUNTER — Ambulatory Visit
Admission: RE | Admit: 2017-05-28 | Discharge: 2017-05-28 | Disposition: A | Discharge status: Home / Self Care | Payer: Medicare HMO | Source: Ambulatory Visit | Attending: Student | Admitting: Student

## 2017-05-28 DIAGNOSIS — M4316 Spondylolisthesis, lumbar region: Secondary | ICD-10-CM | POA: Insufficient documentation

## 2017-05-28 DIAGNOSIS — M5440 Lumbago with sciatica, unspecified side: Secondary | ICD-10-CM | POA: Diagnosis present

## 2017-05-28 DIAGNOSIS — M5126 Other intervertebral disc displacement, lumbar region: Secondary | ICD-10-CM | POA: Diagnosis not present

## 2017-05-28 DIAGNOSIS — M545 Low back pain: Secondary | ICD-10-CM

## 2017-05-28 DIAGNOSIS — G959 Disease of spinal cord, unspecified: Secondary | ICD-10-CM | POA: Diagnosis not present

## 2017-07-09 ENCOUNTER — Ambulatory Visit: Payer: Medicare HMO | Admitting: Cardiovascular Disease

## 2017-08-06 ENCOUNTER — Ambulatory Visit (INDEPENDENT_AMBULATORY_CARE_PROVIDER_SITE_OTHER): Payer: Medicare HMO | Admitting: Cardiovascular Disease

## 2017-08-06 ENCOUNTER — Encounter: Payer: Self-pay | Admitting: Cardiovascular Disease

## 2017-08-06 VITALS — BP 116/70 | HR 68 | Ht 64.0 in | Wt 110.8 lb

## 2017-08-06 DIAGNOSIS — I10 Essential (primary) hypertension: Secondary | ICD-10-CM | POA: Diagnosis not present

## 2017-08-06 DIAGNOSIS — I779 Disorder of arteries and arterioles, unspecified: Secondary | ICD-10-CM

## 2017-08-06 DIAGNOSIS — I739 Peripheral vascular disease, unspecified: Principal | ICD-10-CM

## 2017-08-06 DIAGNOSIS — E785 Hyperlipidemia, unspecified: Secondary | ICD-10-CM | POA: Diagnosis not present

## 2017-08-06 NOTE — Patient Instructions (Addendum)
Medication Instructions: Your physician recommends that you continue on your current medications as directed. Please refer to the Current Medication list given to you today.  If you need a refill on your cardiac medications before your next appointment, please call your pharmacy.    Procedures/Testing: Your physician has requested that you have a carotid duplex. This test is an ultrasound of the carotid arteries in your neck. It looks at blood flow through these arteries that supply the brain with blood. Allow one hour for this exam. There are no restrictions or special instructions. This will be done at [address], [address].   Follow-Up: Your physician wants you to follow-up in: 12 months with Dr. Arida. You will receive a reminder letter in the mail two months in advance. If you don't receive a letter, please call our office at [phone] to schedule this follow-up appointment.   Thank you for choosing [facility]!!      

## 2017-08-06 NOTE — Progress Notes (Signed)
Cardiology Office Note   Date:  08/06/2017   ID:  Amanda Bennett, DOB 06-Aug-1956, MRN [id number]  PCP:  Alvester Chou, NP  Cardiologist:   Kathlyn Sacramento, MD   No chief complaint on file.     History of Present Illness: Amanda Bennett is a 61 y.o. female who presents for a follow up visit regarding hypertension, hyperlipidemia and previously prolonged QT interval.  She has history of prior tobacco use, hypertension, anxiety and hepatitis C. she quit smoking in 2016. She has family history of prolonged QT syndrome and she did have mildly prolonged QT on EKG in the setting of treatment with medications that can cause that. QT went back to normal after stopping the medications. She was seen by Dr. Caryl Comes and no further workup was recommended. Nuclear stress test in 2015 was normal. Previous CT scan did show evidence of coronary atherosclerosis. Echocardiogram in May 2018 showed normal LV systolic function and atrial septal aneurysm.  She has known history of hyperlipidemia and did not tolerate treatment with atorvastatin or rosuvastatin due to myalgia.  She is tolerating Zetia.  She had an MRI of cervical spine which showed occluded left vertebral artery.  She has no prior history of stroke.  Blood pressure has been controlled with losartan. She started smoking again but quit in September.  She has been doing well and denies any chest pain, shortness of breath or palpitations.  Past Medical History:  Diagnosis Date  . Anxiety   . Arthritis pain   . Asthma   . Bruises easily   . Cancer (HCC)    BASAL CELL  . Carpal tunnel syndrome   . Cervical spinal stenosis 2008   per x-ray findings 2008  . Cervical syndrome   . Cervicalgia   . Chronic pain syndrome   . COPD (chronic obstructive pulmonary disease) ([place])   . Cough   . Depression   . Dysrhythmia    prolonged QT  . Family history of adverse reaction to anesthesia    PTS MOM GETS SICK  . Foot fracture, right 07/2010   per x-ray 07/2010 - oblique comminuted fifth metatarsal fracture, followed by Dr. Lorelei Pont  . GERD (gastroesophageal reflux disease)   . Headache    MIGRAINES  . Hepatitis    hx of Hep C -PT STATES SHE RECEIVED TREATMENT AND NO LONGER HAS HEP C  . Hypertension   . Insomnia   . Lesion of ulnar nerve   . Lumbago   . Osteoporosis   . Pain in limb   . PONV (postoperative nausea and vomiting)    shortness of breath, nausea  . Primary localized osteoarthrosis, pelvic region and thigh   . Pulmonary nodule, right    RUL nodule, 7 mm noted on Chest XRAY - 06/2008, per CT follow up in 2009 - No suspicious pulmonary nodules or masse noted  . Shortness of breath dyspnea    ONLY WHEN WAKING UP IN AM-PT USES ALBUTEROL INHALER AND THAT SEEMS TO HELP  . Unexplained weight loss   . Visual disturbance   . Wears contact lenses   . Wears glasses   . Wound disruption, post-op, skin     Past Surgical History:  Procedure Laterality Date  . ABDOMINAL HYSTERECTOMY    . broken clavical    . CARPAL TUNNEL RELEASE     right and left  . CHOLECYSTECTOMY    . CHOLECYSTECTOMY  10/30/2011   Procedure: LAPAROSCOPIC CHOLECYSTECTOMY WITH INTRAOPERATIVE CHOLANGIOGRAM;  Surgeon: Imogene Burn. Georgette Dover, MD;  Location: [facility];  Service: General;  Laterality: N/A;  . COLONOSCOPY    . DORSAL COMPARTMENT RELEASE Left 07/20/2015   Procedure: RELEASE DORSAL COMPARTMENT (DEQUERVAIN);  Surgeon: Corky Mull, MD;  Location: [facility];  Service: Orthopedics;  Laterality: Left;  . drain in left ring finger     cat bite  . finger amputated     right ring  . HEMORRHOID SURGERY  11/25/2006  . INGUINAL LYMPH NODE BIOPSY Right 02/03/2014   Procedure: INGUINAL LYMPH NODE BIOPSY;  Surgeon: Zenovia Jarred, MD;  Location: [facility];  Service: General;  Laterality: Right;  right inguinal area  . SHOULDER ARTHROSCOPY Left 07/20/2015   Procedure: ARTHROSCOPY SHOULDER DEBRIDEMENT AND DECOMPRESSION;  Surgeon: Corky Mull, MD;  Location: [facility];  Service: Orthopedics;  Laterality: Left;  . SHOULDER ARTHROSCOPY Left 01/12/2016   Procedure: ARTHROSCOPY SHOULDER WITH DEBRIDEMENT AND EXCISION OF THE DISTAL CLAVICLE;  Surgeon: Corky Mull, MD;  Location: [facility];  Service: Orthopedics;  Laterality: Left;     Current Outpatient Medications  Medication Sig Dispense Refill  . albuterol (PROVENTIL HFA;VENTOLIN HFA) 108 (90 BASE) MCG/ACT inhaler Inhale 1-2 puffs into the lungs every 6 (six) hours as needed for wheezing or shortness of breath.    Marland Kitchen aspirin 81 MG chewable tablet Chew 81 mg by mouth daily.    Marland Kitchen BIOTIN PO Take 500 mcg by mouth.    . clonazePAM (KLONOPIN) 0.5 MG tablet Take 0.5 mg by mouth 3 (three) times daily as needed for anxiety.     . Coenzyme Q10 (CO Q 10) 100 MG CAPS Take 1 capsule by mouth daily.    Marland Kitchen ezetimibe (ZETIA) 10 MG tablet Take 1 tablet (10 mg total) by mouth daily. 90 tablet 3  . ibuprofen (ADVIL,MOTRIN) 200 MG tablet Take 3 tablets (600 mg total) by mouth 3 (three) times daily. 30 tablet 0  . losartan (COZAAR) 100 MG tablet Take 1 tablet (100 mg total) by mouth daily. 90 tablet 3  . LYRICA 150 MG capsule Take 1 capsule by mouth 2 (two) times daily.    . naproxen sodium (ANAPROX) 220 MG tablet Take 220 mg by mouth 2 (two) times daily as needed (pain).    . Omega-3 Fatty Acids (FISH OIL) 1000 MG CAPS Take by mouth.    Marland Kitchen omeprazole (PRILOSEC) 20 MG capsule Take 20 mg by mouth every morning.     . traMADol (ULTRAM) 50 MG tablet Take 50 mg by mouth daily.    . traZODone (DESYREL) 50 MG tablet Take 100 mg by mouth at bedtime.     . vitamin E (VITAMIN E) 400 UNIT capsule Take 400 Units by mouth daily.     No current facility-administered medications for this visit.     Allergies:   Morphine and related; Tylenol [acetaminophen]; Fluoxetine hcl; Lisinopril; Tramadol; Statins; and Hydrocodone    Social History:  The patient  reports that she has been smoking cigarettes.  She  has a 10.00 pack-year smoking history. she has never used smokeless tobacco. She reports that she drinks alcohol. She reports that she does not use drugs.   Family History:  The patient's family history includes Cancer in her father and paternal grandmother; Colon cancer (age of onset: 21) in her father.    ROS:  Please see the history of present illness.   Otherwise, review of systems are positive for none.   All other systems  are reviewed and negative.    PHYSICAL EXAM: VS:  BP 116/70   Pulse 68   Ht 5\' 4"  (1.626 m)   Wt 110 lb 12.8 oz (50.3 kg)   LMP 10/23/2002   SpO2 95%   BMI 19.02 kg/m  , BMI Body mass index is 19.02 kg/m. GEN: Well nourished, well developed, in no acute distress  HEENT: normal  Neck: no JVD, carotid bruits, or masses Cardiac: RRR; no murmurs, rubs, or gallops,no edema  Respiratory:  clear to auscultation bilaterally, normal work of breathing GI: soft, nontender, nondistended, + BS MS: no deformity or atrophy  Skin: warm and dry, no rash Neuro:  Strength and sensation are intact Psych: euthymic mood, full affect   EKG:  EKG is  ordered today. EKG showed normal sinus rhythm with incomplete right bundle branch block and normal QT interval.    Recent Labs: 01/04/2017: ALT 16; BUN 10; Creatinine, Ser 0.61; Hemoglobin 13.2; Platelets 239; Potassium 3.8; Sodium 140    Lipid Panel    Component Value Date/Time   CHOL 281 (H) 11/20/2016 1148   TRIG 234 (H) 11/20/2016 1148   HDL 60 11/20/2016 1148   CHOLHDL 4.7 11/20/2016 1148   VLDL 47 (H) 11/20/2016 1148   LDLCALC 174 (H) 11/20/2016 1148   LDLDIRECT 136 (H) 04/08/2012 1125      Wt Readings from Last 3 Encounters:  08/06/17 110 lb 12.8 oz (50.3 kg)  02/27/17 117 lb (53.1 kg)  02/13/17 117 lb (53.1 kg)        ASSESSMENT AND PLAN:  1.  Left vertebral artery occlusion: This was noted incidentally on recent MRI.  I requested carotid Doppler for further evaluation.  Continue low-dose aspirin and  treatment of risk factors.  2. Essential hypertension: Blood pressure is now well controlled on current dose of losartan 100 mg once daily.  3. Hyperlipidemia with intolerance to atorvastatin and rosuvastatin due to severe myalgia.  Continue treatment with Zetia.  LDL improved to 85.  Disposition:   FU with me in 12 months  Signed,  Kathlyn Sacramento, MD  08/06/2017 12:07 PM    [place]

## 2017-08-13 ENCOUNTER — Encounter (HOSPITAL_COMMUNITY): Payer: Medicare HMO

## 2017-08-23 ENCOUNTER — Ambulatory Visit (HOSPITAL_COMMUNITY)
Admission: RE | Admit: 2017-08-23 | Discharge: 2017-08-23 | Disposition: A | Discharge status: Home / Self Care | Payer: Medicare HMO | Source: Ambulatory Visit | Attending: Cardiovascular Disease | Admitting: Cardiovascular Disease

## 2017-08-23 DIAGNOSIS — I779 Disorder of arteries and arterioles, unspecified: Secondary | ICD-10-CM

## 2017-08-23 DIAGNOSIS — I739 Peripheral vascular disease, unspecified: Secondary | ICD-10-CM

## 2017-08-29 ENCOUNTER — Telehealth: Payer: Self-pay | Admitting: Cardiovascular Disease

## 2017-08-29 NOTE — Telephone Encounter (Signed)
Returned the call to the patient. She was calling for the results of her Carotids. Will route to the provider for his knowledge.

## 2017-08-29 NOTE — Telephone Encounter (Signed)
Message left with the patient to call back for results, per dpr.  Notes recorded by Wellington Hampshire, MD on 08/29/2017 at 12:57 PM EST Carotid Doppler showed only mild nonobstructive carotid disease. Left vertebral artery is occluded but has collaterals. No intervention is needed at this time.

## 2017-08-29 NOTE — Telephone Encounter (Signed)
Patient made aware of results and verbalized her understanding.  

## 2017-08-29 NOTE — Telephone Encounter (Signed)
New message  Pt verbalized that she is calling for the rn  Results on 08/23/2017

## 2017-08-29 NOTE — Telephone Encounter (Signed)
See result note.  

## 2018-04-15 ENCOUNTER — Other Ambulatory Visit: Payer: Self-pay | Admitting: Cardiovascular Disease

## 2018-04-15 NOTE — Telephone Encounter (Signed)
Rx request sent to pharmacy.  

## 2018-04-15 NOTE — Telephone Encounter (Signed)
Please review for refill. Thanks!  

## 2018-07-08 ENCOUNTER — Emergency Department: Payer: Medicare HMO | Admitting: Anesthesiology

## 2018-07-08 ENCOUNTER — Emergency Department: Payer: Medicare HMO

## 2018-07-08 ENCOUNTER — Inpatient Hospital Stay
Admission: EM | Admit: 2018-07-08 | Discharge: 2018-07-09 | DRG: 346 | Disposition: A | Discharge status: Home / Self Care | Payer: Medicare HMO | Attending: Surgery | Admitting: Surgery

## 2018-07-08 ENCOUNTER — Encounter: Payer: Self-pay | Admitting: Emergency Medicine

## 2018-07-08 ENCOUNTER — Encounter
Admission: EM | Disposition: A | Discharge status: Home / Self Care | Payer: Self-pay | Source: Home / Self Care | Attending: Surgery

## 2018-07-08 ENCOUNTER — Other Ambulatory Visit: Payer: Self-pay

## 2018-07-08 DIAGNOSIS — Z79891 Long term (current) use of opiate analgesic: Secondary | ICD-10-CM

## 2018-07-08 DIAGNOSIS — L03115 Cellulitis of right lower limb: Secondary | ICD-10-CM

## 2018-07-08 DIAGNOSIS — Z7982 Long term (current) use of aspirin: Secondary | ICD-10-CM

## 2018-07-08 DIAGNOSIS — Z9049 Acquired absence of other specified parts of digestive tract: Secondary | ICD-10-CM

## 2018-07-08 DIAGNOSIS — I10 Essential (primary) hypertension: Secondary | ICD-10-CM | POA: Diagnosis present

## 2018-07-08 DIAGNOSIS — Z885 Allergy status to narcotic agent status: Secondary | ICD-10-CM

## 2018-07-08 DIAGNOSIS — F419 Anxiety disorder, unspecified: Secondary | ICD-10-CM | POA: Diagnosis present

## 2018-07-08 DIAGNOSIS — K610 Anal abscess: Secondary | ICD-10-CM

## 2018-07-08 DIAGNOSIS — F17210 Nicotine dependence, cigarettes, uncomplicated: Secondary | ICD-10-CM | POA: Diagnosis present

## 2018-07-08 DIAGNOSIS — G894 Chronic pain syndrome: Secondary | ICD-10-CM | POA: Diagnosis present

## 2018-07-08 DIAGNOSIS — K219 Gastro-esophageal reflux disease without esophagitis: Secondary | ICD-10-CM | POA: Diagnosis present

## 2018-07-08 DIAGNOSIS — Z791 Long term (current) use of non-steroidal anti-inflammatories (NSAID): Secondary | ICD-10-CM

## 2018-07-08 DIAGNOSIS — M4802 Spinal stenosis, cervical region: Secondary | ICD-10-CM | POA: Diagnosis present

## 2018-07-08 DIAGNOSIS — Z800 Family history of malignant neoplasm of digestive organs: Secondary | ICD-10-CM

## 2018-07-08 DIAGNOSIS — Z888 Allergy status to other drugs, medicaments and biological substances status: Secondary | ICD-10-CM

## 2018-07-08 DIAGNOSIS — L0231 Cutaneous abscess of buttock: Secondary | ICD-10-CM

## 2018-07-08 DIAGNOSIS — L988 Other specified disorders of the skin and subcutaneous tissue: Secondary | ICD-10-CM | POA: Diagnosis not present

## 2018-07-08 DIAGNOSIS — K611 Rectal abscess: Secondary | ICD-10-CM | POA: Diagnosis not present

## 2018-07-08 DIAGNOSIS — Z89029 Acquired absence of unspecified finger(s): Secondary | ICD-10-CM

## 2018-07-08 DIAGNOSIS — Z90710 Acquired absence of both cervix and uterus: Secondary | ICD-10-CM

## 2018-07-08 DIAGNOSIS — Z886 Allergy status to analgesic agent status: Secondary | ICD-10-CM

## 2018-07-08 DIAGNOSIS — K5900 Constipation, unspecified: Secondary | ICD-10-CM | POA: Diagnosis present

## 2018-07-08 DIAGNOSIS — B1920 Unspecified viral hepatitis C without hepatic coma: Secondary | ICD-10-CM | POA: Diagnosis present

## 2018-07-08 DIAGNOSIS — J449 Chronic obstructive pulmonary disease, unspecified: Secondary | ICD-10-CM | POA: Diagnosis present

## 2018-07-08 DIAGNOSIS — F329 Major depressive disorder, single episode, unspecified: Secondary | ICD-10-CM | POA: Diagnosis present

## 2018-07-08 DIAGNOSIS — Z79899 Other long term (current) drug therapy: Secondary | ICD-10-CM

## 2018-07-08 HISTORY — PX: INCISION AND DRAINAGE PERIRECTAL ABSCESS: SHX1804

## 2018-07-08 HISTORY — DX: Rectal abscess: K61.1

## 2018-07-08 LAB — BASIC METABOLIC PANEL
Anion gap: 7 (ref 5–15)
BUN: 10 mg/dL (ref 6–20)
CO2: 28 mmol/L (ref 22–32)
Calcium: 9 mg/dL (ref 8.9–10.3)
Chloride: 104 mmol/L (ref 98–111)
Creatinine, Ser: 0.51 mg/dL (ref 0.44–1.00)
GFR calc Af Amer: >60 mL/min (ref >60)
GFR calc non Af Amer: >60 mL/min (ref >60)
Glucose, Bld: 92 mg/dL (ref 70–99)
Potassium: 3.5 mmol/L (ref 3.5–5.1)
Sodium: 139 mmol/L (ref 135–145)

## 2018-07-08 LAB — CBC WITH DIFFERENTIAL/PLATELET
Abs Immature Granulocytes: 0.03 10*3/uL (ref 0.00–0.07)
Basophils Absolute: 0 10*3/uL (ref 0.0–0.1)
Basophils Relative: 1 %
Eosinophils Absolute: 0.1 10*3/uL (ref 0.0–0.5)
Eosinophils Relative: 2 %
HCT: 38.4 % (ref 36.0–46.0)
Hemoglobin: 12.8 g/dL (ref 12.0–15.0)
Immature Granulocytes: 0 %
Lymphocytes Relative: 18 %
Lymphs Abs: 1.3 10*3/uL (ref 0.7–4.0)
MCH: 32.2 pg (ref 26.0–34.0)
MCHC: 33.3 g/dL (ref 30.0–36.0)
MCV: 96.7 fL (ref 80.0–100.0)
Monocytes Absolute: 0.6 10*3/uL (ref 0.1–1.0)
Monocytes Relative: 8 %
Neutro Abs: 5 10*3/uL (ref 1.7–7.7)
Neutrophils Relative %: 71 %
Platelets: 290 10*3/uL (ref 150–400)
RBC: 3.97 MIL/uL (ref 3.87–5.11)
RDW: 12.4 % (ref 11.5–15.5)
WBC: 7 10*3/uL (ref 4.0–10.5)
nRBC: 0 % (ref 0.0–0.2)

## 2018-07-08 SURGERY — EXAM UNDER ANESTHESIA
Anesthesia: General | Site: Buttocks

## 2018-07-08 MED ORDER — PIPERACILLIN-TAZOBACTAM 3.375 G IVPB 30 MIN
3.3750 g | Freq: Once | INTRAVENOUS | Status: AC
  Administered 2018-07-08: 3.375 g via INTRAVENOUS
  Filled 2018-07-08: qty 50

## 2018-07-08 MED ORDER — IBUPROFEN 400 MG PO TABS
600.0000 mg | ORAL_TABLET | Freq: Three times a day (TID) | ORAL | Status: DC | PRN
  Administered 2018-07-08: 600 mg via ORAL
  Filled 2018-07-08: qty 2

## 2018-07-08 MED ORDER — PROPOFOL 10 MG/ML IV BOLUS
INTRAVENOUS | Status: DC | PRN
  Administered 2018-07-08: 100 mg via INTRAVENOUS

## 2018-07-08 MED ORDER — ONDANSETRON HCL 4 MG/2ML IJ SOLN
4.0000 mg | Freq: Once | INTRAMUSCULAR | Status: AC | PRN
  Administered 2018-07-08: 4 mg via INTRAVENOUS

## 2018-07-08 MED ORDER — LIDOCAINE HCL (CARDIAC) PF 100 MG/5ML IV SOSY
PREFILLED_SYRINGE | INTRAVENOUS | Status: DC | PRN
  Administered 2018-07-08: 60 mg via INTRAVENOUS

## 2018-07-08 MED ORDER — FENTANYL CITRATE (PF) 100 MCG/2ML IJ SOLN
INTRAMUSCULAR | Status: AC
  Administered 2018-07-08: 25 ug via INTRAVENOUS
  Filled 2018-07-08: qty 2

## 2018-07-08 MED ORDER — SUGAMMADEX SODIUM 200 MG/2ML IV SOLN
INTRAVENOUS | Status: AC
  Filled 2018-07-08: qty 2

## 2018-07-08 MED ORDER — ROCURONIUM BROMIDE 50 MG/5ML IV SOLN
INTRAVENOUS | Status: AC
  Filled 2018-07-08: qty 1

## 2018-07-08 MED ORDER — EZETIMIBE 10 MG PO TABS
10.0000 mg | ORAL_TABLET | Freq: Every day | ORAL | Status: DC
  Administered 2018-07-08 – 2018-07-09 (×2): 10 mg via ORAL
  Filled 2018-07-08 (×2): qty 1

## 2018-07-08 MED ORDER — MIDAZOLAM HCL 5 MG/5ML IJ SOLN
INTRAMUSCULAR | Status: DC | PRN
  Administered 2018-07-08: 2 mg via INTRAVENOUS

## 2018-07-08 MED ORDER — ONDANSETRON HCL 4 MG/2ML IJ SOLN
INTRAMUSCULAR | Status: DC | PRN
  Administered 2018-07-08: 4 mg via INTRAVENOUS

## 2018-07-08 MED ORDER — PIPERACILLIN-TAZOBACTAM IN DEX 2-0.25 GM/50ML IV SOLN
2.2500 g | Freq: Once | INTRAVENOUS | Status: DC
Start: 2018-07-08 — End: 2018-07-08

## 2018-07-08 MED ORDER — IOPAMIDOL (ISOVUE-300) INJECTION 61%
75.0000 mL | Freq: Once | INTRAVENOUS | Status: DC | PRN
  Filled 2018-07-08: qty 75

## 2018-07-08 MED ORDER — PANTOPRAZOLE SODIUM 40 MG PO TBEC
40.0000 mg | DELAYED_RELEASE_TABLET | Freq: Every day | ORAL | Status: DC
  Administered 2018-07-09: 40 mg via ORAL
  Filled 2018-07-08: qty 1

## 2018-07-08 MED ORDER — LOSARTAN POTASSIUM 50 MG PO TABS
100.0000 mg | ORAL_TABLET | Freq: Every day | ORAL | Status: DC
  Administered 2018-07-09: 100 mg via ORAL
  Filled 2018-07-08: qty 2

## 2018-07-08 MED ORDER — PROPOFOL 10 MG/ML IV BOLUS
INTRAVENOUS | Status: AC
  Filled 2018-07-08: qty 20

## 2018-07-08 MED ORDER — NAPROXEN 500 MG PO TABS: 500.0000 mg | ORAL_TABLET | Freq: Two times a day (BID) | ORAL | Status: DC

## 2018-07-08 MED ORDER — ROCURONIUM BROMIDE 100 MG/10ML IV SOLN
INTRAVENOUS | Status: DC | PRN
  Administered 2018-07-08: 5 mg via INTRAVENOUS

## 2018-07-08 MED ORDER — PREGABALIN 75 MG PO CAPS
150.0000 mg | ORAL_CAPSULE | Freq: Two times a day (BID) | ORAL | Status: DC
  Administered 2018-07-08 – 2018-07-09 (×2): 150 mg via ORAL
  Filled 2018-07-08 (×2): qty 2

## 2018-07-08 MED ORDER — FENTANYL CITRATE (PF) 100 MCG/2ML IJ SOLN
INTRAMUSCULAR | Status: DC | PRN
  Administered 2018-07-08 (×2): 50 ug via INTRAVENOUS

## 2018-07-08 MED ORDER — SULFAMETHOXAZOLE-TRIMETHOPRIM 800-160 MG PO TABS: 1.0000 | ORAL_TABLET | Freq: Two times a day (BID) | ORAL | 0 refills | Status: DC

## 2018-07-08 MED ORDER — CLONAZEPAM 1 MG PO TABS
1.0000 mg | ORAL_TABLET | Freq: Every day | ORAL | Status: DC
  Administered 2018-07-08: 1 mg via ORAL
  Filled 2018-07-08: qty 1

## 2018-07-08 MED ORDER — MUPIROCIN 2 % EX OINT: TOPICAL_OINTMENT | CUTANEOUS | 0 refills | Status: DC

## 2018-07-08 MED ORDER — FENTANYL CITRATE (PF) 100 MCG/2ML IJ SOLN
INTRAMUSCULAR | Status: AC
  Filled 2018-07-08: qty 2

## 2018-07-08 MED ORDER — HYDROGEN PEROXIDE 3 % EX SOLN
CUTANEOUS | Status: DC | PRN
  Administered 2018-07-08: 1

## 2018-07-08 MED ORDER — ONDANSETRON HCL 4 MG/2ML IJ SOLN
INTRAMUSCULAR | Status: AC
  Filled 2018-07-08: qty 2

## 2018-07-08 MED ORDER — FENTANYL CITRATE (PF) 100 MCG/2ML IJ SOLN
25.0000 ug | INTRAMUSCULAR | Status: AC | PRN
  Administered 2018-07-08 (×6): 25 ug via INTRAVENOUS

## 2018-07-08 MED ORDER — DEXAMETHASONE SODIUM PHOSPHATE 10 MG/ML IJ SOLN
INTRAMUSCULAR | Status: AC
  Filled 2018-07-08: qty 1

## 2018-07-08 MED ORDER — ALBUTEROL SULFATE (2.5 MG/3ML) 0.083% IN NEBU: 2.5000 mg | INHALATION_SOLUTION | Freq: Four times a day (QID) | RESPIRATORY_TRACT | Status: DC | PRN

## 2018-07-08 MED ORDER — ENOXAPARIN SODIUM 40 MG/0.4ML ~~LOC~~ SOLN: 40.0000 mg | SUBCUTANEOUS | Status: DC

## 2018-07-08 MED ORDER — IOHEXOL 300 MG/ML  SOLN
75.0000 mL | Freq: Once | INTRAMUSCULAR | Status: AC | PRN
  Administered 2018-07-08: 75 mL via INTRAVENOUS
  Filled 2018-07-08: qty 75

## 2018-07-08 MED ORDER — SODIUM CHLORIDE 0.9 % IV SOLN
INTRAVENOUS | Status: DC | PRN
  Administered 2018-07-08: 18:00:00 via INTRAVENOUS

## 2018-07-08 MED ORDER — DEXAMETHASONE SODIUM PHOSPHATE 10 MG/ML IJ SOLN
INTRAMUSCULAR | Status: DC | PRN
  Administered 2018-07-08: 5 mg via INTRAVENOUS

## 2018-07-08 MED ORDER — MIDAZOLAM HCL 2 MG/2ML IJ SOLN
INTRAMUSCULAR | Status: AC
  Filled 2018-07-08: qty 2

## 2018-07-08 MED ORDER — LIDOCAINE-EPINEPHRINE 1 %-1:100000 IJ SOLN
INTRAMUSCULAR | Status: DC | PRN
  Administered 2018-07-08: 30 mL

## 2018-07-08 MED ORDER — BUPIVACAINE HCL 0.5 % IJ SOLN
INTRAMUSCULAR | Status: DC | PRN
  Administered 2018-07-08: 30 mL

## 2018-07-08 MED ORDER — TRAMADOL HCL 50 MG PO TABS
50.0000 mg | ORAL_TABLET | Freq: Every day | ORAL | Status: DC | PRN
  Administered 2018-07-09: 50 mg via ORAL
  Filled 2018-07-08: qty 1

## 2018-07-08 MED ORDER — LIDOCAINE HCL (PF) 2 % IJ SOLN
INTRAMUSCULAR | Status: AC
  Filled 2018-07-08: qty 10

## 2018-07-08 SURGICAL SUPPLY — 33 items
BLADE SURG 15 STRL LF DISP TIS (BLADE) ×1 IMPLANT
BLADE SURG 15 STRL SS (BLADE) ×1
BLADE SURG SZ11 CARB STEEL (BLADE) ×2 IMPLANT
BNDG GAUZE 4.5X4.1 6PLY STRL (MISCELLANEOUS) IMPLANT
BRIEF STRETCH MATERNITY 2XLG (MISCELLANEOUS) ×2 IMPLANT
CANISTER SUCT 1200ML W/VALVE (MISCELLANEOUS) ×2 IMPLANT
COVER WAND RF STERILE (DRAPES) ×2 IMPLANT
DRAIN PENROSE 1/4X12 LTX (DRAIN) IMPLANT
DRAIN PENROSE 5/8X12 LTX STRL (DRAIN) IMPLANT
DRAPE LAPAROTOMY 77X122 PED (DRAPES) ×2 IMPLANT
DRAPE PERI LITHO V/GYN (MISCELLANEOUS) ×2 IMPLANT
DRSG TELFA 4X3 1S NADH ST (GAUZE/BANDAGES/DRESSINGS) ×2 IMPLANT
ELECT REM PT RETURN 9FT ADLT (ELECTROSURGICAL) ×2
ELECTRODE REM PT RTRN 9FT ADLT (ELECTROSURGICAL) ×1 IMPLANT
GAUZE SPONGE 4X4 12PLY STRL (GAUZE/BANDAGES/DRESSINGS) ×4 IMPLANT
GLOVE INDICATOR 7.0 STRL GRN (GLOVE) ×2 IMPLANT
GLOVE SURG SYN 7.0 (GLOVE) ×2 IMPLANT
KIT TURNOVER CYSTO (KITS) ×2 IMPLANT
LABEL OR SOLS (LABEL) ×2 IMPLANT
NDL SAFETY ECLIPSE 18X1.5 (NEEDLE) ×1 IMPLANT
NEEDLE HYPO 18GX1.5 SHARP (NEEDLE) ×1
NEEDLE HYPO 25X1 1.5 SAFETY (NEEDLE) ×2 IMPLANT
NS IRRIG 500ML POUR BTL (IV SOLUTION) ×2 IMPLANT
PACK BASIN MINOR ARMC (MISCELLANEOUS) ×2 IMPLANT
PAD ABD DERMACEA PRESS 5X9 (GAUZE/BANDAGES/DRESSINGS) ×4 IMPLANT
PAD PREP 24X41 OB/GYN DISP (PERSONAL CARE ITEMS) ×2 IMPLANT
SCRUB POVIDONE IODINE 4 OZ (MISCELLANEOUS) ×2 IMPLANT
SPONGE LAP 18X18 RF (DISPOSABLE) IMPLANT
SURGILUBE 2OZ TUBE FLIPTOP (MISCELLANEOUS) IMPLANT
SUT CHROMIC 3 0 SH 27 (SUTURE) IMPLANT
SYR 10ML LL (SYRINGE) ×4 IMPLANT
SYR BULB IRRIG 60ML STRL (SYRINGE) ×2 IMPLANT
TOWEL OR 17X26 4PK STRL BLUE (TOWEL DISPOSABLE) ×2 IMPLANT

## 2018-07-08 NOTE — ED Notes (Signed)
This RN present as chaperone/assist as needed for [facility] MD during rectal exam. Pt tolerated well.

## 2018-07-08 NOTE — ED Notes (Signed)
See triage note  States she is here for possible abscess area to buttocks   No redness noted to buttocks  But area is sore to touch  Noticed red  Swollen area to groin   Red and tender

## 2018-07-08 NOTE — Consult Note (Signed)
Subjective:   CC: perirectal abscess  HPI:  Amanda Bennett is a 62 y.o. female who was consulted by Tamala Julian for evaluation of  above. First noted a few weeks ago.  Symptoms include: Pain is sharp, non-radiating.  Exacerbated by nothing specific.  Alleviated by nothing specific.  Associated with constipation, but pain is not inhibiting her from having BMs perse.  Denies overlying skin changes, drainage from area.  Last cscope this year and was noted to be negative.  Recommended f/u in 54yrs due to father hx of colon CA.     Past Medical History:  has a past medical history of Anxiety, Arthritis pain, Asthma, Bruises easily, Cancer ([place]), Carpal tunnel syndrome, Cervical spinal stenosis (2008), Cervical syndrome, Cervicalgia, Chronic pain syndrome, COPD (chronic obstructive pulmonary disease) ([place]), Cough, Depression, Dysrhythmia, Family history of adverse reaction to anesthesia, Foot fracture, right (07/2010), GERD (gastroesophageal reflux disease), Headache, Hepatitis, Hypertension, Insomnia, Lesion of ulnar nerve, Lumbago, Osteoporosis, Pain in limb, PONV (postoperative nausea and vomiting), Primary localized osteoarthrosis, pelvic region and thigh, Pulmonary nodule, right, Shortness of breath dyspnea, Unexplained weight loss, Visual disturbance, Wears contact lenses, Wears glasses, and Wound disruption, post-op, skin.  Past Surgical History:  has a past surgical history that includes Abdominal hysterectomy; finger amputated; drain in left ring finger; broken clavical; Hemorrhoid surgery (11/25/2006); Carpal tunnel release; Colonoscopy; Inguinal lymph node biopsy (Right, 02/03/2014); Shoulder arthroscopy (Left, 07/20/2015); Dorsal compartment release (Left, 07/20/2015); Cholecystectomy; Cholecystectomy (10/30/2011); and Shoulder arthroscopy (Left, 01/12/2016).  Family History: family history includes Cancer in her father and paternal grandmother; Colon cancer (age of onset: 54) in her father.  Social  History:  reports that she has been smoking cigarettes. She has a 10.00 pack-year smoking history. She has never used smokeless tobacco. She reports that she drinks alcohol. She reports that she does not use drugs.  Current Medications:  (Not in a hospital admission)  Allergies:  Allergies  Allergen Reactions  . Morphine And Related Shortness Of Breath, Itching and Nausea Only  . Tylenol [Acetaminophen] Other (See Comments)    Liver Failure   . Fluoxetine Hcl Other (See Comments)    Suicidal   . Lisinopril Cough  . Tramadol     Nervous, anxiety  . Statins     Severe myalgia with Atorvastatin and Rosuvastatin  . Hydrocodone Nausea Only and Rash    Shaking, nervous    ROS:  A 15 point review of systems was performed and pertinent positives and negatives noted in HPI   Objective:     BP 106/87 (BP Location: Left Arm)   Pulse 83   Temp 97.7 F (36.5 C) (Oral)   Resp 16   Ht 5\' 4"  (1.626 m)   Wt 45.8 kg   LMP 10/23/2002   SpO2 100%   BMI 17.34 kg/m   Constitutional :  alert, cooperative, appears stated age and no distress, thin.  Lymphatics/Throat:  no asymmetry, masses, or scars  Respiratory:  clear to auscultation bilaterally  Cardiovascular:  regular rate and rhythm  Gastrointestinal: soft, non-tender; bowel sounds normal; no masses,  no organomegaly.  Musculoskeletal: Steady gait and movement  Skin: Cool and moist,   Psychiatric: Normal affect, non-agitated, not confused  Rectal: Chaperone present for exam.  External exam with palpable hard nodule in right lateral aspect with no overlying skin changens.  Nodule is deep to skin and tender to palpation.  DRE deferred due to pt discomfort.  No obvious discharge noted from skin or from inside rectum  LABS:  CMP Latest Ref Rng & Units 07/08/2018 01/04/2017 11/20/2016  Glucose 70 - 99 mg/dL 92 99 86  BUN 6 - 20 mg/dL 10 10 15   Creatinine 0.44 - 1.00 mg/dL 0.51 0.61 0.64  Sodium 135 - 145 mmol/L 139 140 137  Potassium  3.5 - 5.1 mmol/L 3.5 3.8 4.3  Chloride 98 - 111 mmol/L 104 106 101  CO2 22 - 32 mmol/L 28 24 24   Calcium 8.9 - 10.3 mg/dL 9.0 9.7 9.4  Total Protein 6.5 - 8.1 g/dL - 6.9 7.0  Total Bilirubin 0.3 - 1.2 mg/dL - 0.4 0.4  Alkaline Phos 38 - 126 U/L - 71 73  AST 15 - 41 U/L - 24 18  ALT 14 - 54 U/L - 16 11   CBC Latest Ref Rng & Units 07/08/2018 01/04/2017 08/08/2015  WBC 4.0 - 10.5 K/uL 7.0 9.0 9.5  Hemoglobin 12.0 - 15.0 g/dL 12.8 13.2 14.3  Hematocrit 36.0 - 46.0 % 38.4 38.8 40.4  Platelets 150 - 400 K/uL 290 239 255    RADS: CLINICAL DATA:  Pain and swelling in the right buttock for several days  EXAM: CT PELVIS WITH CONTRAST  TECHNIQUE: Multidetector CT imaging of the pelvis was performed using the standard protocol following the bolus administration of intravenous contrast.  CONTRAST:  35mL OMNIPAQUE IOHEXOL 300 MG/ML  SOLN  COMPARISON:  None.  FINDINGS: Urinary Tract: Bladder is partially distended. The visualized portion of the kidneys are within normal limits.  Bowel: No obstructive or inflammatory changes of the bowel are noted.  Vascular/Lymphatic: No significant lymphadenopathy is noted. No significant vascular changes are seen.  Reproductive: Uterus has been surgically removed. No adnexal mass is noted  Other: Some soft tissue swelling is noted in the region of the right buttock. Specifically in the right perianal region there is an irregularly-shaped predominately enhancing lesion seen with a few small foci of fluid within. The largest of these measures approximately 13 mm in greatest dimension. These changes are consistent with a perianal abscess.  Musculoskeletal: Mild degenerative changes of lumbar spine are noted. No acute bony abnormality is seen.  IMPRESSION: Irregularly enhancing structure adjacent to the anus with a small fluid component consistent with a perianal abscess. The fluid component is significantly lateral from the skin  surface.   Electronically Signed   By: Inez Catalina M.D.   On: 07/08/2018 13:32  Assessment:      Perirectal abscess  Plan:     1. To OR for EUA, I&D due to location and depth of lesion.  Alternatives include continued observation.  Benefits include possible symptom relief, pathologic evaluation.  Discussed the risk of surgery including recurrence, chronic pain, post-op infxn, poor cosmesis, poor/delayed wound healing, and possible re-operation to address said risks. The risks of general anesthetic, if used, includes MI, CVA, sudden death or even reaction to anesthetic medications also discussed.  Typical post-op recovery time of 3-5 days with possible activity restrictions were also discussed.  The patient verbalized understanding and all questions were answered to the patient's satisfaction.  Pending OR time.  Zosyn requested to be started in the meantime.

## 2018-07-08 NOTE — Anesthesia Post-op Follow-up Note (Signed)
Anesthesia QCDR form completed.        

## 2018-07-08 NOTE — Transfer of Care (Signed)
Immediate Anesthesia Transfer of Care Note  Patient: UNNAMED HINO  Procedure(s) Performed: EXAM UNDER ANESTHESIA (N/A Buttocks) IRRIGATION AND DEBRIDEMENT PERIRECTAL ABSCESS (N/A Buttocks)  Patient Location: PACU  Anesthesia Type:General  Level of Consciousness: awake, alert  and oriented  Airway & Oxygen Therapy: Patient Spontanous Breathing and Patient connected to face mask oxygen  Post-op Assessment: Report given to RN and Post -op Vital signs reviewed and stable  Post vital signs: Reviewed and stable  Last Vitals:  Vitals Value Taken Time  BP    Temp    Pulse    Resp    SpO2      Last Pain:  Vitals:   07/08/18 1052  TempSrc:   PainSc: 10-Worst pain ever         Complications: No apparent anesthesia complications

## 2018-07-08 NOTE — ED Provider Notes (Signed)
[facility] Emergency Department Provider Note   ____________________________________________   First MD Initiated Contact with Patient 07/08/18 1117     (approximate)  I have reviewed the triage vital signs and the nursing notes.   HISTORY  Chief Complaint Abscess    HPI Amanda Bennett is a 62 y.o. female patient [facility] secondary to lesion on the right buttocks for 3 to 4 days.  Patient is a hard and not on her left buttocks.  Patient denies any discharge.  Patient denies fever.  Patient rates the pain as a 10/10.  Patient described the pain is "achy".  Past Medical History:  Diagnosis Date  . Anxiety   . Arthritis pain   . Asthma   . Bruises easily   . Cancer (HCC)    BASAL CELL  . Carpal tunnel syndrome   . Cervical spinal stenosis 2008   per x-ray findings 2008  . Cervical syndrome   . Cervicalgia   . Chronic pain syndrome   . COPD (chronic obstructive pulmonary disease) ([place])   . Cough   . Depression   . Dysrhythmia    prolonged QT  . Family history of adverse reaction to anesthesia    PTS MOM GETS SICK  . Foot fracture, right 07/2010   per x-ray 07/2010 - oblique comminuted fifth metatarsal fracture, followed by Dr. Lorelei Pont  . GERD (gastroesophageal reflux disease)   . Headache    MIGRAINES  . Hepatitis    hx of Hep C -PT STATES SHE RECEIVED TREATMENT AND NO LONGER HAS HEP C  . Hypertension   . Insomnia   . Lesion of ulnar nerve   . Lumbago   . Osteoporosis   . Pain in limb   . PONV (postoperative nausea and vomiting)    shortness of breath, nausea  . Primary localized osteoarthrosis, pelvic region and thigh   . Pulmonary nodule, right    RUL nodule, 7 mm noted on Chest XRAY - 06/2008, per CT follow up in 2009 - No suspicious pulmonary nodules or masse noted  . Shortness of breath dyspnea    ONLY WHEN WAKING UP IN AM-PT USES ALBUTEROL INHALER AND THAT SEEMS TO HELP  . Unexplained weight loss   .  Visual disturbance   . Wears contact lenses   . Wears glasses   . Wound disruption, post-op, skin     Patient Active Problem List   Diagnosis Date Noted  . Palpitations 10/01/2014  . Tobacco abuse 07/23/2014  . Mouth lesion 07/23/2014  . Sore throat 07/23/2014  . Postoperative wound infection 02/13/2014  . Inguinal lymphadenopathy 12/30/2013  . Family history of long QT syndrome 09/18/2013  . Chest pain on exertion 09/18/2013  . Rash and nonspecific skin eruption 09/18/2013  . Leg pain 09/18/2013  . Right groin pain 06/18/2013  . Preventative health care 06/18/2013  . CAFL (chronic airflow limitation) ([place]) 04/10/2013  . BP (high blood pressure) 04/10/2013  . Closed traumatic PIP dislocation 04/07/2013  . Abnormality of gait 12/16/2012  . Asthma 08/21/2012  . Myofascial pain dysfunction syndrome 06/25/2012  . Bursitis of hip, right 04/09/2012  . Hypertension 04/09/2012  . Headache(784.0) 04/09/2012  . Hyperlipidemia 04/09/2012  . Low back pain 12/26/2011  . Lumbar spondylosis 12/26/2011  . Localized primary carpometacarpal osteoarthritis 11/07/2011  . Chronic neck pain 11/07/2011  . Chronic calculus cholecystitis 10/03/2011  . Skin lesion of right arm 10/02/2011  . Osteoporosis 10/02/2011  . Gallstone  10/02/2011  . Hot flash, menopausal 01/17/2011  . History of hepatitis C 06/15/2010  . CARPAL TUNNEL SYNDROME, BILATERAL 12/06/2009  . GERD 06/22/2009  . Depression with anxiety 05/25/2009  . [place] DISEASE, CERVICAL 05/25/2009  . Insomnia 05/25/2009    Past Surgical History:  Procedure Laterality Date  . ABDOMINAL HYSTERECTOMY    . broken clavical    . CARPAL TUNNEL RELEASE     right and left  . CHOLECYSTECTOMY    . CHOLECYSTECTOMY  10/30/2011   Procedure: LAPAROSCOPIC CHOLECYSTECTOMY WITH INTRAOPERATIVE CHOLANGIOGRAM;  Surgeon: Imogene Burn. Georgette Dover, MD;  Location: [facility];  Service: General;  Laterality: N/A;  . COLONOSCOPY    . DORSAL COMPARTMENT RELEASE Left  07/20/2015   Procedure: RELEASE DORSAL COMPARTMENT (DEQUERVAIN);  Surgeon: Corky Mull, MD;  Location: [facility];  Service: Orthopedics;  Laterality: Left;  . drain in left ring finger     cat bite  . finger amputated     right ring  . HEMORRHOID SURGERY  11/25/2006  . INGUINAL LYMPH NODE BIOPSY Right 02/03/2014   Procedure: INGUINAL LYMPH NODE BIOPSY;  Surgeon: Zenovia Jarred, MD;  Location: [facility];  Service: General;  Laterality: Right;  right inguinal area  . SHOULDER ARTHROSCOPY Left 07/20/2015   Procedure: ARTHROSCOPY SHOULDER DEBRIDEMENT AND DECOMPRESSION;  Surgeon: Corky Mull, MD;  Location: [facility];  Service: Orthopedics;  Laterality: Left;  . SHOULDER ARTHROSCOPY Left 01/12/2016   Procedure: ARTHROSCOPY SHOULDER WITH DEBRIDEMENT AND EXCISION OF THE DISTAL CLAVICLE;  Surgeon: Corky Mull, MD;  Location: [facility];  Service: Orthopedics;  Laterality: Left;    Prior to Admission medications   Medication Sig Start Date End Date Taking? Authorizing Provider  albuterol (PROVENTIL HFA;VENTOLIN HFA) 108 (90 BASE) MCG/ACT inhaler Inhale 1-2 puffs into the lungs every 6 (six) hours as needed for wheezing or shortness of breath.    [provider]  aspirin 81 MG chewable tablet Chew 81 mg by mouth daily.    [provider]  BIOTIN PO Take 500 mcg by mouth.    [provider]  clonazePAM (KLONOPIN) 0.5 MG tablet Take 0.5 mg by mouth 3 (three) times daily as needed for anxiety.  11/08/15   [provider]  Coenzyme Q10 (CO Q 10) 100 MG CAPS Take 1 capsule by mouth daily. 10/23/16   [provider]  ezetimibe (ZETIA) 10 MG tablet TAKE 1 TABLET BY MOUTH DAILY 04/15/18   Wellington Hampshire, MD  ibuprofen (ADVIL,MOTRIN) 200 MG tablet Take 3 tablets (600 mg total) by mouth 3 (three) times daily. 07/20/15   Poggi, Marshall Cork, MD  losartan (COZAAR) 100 MG tablet Take 1 tablet (100 mg total) by mouth daily. 01/08/17   Wellington Hampshire, MD  LYRICA 150 MG capsule Take 1 capsule by mouth 2 (two) times daily. 01/07/17   [provider]  mupirocin ointment (BACTROBAN) 2 % Apply to affected area 3 times daily 07/08/18 07/08/19  Sable Feil, PA-C  naproxen (NAPROSYN) 500 MG tablet Take 1 tablet (500 mg total) by mouth 2 (two) times daily with a meal. 07/08/18   Sable Feil, PA-C  naproxen sodium (ANAPROX) 220 MG tablet Take 220 mg by mouth 2 (two) times daily as needed (pain).    [provider]  Omega-3 Fatty Acids (FISH OIL) 1000 MG CAPS Take by mouth.    [provider]  omeprazole (PRILOSEC) 20 MG capsule Take 20 mg by mouth every morning.  [provider]  sulfamethoxazole-trimethoprim (BACTRIM DS,SEPTRA DS) 800-160 MG tablet Take 1 tablet by mouth 2 (two) times daily. 07/08/18   Sable Feil, PA-C  traMADol (ULTRAM) 50 MG tablet Take 50 mg by mouth daily. 07/22/17   [provider]  traZODone (DESYREL) 50 MG tablet Take 100 mg by mouth at bedtime.     [provider]  vitamin E (VITAMIN E) 400 UNIT capsule Take 400 Units by mouth daily.    [provider]    Allergies Morphine and related; Tylenol [acetaminophen]; Fluoxetine hcl; Lisinopril; Tramadol; Statins; and Hydrocodone  Family History  Problem Relation Age of Onset  . Cancer Father        colon/prostate  . Colon cancer Father 92       deceased at age 63 from colon cancer  . Cancer Paternal Grandmother        ovarian  . Stroke Neg Hx   . Stomach cancer Neg Hx     Social History Social History   Tobacco Use  . Smoking status: Current Some Day Smoker    Packs/day: 0.50    Years: 20.00    Pack years: 10.00    Types: Cigarettes    Last attempt to quit: 10/24/2014    Years since quitting: 3.7  . Smokeless tobacco: Never Used  Substance Use Topics  . Alcohol use: Yes    Alcohol/week: 0.0 standard drinks    Comment: OCC BEER  . Drug use: No    Review of  Systems Constitutional: No fever/chills Eyes: No visual changes. ENT: No sore throat. Cardiovascular: Denies chest pain. Respiratory: Denies shortness of breath. Gastrointestinal: No abdominal pain.  No nausea, no vomiting.  No diarrhea.  No constipation. Genitourinary: Negative for dysuria. Musculoskeletal: Negative for back pain. Skin: Nodule lesion perirectal area Neurological: Negative for headaches, focal weakness or numbness. Psychiatric:Anxiety and depression. Endocrine:Hypertension and hepatitis C. Hematological/Lymphatic: Allergic/Immunilogical: See medication list.  ____________________________________________   PHYSICAL EXAM:  VITAL SIGNS: ED Triage Vitals  Enc Vitals Group     BP 07/08/18 1045 106/87     Pulse Rate 07/08/18 1045 83     Resp 07/08/18 1045 16     Temp 07/08/18 1045 97.7 F (36.5 C)     Temp Source 07/08/18 1045 Oral     SpO2 07/08/18 1045 100 %     Weight 07/08/18 1045 101 lb (45.8 kg)     Height 07/08/18 1045 5\' 4"  (1.626 m)     Head Circumference --      Peak Flow --      Pain Score 07/08/18 1052 10     Pain Loc --      Pain Edu? --      Excl. in [place]? --     Constitutional: Alert and oriented. Well appearing and in no acute distress. Cardiovascular: Normal rate, regular rhythm. Grossly normal heart sounds.  Good peripheral circulation. Respiratory: Normal respiratory effort.  No retractions. Lungs CTAB. Gastrointestinal: Soft and nontender. No distention. No abdominal bruits. No CVA tenderness.   Musculoskeletal: No lower extremity tenderness nor edema.  No joint effusions. Neurologic:  Normal speech and language. No gross focal neurologic deficits are appreciated. No gait instability. Skin:  Skin is warm, dry and intact. No rash noted.  Erythema and edema to right lower leg.  Palpable tender nodule lesion right perirectal area. Psychiatric: Mood and affect are normal. Speech and behavior are  normal.  ____________________________________________   LABS (all labs ordered are listed, but  only abnormal results are displayed)  Labs Reviewed  BASIC METABOLIC PANEL  CBC WITH DIFFERENTIAL/PLATELET   ____________________________________________  EKG   ____________________________________________  RADIOLOGY  ED MD interpretation:    Official radiology report(s): Dg Tibia/fibula Right  Result Date: 07/08/2018 CLINICAL DATA:  Pain EXAM: RIGHT TIBIA AND FIBULA - 2 VIEW COMPARISON:  Right knee radiographs September 12, 2013 FINDINGS: Frontal and lateral views were obtained. There is no fracture or dislocation. No abnormal periosteal reaction. Joint spaces appear unremarkable. IMPRESSION: No fracture or dislocation.  No appreciable arthropathy. Electronically Signed   By: Lowella Grip III M.D.   On: 07/08/2018 12:02   Ct Pelvis W Contrast  Result Date: 07/08/2018 CLINICAL DATA:  Pain and swelling in the right buttock for several days EXAM: CT PELVIS WITH CONTRAST TECHNIQUE: Multidetector CT imaging of the pelvis was performed using the standard protocol following the bolus administration of intravenous contrast. CONTRAST:  32mL OMNIPAQUE IOHEXOL 300 MG/ML  SOLN COMPARISON:  None. FINDINGS: Urinary Tract: Bladder is partially distended. The visualized portion of the kidneys are within normal limits. Bowel: No obstructive or inflammatory changes of the bowel are noted. Vascular/Lymphatic: No significant lymphadenopathy is noted. No significant vascular changes are seen. Reproductive: Uterus has been surgically removed. No adnexal mass is noted Other: Some soft tissue swelling is noted in the region of the right buttock. Specifically in the right perianal region there is an irregularly-shaped predominately enhancing lesion seen with a few small foci of fluid within. The largest of these measures approximately 13 mm in greatest dimension. These changes are consistent with a perianal  abscess. Musculoskeletal: Mild degenerative changes of lumbar spine are noted. No acute bony abnormality is seen. IMPRESSION: Irregularly enhancing structure adjacent to the anus with a small fluid component consistent with a perianal abscess. The fluid component is significantly lateral from the skin surface. Electronically Signed   By: Inez Catalina M.D.   On: 07/08/2018 13:32    ____________________________________________   PROCEDURES  Procedure(s) performed: None  Procedures  Critical Care performed: No  ____________________________________________   INITIAL IMPRESSION / ASSESSMENT AND PLAN / ED COURSE  As part of my medical decision making, I reviewed the following data within the [facility]    Patient presents for peri-anal lesion consistent with abscess.  Discussed patient with Dr. Lysle Pearl and patient will be admitted for incision and drainage.      ____________________________________________   FINAL CLINICAL IMPRESSION(S) / ED DIAGNOSES  Final diagnoses:  Abscess of buttock, right  Cellulitis of right lower extremity  Perianal abscess     ED Discharge Orders         Ordered    sulfamethoxazole-trimethoprim (BACTRIM DS,SEPTRA DS) 800-160 MG tablet  2 times daily     07/08/18 1213    mupirocin ointment (BACTROBAN) 2 %     07/08/18 1213    naproxen (NAPROSYN) 500 MG tablet  2 times daily with meals     07/08/18 1213           Note:  This document was prepared using Dragon voice recognition software and may include unintentional dictation errors.    Sable Feil, PA-C 07/08/18 1436    Eula Listen, MD 07/08/18 [phone]

## 2018-07-08 NOTE — Anesthesia Preprocedure Evaluation (Signed)
Anesthesia Evaluation  Patient identified by MRN, date of birth, ID band  Reviewed: Allergy & Precautions, NPO status , Patient's Chart, lab work & pertinent test results  History of Anesthesia Complications (+) PONV, Family history of anesthesia reaction and history of anesthetic complications (mother with heart problems)  Airway Mallampati: I       Dental  (+) Upper Dentures   Pulmonary shortness of breath, asthma , COPD,  COPD inhaler, Current Smoker, former smoker,    Pulmonary exam normal        Cardiovascular hypertension, Pt. on medications Normal cardiovascular exam+ dysrhythmias (prolonged QT)      Neuro/Psych  Headaches, PSYCHIATRIC DISORDERS Anxiety Depression  Neuromuscular disease    GI/Hepatic negative GI ROS, GERD  Medicated and Poorly Controlled,(+) Hepatitis -, C  Endo/Other  negative endocrine ROS  Renal/GU negative Renal ROS     Musculoskeletal   Abdominal Normal abdominal exam  (+)   Peds  Hematology   Anesthesia Other Findings   Reproductive/Obstetrics                             Anesthesia Physical  Anesthesia Plan  ASA: III  Anesthesia Plan: General   Post-op Pain Management:  Regional for Post-op pain   Induction: Intravenous  PONV Risk Score and Plan:   Airway Management Planned: Oral ETT  Additional Equipment:   Intra-op Plan:   Post-operative Plan: Extubation in OR  Informed Consent: I have reviewed the patients History and Physical, chart, labs and discussed the procedure including the risks, benefits and alternatives for the proposed anesthesia with the patient or authorized representative who has indicated his/her understanding and acceptance.     Plan Discussed with:   Anesthesia Plan Comments:         Anesthesia Quick Evaluation

## 2018-07-08 NOTE — ED Triage Notes (Signed)
Patient reports pain to right buttock for 3-4 days. Patient states there is a hard knot on left buttock but denies any drainage or fever. Patient seen at [facility] and sent to ED for further evaluation.

## 2018-07-08 NOTE — Op Note (Signed)
Preoperative diagnosis: Perirectal abscess Postoperative diagnosis: same  Procedure:  Exam under anesthesia Incision and drainage of perirectal abscess  Anesthesia: GETA  Surgeon: Lysle Pearl  Wound Classification: Contaminated  Indications: Patient is a 62 y.o. female  presented with Perirectal abscess.  See H&P for further details.  Specimen: Perirectal abscess culture and capsule for tissue exam  Complications: None  Estimated Blood Loss: 26mL  Findings:  1.  Indurated region lateral to the sphincter muscles from the skin with well-formed capsule 2. Minute purulent secretions drained and cultured 3. Adequate hemostasis.   Description of procedure: The patient was placed in the high lithotomy position and GETA anesthesia was induced. The area was prepped and draped in the usual sterile fashion. A timeout was completed verifying correct patient, procedure, site, positioning, and implant(s) and/or special equipment prior to beginning this procedure. Bedside ultrasound was performed to assess if any obviously fluid collection was able to be visualized around the most easily palpable indurated area.  No obvious fluid collection was noted but area of heterogeneous tissue noted.  Digital rectal exam noted a consistent area of firmness along the 7 o'clock position that was palpable through the rectal mucosa.  Direct visualization with exam under anesthesia did not note any obvious changes to the mucosal lining, including any signs of a fistula.  Due to the lack of an obvious fluid collection as well as the firmness being easily palpable from the lateral aspect of the right buttock, decision was made to carry out an incision in the skin area for better visualization of the area of concern. Local infused over planned incision site.  Inicision made into the skin and blunt dissection carried down to the easily palpable area of firmness that was lateral to the sphincter muscles.  Needle aspiration was  attempted but no return of fluid noted.  At this point using an 11 blade an incision was made into the capsular-like structure that was visible through the incision site.  Prior to incision it was visually confirmed that no striated muscle fibers were visible at the planned incision site.  Incision was made and the cavity was entered with a hemostat, or culture swabs were inserted and passed off operative field pending cultures.  Minute amount of purulent discharge was noted after irrigating the cavity.  Part of the visible capsule was sharply dissected off the operative field and sent off to pathology pending a tissue exam due to the not so typical presentation of this abscess cavity.  Wound was then copiously irrigated hemostasis was noted, wound dressed with 4 x 4's and the procedure was terminated at this point.  The patient tolerated the procedure well and was taken to the postanesthesia care unit in satisfactory condition.  Count and instrument counts were correct at the end of the procedure.  Ultrasound images are placed in the paper chart.

## 2018-07-08 NOTE — Anesthesia Procedure Notes (Signed)
Procedure Name: Intubation Date/Time: 07/08/2018 5:41 PM Performed by: Dionne Bucy, CRNA Pre-anesthesia Checklist: Patient identified, Patient being monitored, Timeout performed, Emergency Drugs available and Suction available Patient Re-evaluated:Patient Re-evaluated prior to induction Oxygen Delivery Method: Circle system utilized Preoxygenation: Pre-oxygenation with 100% oxygen Induction Type: IV induction Ventilation: Mask ventilation without difficulty Laryngoscope Size: Mac and 3 Grade View: Grade I Tube type: Oral Tube size: 7.0 mm Number of attempts: 1 Airway Equipment and Method: Stylet Placement Confirmation: ETT inserted through vocal cords under direct vision,  positive ETCO2 and breath sounds checked- equal and bilateral Secured at: 21 cm Tube secured with: Tape Dental Injury: Teeth and Oropharynx as per pre-operative assessment

## 2018-07-09 ENCOUNTER — Encounter: Payer: Self-pay | Admitting: Surgery

## 2018-07-09 DIAGNOSIS — K5900 Constipation, unspecified: Secondary | ICD-10-CM | POA: Diagnosis present

## 2018-07-09 DIAGNOSIS — Z7982 Long term (current) use of aspirin: Secondary | ICD-10-CM | POA: Diagnosis not present

## 2018-07-09 DIAGNOSIS — K219 Gastro-esophageal reflux disease without esophagitis: Secondary | ICD-10-CM | POA: Diagnosis present

## 2018-07-09 DIAGNOSIS — F419 Anxiety disorder, unspecified: Secondary | ICD-10-CM | POA: Diagnosis present

## 2018-07-09 DIAGNOSIS — Z90710 Acquired absence of both cervix and uterus: Secondary | ICD-10-CM | POA: Diagnosis not present

## 2018-07-09 DIAGNOSIS — Z800 Family history of malignant neoplasm of digestive organs: Secondary | ICD-10-CM | POA: Diagnosis not present

## 2018-07-09 DIAGNOSIS — Z791 Long term (current) use of non-steroidal anti-inflammatories (NSAID): Secondary | ICD-10-CM | POA: Diagnosis not present

## 2018-07-09 DIAGNOSIS — M4802 Spinal stenosis, cervical region: Secondary | ICD-10-CM | POA: Diagnosis present

## 2018-07-09 DIAGNOSIS — I10 Essential (primary) hypertension: Secondary | ICD-10-CM | POA: Diagnosis present

## 2018-07-09 DIAGNOSIS — K611 Rectal abscess: Secondary | ICD-10-CM | POA: Diagnosis present

## 2018-07-09 DIAGNOSIS — Z79891 Long term (current) use of opiate analgesic: Secondary | ICD-10-CM | POA: Diagnosis not present

## 2018-07-09 DIAGNOSIS — G894 Chronic pain syndrome: Secondary | ICD-10-CM | POA: Diagnosis present

## 2018-07-09 DIAGNOSIS — L988 Other specified disorders of the skin and subcutaneous tissue: Secondary | ICD-10-CM | POA: Diagnosis present

## 2018-07-09 DIAGNOSIS — Z9049 Acquired absence of other specified parts of digestive tract: Secondary | ICD-10-CM | POA: Diagnosis not present

## 2018-07-09 DIAGNOSIS — Z886 Allergy status to analgesic agent status: Secondary | ICD-10-CM | POA: Diagnosis not present

## 2018-07-09 DIAGNOSIS — Z885 Allergy status to narcotic agent status: Secondary | ICD-10-CM | POA: Diagnosis not present

## 2018-07-09 DIAGNOSIS — Z79899 Other long term (current) drug therapy: Secondary | ICD-10-CM | POA: Diagnosis not present

## 2018-07-09 DIAGNOSIS — F17210 Nicotine dependence, cigarettes, uncomplicated: Secondary | ICD-10-CM | POA: Diagnosis present

## 2018-07-09 DIAGNOSIS — Z89029 Acquired absence of unspecified finger(s): Secondary | ICD-10-CM | POA: Diagnosis not present

## 2018-07-09 DIAGNOSIS — J449 Chronic obstructive pulmonary disease, unspecified: Secondary | ICD-10-CM | POA: Diagnosis present

## 2018-07-09 DIAGNOSIS — F329 Major depressive disorder, single episode, unspecified: Secondary | ICD-10-CM | POA: Diagnosis present

## 2018-07-09 DIAGNOSIS — B1920 Unspecified viral hepatitis C without hepatic coma: Secondary | ICD-10-CM | POA: Diagnosis present

## 2018-07-09 DIAGNOSIS — Z888 Allergy status to other drugs, medicaments and biological substances status: Secondary | ICD-10-CM | POA: Diagnosis not present

## 2018-07-09 MED ORDER — DOCUSATE SODIUM 100 MG PO CAPS: 100.0000 mg | ORAL_CAPSULE | Freq: Two times a day (BID) | ORAL | 0 refills | Status: AC | PRN

## 2018-07-09 NOTE — Discharge Summary (Signed)
Physician Discharge Summary  Patient ID: Amanda Bennett MRN: [id number] DOB/AGE: [birth year] 62 y.o.  Admit date: 07/08/2018 Discharge date: 07/09/2018  Admission Diagnoses: perirectal abscess  Discharge Diagnoses:  Same as above  Discharged Condition: good  Hospital Course: admitted for perirectal abscess, drained in OR.  Recovered without issues and sent home for close f/u.  Consults: None  Discharge Exam: Blood pressure 92/64, pulse 96, temperature 98.3 F (36.8 C), temperature source Oral, resp. rate 16, height 5\' 4"  (1.626 m), weight 45.8 kg, last menstrual period 10/23/2002, SpO2 98 %. General appearance: alert, cooperative and no distress Skin: chaperone present for exam.  decreased induration at abscess site, where incision still open and draining serosanguinous fluid.  no active bleding and minimally tender to palpation, no discharge from anus noted.  Disposition:  Discharge disposition: 01-Home or Self Care       Discharge Instructions    Discharge patient   Complete by:  As directed    Discharge disposition:  01-Home or Self Care   Discharge patient date:  07/09/2018     Allergies as of 07/09/2018      Reactions   Morphine And Related Shortness Of Breath, Itching, Nausea Only   Tylenol [acetaminophen] Other (See Comments)   Liver Failure    Fluoxetine Hcl Other (See Comments)   Suicidal    Lisinopril Cough   Tramadol    Nervous, anxiety   Statins    Severe myalgia with Atorvastatin and Rosuvastatin   Hydrocodone Nausea Only, Rash   Shaking, nervous      Medication List    STOP taking these medications   traZODone 50 MG tablet Commonly known as:  DESYREL     TAKE these medications   albuterol 108 (90 Base) MCG/ACT inhaler Commonly known as:  PROVENTIL HFA;VENTOLIN HFA Inhale 1-2 puffs into the lungs every 6 (six) hours as needed for wheezing or shortness of breath.   aspirin 81 MG chewable tablet Chew 81 mg by mouth daily.   BIOTIN  PO Take 500 mcg by mouth.   clonazePAM 0.5 MG tablet Commonly known as:  KLONOPIN Take 1 mg by mouth at bedtime.   Co Q 10 100 MG Caps Take 1 capsule by mouth daily.   docusate sodium 100 MG capsule Commonly known as:  COLACE Take 1 capsule (100 mg total) by mouth 2 (two) times daily as needed for up to 10 days for mild constipation.   ezetimibe 10 MG tablet Commonly known as:  ZETIA TAKE 1 TABLET BY MOUTH DAILY   Fish Oil 1000 MG Caps Take by mouth.   ibuprofen 200 MG tablet Commonly known as:  ADVIL,MOTRIN Take 3 tablets (600 mg total) by mouth 3 (three) times daily. What changed:    when to take this  reasons to take this   losartan 100 MG tablet Commonly known as:  COZAAR Take 1 tablet (100 mg total) by mouth daily.   LYRICA 150 MG capsule Generic drug:  pregabalin Take 1 capsule by mouth 2 (two) times daily.   mupirocin ointment 2 % Commonly known as:  BACTROBAN Apply to affected area 3 times daily   naproxen 500 MG tablet Commonly known as:  NAPROSYN Take 1 tablet (500 mg total) by mouth 2 (two) times daily with a meal.   naproxen sodium 220 MG tablet Commonly known as:  ALEVE Take 220 mg by mouth 2 (two) times daily as needed (pain).   omeprazole 20 MG capsule Commonly known as:  PRILOSEC Take  20 mg by mouth every morning.   sulfamethoxazole-trimethoprim 800-160 MG tablet Commonly known as:  BACTRIM DS,SEPTRA DS Take 1 tablet by mouth 2 (two) times daily.   traMADol 50 MG tablet Commonly known as:  ULTRAM Take 50 mg by mouth daily as needed.   vitamin E 400 UNIT capsule Generic drug:  vitamin E Take 400 Units by mouth daily.      Follow-up Information    [facility] EMERGENCY DEPARTMENT.   Specialty:  Emergency Medicine Why:  If symptoms worsen Contact information: [place] [id number] [facility]       Benjamine Sprague, DO. Go on 07/16/2018.   Specialty:   Surgery Why:  @2 :15pm Contact information: [place] [address] [phone]            Total time spent arranging discharge was >73min. Signed: Benjamine Sprague 07/09/2018, 3:31 PM

## 2018-07-09 NOTE — Anesthesia Postprocedure Evaluation (Signed)
Anesthesia Post Note  Patient: Amanda Bennett  Procedure(s) Performed: EXAM UNDER ANESTHESIA (N/A Buttocks) IRRIGATION AND DEBRIDEMENT PERIRECTAL ABSCESS (N/A Buttocks)  Patient location during evaluation: PACU Anesthesia Type: General Level of consciousness: awake and alert and oriented Pain management: pain level controlled Vital Signs Assessment: post-procedure vital signs reviewed and stable Respiratory status: spontaneous breathing Cardiovascular status: blood pressure returned to baseline Anesthetic complications: no     Last Vitals:  Vitals:   07/08/18 2243 07/08/18 2359  BP: (!) 142/84 108/67  Pulse: 85 80  Resp: 18   Temp: 36.7 C 36.7 C  SpO2: 100% 99%    Last Pain:  Vitals:   07/08/18 2359  TempSrc: Oral  PainSc:                  [name]

## 2018-07-11 ENCOUNTER — Other Ambulatory Visit: Payer: Self-pay | Admitting: Surgery

## 2018-07-11 ENCOUNTER — Ambulatory Visit
Admission: RE | Admit: 2018-07-11 | Discharge: 2018-07-11 | Disposition: A | Discharge status: Home / Self Care | Payer: Medicare HMO | Source: Ambulatory Visit | Attending: Surgery | Admitting: Surgery

## 2018-07-11 DIAGNOSIS — K611 Rectal abscess: Secondary | ICD-10-CM

## 2018-07-11 DIAGNOSIS — K612 Anorectal abscess: Secondary | ICD-10-CM | POA: Diagnosis not present

## 2018-07-11 DIAGNOSIS — I700 Atherosclerosis of aorta: Secondary | ICD-10-CM | POA: Diagnosis not present

## 2018-07-11 LAB — SURGICAL PATHOLOGY

## 2018-07-11 MED ORDER — IOPAMIDOL (ISOVUE-300) INJECTION 61%: 100.0000 mL | Freq: Once | INTRAVENOUS | Status: DC | PRN

## 2018-07-11 MED ORDER — IOHEXOL 300 MG/ML  SOLN
75.0000 mL | Freq: Once | INTRAMUSCULAR | Status: AC | PRN
  Administered 2018-07-11: 75 mL via INTRAVENOUS

## 2018-07-12 ENCOUNTER — Other Ambulatory Visit: Payer: Self-pay | Admitting: Surgery

## 2018-07-13 LAB — AEROBIC/ANAEROBIC CULTURE W GRAM STAIN (SURGICAL/DEEP WOUND)

## 2018-07-14 ENCOUNTER — Telehealth: Payer: Self-pay | Admitting: Pediatrics

## 2018-07-14 NOTE — Telephone Encounter (Signed)
RNCM contacted patient to follow up on responses to EMMI call.  Patient answered medications not filled and wounds not feeling well  Patient states that all of her medication are now filled.  Patient states that her abscess is "not draining like it should".  Patient states that she has spoken with Dr. Lysle Pearl.  They have cancelled her appointment that was scheduled on 11/6, and made a referral to a specialist at [facility] "he is a colon rectal specialist".  Patient states that she is waiting for a call back about the referral, and if she doesn't hear back she is going to call tomorrow.   Patient states that she has been instructed by Dr. Lysle Pearl to continue sitz baths, and return to the ED if the area worsens before going to the specialist.  Patient states that all of her concerns have been addressed.  Patient praises the care that she received while at [facility].

## 2018-08-25 ENCOUNTER — Other Ambulatory Visit: Payer: Self-pay | Admitting: Student

## 2018-08-25 ENCOUNTER — Ambulatory Visit
Admission: RE | Admit: 2018-08-25 | Discharge: 2018-08-25 | Disposition: A | Discharge status: Home / Self Care | Payer: Medicare HMO | Source: Ambulatory Visit | Attending: Student | Admitting: Student

## 2018-08-25 DIAGNOSIS — K6289 Other specified diseases of anus and rectum: Secondary | ICD-10-CM

## 2018-08-25 MED ORDER — IOPAMIDOL (ISOVUE-300) INJECTION 61%
100.0000 mL | Freq: Once | INTRAVENOUS | Status: AC | PRN
  Administered 2018-08-25: 100 mL via INTRAVENOUS

## 2018-09-13 ENCOUNTER — Emergency Department
Admission: EM | Admit: 2018-09-13 | Discharge: 2018-09-13 | Disposition: A | Discharge status: Home / Self Care | Payer: Medicare HMO | Attending: Student in an Organized Health Care Education/Training Program | Admitting: Student in an Organized Health Care Education/Training Program

## 2018-09-13 ENCOUNTER — Other Ambulatory Visit: Payer: Self-pay

## 2018-09-13 ENCOUNTER — Emergency Department: Payer: Medicare HMO

## 2018-09-13 ENCOUNTER — Encounter: Payer: Self-pay | Admitting: Emergency Medicine

## 2018-09-13 DIAGNOSIS — F17210 Nicotine dependence, cigarettes, uncomplicated: Secondary | ICD-10-CM | POA: Diagnosis not present

## 2018-09-13 DIAGNOSIS — I10 Essential (primary) hypertension: Secondary | ICD-10-CM | POA: Diagnosis not present

## 2018-09-13 DIAGNOSIS — K6289 Other specified diseases of anus and rectum: Secondary | ICD-10-CM | POA: Diagnosis present

## 2018-09-13 DIAGNOSIS — Z7982 Long term (current) use of aspirin: Secondary | ICD-10-CM | POA: Insufficient documentation

## 2018-09-13 DIAGNOSIS — Z79899 Other long term (current) drug therapy: Secondary | ICD-10-CM | POA: Insufficient documentation

## 2018-09-13 DIAGNOSIS — Z9049 Acquired absence of other specified parts of digestive tract: Secondary | ICD-10-CM | POA: Insufficient documentation

## 2018-09-13 DIAGNOSIS — F329 Major depressive disorder, single episode, unspecified: Secondary | ICD-10-CM | POA: Diagnosis not present

## 2018-09-13 DIAGNOSIS — F419 Anxiety disorder, unspecified: Secondary | ICD-10-CM | POA: Diagnosis not present

## 2018-09-13 DIAGNOSIS — Z85828 Personal history of other malignant neoplasm of skin: Secondary | ICD-10-CM | POA: Diagnosis not present

## 2018-09-13 DIAGNOSIS — J449 Chronic obstructive pulmonary disease, unspecified: Secondary | ICD-10-CM | POA: Insufficient documentation

## 2018-09-13 DIAGNOSIS — K604 Rectal fistula: Secondary | ICD-10-CM | POA: Diagnosis not present

## 2018-09-13 DIAGNOSIS — J45909 Unspecified asthma, uncomplicated: Secondary | ICD-10-CM | POA: Insufficient documentation

## 2018-09-13 LAB — COMPREHENSIVE METABOLIC PANEL
ALT: 19 U/L (ref 0–44)
AST: 24 U/L (ref 15–41)
Albumin: 4.3 g/dL (ref 3.5–5.0)
Alkaline Phosphatase: 85 U/L (ref 38–126)
Anion gap: 7 (ref 5–15)
BUN: 8 mg/dL (ref 6–20)
CO2: 25 mmol/L (ref 22–32)
Calcium: 9.1 mg/dL (ref 8.9–10.3)
Chloride: 106 mmol/L (ref 98–111)
Creatinine, Ser: 0.58 mg/dL (ref 0.44–1.00)
GFR calc Af Amer: >60 mL/min (ref >60)
GFR calc non Af Amer: >60 mL/min (ref >60)
Glucose, Bld: 97 mg/dL (ref 70–99)
Potassium: 3.5 mmol/L (ref 3.5–5.1)
Sodium: 138 mmol/L (ref 135–145)
Total Bilirubin: 0.4 mg/dL (ref 0.3–1.2)
Total Protein: 7.2 g/dL (ref 6.5–8.1)

## 2018-09-13 LAB — CBC WITH DIFFERENTIAL/PLATELET
Abs Immature Granulocytes: 0.01 10*3/uL (ref 0.00–0.07)
Basophils Absolute: 0 10*3/uL (ref 0.0–0.1)
Basophils Relative: 0 %
Eosinophils Absolute: 0.2 10*3/uL (ref 0.0–0.5)
Eosinophils Relative: 2 %
HCT: 36.1 % (ref 36.0–46.0)
Hemoglobin: 12.1 g/dL (ref 12.0–15.0)
Immature Granulocytes: 0 %
Lymphocytes Relative: 28 %
Lymphs Abs: 1.9 10*3/uL (ref 0.7–4.0)
MCH: 31.7 pg (ref 26.0–34.0)
MCHC: 33.5 g/dL (ref 30.0–36.0)
MCV: 94.5 fL (ref 80.0–100.0)
Monocytes Absolute: 0.6 10*3/uL (ref 0.1–1.0)
Monocytes Relative: 8 %
Neutro Abs: 4.3 10*3/uL (ref 1.7–7.7)
Neutrophils Relative %: 62 %
Platelets: 254 10*3/uL (ref 150–400)
RBC: 3.82 MIL/uL — ABNORMAL LOW (ref 3.87–5.11)
RDW: 12.9 % (ref 11.5–15.5)
WBC: 7 10*3/uL (ref 4.0–10.5)
nRBC: 0 % (ref 0.0–0.2)

## 2018-09-13 MED ORDER — PROMETHAZINE HCL 25 MG/ML IJ SOLN
12.5000 mg | Freq: Four times a day (QID) | INTRAMUSCULAR | Status: DC | PRN
  Administered 2018-09-13: 12.5 mg via INTRAVENOUS
  Filled 2018-09-13: qty 1

## 2018-09-13 MED ORDER — MORPHINE SULFATE (PF) 4 MG/ML IV SOLN: 4.0000 mg | INTRAVENOUS | Status: DC | PRN

## 2018-09-13 MED ORDER — LACTULOSE 10 G PO PACK: 10.0000 g | PACK | Freq: Three times a day (TID) | ORAL | 0 refills | Status: DC

## 2018-09-13 MED ORDER — OXYCODONE HCL 5 MG PO TABS: 5.0000 mg | ORAL_TABLET | Freq: Four times a day (QID) | ORAL | 0 refills | Status: DC | PRN

## 2018-09-13 MED ORDER — FENTANYL CITRATE (PF) 100 MCG/2ML IJ SOLN
100.0000 ug | INTRAMUSCULAR | Status: DC | PRN
  Administered 2018-09-13: 100 ug via INTRAVENOUS
  Filled 2018-09-13: qty 2

## 2018-09-13 MED ORDER — IOPAMIDOL (ISOVUE-300) INJECTION 61%
100.0000 mL | Freq: Once | INTRAVENOUS | Status: AC | PRN
  Administered 2018-09-13: 100 mL via INTRAVENOUS

## 2018-09-13 NOTE — ED Provider Notes (Signed)
[facility] Emergency Department Provider Note    First MD Initiated Contact with Patient 09/13/18 1544     (approximate)  I have reviewed the triage vital signs and the nursing notes.   HISTORY  Chief Complaint Abscess    HPI Amanda Bennett is a 63 y.o. female below listed past medical history with recent perirectal and perianal abscess requiring IR drainage presents to the ER for persistent perirectal pain and difficulty moving her bowels.  Denies any fevers.  No recent antibiotics.  States that she tried to follow-up in cranial clinic who directed her to the ER to be evaluated and have "abscess drained."    Past Medical History:  Diagnosis Date  . Anxiety   . Arthritis pain   . Asthma   . Bruises easily   . Cancer (HCC)    BASAL CELL  . Carpal tunnel syndrome   . Cervical spinal stenosis 2008   per x-ray findings 2008  . Cervical syndrome   . Cervicalgia   . Chronic pain syndrome   . COPD (chronic obstructive pulmonary disease) ([place])   . Cough   . Depression   . Dysrhythmia    prolonged QT  . Family history of adverse reaction to anesthesia    PTS MOM GETS SICK  . Foot fracture, right 07/2010   per x-ray 07/2010 - oblique comminuted fifth metatarsal fracture, followed by Dr. Lorelei Pont  . GERD (gastroesophageal reflux disease)   . Headache    MIGRAINES  . Hepatitis    hx of Hep C -PT STATES SHE RECEIVED TREATMENT AND NO LONGER HAS HEP C  . Hypertension   . Insomnia   . Lesion of ulnar nerve   . Lumbago   . Osteoporosis   . Pain in limb   . PONV (postoperative nausea and vomiting)    shortness of breath, nausea  . Primary localized osteoarthrosis, pelvic region and thigh   . Pulmonary nodule, right    RUL nodule, 7 mm noted on Chest XRAY - 06/2008, per CT follow up in 2009 - No suspicious pulmonary nodules or masse noted  . Shortness of breath dyspnea    ONLY WHEN WAKING UP IN AM-PT USES ALBUTEROL INHALER AND THAT SEEMS TO HELP    . Unexplained weight loss   . Visual disturbance   . Wears contact lenses   . Wears glasses   . Wound disruption, post-op, skin    Family History  Problem Relation Age of Onset  . Cancer Father        colon/prostate  . Colon cancer Father 39       deceased at age 70 from colon cancer  . Cancer Paternal Grandmother        ovarian  . Stroke Neg Hx   . Stomach cancer Neg Hx    Past Surgical History:  Procedure Laterality Date  . ABDOMINAL HYSTERECTOMY    . broken clavical    . CARPAL TUNNEL RELEASE     right and left  . CHOLECYSTECTOMY    . CHOLECYSTECTOMY  10/30/2011   Procedure: LAPAROSCOPIC CHOLECYSTECTOMY WITH INTRAOPERATIVE CHOLANGIOGRAM;  Surgeon: Imogene Burn. Georgette Dover, MD;  Location: [facility];  Service: General;  Laterality: N/A;  . COLONOSCOPY    . DORSAL COMPARTMENT RELEASE Left 07/20/2015   Procedure: RELEASE DORSAL COMPARTMENT (DEQUERVAIN);  Surgeon: Corky Mull, MD;  Location: [place];  Service: Orthopedics;  Laterality: Left;  . drain in left ring finger  cat bite  . finger amputated     right ring  . HEMORRHOID SURGERY  11/25/2006  . INCISION AND DRAINAGE PERIRECTAL ABSCESS N/A 07/08/2018   Procedure: IRRIGATION AND DEBRIDEMENT PERIRECTAL ABSCESS;  Surgeon: Benjamine Sprague, DO;  Location: [facility];  Service: General;  Laterality: N/A;  . INGUINAL LYMPH NODE BIOPSY Right 02/03/2014   Procedure: INGUINAL LYMPH NODE BIOPSY;  Surgeon: Zenovia Jarred, MD;  Location: [place];  Service: General;  Laterality: Right;  right inguinal area  . SHOULDER ARTHROSCOPY Left 07/20/2015   Procedure: ARTHROSCOPY SHOULDER DEBRIDEMENT AND DECOMPRESSION;  Surgeon: Corky Mull, MD;  Location: [place];  Service: Orthopedics;  Laterality: Left;  . SHOULDER ARTHROSCOPY Left 01/12/2016   Procedure: ARTHROSCOPY SHOULDER WITH DEBRIDEMENT AND EXCISION OF THE DISTAL CLAVICLE;  Surgeon: Corky Mull, MD;  Location: [facility];  Service: Orthopedics;  Laterality:  Left;   Patient Active Problem List   Diagnosis Date Noted  . Perirectal abscess 07/08/2018  . Palpitations 10/01/2014  . Tobacco abuse 07/23/2014  . Mouth lesion 07/23/2014  . Sore throat 07/23/2014  . Postoperative wound infection 02/13/2014  . Inguinal lymphadenopathy 12/30/2013  . Family history of long QT syndrome 09/18/2013  . Chest pain on exertion 09/18/2013  . Rash and nonspecific skin eruption 09/18/2013  . Leg pain 09/18/2013  . Right groin pain 06/18/2013  . Preventative health care 06/18/2013  . CAFL (chronic airflow limitation) ([place]) 04/10/2013  . BP (high blood pressure) 04/10/2013  . Closed traumatic PIP dislocation 04/07/2013  . Abnormality of gait 12/16/2012  . Asthma 08/21/2012  . Myofascial pain dysfunction syndrome 06/25/2012  . Bursitis of hip, right 04/09/2012  . Hypertension 04/09/2012  . Headache(784.0) 04/09/2012  . Hyperlipidemia 04/09/2012  . Low back pain 12/26/2011  . Lumbar spondylosis 12/26/2011  . Localized primary carpometacarpal osteoarthritis 11/07/2011  . Chronic neck pain 11/07/2011  . Chronic calculus cholecystitis 10/03/2011  . Skin lesion of right arm 10/02/2011  . Osteoporosis 10/02/2011  . Gallstone 10/02/2011  . Hot flash, menopausal 01/17/2011  . History of hepatitis C 06/15/2010  . CARPAL TUNNEL SYNDROME, BILATERAL 12/06/2009  . GERD 06/22/2009  . Depression with anxiety 05/25/2009  . [place] DISEASE, CERVICAL 05/25/2009  . Insomnia 05/25/2009      Prior to Admission medications   Medication Sig Start Date End Date Taking? Authorizing Provider  albuterol (PROVENTIL HFA;VENTOLIN HFA) 108 (90 BASE) MCG/ACT inhaler Inhale 1-2 puffs into the lungs every 6 (six) hours as needed for wheezing or shortness of breath.    [provider]  aspirin 81 MG chewable tablet Chew 81 mg by mouth daily.    [provider]  BIOTIN PO Take 500 mcg by mouth.    [provider]  clonazePAM (KLONOPIN) 0.5 MG tablet Take  1 mg by mouth at bedtime.  11/08/15   [provider]  Coenzyme Q10 (CO Q 10) 100 MG CAPS Take 1 capsule by mouth daily. 10/23/16   [provider]  ezetimibe (ZETIA) 10 MG tablet TAKE 1 TABLET BY MOUTH DAILY Patient taking differently: Take 10 mg by mouth daily.  04/15/18   Wellington Hampshire, MD  ibuprofen (ADVIL,MOTRIN) 200 MG tablet Take 3 tablets (600 mg total) by mouth 3 (three) times daily. Patient taking differently: Take 600 mg by mouth every 8 (eight) hours as needed.  07/20/15   Poggi, Marshall Cork, MD  lactulose (CEPHULAC) 10 g packet Take 1 packet (10 g total) by mouth 3 (three) times daily.  09/13/18   Merlyn Lot, MD  losartan (COZAAR) 100 MG tablet Take 1 tablet (100 mg total) by mouth daily. 01/08/17   Wellington Hampshire, MD  LYRICA 150 MG capsule Take 1 capsule by mouth 2 (two) times daily. 01/07/17   [provider]  mupirocin ointment (BACTROBAN) 2 % Apply to affected area 3 times daily 07/08/18 07/08/19  Sable Feil, PA-C  naproxen (NAPROSYN) 500 MG tablet Take 1 tablet (500 mg total) by mouth 2 (two) times daily with a meal. 07/08/18   Sable Feil, PA-C  naproxen sodium (ANAPROX) 220 MG tablet Take 220 mg by mouth 2 (two) times daily as needed (pain).    [provider]  Omega-3 Fatty Acids (FISH OIL) 1000 MG CAPS Take by mouth.    [provider]  omeprazole (PRILOSEC) 20 MG capsule Take 20 mg by mouth every morning.     [provider]  oxyCODONE (ROXICODONE) 5 MG immediate release tablet Take 1 tablet (5 mg total) by mouth every 6 (six) hours as needed. 09/13/18 09/13/19  Merlyn Lot, MD  sulfamethoxazole-trimethoprim (BACTRIM DS,SEPTRA DS) 800-160 MG tablet Take 1 tablet by mouth 2 (two) times daily. 07/08/18   Sable Feil, PA-C  traMADol (ULTRAM) 50 MG tablet Take 50 mg by mouth daily as needed.  07/22/17   [provider]  vitamin E (VITAMIN E) 400 UNIT capsule Take 400 Units by mouth daily.    [provider]    Allergies Morphine and related; Tylenol [acetaminophen]; Fluoxetine hcl; Lisinopril; Tramadol; Statins; and Hydrocodone    Social History Social History   Tobacco Use  . Smoking status: Current Some Day Smoker    Packs/day: 0.50    Years: 20.00    Pack years: 10.00    Types: Cigarettes    Last attempt to quit: 10/24/2014    Years since quitting: 3.8  . Smokeless tobacco: Never Used  Substance Use Topics  . Alcohol use: Yes    Alcohol/week: 0.0 standard drinks    Comment: OCC BEER  . Drug use: No    Review of Systems Patient denies headaches, rhinorrhea, blurry vision, numbness, shortness of breath, chest pain, edema, cough, abdominal pain, nausea, vomiting, diarrhea, dysuria, fevers, rashes or hallucinations unless otherwise stated above in HPI. ____________________________________________   PHYSICAL EXAM:  VITAL SIGNS: Vitals:   09/13/18 1643 09/13/18 1803  BP: 122/76 127/81  Pulse: 77 76  Resp: 18 18  Temp:    SpO2: 99% 95%    Constitutional: Alert and oriented.  Eyes: Conjunctivae are normal.  Head: Atraumatic. Nose: No congestion/rhinnorhea. Mouth/Throat: Mucous membranes are moist.   Neck: No stridor. Painless ROM.  Cardiovascular: Normal rate, regular rhythm. Grossly normal heart sounds.  Good peripheral circulation. Respiratory: Normal respiratory effort.  No retractions. Lungs CTAB. Gastrointestinal: Soft and nontender. No distention. No abdominal bruits. No CVA tenderness. Genitourinary: No perirectal cellulitis.  Does have small area of right pararectal fluctuance mildly tender.  May be secondary to scar tissue.  No evidence of fistula.  No bleeding.  Nonthrombosed hemorrhoids noted. Musculoskeletal: No lower extremity tenderness nor edema.  No joint effusions. Neurologic:  Normal speech and language. No gross focal neurologic deficits are appreciated. No facial droop Skin:  Skin is warm, dry and intact. No rash noted. Psychiatric:  Mood and affect are normal. Speech and behavior are normal.  ____________________________________________   LABS (all labs ordered are listed, but only abnormal results are displayed)  Results for orders placed or performed  during the hospital encounter of 09/13/18 (from the past 24 hour(s))  CBC with Differential     Status: Abnormal   Collection Time: 09/13/18  2:05 PM  Result Value Ref Range   WBC 7.0 4.0 - 10.5 K/uL   RBC 3.82 (L) 3.87 - 5.11 MIL/uL   Hemoglobin 12.1 12.0 - 15.0 g/dL   HCT 36.1 36.0 - 46.0 %   MCV 94.5 80.0 - 100.0 fL   MCH 31.7 26.0 - 34.0 pg   MCHC 33.5 30.0 - 36.0 g/dL   RDW 12.9 11.5 - 15.5 %   Platelets 254 150 - 400 K/uL   nRBC 0.0 0.0 - 0.2 %   Neutrophils Relative % 62 %   Neutro Abs 4.3 1.7 - 7.7 K/uL   Lymphocytes Relative 28 %   Lymphs Abs 1.9 0.7 - 4.0 K/uL   Monocytes Relative 8 %   Monocytes Absolute 0.6 0.1 - 1.0 K/uL   Eosinophils Relative 2 %   Eosinophils Absolute 0.2 0.0 - 0.5 K/uL   Basophils Relative 0 %   Basophils Absolute 0.0 0.0 - 0.1 K/uL   Immature Granulocytes 0 %   Abs Immature Granulocytes 0.01 0.00 - 0.07 K/uL  Comprehensive metabolic panel     Status: None   Collection Time: 09/13/18  2:05 PM  Result Value Ref Range   Sodium 138 135 - 145 mmol/L   Potassium 3.5 3.5 - 5.1 mmol/L   Chloride 106 98 - 111 mmol/L   CO2 25 22 - 32 mmol/L   Glucose, Bld 97 70 - 99 mg/dL   BUN 8 6 - 20 mg/dL   Creatinine, Ser 0.58 0.44 - 1.00 mg/dL   Calcium 9.1 8.9 - 10.3 mg/dL   Total Protein 7.2 6.5 - 8.1 g/dL   Albumin 4.3 3.5 - 5.0 g/dL   AST 24 15 - 41 U/L   ALT 19 0 - 44 U/L   Alkaline Phosphatase 85 38 - 126 U/L   Total Bilirubin 0.4 0.3 - 1.2 mg/dL   GFR calc non Af Amer >60 >60 mL/min   GFR calc Af Amer >60 >60 mL/min   Anion gap 7 5 - 15   ____________________________________________ ____________________________________________  RADIOLOGY  I personally reviewed all radiographic images ordered to evaluate for the above  acute complaints and reviewed radiology reports and findings.  These findings were personally discussed with the patient.  Please see medical record for radiology report.  ____________________________________________   PROCEDURES  Procedure(s) performed:  Procedures    Critical Care performed: no ____________________________________________   INITIAL IMPRESSION / ASSESSMENT AND PLAN / ED COURSE  Pertinent labs & imaging results that were available during my care of the patient were reviewed by me and considered in my medical decision making (see chart for details).   DDX: perirectal abscess, perianal abscess, cellulitis, proctitis  Amanda Bennett is a 63 y.o. who presents to the ED with symptoms as described above.  Blood work is reassuring.  Will order CT imaging to evaluate for deep space infection or persistent fistula.  Will give IV pain medication and reassess.  Clinical Course as of Sep 13 1906  Sat Sep 13, 2018  1062 Repeat abdominal exam is soft benign.  CT imaging shows persistent fistula but no evidence of drainable abscess.  Have low suspicion for acute infection given lack of fever or white count.  I consulted with Dr. Lysle Pearl of general surgery who agrees there is no indication for emergent intervention at this time  and the most prudent management is for the patient to keep her follow-up appointment with colon and rectal surgery on Monday for further management recommendations.  We will give her a short course of oral pain medication until she can have her follow-up on Monday.   [PR]    Clinical Course User Index [PR] Merlyn Lot, MD     As part of my medical decision making, I reviewed the following data within the [place] notes reviewed and incorporated, Labs reviewed, notes from prior ED visits.   ____________________________________________   FINAL CLINICAL IMPRESSION(S) / ED DIAGNOSES  Final diagnoses:  Perirectal fistula        NEW MEDICATIONS STARTED DURING THIS VISIT:  Discharge Medication List as of 09/13/2018  6:38 PM    START taking these medications   Details  lactulose (CEPHULAC) 10 g packet Take 1 packet (10 g total) by mouth 3 (three) times daily., Starting Sat 09/13/2018, Print    oxyCODONE (ROXICODONE) 5 MG immediate release tablet Take 1 tablet (5 mg total) by mouth every 6 (six) hours as needed., Starting Sat 09/13/2018, Until Sun 09/13/2019, Print         Note:  This document was prepared using Dragon voice recognition software and may include unintentional dictation errors.    Merlyn Lot, MD 09/13/18 Pauline Aus

## 2018-09-13 NOTE — Discharge Instructions (Addendum)

## 2018-09-13 NOTE — ED Notes (Signed)
Patient is agitated and tearful, moving easily around the room. Patient states she can't find her phone in the lobby. Patient states she can get a ride home, but states she wants "Demerol or Dilaudid and will walk out of here." Patient requested and was given hospital phone to call her sister.

## 2018-09-13 NOTE — ED Triage Notes (Signed)
Pt arrives with complaints of recurrent abscess and infection of her anus. Pt states she was told by [facility] to come to the ED to have abscess drained. Pt states the area of concern has increased in swelling and pain over the last 6 weeks. Pt states she has not been able to have a successful bowel movement in the last week.

## 2018-09-13 NOTE — ED Notes (Signed)
Per registration report, patient walked out to the lobby to find her phone and go to the restroom.

## 2018-09-17 ENCOUNTER — Ambulatory Visit: Payer: Self-pay | Admitting: General Surgery

## 2018-09-17 NOTE — H&P (Signed)
History of Present Illness Amanda Ruff MD; 09/15/1094 11:30 AM) The patient is a 63 year old female who presents with anal pain. Patient presents to the office with a non-resolving perirectal abscess which has been present since October 2019. She has been on multiple rounds of antibiotics and has undergone an I and D here in the office. She continues to have pain and drainage on that side. She reports chronic constipation and uses lactulose to have a bowel movement approximately once or twice a week. She has undergone several CT scans which show a right lateral area of induration without a drainable fluid collection.  She states she is otherwise healthy. She does not have diabetes and is not on blood thinners. She smokes about 3 cigarettes a week.   Problem List/Past Medical Amanda Ruff, MD; 0/12/5407 11:30 AM) SEVERE RT GROIN PAIN (R10.31) She had a right inguinal node biopsy - 02/03/2014 - [facility] for benign disease. ANAL PAIN (K62.89) ANAL FISTULA (K60.3)  Past Surgical History Amanda Ruff, MD; 04/10/1913 11:30 AM) Gallbladder Surgery - Laparoscopic Hemorrhoidectomy Hysterectomy (due to cancer) - Complete Hysterectomy (not due to cancer) - Complete Shoulder Surgery Right.  Diagnostic Studies History Amanda Ruff, MD; 03/18/2955 11:30 AM) Colonoscopy 1-5 years ago Mammogram 1-3 years ago Pap Smear 1-5 years ago  Allergies Sabino Gasser, CMA; 09/17/2018 11:13 AM) Morphine Sulfate (PF) *ANALGESICS - OPIOID* PROzac *ANTIDEPRESSANTS* Tylenol *ANALGESICS - NonNarcotic* Allergies Reconciled  Medication History Sabino Gasser, CMA; 09/17/2018 11:13 AM) Ambien (5MG  Tablet, Oral) Active. Norvasc (5MG  Tablet, Oral) Active. BusPIRone HCl (7.5MG  Tablet, Oral) Active. Gabapentin (300MG  Capsule, Oral) Active. Omeprazole (20MG  Capsule DR, Oral) Active. Medications Reconciled  Social History Amanda Ruff, MD; 10/11/3084 11:30 AM) Alcohol use Occasional  alcohol use. No drug use Tobacco use Current every day smoker.  Family History Amanda Ruff, MD; 01/15/8468 11:30 AM) Arthritis Mother. Cancer Father. Heart disease in female family member before age 67 Melanoma Father.  Other Problems Amanda Ruff, MD; 02/10/9527 11:30 AM) High blood pressure Oophorectomy Bilateral.     Review of Systems Amanda Ruff MD; 12/10/3242 11:30 AM) Respiratory Present- Snoring. Not Present- Bloody sputum, Chronic Cough, Difficulty Breathing and Wheezing. Cardiovascular Present- Difficulty Breathing Lying Down and Palpitations. Not Present- Chest Pain, Leg Cramps, Rapid Heart Rate, Shortness of Breath and Swelling of Extremities. Gastrointestinal Present- Abdominal Pain and Nausea. Not Present- Bloating, Bloody Stool, Change in Bowel Habits, Chronic diarrhea, Constipation, Difficulty Swallowing, Excessive gas, Gets full quickly at meals, Hemorrhoids, Indigestion, Rectal Pain and Vomiting.  Vitals Sabino Gasser CMA; 09/17/2018 11:14 AM) 09/17/2018 11:13 AM Weight: 105.13 lb Height: 65in Body Surface Area: 1.51 m Body Mass Index: 17.49 kg/m  Temp.: 98.50F(Oral)  Pulse: 91 (Regular)  BP: 110/72 (Sitting, Left Arm, Standard)      Physical Exam Amanda Ruff MD; 0/09/270 11:28 AM)  The physical exam findings are as follows: Note:GENERAL: Thin, elderly female in no acute distress  EYES: No scleral icterus Pupils equal, lids normal  EXTERNAL EARS: Intact, no masses or lesions EXTERNAL NOSE: Intact, no masses or lesions MOUTH: Lips - no lesions Dentition - normal for age  RESPIRATORY: Normal effort, no use of accessory muscles  MUSCULOSKELETAL: Normal gait Grossly normal ROM upper extremities Grossly normal ROM lower extremities  SKIN: Warm and dry Not diaphoretic  PSYCHIATRIC: Normal judgement and insight Normal mood and affect Alert, oriented x 3  Rectal Note: There is a small area of firmness right  lateral There is no overlying erythema or fluctuance clear drainage    Assessment &  Plan Amanda Ruff MD; 03/16/8241 11:31 AM)  ANAL FISTULA (K60.3) Impression: 63 year old female with a history of perirectal abscess who has developed a right lateral perianal fistula. We discussed the process for treatment of this which includes an exam under anesthesia and seton placement versus fistulotomy. We discussed that if she has minimal sphincter involvement we can perform a fistulotomy and this will most likely take care of the problem. We also discussed that if she does have more than 20% of the internal sphincter we will plan on placing a seton. Patient tends to have more constipation than diarrhea and therefore can probably sacrifice some muscle function. She is aware of possible incotinence with fistulotomy.

## 2018-09-17 NOTE — H&P (View-Only) (Signed)
History of Present Illness Amanda Ruff MD; 09/15/1094 11:30 AM) The patient is a 63 year old female who presents with anal pain. Patient presents to the office with a non-resolving perirectal abscess which has been present since October 2019. She has been on multiple rounds of antibiotics and has undergone an I and D here in the office. She continues to have pain and drainage on that side. She reports chronic constipation and uses lactulose to have a bowel movement approximately once or twice a week. She has undergone several CT scans which show a right lateral area of induration without a drainable fluid collection.  She states she is otherwise healthy. She does not have diabetes and is not on blood thinners. She smokes about 3 cigarettes a week.   Problem List/Past Medical Amanda Ruff, MD; 0/12/5407 11:30 AM) SEVERE RT GROIN PAIN (R10.31) She had a right inguinal node biopsy - 02/03/2014 - [facility] for benign disease. ANAL PAIN (K62.89) ANAL FISTULA (K60.3)  Past Surgical History Amanda Ruff, MD; 04/10/1913 11:30 AM) Gallbladder Surgery - Laparoscopic Hemorrhoidectomy Hysterectomy (due to cancer) - Complete Hysterectomy (not due to cancer) - Complete Shoulder Surgery Right.  Diagnostic Studies History Amanda Ruff, MD; 03/18/2955 11:30 AM) Colonoscopy 1-5 years ago Mammogram 1-3 years ago Pap Smear 1-5 years ago  Allergies Sabino Gasser, CMA; 09/17/2018 11:13 AM) Morphine Sulfate (PF) *ANALGESICS - OPIOID* PROzac *ANTIDEPRESSANTS* Tylenol *ANALGESICS - NonNarcotic* Allergies Reconciled  Medication History Sabino Gasser, CMA; 09/17/2018 11:13 AM) Ambien (5MG  Tablet, Oral) Active. Norvasc (5MG  Tablet, Oral) Active. BusPIRone HCl (7.5MG  Tablet, Oral) Active. Gabapentin (300MG  Capsule, Oral) Active. Omeprazole (20MG  Capsule DR, Oral) Active. Medications Reconciled  Social History Amanda Ruff, MD; 10/11/3084 11:30 AM) Alcohol use Occasional  alcohol use. No drug use Tobacco use Current every day smoker.  Family History Amanda Ruff, MD; 01/15/8468 11:30 AM) Arthritis Mother. Cancer Father. Heart disease in female family member before age 49 Melanoma Father.  Other Problems Amanda Ruff, MD; 02/10/9527 11:30 AM) High blood pressure Oophorectomy Bilateral.     Review of Systems Amanda Ruff MD; 12/10/3242 11:30 AM) Respiratory Present- Snoring. Not Present- Bloody sputum, Chronic Cough, Difficulty Breathing and Wheezing. Cardiovascular Present- Difficulty Breathing Lying Down and Palpitations. Not Present- Chest Pain, Leg Cramps, Rapid Heart Rate, Shortness of Breath and Swelling of Extremities. Gastrointestinal Present- Abdominal Pain and Nausea. Not Present- Bloating, Bloody Stool, Change in Bowel Habits, Chronic diarrhea, Constipation, Difficulty Swallowing, Excessive gas, Gets full quickly at meals, Hemorrhoids, Indigestion, Rectal Pain and Vomiting.  Vitals Sabino Gasser CMA; 09/17/2018 11:14 AM) 09/17/2018 11:13 AM Weight: 105.13 lb Height: 65in Body Surface Area: 1.51 m Body Mass Index: 17.49 kg/m  Temp.: 98.58F(Oral)  Pulse: 91 (Regular)  BP: 110/72 (Sitting, Left Arm, Standard)      Physical Exam Amanda Ruff MD; 0/09/270 11:28 AM)  The physical exam findings are as follows: Note:GENERAL: Thin, elderly female in no acute distress  EYES: No scleral icterus Pupils equal, lids normal  EXTERNAL EARS: Intact, no masses or lesions EXTERNAL NOSE: Intact, no masses or lesions MOUTH: Lips - no lesions Dentition - normal for age  RESPIRATORY: Normal effort, no use of accessory muscles  MUSCULOSKELETAL: Normal gait Grossly normal ROM upper extremities Grossly normal ROM lower extremities  SKIN: Warm and dry Not diaphoretic  PSYCHIATRIC: Normal judgement and insight Normal mood and affect Alert, oriented x 3  Rectal Note: There is a small area of firmness right  lateral There is no overlying erythema or fluctuance clear drainage    Assessment &  Plan Amanda Ruff MD; 04/15/1682 11:31 AM)  ANAL FISTULA (K60.3) Impression: 63 year old female with a history of perirectal abscess who has developed a right lateral perianal fistula. We discussed the process for treatment of this which includes an exam under anesthesia and seton placement versus fistulotomy. We discussed that if she has minimal sphincter involvement we can perform a fistulotomy and this will most likely take care of the problem. We also discussed that if she does have more than 20% of the internal sphincter we will plan on placing a seton. Patient tends to have more constipation than diarrhea and therefore can probably sacrifice some muscle function. She is aware of possible incotinence with fistulotomy.

## 2018-09-26 ENCOUNTER — Encounter (HOSPITAL_BASED_OUTPATIENT_CLINIC_OR_DEPARTMENT_OTHER): Payer: Self-pay | Admitting: *Deleted

## 2018-09-29 ENCOUNTER — Other Ambulatory Visit: Payer: Self-pay

## 2018-09-29 ENCOUNTER — Encounter (HOSPITAL_BASED_OUTPATIENT_CLINIC_OR_DEPARTMENT_OTHER): Payer: Self-pay | Admitting: *Deleted

## 2018-09-29 NOTE — Progress Notes (Signed)
Spoke w/ pt via phone for pre-op interview.  Npo after mn.  Arrive at [facility].  Needs istat and ekg. Will take prilosec am dos w/ sips of water and asked to bring rescue inhaler.

## 2018-10-02 ENCOUNTER — Ambulatory Visit (HOSPITAL_BASED_OUTPATIENT_CLINIC_OR_DEPARTMENT_OTHER)
Admission: RE | Admit: 2018-10-02 | Discharge: 2018-10-02 | Disposition: A | Discharge status: Home / Self Care | Payer: Medicare HMO | Attending: General Surgery | Admitting: General Surgery

## 2018-10-02 ENCOUNTER — Other Ambulatory Visit: Payer: Self-pay

## 2018-10-02 ENCOUNTER — Ambulatory Visit (HOSPITAL_BASED_OUTPATIENT_CLINIC_OR_DEPARTMENT_OTHER): Payer: Medicare HMO | Admitting: Anesthesiology

## 2018-10-02 ENCOUNTER — Encounter (HOSPITAL_BASED_OUTPATIENT_CLINIC_OR_DEPARTMENT_OTHER): Payer: Self-pay | Admitting: Emergency Medicine

## 2018-10-02 ENCOUNTER — Encounter (HOSPITAL_BASED_OUTPATIENT_CLINIC_OR_DEPARTMENT_OTHER)
Admission: RE | Disposition: A | Discharge status: Home / Self Care | Payer: Self-pay | Source: Home / Self Care | Attending: General Surgery

## 2018-10-02 DIAGNOSIS — Z79899 Other long term (current) drug therapy: Secondary | ICD-10-CM | POA: Diagnosis not present

## 2018-10-02 DIAGNOSIS — K6282 Dysplasia of anus: Secondary | ICD-10-CM | POA: Diagnosis not present

## 2018-10-02 DIAGNOSIS — Z885 Allergy status to narcotic agent status: Secondary | ICD-10-CM | POA: Insufficient documentation

## 2018-10-02 DIAGNOSIS — B1920 Unspecified viral hepatitis C without hepatic coma: Secondary | ICD-10-CM | POA: Diagnosis not present

## 2018-10-02 DIAGNOSIS — F17210 Nicotine dependence, cigarettes, uncomplicated: Secondary | ICD-10-CM | POA: Insufficient documentation

## 2018-10-02 DIAGNOSIS — K219 Gastro-esophageal reflux disease without esophagitis: Secondary | ICD-10-CM | POA: Diagnosis not present

## 2018-10-02 DIAGNOSIS — I10 Essential (primary) hypertension: Secondary | ICD-10-CM | POA: Insufficient documentation

## 2018-10-02 DIAGNOSIS — K603 Anal fistula: Secondary | ICD-10-CM | POA: Insufficient documentation

## 2018-10-02 DIAGNOSIS — I739 Peripheral vascular disease, unspecified: Secondary | ICD-10-CM | POA: Diagnosis not present

## 2018-10-02 DIAGNOSIS — Z888 Allergy status to other drugs, medicaments and biological substances status: Secondary | ICD-10-CM | POA: Diagnosis not present

## 2018-10-02 DIAGNOSIS — J449 Chronic obstructive pulmonary disease, unspecified: Secondary | ICD-10-CM | POA: Insufficient documentation

## 2018-10-02 DIAGNOSIS — K5900 Constipation, unspecified: Secondary | ICD-10-CM | POA: Diagnosis not present

## 2018-10-02 DIAGNOSIS — Z886 Allergy status to analgesic agent status: Secondary | ICD-10-CM | POA: Diagnosis not present

## 2018-10-02 DIAGNOSIS — F418 Other specified anxiety disorders: Secondary | ICD-10-CM | POA: Diagnosis not present

## 2018-10-02 DIAGNOSIS — A630 Anogenital (venereal) warts: Secondary | ICD-10-CM | POA: Insufficient documentation

## 2018-10-02 DIAGNOSIS — M1990 Unspecified osteoarthritis, unspecified site: Secondary | ICD-10-CM | POA: Insufficient documentation

## 2018-10-02 HISTORY — DX: Peripheral vascular disease, unspecified: I73.9

## 2018-10-02 HISTORY — DX: Personal history of other infectious and parasitic diseases: Z86.19

## 2018-10-02 HISTORY — DX: Personal history of other specified conditions: Z87.898

## 2018-10-02 HISTORY — PX: EVALUATION UNDER ANESTHESIA WITH FISTULECTOMY: SHX5623

## 2018-10-02 HISTORY — DX: Presence of dental prosthetic device (complete) (partial): Z97.2

## 2018-10-02 HISTORY — DX: Unspecified osteoarthritis, unspecified site: M19.90

## 2018-10-02 HISTORY — DX: Other constipation: K59.09

## 2018-10-02 HISTORY — DX: Occlusion and stenosis of left vertebral artery: I65.02

## 2018-10-02 HISTORY — DX: Other specified postprocedural states: Z85.828

## 2018-10-02 HISTORY — DX: Other specified postprocedural states: Z98.890

## 2018-10-02 HISTORY — DX: Disorder of arteries and arterioles, unspecified: I77.9

## 2018-10-02 HISTORY — PX: PLACEMENT OF SETON: SHX6029

## 2018-10-02 LAB — POCT I-STAT 4, (NA,K, GLUC, HGB,HCT)
Glucose, Bld: 93 mg/dL (ref 70–99)
HCT: 36 % (ref 36.0–46.0)
Hemoglobin: 12.2 g/dL (ref 12.0–15.0)
Potassium: 3.9 mmol/L (ref 3.5–5.1)
Sodium: 142 mmol/L (ref 135–145)

## 2018-10-02 SURGERY — EXAM UNDER ANESTHESIA WITH FISTULECTOMY
Anesthesia: Monitor Anesthesia Care

## 2018-10-02 MED ORDER — LIDOCAINE 5 % EX OINT
TOPICAL_OINTMENT | CUTANEOUS | Status: DC | PRN
  Administered 2018-10-02: 1

## 2018-10-02 MED ORDER — OXYCODONE HCL 5 MG PO TABS
5.0000 mg | ORAL_TABLET | ORAL | Status: DC | PRN
  Filled 2018-10-02: qty 2

## 2018-10-02 MED ORDER — PROMETHAZINE HCL 25 MG/ML IJ SOLN
6.2500 mg | INTRAMUSCULAR | Status: DC | PRN
  Filled 2018-10-02: qty 1

## 2018-10-02 MED ORDER — PROPOFOL 500 MG/50ML IV EMUL
INTRAVENOUS | Status: AC
  Filled 2018-10-02: qty 50

## 2018-10-02 MED ORDER — ACETAMINOPHEN 500 MG PO TABS
1000.0000 mg | ORAL_TABLET | Freq: Once | ORAL | Status: AC
  Administered 2018-10-02: 1000 mg via ORAL
  Filled 2018-10-02: qty 2

## 2018-10-02 MED ORDER — PROPOFOL 10 MG/ML IV BOLUS
INTRAVENOUS | Status: DC | PRN
  Administered 2018-10-02 (×2): 20 mg via INTRAVENOUS

## 2018-10-02 MED ORDER — FENTANYL CITRATE (PF) 100 MCG/2ML IJ SOLN
25.0000 ug | INTRAMUSCULAR | Status: DC | PRN
  Administered 2018-10-02: 50 ug via INTRAVENOUS
  Administered 2018-10-02 (×2): 25 ug via INTRAVENOUS
  Filled 2018-10-02: qty 1

## 2018-10-02 MED ORDER — MIDAZOLAM HCL 5 MG/5ML IJ SOLN
INTRAMUSCULAR | Status: DC | PRN
  Administered 2018-10-02 (×2): 1 mg via INTRAVENOUS

## 2018-10-02 MED ORDER — BUPIVACAINE LIPOSOME 1.3 % IJ SUSP
INTRAMUSCULAR | Status: DC | PRN
  Administered 2018-10-02: 20 mL

## 2018-10-02 MED ORDER — BUPIVACAINE-EPINEPHRINE 0.5% -1:200000 IJ SOLN
INTRAMUSCULAR | Status: DC | PRN
  Administered 2018-10-02: 30 mL

## 2018-10-02 MED ORDER — ACETAMINOPHEN 650 MG RE SUPP
650.0000 mg | RECTAL | Status: DC | PRN
  Filled 2018-10-02: qty 1

## 2018-10-02 MED ORDER — GABAPENTIN 300 MG PO CAPS
ORAL_CAPSULE | ORAL | Status: AC
  Filled 2018-10-02: qty 1

## 2018-10-02 MED ORDER — ACETAMINOPHEN 500 MG PO TABS
ORAL_TABLET | ORAL | Status: AC
  Filled 2018-10-02: qty 2

## 2018-10-02 MED ORDER — FENTANYL CITRATE (PF) 100 MCG/2ML IJ SOLN
INTRAMUSCULAR | Status: DC | PRN
  Administered 2018-10-02 (×2): 12.5 ug via INTRAVENOUS
  Administered 2018-10-02: 25 ug via INTRAVENOUS

## 2018-10-02 MED ORDER — GABAPENTIN 300 MG PO CAPS
300.0000 mg | ORAL_CAPSULE | ORAL | Status: AC
  Administered 2018-10-02: 300 mg via ORAL
  Filled 2018-10-02: qty 1

## 2018-10-02 MED ORDER — MIDAZOLAM HCL 2 MG/2ML IJ SOLN
INTRAMUSCULAR | Status: AC
  Filled 2018-10-02: qty 2

## 2018-10-02 MED ORDER — FENTANYL CITRATE (PF) 100 MCG/2ML IJ SOLN
INTRAMUSCULAR | Status: AC
  Filled 2018-10-02: qty 2

## 2018-10-02 MED ORDER — LIDOCAINE HCL (CARDIAC) PF 100 MG/5ML IV SOSY
PREFILLED_SYRINGE | INTRAVENOUS | Status: DC | PRN
  Administered 2018-10-02: 60 mg via INTRAVENOUS
  Administered 2018-10-02: 20 mg via INTRAVENOUS

## 2018-10-02 MED ORDER — ACETAMINOPHEN 325 MG PO TABS
650.0000 mg | ORAL_TABLET | ORAL | Status: DC | PRN
  Filled 2018-10-02: qty 2

## 2018-10-02 MED ORDER — SODIUM CHLORIDE 0.9% FLUSH
3.0000 mL | INTRAVENOUS | Status: DC | PRN
  Filled 2018-10-02: qty 3

## 2018-10-02 MED ORDER — SODIUM CHLORIDE 0.9% FLUSH
3.0000 mL | Freq: Two times a day (BID) | INTRAVENOUS | Status: DC
  Filled 2018-10-02: qty 3

## 2018-10-02 MED ORDER — ARTIFICIAL TEARS OPHTHALMIC OINT
TOPICAL_OINTMENT | OPHTHALMIC | Status: AC
  Filled 2018-10-02: qty 3.5

## 2018-10-02 MED ORDER — ONDANSETRON HCL 4 MG/2ML IJ SOLN
INTRAMUSCULAR | Status: DC | PRN
  Administered 2018-10-02: 4 mg via INTRAVENOUS

## 2018-10-02 MED ORDER — PROPOFOL 500 MG/50ML IV EMUL
INTRAVENOUS | Status: DC | PRN
  Administered 2018-10-02: 100 ug/kg/min via INTRAVENOUS

## 2018-10-02 MED ORDER — LACTATED RINGERS IV SOLN
INTRAVENOUS | Status: DC
  Administered 2018-10-02 (×2): via INTRAVENOUS
  Filled 2018-10-02: qty 1000

## 2018-10-02 MED ORDER — ONDANSETRON HCL 4 MG/2ML IJ SOLN
INTRAMUSCULAR | Status: AC
  Filled 2018-10-02: qty 2

## 2018-10-02 MED ORDER — LIDOCAINE 2% (20 MG/ML) 5 ML SYRINGE
INTRAMUSCULAR | Status: AC
  Filled 2018-10-02: qty 5

## 2018-10-02 MED ORDER — HYDROCODONE-ACETAMINOPHEN 5-325 MG PO TABS: 1.0000 | ORAL_TABLET | Freq: Four times a day (QID) | ORAL | 0 refills | Status: DC | PRN

## 2018-10-02 MED ORDER — SODIUM CHLORIDE 0.9 % IV SOLN
250.0000 mL | INTRAVENOUS | Status: DC | PRN
  Filled 2018-10-02: qty 250

## 2018-10-02 SURGICAL SUPPLY — 51 items
BENZOIN TINCTURE PRP APPL 2/3 (GAUZE/BANDAGES/DRESSINGS) ×6 IMPLANT
BLADE EXTENDED COATED 6.5IN (ELECTRODE) IMPLANT
BLADE HEX COATED 2.75 (ELECTRODE) ×3 IMPLANT
BLADE SURG 10 STRL SS (BLADE) IMPLANT
BRIEF STRETCH FOR OB PAD LRG (UNDERPADS AND DIAPERS) ×3 IMPLANT
CANISTER SUCT 3000ML PPV (MISCELLANEOUS) ×3 IMPLANT
COVER BACK TABLE 60X90IN (DRAPES) ×3 IMPLANT
COVER MAYO STAND STRL (DRAPES) ×3 IMPLANT
COVER WAND RF STERILE (DRAPES) ×3 IMPLANT
DECANTER SPIKE VIAL GLASS SM (MISCELLANEOUS) ×3 IMPLANT
DRAPE LAPAROTOMY 100X72 PEDS (DRAPES) ×3 IMPLANT
DRAPE UTILITY XL STRL (DRAPES) ×3 IMPLANT
DRSG PAD ABDOMINAL 8X10 ST (GAUZE/BANDAGES/DRESSINGS) ×3 IMPLANT
ELECT REM PT RETURN 9FT ADLT (ELECTROSURGICAL) ×3
ELECTRODE REM PT RTRN 9FT ADLT (ELECTROSURGICAL) ×1 IMPLANT
GAUZE SPONGE 4X4 12PLY STRL (GAUZE/BANDAGES/DRESSINGS) ×3 IMPLANT
GLOVE BIO SURGEON STRL SZ 6.5 (GLOVE) ×2 IMPLANT
GLOVE BIO SURGEONS STRL SZ 6.5 (GLOVE) ×1
GLOVE BIOGEL PI IND STRL 7.0 (GLOVE) ×1 IMPLANT
GLOVE BIOGEL PI INDICATOR 7.0 (GLOVE) ×2
GOWN SPEC L3 XXLG W/TWL (GOWN DISPOSABLE) ×3 IMPLANT
GOWN STRL REUS W/TWL 2XL LVL3 (GOWN DISPOSABLE) ×3 IMPLANT
HYDROGEN PEROXIDE 16OZ (MISCELLANEOUS) ×3 IMPLANT
IV CATH 14GX2 1/4 (CATHETERS) ×3 IMPLANT
IV CATH 18G SAFETY (IV SOLUTION) ×3 IMPLANT
KIT TURNOVER CYSTO (KITS) ×3 IMPLANT
LOOP VESSEL MAXI BLUE (MISCELLANEOUS) ×3 IMPLANT
NEEDLE HYPO 22GX1.5 SAFETY (NEEDLE) ×3 IMPLANT
NS IRRIG 500ML POUR BTL (IV SOLUTION) ×3 IMPLANT
PACK BASIN DAY SURGERY FS (CUSTOM PROCEDURE TRAY) ×3 IMPLANT
PAD ABD 8X10 STRL (GAUZE/BANDAGES/DRESSINGS) ×3 IMPLANT
PAD ARMBOARD 7.5X6 YLW CONV (MISCELLANEOUS) ×3 IMPLANT
PENCIL BUTTON HOLSTER BLD 10FT (ELECTRODE) ×3 IMPLANT
SPONGE HEMORRHOID 8X3CM (HEMOSTASIS) IMPLANT
SPONGE SURGIFOAM ABS GEL 12-7 (HEMOSTASIS) IMPLANT
SUCTION FRAZIER HANDLE 10FR (MISCELLANEOUS)
SUCTION TUBE FRAZIER 10FR DISP (MISCELLANEOUS) IMPLANT
SUT CHROMIC 2 0 SH (SUTURE) IMPLANT
SUT CHROMIC 3 0 SH 27 (SUTURE) ×3 IMPLANT
SUT ETHIBOND 0 (SUTURE) ×3 IMPLANT
SUT VIC AB 2-0 SH 27 (SUTURE)
SUT VIC AB 2-0 SH 27XBRD (SUTURE) IMPLANT
SUT VIC AB 3-0 SH 18 (SUTURE) IMPLANT
SUT VIC AB 3-0 SH 27 (SUTURE)
SUT VIC AB 3-0 SH 27XBRD (SUTURE) IMPLANT
SYR CONTROL 10ML LL (SYRINGE) ×3 IMPLANT
TOWEL OR 17X24 6PK STRL BLUE (TOWEL DISPOSABLE) ×3 IMPLANT
TRAY DSU PREP LF (CUSTOM PROCEDURE TRAY) ×3 IMPLANT
TUBE CONNECTING 12'X1/4 (SUCTIONS) ×1
TUBE CONNECTING 12X1/4 (SUCTIONS) ×2 IMPLANT
YANKAUER SUCT BULB TIP NO VENT (SUCTIONS) ×3 IMPLANT

## 2018-10-02 NOTE — Anesthesia Procedure Notes (Addendum)
Procedure Name: MAC Date/Time: 10/02/2018 8:55 AM Performed by: Justice Rocher, CRNA Pre-anesthesia Checklist: Patient identified, Emergency Drugs available, Suction available, Patient being monitored and Timeout performed Patient Re-evaluated:Patient Re-evaluated prior to induction Oxygen Delivery Method: Nasal cannula Preoxygenation: Pre-oxygenation with 100% oxygen Placement Confirmation: breath sounds checked- equal and bilateral and positive ETCO2

## 2018-10-02 NOTE — Anesthesia Preprocedure Evaluation (Addendum)
Anesthesia Evaluation  Patient identified by MRN, date of birth, ID band Patient awake    Reviewed: Allergy & Precautions, NPO status , Patient's Chart, lab work & pertinent test results  History of Anesthesia Complications (+) PONV, Family history of anesthesia reaction and history of anesthetic complications  Airway Mallampati: II  TM Distance: >3 FB Neck ROM: Full    Dental  (+) Dental Advisory Given, Upper Dentures   Pulmonary asthma , COPD, Current Smoker,    Pulmonary exam normal breath sounds clear to auscultation       Cardiovascular hypertension, Pt. on medications + Peripheral Vascular Disease  Normal cardiovascular exam Rhythm:Regular Rate:Normal  H/o prolonged QT   Neuro/Psych  Headaches, PSYCHIATRIC DISORDERS Anxiety Depression    GI/Hepatic GERD  Medicated,(+) Hepatitis -, C  Endo/Other  negative endocrine ROS  Renal/GU negative Renal ROS     Musculoskeletal  (+) Arthritis ,   Abdominal   Peds  Hematology negative hematology ROS (+)   Anesthesia Other Findings Day of surgery medications reviewed with the patient.  Reproductive/Obstetrics                             Anesthesia Physical Anesthesia Plan  ASA: III  Anesthesia Plan: MAC   Post-op Pain Management:    Induction: Intravenous  PONV Risk Score and Plan: 2 and Propofol infusion, Treatment may vary due to age or medical condition and Midazolam  Airway Management Planned: Nasal Cannula and Natural Airway  Additional Equipment:   Intra-op Plan:   Post-operative Plan:   Informed Consent: I have reviewed the patients History and Physical, chart, labs and discussed the procedure including the risks, benefits and alternatives for the proposed anesthesia with the patient or authorized representative who has indicated his/her understanding and acceptance.     Dental advisory given  Plan Discussed with: CRNA  and Anesthesiologist  Anesthesia Plan Comments:         Anesthesia Quick Evaluation

## 2018-10-02 NOTE — Discharge Instructions (Addendum)
Post Anesthesia Home Care Instructions  Activity: Get plenty of rest for the remainder of the day. A responsible individual must stay with you for 24 hours following the procedure.  For the next 24 hours, DO NOT: -Drive a car -Paediatric nurse -Drink alcoholic beverages -Take any medication unless instructed by your physician -Make any legal decisions or sign important papers.  Meals: Start with liquid foods such as gelatin or soup. Progress to regular foods as tolerated. Avoid greasy, spicy, heavy foods. If nausea and/or vomiting occur, drink only clear liquids until the nausea and/or vomiting subsides. Call your physician if vomiting continues.  Special Instructions/Symptoms: Your throat may feel dry or sore from the anesthesia or the breathing tube placed in your throat during surgery. If this causes discomfort, gargle with warm salt water. The discomfort should disappear within 24 hours.  Information for Discharge Teaching: EXPAREL (bupivacaine liposome injectable suspension)   Your surgeon or anesthesiologist gave you EXPAREL(bupivacaine) to help control your pain after surgery.   EXPAREL is a local anesthetic that provides pain relief by numbing the tissue around the surgical site.  EXPAREL is designed to release pain medication over time and can control pain for up to 72 hours.  Depending on how you respond to EXPAREL, you may require less pain medication during your recovery.  Possible side effects:  Temporary loss of sensation or ability to move in the area where bupivacaine was injected.  Nausea, vomiting, constipation  Rarely, numbness and tingling in your mouth or lips, lightheadedness, or anxiety may occur.  Call your doctor right away if you think you may be experiencing any of these sensations, or if you have other questions regarding possible side effects.  Follow all other discharge instructions given to you by your surgeon or nurse. Eat a healthy diet and  drink plenty of water or other fluids.  If you return to the hospital for any reason within 96 hours following the administration of EXPAREL, it is important for health care providers to know that you have received this anesthetic. A teal colored band has been placed on your arm with the date, time and amount of EXPAREL you have received in order to alert and inform your health care providers. Please leave this armband in place for the full 96 hours following administration, and then you may remove the band.  ANORECTAL SURGERY: POST OP INSTRUCTIONS 1. Take your usually prescribed home medications unless otherwise directed. 2. DIET: During the first few hours after surgery sip on some liquids until you are able to urinate.  It is normal to not urinate for several hours after this surgery.  If you feel uncomfortable, please contact the office for instructions.  After you are able to urinate,you may eat, if you feel like it.  Follow a light bland diet the first 24 hours after arrival home, such as soup, liquids, crackers, etc.  Be sure to include lots of fluids daily (6-8 glasses).  Avoid fast food or heavy meals, as your are more likely to get nauseated.  Eat a low fat diet the next few days after surgery.  Limit caffeine intake to 1-2 servings a day. 3. PAIN CONTROL: a. Pain is best controlled by a usual combination of several different methods TOGETHER: i. Muscle relaxation: Soak in a warm bath (or Sitz bath) three times a day and after bowel movements.  Continue to do this until all pain is resolved. ii. Over the counter pain medication iii. Prescription pain medication b. Most patients  will experience some swelling and discomfort in the anus/rectal area and incisions.  Heat such as warm towels, sitz baths, warm baths, etc to help relax tight/sore spots and speed recovery.  Some people prefer to use ice, especially in the first couple days after surgery, as it may decrease the pain and swelling, or  alternate between ice & heat.  Experiment to what works for you.  Swelling and bruising can take several weeks to resolve.  Pain can take even longer to completely resolve. c. It is helpful to take an over-the-counter pain medication regularly for the first few weeks.  Choose one of the following that works best for you: i. Naproxen (Aleve, etc)  Two 220mg  tabs twice a day ii. Ibuprofen (Advil, etc) Three 200mg  tabs four times a day (every meal & bedtime) d. A  prescription for pain medication (such as percocet, oxycodone, hydrocodone, etc) should be given to you upon discharge.  Take your pain medication as prescribed.  i. If you are having problems/concerns with the prescription medicine (does not control pain, nausea, vomiting, rash, itching, etc), please call us [phone] to see if we need to switch you to a different pain medicine that will work better for you and/or control your side effect better. ii. If you need a refill on your pain medication, please contact your pharmacy.  They will contact our office to request authorization. Prescriptions will not be filled after 5 pm or on week-ends. 4. KEEP YOUR BOWELS REGULAR and AVOID CONSTIPATION a. The goal is one to two soft bowel movements a day.  You should at least have a bowel movement every other day. b. Avoid getting constipated.  Between the surgery and the pain medications, it is common to experience some constipation. This can be very painful after rectal surgery.  Increasing fluid intake and taking a fiber supplement (such as Metamucil, Citrucel, FiberCon, etc) 1-2 times a day regularly will usually help prevent this problem from occurring.  A stool softener like colace is also recommended.  This can be purchased over the counter at your pharmacy.  You can take it up to 3 times a day.  If you do not have a bowel movement after 24 hrs since your surgery, take one does of milk of magnesia.  If you still haven't had a bowel movement 8-12  hours after that dose, take another dose.  If you don't have a bowel movement 48 hrs after surgery, purchase a Fleets enema from the drug store and administer gently per package instructions.  If you still are having trouble with your bowel movements after that, please call the office for further instructions. c. If you develop diarrhea or have many loose bowel movements, simplify your diet to bland foods & liquids for a few days.  Stop any stool softeners and decrease your fiber supplement.  Switching to mild anti-diarrheal medications (Kayopectate, Pepto Bismol) can help.  If this worsens or does not improve, please call us.  5. Wound Care a. Remove your bandages before your first bowel movement or 8 hours after surgery.     b. Remove any wound packing material at this tim,e as well.  You do not need to repack the wound unless instructed otherwise.  Wear an absorbent pad or soft cotton gauze in your underwear to catch any drainage and help keep the area clean. You should change this every 2-3 hours while awake. c. Keep the area clean and dry.  Bathe / shower every day, especially  after bowel movements.  Keep the area clean by showering / bathing over the incision / wound.   It is okay to soak an open wound to help wash it.  Wet wipes or showers / gentle washing after bowel movements is often less traumatic than regular toilet paper. d. Dennis Bast may have some styrofoam-like soft packing in the rectum which will come out with the first bowel movement.  e. You will often notice bleeding with bowel movements.  This should slow down by the end of the first week of surgery f. Expect some drainage.  This should slow down, too, by the end of the first week of surgery.  Wear an absorbent pad or soft cotton gauze in your underwear until the drainage stops. g. Do Not sit on a rubber or pillow ring.  This can make you symptoms worse.  You may sit on a soft pillow if needed.  6. ACTIVITIES as tolerated:   a. You may  resume regular (light) daily activities beginning the next day--such as daily self-care, walking, climbing stairs--gradually increasing activities as tolerated.  If you can walk 30 minutes without difficulty, it is safe to try more intense activity such as jogging, treadmill, bicycling, low-impact aerobics, swimming, etc. b. Save the most intensive and strenuous activity for last such as sit-ups, heavy lifting, contact sports, etc  Refrain from any heavy lifting or straining until you are off narcotics for pain control.   c. You may drive when you are no longer taking prescription pain medication, you can comfortably sit for long periods of time, and you can safely maneuver your car and apply brakes. d. Dennis Bast may have sexual intercourse when it is comfortable.  7. FOLLOW UP in our office a. Please call [facility] at [phone] to set up an appointment to see your surgeon in the office for a follow-up appointment approximately 3-4 weeks after your surgery. b. Make sure that you call for this appointment the day you arrive home to insure a convenient appointment time. 10. IF YOU HAVE DISABILITY OR FAMILY LEAVE FORMS, BRING THEM TO THE OFFICE FOR PROCESSING.  DO NOT GIVE THEM TO YOUR DOCTOR.     WHEN TO CALL us [phone]: 1. Poor pain control 2. Reactions / problems with new medications (rash/itching, nausea, etc)  3. Fever over 101.5 F (38.5 C) 4. Inability to urinate 5. Nausea and/or vomiting 6. Worsening swelling or bruising 7. Continued bleeding from incision. 8. Increased pain, redness, or drainage from the incision  The clinic staff is available to answer your questions during regular business hours (8:30am-5pm).  Please dont hesitate to call and ask to speak to one of our nurses for clinical concerns.   A surgeon from [facility] is always on call at the hospitals   If you have a medical emergency, go to the nearest emergency room or call 911.    [facility],  [address], [place], [place], [place]  [address] ? MAIN: [phone] ? TOLL FREE: [phone] ? FAX [phone] [email or web address]

## 2018-10-02 NOTE — Interval H&P Note (Signed)
History and Physical Interval Note:  10/02/2018 7:22 AM  Amanda Bennett  has presented today for surgery, with the diagnosis of anal fistula  The various methods of treatment have been discussed with the patient and family. After consideration of risks, benefits and other options for treatment, the patient has consented to  Procedure(s): ANAL EXAM UNDER ANESTHESIA (N/A) PLACEMENT OF SETON VS FISTULOTOMY (N/A) as a surgical intervention .  The patient's history has been reviewed, patient examined, no change in status, stable for surgery.  I have reviewed the patient's chart and labs.  Questions were answered to the patient's satisfaction.     Rosario Adie, MD  [facility]

## 2018-10-02 NOTE — Op Note (Signed)
10/02/2018  9:20 AM  PATIENT:  Amanda Bennett  63 y.o. female  Patient Care Team: Alvester Chou, NP as PCP - General (Nurse Practitioner)  PRE-OPERATIVE DIAGNOSIS:  anal fistula  POST-OPERATIVE DIAGNOSIS:  Anal fistula, anal canal lesion  PROCEDURE:   ANAL EXAM UNDER ANESTHESIA PLACEMENT OF SETON  BIOPSY ANAL CANAL   Surgeon(s): Leighton Ruff, MD  ASSISTANT: none   ANESTHESIA:   local and MAC  SPECIMEN:  Source of Specimen:  anterior anal canal lesion  DISPOSITION OF SPECIMEN:  PATHOLOGY  COUNTS:  YES  PLAN OF CARE: Discharge to home after PACU  PATIENT DISPOSITION:  PACU - hemodynamically stable.  INDICATION: 63 y.o. F with perianal fistula   OR FINDINGS: R lateral external opening with posterior midline internal opening  DESCRIPTION: the patient was identified in the preoperative holding area and taken to the OR where they were laid on the operating room table.  MAC anesthesia was induced without difficulty. The patient was then positioned in prone jackknife position with buttocks gently taped apart.  The patient was then prepped and draped in usual sterile fashion.  SCDs were noted to be in place prior to the initiation of anesthesia. A surgical timeout was performed indicating the correct patient, procedure, positioning and need for preoperative antibiotics.  A rectal block was performed using Marcaine with epinephrine mixed with Experel.    I began with a digital rectal exam.  Resting tone was good.  I then placed a Hill-Ferguson anoscope into the anal canal and evaluated this completely.  There was an obvious internal opening at posterior midline.  There was a 1 cm discrete lesion noted in the anal canal.    I placed a #8 fistula probe through the external opening and this exited to the internal opening.  There appeared to be a moderate size cavity underneath.  This was felt to contain too much of the sphincter complex to perform a fistulotomy therefore an Ethibond  suture was pulled through the fistula tract and a vessel loop was pulled back through to create a seton.  The Ethibond suture was used to fix the seton in a circular fashion.  I then turned my attention to the anal canal lesion in the anterior anal canal.  An incisional biopsy was made using Metzenbaum scissors.  Biopsy site was closed using a 3-0 chromic suture.  Hemostasis was good.  The patient tolerated this well and was awakened from anesthesia and sent to the postanesthesia care unit in stable condition.  All counts were correct per operating room staff.

## 2018-10-02 NOTE — Transfer of Care (Signed)
   Last Vitals:  Vitals Value Taken Time  BP 144/67 10/02/2018  9:30 AM  Temp    Pulse 67 10/02/2018  9:30 AM  Resp 13 10/02/2018  9:30 AM  SpO2 100 % 10/02/2018  9:30 AM  Vitals shown include unvalidated device data.  Last Pain:  Vitals:   10/02/18 0656  TempSrc: Oral  PainSc: 6         Immediate Anesthesia Transfer of Care Note  Patient: [name]  Procedure(s) Performed: Procedure(s) (LRB): ANAL EXAM UNDER ANESTHESIA (N/A) PLACEMENT OF SETON VS FISTULOTOMY, BIOPSY (N/A)  Patient Location: PACU  Anesthesia Type:MAC Level of Consciousness: awake, alert  and oriented  Airway & Oxygen Therapy: Patient Spontanous Breathing and Patient connected to face mask oxygen  Post-op Assessment: Report given to PACU RN and Post -op Vital signs reviewed and stable  Post vital signs: Reviewed and stable  Complications: No apparent anesthesia complications

## 2018-10-03 ENCOUNTER — Encounter (HOSPITAL_BASED_OUTPATIENT_CLINIC_OR_DEPARTMENT_OTHER): Payer: Self-pay | Admitting: General Surgery

## 2018-10-03 NOTE — Anesthesia Postprocedure Evaluation (Signed)
Anesthesia Post Note  Patient: Amanda Bennett  Procedure(s) Performed: ANAL EXAM UNDER ANESTHESIA (N/A ) PLACEMENT OF SETON VS FISTULOTOMY, BIOPSY (N/A )     Patient location during evaluation: PACU Anesthesia Type: MAC Level of consciousness: awake and alert Pain management: pain level controlled Vital Signs Assessment: post-procedure vital signs reviewed and stable Respiratory status: spontaneous breathing, nonlabored ventilation, respiratory function stable and patient connected to nasal cannula oxygen Cardiovascular status: stable and blood pressure returned to baseline Postop Assessment: no apparent nausea or vomiting Anesthetic complications: no    Last Vitals:  Vitals:   10/02/18 1015 10/02/18 1100  BP: 125/74 138/90  Pulse: 86 68  Resp: 16 16  Temp:  36.6 C  SpO2: 100% 100%    Last Pain:  Vitals:   10/02/18 1100  TempSrc:   PainSc: 0-No pain                 Catalina Gravel

## 2019-01-06 ENCOUNTER — Ambulatory Visit: Payer: Self-pay | Admitting: General Surgery

## 2019-01-06 NOTE — H&P (Signed)
History of Present Illness Leighton Ruff MD; 05/26/3845 12:25 PM) The patient is a 63 year old female who presents with anal pain. Patient presented to the office with a non-resolving perirectal abscess which has been present since October 2019. She has been on multiple rounds of antibiotics and has undergone an I and D here in the office. She continued to have pain and drainage on that side. She reports chronic constipation and uses lactulose to have a bowel movement approximately once or twice a week.   She was taken to the operating room in mid January 2020. A seton was placed. She also had an anterior lesion that appeared to be AIN. This was biopsied. Since that time she has had some difficulty with anal pain. But his resolved with otc pain medications. She reports quite a bit of drainage.   Problem List/Past Medical Leighton Ruff, MD; 6/59/9357 12:25 PM) ANAL PAIN (K62.89) SEVERE RT GROIN PAIN (R10.31) She had a right inguinal node biopsy - 02/03/2014 - [facility] for benign disease. ANAL FISTULA (K60.3) ITCHING (L29.9)  Past Surgical History Leighton Ruff, MD; 0/17/7939 12:25 PM) Colon Polyp Removal - Colonoscopy Gallbladder Surgery - Laparoscopic Colon Polyp Removal - Open Hysterectomy (due to cancer) - Complete Hemorrhoidectomy Hysterectomy (not due to cancer) - Complete Shoulder Surgery Right.  Diagnostic Studies History Leighton Ruff, MD; 0/30/0923 12:25 PM) Mammogram 1-3 years ago Colonoscopy 1-5 years ago Pap Smear 1-5 years ago  Allergies Mammie Lorenzo, LPN; 3/00/7622 63:33 PM) Morphine Sulfate (PF) *ANALGESICS - OPIOID* PROzac *ANTIDEPRESSANTS* Tylenol *ANALGESICS - NonNarcotic*  Medication History Mammie Lorenzo, LPN; 5/45/6256 38:93 PM) Ibuprofen (200MG  Capsule, Oral) Active. Ambien (5MG  Tablet, Oral) Active. BusPIRone HCl (7.5MG  Tablet, Oral) Active. Gabapentin (300MG  Capsule, Oral) Active. Omeprazole (20MG  Capsule DR, Oral)  Active. Medications Reconciled  Social History Leighton Ruff, MD; 7/34/2876 12:25 PM) Alcohol use Occasional alcohol use. Tobacco use Current every day smoker, Current some day smoker. No drug use  Family History Leighton Ruff, MD; 04/20/5725 12:25 PM) Arthritis Mother. Arthritis Mother. Breast Cancer Mother. Cancer Father. Heart disease in female family member before age 33 Colon Cancer Father. Ovarian Cancer Mother. Melanoma Father.  Pregnancy / Birth History Leighton Ruff, MD; 10/14/5595 12:25 PM) Age of menopause <45 Gravida 3 Maternal age 20-20  Other Problems Leighton Ruff, MD; 12/25/3843 12:25 PM) Hypercholesterolemia High blood pressure Oophorectomy Bilateral.     Review of Systems Leighton Ruff MD; 3/64/6803 12:26 PM) Respiratory Present- Snoring. Not Present- Bloody sputum, Chronic Cough, Difficulty Breathing and Wheezing. Cardiovascular Present- Difficulty Breathing Lying Down and Palpitations. Not Present- Chest Pain, Leg Cramps, Rapid Heart Rate, Shortness of Breath and Swelling of Extremities. Gastrointestinal Present- Abdominal Pain and Nausea. Not Present- Bloating, Bloody Stool, Change in Bowel Habits, Chronic diarrhea, Constipation, Difficulty Swallowing, Excessive gas, Gets full quickly at meals, Hemorrhoids, Indigestion, Rectal Pain and Vomiting.  Vitals Claiborne Billings Dockery LPN; 10/22/2480 50:03 PM) 01/06/2019 12:10 PM Weight: 105 lb Height: 65in Body Surface Area: 1.5 m Body Mass Index: 17.47 kg/m  Temp.: 98.36F(Oral)  Pulse: 103 (Regular)  BP: 132/68 (Sitting, Left Arm, Standard)      Physical Exam Leighton Ruff MD; 03/13/8888 12:26 PM)  The physical exam findings are as follows: Note:GENERAL: Thin, elderly female in no acute distress  EYES: No scleral icterus Pupils equal, lids normal  EXTERNAL EARS: Intact, no masses or lesions EXTERNAL NOSE: Intact, no masses or lesions MOUTH: Lips - no  lesions Dentition - normal for age  RESPIRATORY: Normal effort, no use of accessory muscles  MUSCULOSKELETAL:  Normal gait Grossly normal ROM upper extremities Grossly normal ROM lower extremities  SKIN: Warm and dry Not diaphoretic  PSYCHIATRIC: Normal judgement and insight Normal mood and affect Alert, oriented x 3  Rectal Note: right lateral steon in place There is no overlying erythema or fluctuance clear drainage    Assessment & Plan Leighton Ruff MD; 2/92/4462 12:24 PM)  ANAL FISTULA (K60.3) Impression: 63 year old female who presents to the office after seton placement for anal fistula in Jan 2020. She was also noted to have an anterior lesion. Biopsy show HGSIL. I have recommended a ligation of internal fistula tract and removal of AIN lesion. I think this can all be done at the same time. We will plan on controlling her postoperative pain with oxycodone as she has done well with this in the past. We discussed typical recovery times and I recommended that she undergo no heavy lifting for approximately 3 weeks. Other risks include bleeding and pain. Recurrence rate is approximately 20%. Risk of incontinence is low.

## 2019-03-28 ENCOUNTER — Other Ambulatory Visit (HOSPITAL_COMMUNITY)
Admission: RE | Admit: 2019-03-28 | Discharge: 2019-03-28 | Disposition: A | Discharge status: Home / Self Care | Payer: Medicare HMO | Source: Ambulatory Visit | Attending: General Surgery | Admitting: General Surgery

## 2019-03-28 DIAGNOSIS — Z1159 Encounter for screening for other viral diseases: Secondary | ICD-10-CM | POA: Insufficient documentation

## 2019-03-28 LAB — SARS CORONAVIRUS 2 (TAT 6-24 HRS): SARS Coronavirus 2: NEGATIVE

## 2019-03-31 ENCOUNTER — Other Ambulatory Visit: Payer: Self-pay

## 2019-03-31 ENCOUNTER — Encounter (HOSPITAL_BASED_OUTPATIENT_CLINIC_OR_DEPARTMENT_OTHER): Payer: Self-pay | Admitting: *Deleted

## 2019-03-31 NOTE — Progress Notes (Signed)
Spoke w/ pt via phone for pre-op interview.  Npo after mn w/ exception clear liquids until 0830 then nothing by mouth.  Arrive at 1230.  Needs istat.  Current ekg in chart and epic.  Will take prilosec am dos w/ sips of water.  Pt had covid test done 03-28-2019.

## 2019-03-31 NOTE — Progress Notes (Signed)

## 2019-04-01 ENCOUNTER — Encounter (HOSPITAL_BASED_OUTPATIENT_CLINIC_OR_DEPARTMENT_OTHER)
Admission: RE | Disposition: A | Discharge status: Home / Self Care | Payer: Self-pay | Source: Home / Self Care | Attending: General Surgery

## 2019-04-01 ENCOUNTER — Other Ambulatory Visit: Payer: Self-pay

## 2019-04-01 ENCOUNTER — Ambulatory Visit (HOSPITAL_BASED_OUTPATIENT_CLINIC_OR_DEPARTMENT_OTHER): Payer: Medicare HMO | Admitting: Anesthesiology

## 2019-04-01 ENCOUNTER — Encounter (HOSPITAL_BASED_OUTPATIENT_CLINIC_OR_DEPARTMENT_OTHER): Payer: Self-pay

## 2019-04-01 ENCOUNTER — Ambulatory Visit (HOSPITAL_BASED_OUTPATIENT_CLINIC_OR_DEPARTMENT_OTHER)
Admission: RE | Admit: 2019-04-01 | Discharge: 2019-04-01 | Disposition: A | Discharge status: Home / Self Care | Payer: Medicare HMO | Attending: General Surgery | Admitting: General Surgery

## 2019-04-01 DIAGNOSIS — I739 Peripheral vascular disease, unspecified: Secondary | ICD-10-CM | POA: Insufficient documentation

## 2019-04-01 DIAGNOSIS — D013 Carcinoma in situ of anus and anal canal: Secondary | ICD-10-CM | POA: Diagnosis not present

## 2019-04-01 DIAGNOSIS — F419 Anxiety disorder, unspecified: Secondary | ICD-10-CM | POA: Diagnosis not present

## 2019-04-01 DIAGNOSIS — F329 Major depressive disorder, single episode, unspecified: Secondary | ICD-10-CM | POA: Insufficient documentation

## 2019-04-01 DIAGNOSIS — K5909 Other constipation: Secondary | ICD-10-CM | POA: Diagnosis not present

## 2019-04-01 DIAGNOSIS — I10 Essential (primary) hypertension: Secondary | ICD-10-CM | POA: Diagnosis not present

## 2019-04-01 DIAGNOSIS — K219 Gastro-esophageal reflux disease without esophagitis: Secondary | ICD-10-CM | POA: Insufficient documentation

## 2019-04-01 DIAGNOSIS — K603 Anal fistula: Secondary | ICD-10-CM | POA: Insufficient documentation

## 2019-04-01 DIAGNOSIS — Z79899 Other long term (current) drug therapy: Secondary | ICD-10-CM | POA: Insufficient documentation

## 2019-04-01 DIAGNOSIS — F17200 Nicotine dependence, unspecified, uncomplicated: Secondary | ICD-10-CM | POA: Diagnosis not present

## 2019-04-01 DIAGNOSIS — J449 Chronic obstructive pulmonary disease, unspecified: Secondary | ICD-10-CM | POA: Diagnosis not present

## 2019-04-01 HISTORY — DX: Anal fistula, unspecified: K60.30

## 2019-04-01 HISTORY — DX: Anal fistula: K60.3

## 2019-04-01 HISTORY — PX: LIGATION OF INTERNAL FISTULA TRACT: SHX6551

## 2019-04-01 HISTORY — DX: Carcinoma in situ of anus and anal canal: D01.3

## 2019-04-01 LAB — POCT I-STAT 4, (NA,K, GLUC, HGB,HCT)
Glucose, Bld: 90 mg/dL (ref 70–99)
HCT: 36 % (ref 36.0–46.0)
Hemoglobin: 12.2 g/dL (ref 12.0–15.0)
Potassium: 4 mmol/L (ref 3.5–5.1)
Sodium: 138 mmol/L (ref 135–145)

## 2019-04-01 SURGERY — LIGATION, INTERNAL FISTULA TRACT
Anesthesia: General | Site: Buttocks

## 2019-04-01 MED ORDER — PROPOFOL 10 MG/ML IV BOLUS
INTRAVENOUS | Status: DC | PRN
  Administered 2019-04-01: 200 mg via INTRAVENOUS

## 2019-04-01 MED ORDER — SCOPOLAMINE 1 MG/3DAYS TD PT72
MEDICATED_PATCH | TRANSDERMAL | Status: AC
  Filled 2019-04-01: qty 1

## 2019-04-01 MED ORDER — ACETAMINOPHEN 500 MG PO TABS
1000.0000 mg | ORAL_TABLET | Freq: Once | ORAL | Status: AC
  Administered 2019-04-01: 1000 mg via ORAL
  Filled 2019-04-01: qty 2

## 2019-04-01 MED ORDER — SUGAMMADEX SODIUM 200 MG/2ML IV SOLN
INTRAVENOUS | Status: DC | PRN
  Administered 2019-04-01: 100 mg via INTRAVENOUS

## 2019-04-01 MED ORDER — ACETIC ACID 5 % SOLN
Status: DC | PRN
  Administered 2019-04-01: 1 via TOPICAL

## 2019-04-01 MED ORDER — BUPIVACAINE-EPINEPHRINE 0.5% -1:200000 IJ SOLN
INTRAMUSCULAR | Status: DC | PRN
  Administered 2019-04-01: 20 mL

## 2019-04-01 MED ORDER — ROCURONIUM BROMIDE 10 MG/ML (PF) SYRINGE
PREFILLED_SYRINGE | INTRAVENOUS | Status: DC | PRN
  Administered 2019-04-01: 35 mg via INTRAVENOUS

## 2019-04-01 MED ORDER — LACTATED RINGERS IV SOLN
INTRAVENOUS | Status: DC
  Administered 2019-04-01: 1000 mL via INTRAVENOUS
  Administered 2019-04-01: 15:00:00 via INTRAVENOUS
  Filled 2019-04-01: qty 1000

## 2019-04-01 MED ORDER — ACETAMINOPHEN 500 MG PO TABS
ORAL_TABLET | ORAL | Status: AC
  Filled 2019-04-01: qty 2

## 2019-04-01 MED ORDER — CELECOXIB 200 MG PO CAPS
ORAL_CAPSULE | ORAL | Status: AC
  Filled 2019-04-01: qty 1

## 2019-04-01 MED ORDER — CELECOXIB 200 MG PO CAPS
200.0000 mg | ORAL_CAPSULE | ORAL | Status: AC
  Administered 2019-04-01: 200 mg via ORAL
  Filled 2019-04-01: qty 1

## 2019-04-01 MED ORDER — DEXAMETHASONE SODIUM PHOSPHATE 10 MG/ML IJ SOLN
INTRAMUSCULAR | Status: AC
  Filled 2019-04-01: qty 1

## 2019-04-01 MED ORDER — ACETIC ACID 5 % SOLN
Status: AC
  Filled 2019-04-01: qty 500

## 2019-04-01 MED ORDER — ACETAMINOPHEN 500 MG PO TABS
1000.0000 mg | ORAL_TABLET | ORAL | Status: DC
  Filled 2019-04-01: qty 2

## 2019-04-01 MED ORDER — ONDANSETRON HCL 4 MG/2ML IJ SOLN
INTRAMUSCULAR | Status: AC
  Filled 2019-04-01: qty 2

## 2019-04-01 MED ORDER — BUPIVACAINE LIPOSOME 1.3 % IJ SUSP
20.0000 mL | Freq: Once | INTRAMUSCULAR | Status: DC
  Filled 2019-04-01: qty 20

## 2019-04-01 MED ORDER — FENTANYL CITRATE (PF) 100 MCG/2ML IJ SOLN
INTRAMUSCULAR | Status: DC | PRN
  Administered 2019-04-01 (×2): 25 ug via INTRAVENOUS
  Administered 2019-04-01: 50 ug via INTRAVENOUS

## 2019-04-01 MED ORDER — OXYCODONE HCL 5 MG PO TABS: 5.0000 mg | ORAL_TABLET | ORAL | 0 refills | Status: DC | PRN

## 2019-04-01 MED ORDER — GABAPENTIN 300 MG PO CAPS
300.0000 mg | ORAL_CAPSULE | ORAL | Status: AC
  Administered 2019-04-01: 300 mg via ORAL
  Filled 2019-04-01: qty 1

## 2019-04-01 MED ORDER — SODIUM CHLORIDE 0.9% FLUSH
3.0000 mL | Freq: Two times a day (BID) | INTRAVENOUS | Status: DC
  Filled 2019-04-01: qty 3

## 2019-04-01 MED ORDER — PHENYLEPHRINE HCL (PRESSORS) 10 MG/ML IV SOLN
INTRAVENOUS | Status: DC | PRN
  Administered 2019-04-01 (×5): 80 ug via INTRAVENOUS

## 2019-04-01 MED ORDER — BUPIVACAINE LIPOSOME 1.3 % IJ SUSP
INTRAMUSCULAR | Status: DC | PRN
  Administered 2019-04-01: 20 mL

## 2019-04-01 MED ORDER — ACETAMINOPHEN 325 MG PO TABS
650.0000 mg | ORAL_TABLET | ORAL | Status: DC | PRN
  Filled 2019-04-01: qty 2

## 2019-04-01 MED ORDER — SODIUM CHLORIDE 0.9% FLUSH
3.0000 mL | INTRAVENOUS | Status: DC | PRN
  Filled 2019-04-01: qty 3

## 2019-04-01 MED ORDER — SODIUM CHLORIDE 0.9 % IV SOLN
250.0000 mL | INTRAVENOUS | Status: DC | PRN
  Filled 2019-04-01: qty 250

## 2019-04-01 MED ORDER — ROCURONIUM BROMIDE 10 MG/ML (PF) SYRINGE
PREFILLED_SYRINGE | INTRAVENOUS | Status: AC
  Filled 2019-04-01: qty 10

## 2019-04-01 MED ORDER — OXYCODONE HCL 5 MG PO TABS
ORAL_TABLET | ORAL | Status: AC
  Filled 2019-04-01: qty 1

## 2019-04-01 MED ORDER — PROMETHAZINE HCL 25 MG/ML IJ SOLN
6.2500 mg | INTRAMUSCULAR | Status: DC | PRN
  Filled 2019-04-01: qty 1

## 2019-04-01 MED ORDER — FENTANYL CITRATE (PF) 100 MCG/2ML IJ SOLN
INTRAMUSCULAR | Status: AC
  Filled 2019-04-01: qty 2

## 2019-04-01 MED ORDER — DEXAMETHASONE SODIUM PHOSPHATE 10 MG/ML IJ SOLN
INTRAMUSCULAR | Status: DC | PRN
  Administered 2019-04-01: 5 mg via INTRAVENOUS

## 2019-04-01 MED ORDER — SCOPOLAMINE 1 MG/3DAYS TD PT72
1.0000 | MEDICATED_PATCH | TRANSDERMAL | Status: DC
  Administered 2019-04-01: 1.5 mg via TRANSDERMAL
  Filled 2019-04-01: qty 1

## 2019-04-01 MED ORDER — FENTANYL CITRATE (PF) 100 MCG/2ML IJ SOLN
25.0000 ug | INTRAMUSCULAR | Status: DC | PRN
  Filled 2019-04-01: qty 1

## 2019-04-01 MED ORDER — LIDOCAINE 5 % EX OINT
TOPICAL_OINTMENT | CUTANEOUS | Status: DC | PRN
  Administered 2019-04-01: 1

## 2019-04-01 MED ORDER — OXYCODONE HCL 5 MG PO TABS
5.0000 mg | ORAL_TABLET | ORAL | Status: DC | PRN
  Administered 2019-04-01: 5 mg via ORAL
  Filled 2019-04-01: qty 2

## 2019-04-01 MED ORDER — ONDANSETRON HCL 4 MG/2ML IJ SOLN
INTRAMUSCULAR | Status: DC | PRN
  Administered 2019-04-01: 4 mg via INTRAVENOUS

## 2019-04-01 MED ORDER — GABAPENTIN 300 MG PO CAPS
ORAL_CAPSULE | ORAL | Status: AC
  Filled 2019-04-01: qty 1

## 2019-04-01 MED ORDER — ACETAMINOPHEN 650 MG RE SUPP
650.0000 mg | RECTAL | Status: DC | PRN
  Filled 2019-04-01: qty 1

## 2019-04-01 MED ORDER — PROPOFOL 10 MG/ML IV BOLUS
INTRAVENOUS | Status: AC
  Filled 2019-04-01: qty 20

## 2019-04-01 SURGICAL SUPPLY — 53 items
BENZOIN TINCTURE PRP APPL 2/3 (GAUZE/BANDAGES/DRESSINGS) ×4 IMPLANT
BLADE EXTENDED COATED 6.5IN (ELECTRODE) IMPLANT
BLADE HEX COATED 2.75 (ELECTRODE) ×2 IMPLANT
BLADE SURG 10 STRL SS (BLADE) ×2 IMPLANT
BRIEF STRETCH FOR OB PAD LRG (UNDERPADS AND DIAPERS) ×2 IMPLANT
CANISTER SUCT 3000ML PPV (MISCELLANEOUS) ×2 IMPLANT
COVER BACK TABLE 60X90IN (DRAPES) ×2 IMPLANT
COVER MAYO STAND STRL (DRAPES) ×2 IMPLANT
COVER WAND RF STERILE (DRAPES) ×2 IMPLANT
DRAPE LAPAROTOMY 100X72 PEDS (DRAPES) ×2 IMPLANT
DRAPE UTILITY XL STRL (DRAPES) ×2 IMPLANT
ELECT REM PT RETURN 9FT ADLT (ELECTROSURGICAL) ×2
ELECTRODE REM PT RTRN 9FT ADLT (ELECTROSURGICAL) ×1 IMPLANT
GAUZE SPONGE 4X4 12PLY STRL (GAUZE/BANDAGES/DRESSINGS) ×2 IMPLANT
GAUZE SPONGE 4X4 12PLY STRL LF (GAUZE/BANDAGES/DRESSINGS) ×1 IMPLANT
GLOVE BIO SURGEON STRL SZ 6.5 (GLOVE) ×2 IMPLANT
GLOVE BIOGEL PI IND STRL 7.0 (GLOVE) ×1 IMPLANT
GLOVE BIOGEL PI INDICATOR 7.0 (GLOVE) ×1
GOWN SPEC L3 XXLG W/TWL (GOWN DISPOSABLE) ×2 IMPLANT
HYDROGEN PEROXIDE 16OZ (MISCELLANEOUS) ×2 IMPLANT
IV CATH 14GX2 1/4 (CATHETERS) ×2 IMPLANT
IV CATH 18G SAFETY (IV SOLUTION) ×2 IMPLANT
KIT SIGMOIDOSCOPE (SET/KITS/TRAYS/PACK) IMPLANT
KIT TURNOVER CYSTO (KITS) ×2 IMPLANT
NEEDLE HYPO 22GX1.5 SAFETY (NEEDLE) ×2 IMPLANT
NS IRRIG 500ML POUR BTL (IV SOLUTION) ×2 IMPLANT
PACK BASIN DAY SURGERY FS (CUSTOM PROCEDURE TRAY) ×2 IMPLANT
PAD ABD 8X10 STRL (GAUZE/BANDAGES/DRESSINGS) ×2 IMPLANT
PAD ARMBOARD 7.5X6 YLW CONV (MISCELLANEOUS) IMPLANT
PENCIL BUTTON HOLSTER BLD 10FT (ELECTRODE) ×2 IMPLANT
RETRACTOR STAY HOOK 5MM (MISCELLANEOUS) ×1 IMPLANT
RETRACTOR STERILE 25.8CMX11.3 (INSTRUMENTS) ×1 IMPLANT
SPONGE SURGIFOAM ABS GEL 12-7 (HEMOSTASIS) IMPLANT
SUCTION FRAZIER HANDLE 10FR (MISCELLANEOUS) ×2
SUCTION TUBE FRAZIER 10FR DISP (MISCELLANEOUS) ×1 IMPLANT
SUT CHROMIC 2 0 SH (SUTURE) ×2 IMPLANT
SUT CHROMIC 3 0 SH 27 (SUTURE) ×2 IMPLANT
SUT SILK 2 0 (SUTURE) ×1
SUT SILK 2-0 18XBRD TIE 12 (SUTURE) ×1 IMPLANT
SUT SILK 3 0 SH 30 (SUTURE) IMPLANT
SUT VIC AB 2-0 SH 27 (SUTURE)
SUT VIC AB 2-0 SH 27XBRD (SUTURE) IMPLANT
SUT VIC AB 3-0 SH 27 (SUTURE) ×5
SUT VIC AB 3-0 SH 27X BRD (SUTURE) IMPLANT
SUT VIC AB 3-0 SH 8-18 (SUTURE) ×2 IMPLANT
SUT VICRYL 3 0 UR 6 27 (SUTURE) IMPLANT
SYR 10ML LL (SYRINGE) ×2 IMPLANT
SYR BULB IRRIGATION 50ML (SYRINGE) ×2 IMPLANT
SYR CONTROL 10ML LL (SYRINGE) ×2 IMPLANT
TOWEL OR 17X26 10 PK STRL BLUE (TOWEL DISPOSABLE) ×2 IMPLANT
TRAY DSU PREP LF (CUSTOM PROCEDURE TRAY) ×2 IMPLANT
TUBE CONNECTING 12X1/4 (SUCTIONS) ×3 IMPLANT
YANKAUER SUCT BULB TIP NO VENT (SUCTIONS) ×2 IMPLANT

## 2019-04-01 NOTE — Anesthesia Preprocedure Evaluation (Addendum)
Anesthesia Evaluation  Patient identified by MRN, date of birth, ID band Patient awake    Reviewed: Allergy & Precautions, NPO status , Patient's Chart, lab work & pertinent test results  History of Anesthesia Complications (+) PONV, Family history of anesthesia reaction and history of anesthetic complications  Airway Mallampati: I  TM Distance: >3 FB Neck ROM: Full    Dental  (+) Upper Dentures, Dental Advisory Given   Pulmonary asthma , COPD, Current Smoker,    Pulmonary exam normal        Cardiovascular hypertension, Pt. on medications + Peripheral Vascular Disease  Normal cardiovascular exam  H/o prolonged QT   Neuro/Psych  Headaches, PSYCHIATRIC DISORDERS Anxiety Depression    GI/Hepatic GERD  Medicated,(+) Hepatitis -, C  Endo/Other  negative endocrine ROS  Renal/GU negative Renal ROS     Musculoskeletal  (+) Arthritis ,   Abdominal   Peds  Hematology negative hematology ROS (+)   Anesthesia Other Findings Day of surgery medications reviewed with the patient.  Reproductive/Obstetrics                            Anesthesia Physical  Anesthesia Plan  ASA: III  Anesthesia Plan: General   Post-op Pain Management:    Induction: Intravenous  PONV Risk Score and Plan: 3 and Ondansetron, Dexamethasone and Scopolamine patch - Pre-op  Airway Management Planned: LMA and Oral ETT  Additional Equipment:   Intra-op Plan:   Post-operative Plan: Extubation in OR  Informed Consent: I have reviewed the patients History and Physical, chart, labs and discussed the procedure including the risks, benefits and alternatives for the proposed anesthesia with the patient or authorized representative who has indicated his/her understanding and acceptance.     Dental advisory given  Plan Discussed with: Anesthesiologist, CRNA and Surgeon  Anesthesia Plan Comments:        Anesthesia  Quick Evaluation

## 2019-04-01 NOTE — H&P (Signed)
History of Present Illness  The patient is a 63 year old female who presents with anal pain. Patient presented to the office with a non-resolving perirectal abscess which has been present since October 2019. She has been on multiple rounds of antibiotics and has undergone an I and D here in the office. She continued to have pain and drainage on that side. She reports chronic constipation and uses lactulose to have a bowel movement approximately once or twice a week.   She was taken to the operating room in mid January 2020. A seton was placed. She also had an anterior lesion that appeared to be AIN. This was biopsied. Since that time she has had some difficulty with anal pain. But his resolved with otc pain medications. She reports quite a bit of drainage.   Problem List/Past Medical  ANAL PAIN (K62.89)  SEVERE RT GROIN PAIN (R10.31)  She had a right inguinal node biopsy - 02/03/2014 - [facility] for benign disease. ANAL FISTULA (K60.3)  ITCHING (L29.9)   Past Surgical History Leighton Ruff, MD; 11/05/3333 12:25 PM) Colon Polyp Removal - Colonoscopy  Gallbladder Surgery - Laparoscopic  Colon Polyp Removal - Open  Hysterectomy (due to cancer) - Complete  Hemorrhoidectomy  Hysterectomy (not due to cancer) - Complete  Shoulder Surgery  Right.  Diagnostic Studies History Leighton Ruff, MD; 4/56/2563 12:25 PM) Mammogram  1-3 years ago Colonoscopy  1-5 years ago Pap Smear  1-5 years ago  Allergies Mammie Lorenzo, LPN; 8/93/7342 87:68 PM) Morphine Sulfate (PF) *ANALGESICS - OPIOID*  PROzac *ANTIDEPRESSANTS*  Tylenol *ANALGESICS - NonNarcotic*   Medication History Mammie Lorenzo, LPN; 09/24/7260 03:55 PM) Ibuprofen (200MG  Capsule, Oral) Active. Ambien (5MG  Tablet, Oral) Active. BusPIRone HCl (7.5MG  Tablet, Oral) Active. Gabapentin (300MG  Capsule, Oral) Active. Omeprazole (20MG  Capsule DR, Oral) Active. Medications Reconciled  Social History  Leighton Ruff, MD; 9/74/1638 12:25 PM) Alcohol use  Occasional alcohol use. Tobacco use  Current every day smoker, Current some day smoker. No drug use   Family History Leighton Ruff, MD; 4/53/6468 12:25 PM) Arthritis  Mother. Arthritis  Mother. Breast Cancer  Mother. Cancer  Father. Heart disease in female family member before age 36  Colon Cancer  Father. Ovarian Cancer  Mother. Melanoma  Father.  Pregnancy / Birth History Leighton Ruff, MD; 0/32/1224 12:25 PM) Age of menopause  <45 Gravida  3 Maternal age  24-20  Other Problems Leighton Ruff, MD; 05/04/36 12:25 PM) Hypercholesterolemia  High blood pressure  Oophorectomy  Bilateral.    Review of Systems Leighton Ruff MD; 0/48/8891 12:26 PM) Respiratory Present- Snoring. Not Present- Bloody sputum, Chronic Cough, Difficulty Breathing and Wheezing. Cardiovascular Present- Difficulty Breathing Lying Down and Palpitations. Not Present- Chest Pain, Leg Cramps, Rapid Heart Rate, Shortness of Breath and Swelling of Extremities. Gastrointestinal Present- Abdominal Pain and Nausea. Not Present- Bloating, Bloody Stool, Change in Bowel Habits, Chronic diarrhea, Constipation, Difficulty Swallowing, Excessive gas, Gets full quickly at meals, Hemorrhoids, Indigestion, Rectal Pain and Vomiting.  BP 121/78   Pulse 77   Temp 98 F (36.7 C) (Oral)   Resp 14   Ht 5\' 4"  (1.626 m)   Wt 45.1 kg   LMP 10/23/2002   SpO2 100%   BMI 17.06 kg/m     Physical Exam  The physical exam findings are as follows: Note: GENERAL: Thin, elderly female in no acute distress  EYES: No scleral icterus Pupils equal, lids normal  EXTERNAL EARS: Intact, no masses or lesions EXTERNAL NOSE: Intact, no masses  or lesions MOUTH: Lips - no lesions Dentition - normal for age  RESPIRATORY: Normal effort, no use of accessory muscles  MUSCULOSKELETAL: Normal gait Grossly normal ROM upper extremities Grossly normal ROM lower  extremities  SKIN: Warm and dry Not diaphoretic  PSYCHIATRIC: Normal judgement and insight Normal mood and affect Alert, oriented x 3  Rectal Note: right lateral steon in place There is no overlying erythema or fluctuance clear drainage     Assessment & Plan   ANAL FISTULA (K60.3) Impression: 63 year old female who presents to the office after seton placement for anal fistula in Jan 2020. She was also noted to have an anterior lesion. Biopsy show HGSIL. I have recommended a ligation of internal fistula tract and removal of AIN lesion. I think this can all be done at the same time. We will plan on controlling her postoperative pain with oxycodone as she has done well with this in the past. We discussed typical recovery times and I recommended that she undergo no heavy lifting for approximately 3 weeks. Other risks include bleeding and pain. Recurrence rate is approximately 20%. Risk of incontinence is low but not zero.

## 2019-04-01 NOTE — Anesthesia Procedure Notes (Signed)
Procedure Name: Intubation Date/Time: 04/01/2019 1:33 PM Performed by: Wanita Chamberlain, CRNA Pre-anesthesia Checklist: Patient being monitored, Suction available, Emergency Drugs available, Patient identified and Timeout performed Patient Re-evaluated:Patient Re-evaluated prior to induction Oxygen Delivery Method: Circle system utilized Preoxygenation: Pre-oxygenation with 100% oxygen Induction Type: IV induction Ventilation: Mask ventilation without difficulty Laryngoscope Size: Mac and 3 Grade View: Grade I Tube type: Oral Tube size: 7.0 mm Number of attempts: 1 Airway Equipment and Method: Stylet Placement Confirmation: breath sounds checked- equal and bilateral,  CO2 detector,  positive ETCO2 and ETT inserted through vocal cords under direct vision Secured at: 21 cm Dental Injury: Teeth and Oropharynx as per pre-operative assessment

## 2019-04-01 NOTE — Transfer of Care (Signed)
Immediate Anesthesia Transfer of Care Note  Patient: Amanda Bennett  Procedure(s) Performed: LIGATION OF INTERNAL FISTULA TRACT RESECTION OF ANAL LESION (N/A Buttocks)  Patient Location: PACU  Anesthesia Type:General  Level of Consciousness: awake, alert , oriented and patient cooperative  Airway & Oxygen Therapy: Patient Spontanous Breathing and Patient connected to nasal cannula oxygen  Post-op Assessment: Report given to RN and Post -op Vital signs reviewed and stable  Post vital signs: Reviewed and stable  Last Vitals:  Vitals Value Taken Time  BP    Temp    Pulse 78 04/01/19 1458  Resp    SpO2 100 % 04/01/19 1458  Vitals shown include unvalidated device data.  Last Pain:  Vitals:   04/01/19 1236  TempSrc:   PainSc: 2       Patients Stated Pain Goal: 7 (95/39/67 [id number])  Complications: No apparent anesthesia complications

## 2019-04-01 NOTE — Anesthesia Postprocedure Evaluation (Signed)
Anesthesia Post Note  Patient: Amanda Bennett  Procedure(s) Performed: LIGATION OF INTERNAL FISTULA TRACT RESECTION OF ANAL LESION (N/A Buttocks)     Patient location during evaluation: PACU Anesthesia Type: General Level of consciousness: sedated Pain management: pain level controlled Vital Signs Assessment: post-procedure vital signs reviewed and stable Respiratory status: spontaneous breathing and respiratory function stable Cardiovascular status: stable Postop Assessment: no apparent nausea or vomiting Anesthetic complications: no    Last Vitals:  Vitals:   04/01/19 1530 04/01/19 1645  BP: (!) 165/96 (!) 150/92  Pulse: 69 68  Resp: 13 16  Temp:  36.6 C  SpO2: 97% 100%    Last Pain:  Vitals:   04/01/19 1645  TempSrc:   PainSc: 8                  Tucker Minter DANIEL

## 2019-04-01 NOTE — Op Note (Signed)
04/01/2019  2:45 PM  PATIENT:  Amanda Bennett  63 y.o. female  Patient Care Team: Alvester Chou, NP as PCP - General (Nurse Practitioner)  PRE-OPERATIVE DIAGNOSIS:  anal fistula, AIN grade 3  POST-OPERATIVE DIAGNOSIS:  anal fistula, AIN grade 3  PROCEDURE:   LIGATION OF INTERNAL FISTULA TRACT  RESECTION OF ANAL LESION   Surgeon(s): Leighton Ruff, MD  ASSISTANT: none   ANESTHESIA:   local and MAC  SPECIMEN:  Source of Specimen:  anterior anal canal lesion  DISPOSITION OF SPECIMEN:  PATHOLOGY  COUNTS:  YES  PLAN OF CARE: Discharge to home after PACU  PATIENT DISPOSITION:  PACU - hemodynamically stable.  INDICATION: 63 year old female with anorectal fistula.  Seton placed in January 2020.  She is here today for ligation of internal fistula tract   OR FINDINGS: Deep anal fistula arising from posterior midline  DESCRIPTION: the patient was identified in the preoperative holding area and taken to the OR where they were laid on the operating room table.  General anesthesia was induced without difficulty. The patient was then positioned in prone jackknife position with buttocks gently taped apart.  The patient was then prepped and draped in usual sterile fashion.  SCDs were noted to be in place prior to the initiation of anesthesia. A surgical timeout was performed indicating the correct patient, procedure, positioning and need for preoperative antibiotics.  A rectal block was performed using Marcaine with epinephrine mixed with Exparel.    I began with a digital rectal exam.  The anal canal was dilated.  I then placed a Hill-Ferguson anoscope into the anal canal and evaluated this completely.  The seton was in place with internal opening in the proximal posterior midline anal canal.  I placed an S-shaped fistula probe through the fistula tract and remove the seton.  I made a small incision over the intersphincteric space overlying the fistula.  Dissection was carried down into the  intersphincteric space using mostly blunt dissection.  I dissected the tissue around the fistula tract using electrocautery.  I used a right angle clamp to dissect underneath of the fistula tract.  I then pulled to, 2-0 silk sutures through the posterior portion of the fistula tract and tied these on the internal and external portions of the fistula.  I used a knife to transect the fistula tract.  Both sides were then reinforced with interrupted 3-0 Vicryl sutures.  I used hydrogen peroxide through the external opening to test for patency.  There was a small leak noted.  2-3 3-0 Vicryl sutures were used to close down this area.  When I retested with hydrogen peroxide, there was no leak noted.  I used 2-0 chromic sutures to reapproximate the intersphincteric space.  I then used 2-0 chromic to reapproximate the anoderm in interrupted fashion.  The external opening was then enlarged using electrocautery.  I then turned my attention to the anterior anal canal lesion.  I used acetic acid to identify the borders clearly.  I then used Metzenbaum scissors to divide the anoderm.  Careful dissection was made bluntly to separate the anal Derm from the internal sphincter complex.  I then resected the entire specimen using sharp dissection.  This was sent to pathology for further evaluation.  The edges were reapproximated using interrupted 3-0 chromic suture.  Hemostasis was good.  A dressing was applied.  The patient was then awakened from anesthesia and sent to the postanesthesia care unit in stable condition.  All counts were correct per  operating room staff.

## 2019-04-01 NOTE — Discharge Instructions (Addendum)
ANORECTAL SURGERY: POST OP INSTRUCTIONS °1. Take your usually prescribed home medications unless otherwise directed. °2. DIET: During the first few hours after surgery sip on some liquids until you are able to urinate.  It is normal to not urinate for several hours after this surgery.  If you feel uncomfortable, please contact the office for instructions.  After you are able to urinate,you may eat, if you feel like it.  Follow a light bland diet the first 24 hours after arrival home, such as soup, liquids, crackers, etc.  Be sure to include lots of fluids daily (6-8 glasses).  Avoid fast food or heavy meals, as your are more likely to get nauseated.  Eat a low fat diet the next few days after surgery.  Limit caffeine intake to 1-2 servings a day. °3. PAIN CONTROL: °a. Pain is best controlled by a usual combination of several different methods TOGETHER: °i. Muscle relaxation: Soak in a warm bath (or Sitz bath) three times a day and after bowel movements.  Continue to do this until all pain is resolved. °ii. Over the counter pain medication °iii. Prescription pain medication °b. Most patients will experience some swelling and discomfort in the anus/rectal area and incisions.  Heat such as warm towels, sitz baths, warm baths, etc to help relax tight/sore spots and speed recovery.  Some people prefer to use ice, especially in the first couple days after surgery, as it may decrease the pain and swelling, or alternate between ice & heat.  Experiment to what works for you.  Swelling and bruising can take several weeks to resolve.  Pain can take even longer to completely resolve. °c. It is helpful to take an over-the-counter pain medication regularly for the first few weeks.  Choose one of the following that works best for you: °i. Naproxen (Aleve, etc)  Two 220mg tabs twice a day °ii. Ibuprofen (Advil, etc) Three 200mg tabs four times a day (every meal & bedtime) °d. A  prescription for pain medication (such as percocet,  oxycodone, hydrocodone, etc) should be given to you upon discharge.  Take your pain medication as prescribed.  °i. If you are having problems/concerns with the prescription medicine (does not control pain, nausea, vomiting, rash, itching, etc), please call us [phone] to see if we need to switch you to a different pain medicine that will work better for you and/or control your side effect better. °ii. If you need a refill on your pain medication, please contact your pharmacy.  They will contact our office to request authorization. Prescriptions will not be filled after 5 pm or on week-ends. °4. KEEP YOUR BOWELS REGULAR and AVOID CONSTIPATION °a. The goal is one to two soft bowel movements a day.  You should at least have a bowel movement every other day. °b. Avoid getting constipated.  Between the surgery and the pain medications, it is common to experience some constipation. This can be very painful after rectal surgery.  Increasing fluid intake and taking a fiber supplement (such as Metamucil, Citrucel, FiberCon, etc) 1-2 times a day regularly will usually help prevent this problem from occurring.  A stool softener like colace is also recommended.  This can be purchased over the counter at your pharmacy.  You can take it up to 3 times a day.  If you do not have a bowel movement after 24 hrs since your surgery, take one does of milk of magnesia.  If you still haven't had a bowel movement 8-12 hours after   that dose, take another dose.  If you don't have a bowel movement 48 hrs after surgery, purchase a Fleets enema from the drug store and administer gently per package instructions.  If you still are having trouble with your bowel movements after that, please call the office for further instructions. °c. If you develop diarrhea or have many loose bowel movements, simplify your diet to bland foods & liquids for a few days.  Stop any stool softeners and decrease your fiber supplement.  Switching to mild  anti-diarrheal medications (Kayopectate, Pepto Bismol) can help.  If this worsens or does not improve, please call us. ° °5. Wound Care °a. Remove your bandages before your first bowel movement or 8 hours after surgery.     °b. Remove any wound packing material at this tim,e as well.  You do not need to repack the wound unless instructed otherwise.  Wear an absorbent pad or soft cotton gauze in your underwear to catch any drainage and help keep the area clean. You should change this every 2-3 hours while awake. °c. Keep the area clean and dry.  Bathe / shower every day, especially after bowel movements.  Keep the area clean by showering / bathing over the incision / wound.   It is okay to soak an open wound to help wash it.  Wet wipes or showers / gentle washing after bowel movements is often less traumatic than regular toilet paper. °d. You may have some styrofoam-like soft packing in the rectum which will come out with the first bowel movement.  °e. You will often notice bleeding with bowel movements.  This should slow down by the end of the first week of surgery °f. Expect some drainage.  This should slow down, too, by the end of the first week of surgery.  Wear an absorbent pad or soft cotton gauze in your underwear until the drainage stops. °g. Do Not sit on a rubber or pillow ring.  This can make you symptoms worse.  You may sit on a soft pillow if needed.  °6. ACTIVITIES as tolerated:   °a. You may resume regular (light) daily activities beginning the next day--such as daily self-care, walking, climbing stairs--gradually increasing activities as tolerated.  If you can walk 30 minutes without difficulty, it is safe to try more intense activity such as jogging, treadmill, bicycling, low-impact aerobics, swimming, etc. °b. Save the most intensive and strenuous activity for last such as sit-ups, heavy lifting, contact sports, etc  Refrain from any heavy lifting or straining until you are off narcotics for pain  control.   °c. You may drive when you are no longer taking prescription pain medication, you can comfortably sit for long periods of time, and you can safely maneuver your car and apply brakes. °d. You may have sexual intercourse when it is comfortable.  °7. FOLLOW UP in our office °a. Please call [facility] at [phone] to set up an appointment to see your surgeon in the office for a follow-up appointment approximately 3-4 weeks after your surgery. °b. Make sure that you call for this appointment the day you arrive home to insure a convenient appointment time. °10. IF YOU HAVE DISABILITY OR FAMILY LEAVE FORMS, BRING THEM TO THE OFFICE FOR PROCESSING.  DO NOT GIVE THEM TO YOUR DOCTOR. ° ° ° ° °WHEN TO CALL US [phone]: °1. Poor pain control °2. Reactions / problems with new medications (rash/itching, nausea, etc)  °3. Fever over 101.5 F (38.5 C) °4.   Inability to urinate 5. Nausea and/or vomiting 6. Worsening swelling or bruising 7. Continued bleeding from incision. 8. Increased pain, redness, or drainage from the incision  The clinic staff is available to answer your questions during regular business hours (8:30am-5pm).  Please dont hesitate to call and ask to speak to one of our nurses for clinical concerns.   A surgeon from [facility] is always on call at the hospitals   If you have a medical emergency, go to the nearest emergency room or call 911.    [facility], [place], [place], [place], [place]  [address] ? MAIN: [phone]) [phone] ? TOLL FREE: [phone] ? FAX [phone] [email or web address]   Post Anesthesia Home Care Instructions  Activity: Get plenty of rest for the remainder of the day. A responsible individual must stay with you for 24 hours following the procedure.  For the next 24 hours, DO NOT: -Drive a car -Paediatric nurse -Drink alcoholic beverages -Take any medication unless instructed by your physician -Make  any legal decisions or sign important papers.  Meals: Start with liquid foods such as gelatin or soup. Progress to regular foods as tolerated. Avoid greasy, spicy, heavy foods. If nausea and/or vomiting occur, drink only clear liquids until the nausea and/or vomiting subsides. Call your physician if vomiting continues.  Special Instructions/Symptoms: Your throat may feel dry or sore from the anesthesia or the breathing tube placed in your throat during surgery. If this causes discomfort, gargle with warm salt water. The discomfort should disappear within 24 hours.  If you had a scopolamine patch placed behind your ear for the management of post- operative nausea and/or vomiting:  1. The medication in the patch is effective for 72 hours, after which it should be removed.  Wrap patch in a tissue and discard in the trash. Wash hands thoroughly with soap and water. 2. You may remove the patch earlier than 72 hours if you experience unpleasant side effects which may include dry mouth, dizziness or visual disturbances. 3. Avoid touching the patch. Wash your hands with soap and water after contact with the patch.    Information for Discharge Teaching: EXPAREL (bupivacaine liposome injectable suspension)   Your surgeon or anesthesiologist gave you EXPAREL(bupivacaine) to help control your pain after surgery.   EXPAREL is a local anesthetic that provides pain relief by numbing the tissue around the surgical site.  EXPAREL is designed to release pain medication over time and can control pain for up to 72 hours.  Depending on how you respond to EXPAREL, you may require less pain medication during your recovery.  Possible side effects:  Temporary loss of sensation or ability to move in the area where bupivacaine was injected.  Nausea, vomiting, constipation  Rarely, numbness and tingling in your mouth or lips, lightheadedness, or anxiety may occur.  Call your doctor right away if you think you  may be experiencing any of these sensations, or if you have other questions regarding possible side effects.  Follow all other discharge instructions given to you by your surgeon or nurse. Eat a healthy diet and drink plenty of water or other fluids.  If you return to the hospital for any reason within 96 hours following the administration of EXPAREL, it is important for health care providers to know that you have received this anesthetic. A teal colored band has been placed on your arm with the date, time and amount of EXPAREL you have received in order to alert  and inform your health care providers. Please leave this armband in place for the full 96 hours following administration, and then you may remove the band.

## 2019-04-02 ENCOUNTER — Encounter (HOSPITAL_BASED_OUTPATIENT_CLINIC_OR_DEPARTMENT_OTHER): Payer: Self-pay | Admitting: General Surgery

## 2019-07-30 ENCOUNTER — Emergency Department (HOSPITAL_COMMUNITY)
Admission: EM | Admit: 2019-07-30 | Discharge: 2019-07-30 | Disposition: A | Discharge status: Home / Self Care | Payer: Medicare HMO | Attending: Emergency Medicine | Admitting: Emergency Medicine

## 2019-07-30 ENCOUNTER — Other Ambulatory Visit: Payer: Self-pay

## 2019-07-30 DIAGNOSIS — W25XXXA Contact with sharp glass, initial encounter: Secondary | ICD-10-CM | POA: Diagnosis not present

## 2019-07-30 DIAGNOSIS — Z7982 Long term (current) use of aspirin: Secondary | ICD-10-CM | POA: Insufficient documentation

## 2019-07-30 DIAGNOSIS — F17210 Nicotine dependence, cigarettes, uncomplicated: Secondary | ICD-10-CM | POA: Diagnosis not present

## 2019-07-30 DIAGNOSIS — W208XXA Other cause of strike by thrown, projected or falling object, initial encounter: Secondary | ICD-10-CM | POA: Diagnosis not present

## 2019-07-30 DIAGNOSIS — Y999 Unspecified external cause status: Secondary | ICD-10-CM | POA: Insufficient documentation

## 2019-07-30 DIAGNOSIS — Y9389 Activity, other specified: Secondary | ICD-10-CM | POA: Diagnosis not present

## 2019-07-30 DIAGNOSIS — I10 Essential (primary) hypertension: Secondary | ICD-10-CM | POA: Insufficient documentation

## 2019-07-30 DIAGNOSIS — Y929 Unspecified place or not applicable: Secondary | ICD-10-CM | POA: Diagnosis not present

## 2019-07-30 DIAGNOSIS — S61511A Laceration without foreign body of right wrist, initial encounter: Secondary | ICD-10-CM | POA: Insufficient documentation

## 2019-07-30 NOTE — ED Notes (Signed)
Patient verbalizes understanding of discharge instructions. Opportunity for questioning and answers were provided. Armband removed by staff, pt discharged from ED.  

## 2019-07-30 NOTE — ED Triage Notes (Signed)
Pt here for evaluation of wrist injury that occurred last night when hanging a picture. The glass fell out of the frame and cut her wrist. This morning pt's hand and wrist is swollen and painful to touch.

## 2019-07-30 NOTE — Discharge Instructions (Addendum)
Please read instructions below.  Keep your wound clean and covered.  The Steri-Strips will fall off on their own.  Once that happens, you can gently cleanse your wound daily with soap and water and apply a clean dry bandage. Elevate your hand as much as possible to help with pain and swelling. You can take over-the-counter medications as needed for pain Follow up with your primary care or hand specialist for follow-up if symptoms do not improve. Return to the ER for fever, pus draining from wound, redness, or new or worsening symptoms.

## 2019-07-30 NOTE — ED Notes (Signed)
Cleaned patient wound with some saline patient is resting with call bell in reach

## 2019-07-30 NOTE — ED Provider Notes (Addendum)
[facility] EMERGENCY DEPARTMENT Provider Note   CSN: [id number] Arrival date & time: 07/30/19  1215     History   Chief Complaint Chief Complaint  Patient presents with  . Wrist Pain    HPI Amanda Bennett is a 63 y.o. female presenting to the emergency department with complaint of wound to right wrist that occurred yesterday while trying to hang a picture.  She states she was attempting to hang a photo and the glass fell out of the frame cutting her right wrist.  She states she applied immediate pressure and cleaned the wound.  She woke up this morning with swelling to the right hand and worsening pain to the wound.  She has not taken any medications for symptoms.  Denies history of immunocompromise or diabetes.  No numbness.  Last tetanus shot was within the year.     The history is provided by the patient.    Past Medical History:  Diagnosis Date  . AIN grade III   . Anal fistula   . Anxiety   . Bruises easily   . Cervical spinal stenosis 2008  . Chronic constipation   . Chronic pain syndrome    neck and lower back  . COPD (chronic obstructive pulmonary disease) ([place])   . Depression   . Family history of adverse reaction to anesthesia    mother--  ponv  . GERD (gastroesophageal reflux disease)   . History of basal cell carcinoma excision   . History of prolonged Q-T interval on ECG    per cardiologist, dr Fletcher Anon, note in epic-- in the setting of treatment with medications that can cause prolonged QT, went back to normal after stopping the medications (was seen by dr Caryl Comes no further workup recommended)  . 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 Hypertension   . Insomnia   . Lesion of ulnar nerve   . Mild carotid artery disease ([place])    per duplex 08-23-2017  bilateral ICA 1-39%  . OA (osteoarthritis)    neck and back  . Occlusion of left vertebral artery    noted 05-28-2017 MRI;  followed by cardiologist, dr  Fletcher Anon (per cardiologist has collaterals)  . Osteoporosis   . PONV (postoperative nausea and vomiting)    sometimes queezy  . Primary localized osteoarthrosis, pelvic region and thigh   . Wears dentures    upper  . Wears glasses     Patient Active Problem List   Diagnosis Date Noted  . Perirectal abscess 07/08/2018  . Palpitations 10/01/2014  . Tobacco abuse 07/23/2014  . Mouth lesion 07/23/2014  . Sore throat 07/23/2014  . Postoperative wound infection 02/13/2014  . Inguinal lymphadenopathy 12/30/2013  . Family history of long QT syndrome 09/18/2013  . Chest pain on exertion 09/18/2013  . Rash and nonspecific skin eruption 09/18/2013  . Leg pain 09/18/2013  . Right groin pain 06/18/2013  . Preventative health care 06/18/2013  . CAFL (chronic airflow limitation) ([place]) 04/10/2013  . BP (high blood pressure) 04/10/2013  . Closed traumatic PIP dislocation 04/07/2013  . Abnormality of gait 12/16/2012  . Asthma 08/21/2012  . Myofascial pain dysfunction syndrome 06/25/2012  . Bursitis of hip, right 04/09/2012  . Hypertension 04/09/2012  . Headache(784.0) 04/09/2012  . Hyperlipidemia 04/09/2012  . Low back pain 12/26/2011  . Lumbar spondylosis 12/26/2011  . Localized primary carpometacarpal osteoarthritis 11/07/2011  . Chronic neck pain 11/07/2011  . Chronic calculus cholecystitis 10/03/2011  .  Skin lesion of right arm 10/02/2011  . Osteoporosis 10/02/2011  . Gallstone 10/02/2011  . Hot flash, menopausal 01/17/2011  . History of hepatitis C 06/15/2010  . CARPAL TUNNEL SYNDROME, BILATERAL 12/06/2009  . GERD 06/22/2009  . Depression with anxiety 05/25/2009  . [place] DISEASE, CERVICAL 05/25/2009  . Insomnia 05/25/2009    Past Surgical History:  Procedure Laterality Date  . ABDOMINAL HYSTERECTOMY  age 30s  . CARPAL TUNNEL RELEASE Bilateral 2018  . CHOLECYSTECTOMY    . CHOLECYSTECTOMY  10/30/2011   Procedure: LAPAROSCOPIC CHOLECYSTECTOMY WITH INTRAOPERATIVE CHOLANGIOGRAM;   Surgeon: Imogene Burn. Georgette Dover, MD;  Location: [facility];  Service: General;  Laterality: N/A;  . COLONOSCOPY    . DORSAL COMPARTMENT RELEASE Left 07/20/2015   Procedure: RELEASE DORSAL COMPARTMENT (DEQUERVAIN);  Surgeon: Corky Mull, MD;  Location: [facility];  Service: Orthopedics;  Laterality: Left;  . EVALUATION UNDER ANESTHESIA WITH FISTULECTOMY N/A 10/02/2018   Procedure: ANAL EXAM UNDER ANESTHESIA;  Surgeon: Leighton Ruff, MD;  Location[facility];  Service: General;  Laterality: N/A;  . HEMORRHOID SURGERY  11/25/2006   dr Bubba Camp  @[email or web address]   . 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 INCISION AND DRAINAGE PERIRECTAL ABSCESS N/A 07/08/2018   Procedure: IRRIGATION AND DEBRIDEMENT PERIRECTAL ABSCESS;  Surgeon: Benjamine Sprague, DO;  Location: [facility];  Service: General;  Laterality: N/A;  . INGUINAL LYMPH NODE BIOPSY Right 02/03/2014   Procedure: INGUINAL LYMPH NODE BIOPSY;  Surgeon: Zenovia Jarred, MD;  Location: [facility];  Service: General;  Laterality: Right;  right inguinal area  . LAPAROSCOPIC SALPINGOOPHERECTOMY Bilateral age 63s  . LIGATION OF INTERNAL FISTULA TRACT N/A 04/01/2019   Procedure: LIGATION OF INTERNAL FISTULA TRACT RESECTION OF ANAL LESION;  Surgeon: Leighton Ruff, MD;  Location: [facility];  Service: General;  Laterality: N/A;  . ORIF CLAVICLE FRACTURE Right 2003   HARDWARE REMOVAL 08-22-2004 BY DR Veverly Fells @[facility]   . PLACEMENT OF SETON N/A 10/02/2018   Procedure: PLACEMENT OF SETON VS FISTULOTOMY, BIOPSY;  Surgeon: Leighton Ruff, MD;  Location: [facility];  Service: General;  Laterality: N/A;  . SHOULDER ARTHROSCOPY Left 07/20/2015   Procedure: ARTHROSCOPY SHOULDER DEBRIDEMENT AND DECOMPRESSION;  Surgeon: Corky Mull, MD;  Location: [facility];  Service: Orthopedics;  Laterality: Left;  . SHOULDER ARTHROSCOPY Left 01/12/2016   Procedure: ARTHROSCOPY SHOULDER  WITH DEBRIDEMENT AND EXCISION OF THE DISTAL CLAVICLE;  Surgeon: Corky Mull, MD;  Location: [facility];  Service: Orthopedics;  Laterality: Left;     OB History   No obstetric history on file.      Home Medications    Prior to Admission medications   Medication Sig Start Date End Date Taking? Authorizing Provider  aspirin EC 81 MG tablet Take 81 mg by mouth daily.    [provider]  clonazePAM (KLONOPIN) 0.5 MG tablet Take 1 mg by mouth at bedtime.  11/08/15   [provider]  ezetimibe (ZETIA) 10 MG tablet TAKE 1 TABLET BY MOUTH DAILY Patient taking differently: Take 10 mg by mouth at bedtime.  04/15/18   Wellington Hampshire, MD  ibuprofen (ADVIL,MOTRIN) 200 MG tablet Take 200 mg by mouth every 6 (six) hours as needed.    [provider]  Lactase (LACTOSE INTOLERANCE PO) Take by mouth. One teaspoon three times daily before meals    [provider]  mirtazapine (REMERON) 30 MG  tablet Take 30 mg by mouth at bedtime.    [provider]  omeprazole (PRILOSEC) 20 MG capsule Take 20 mg by mouth every morning.     [provider]  oxyCODONE (OXY IR/ROXICODONE) 5 MG immediate release tablet Take 1 tablet (5 mg total) by mouth every 4 (four) hours as needed. [id number]   Leighton Ruff, MD  valsartan (DIOVAN) 160 MG tablet Take 160 mg by mouth daily.    [provider]    Family History Family History  Problem Relation Age of Onset  . Cancer Father        colon/prostate  . Colon cancer Father 30       deceased at age 38 from colon cancer  . Cancer Paternal Grandmother        ovarian  . Stroke Neg Hx   . Stomach cancer Neg Hx     Social History Social History   Tobacco Use  . Smoking status: Current Some Day Smoker    Packs/day: 0.50    Years: 20.00    Pack years: 10.00    Types: Cigarettes  . Smokeless tobacco: Never Used  Substance Use Topics  . Alcohol use: Not Currently    Alcohol/week: 0.0 standard drinks  . Drug  use: No     Allergies   Fluoxetine hcl, Lisinopril, Tramadol, Statins, and Hydrocodone   Review of Systems Review of Systems  Musculoskeletal: Positive for myalgias.  Skin: Positive for wound.     Physical Exam Updated Vital Signs BP (!) 132/96 (BP Location: Right Arm)   Pulse 75   Temp 98.3 F (36.8 C) (Oral)   Resp 20   LMP 10/23/2002   SpO2 99%   Physical Exam Vitals signs and nursing note reviewed.  Constitutional:      Appearance: She is well-developed.  HENT:     Head: Normocephalic and atraumatic.  Eyes:     Conjunctiva/sclera: Conjunctivae normal.  Cardiovascular:     Rate and Rhythm: Normal rate.  Pulmonary:     Effort: Pulmonary effort is normal.  Musculoskeletal:     Comments: Right dorsal radial wrist with 3 cm linear laceration.  Does not appear contaminated or actively bleeding.  Wound is fairly superficial though tender to palpation.  Patient is unwilling to range the wrist much secondary to pain, however flexor tendons appear intact with palpation and wound is radial to the flexor tendons of the wrist.  Radial pulses strong and intact.  Patient is able to actively abduct the thumb without difficulty.  Normal distal sensation.  Mild swelling is present to the right hand.  No redness, warmth or drainage.  Neurological:     Mental Status: She is alert.  Psychiatric:        Mood and Affect: Mood normal.        Behavior: Behavior normal.      ED Treatments / Results  Labs (all labs ordered are listed, but only abnormal results are displayed) Labs Reviewed - No data to display  EKG None  Radiology No results found.  Procedures .Marland KitchenLaceration Repair  Date/Time: 07/30/2019 3:27 PM Performed by: , [place] N, PA-C Authorized by: , [place] N, PA-C   Consent:    Consent obtained:  Verbal   Consent given by:  Patient   Risks discussed:  Poor cosmetic result   Alternatives discussed:  No treatment Anesthesia (see MAR for exact  dosages):    Anesthesia method:  None Laceration details:    Location:  Shoulder/arm  Shoulder/arm location:  R lower arm   Length (cm):  3   Depth (mm):  1 Repair type:    Repair type:  Simple Exploration:    Wound exploration: wound explored through full range of motion and entire depth of wound probed and visualized     Wound extent: no foreign bodies/material noted and no tendon damage noted     Contaminated: no   Treatment:    Area cleansed with:  Saline   Amount of cleaning:  Standard   Visualized foreign bodies/material removed: no   Skin repair:    Repair method:  Steri-Strips   Number of Steri-Strips:  3 Approximation:    Approximation:  Close Post-procedure details:    Dressing:  Tube gauze   Patient tolerance of procedure:  Tolerated well, no immediate complications   (including critical care time)  Medications Ordered in ED Medications - No data to display   Initial Impression / Assessment and Plan / ED Course  I have reviewed the triage vital signs and the nursing notes.  Pertinent labs & imaging results that were available during my care of the patient were reviewed by me and considered in my medical decision making (see chart for details).        Patient with laceration to right wrist that occurred yesterday while trying to hang a picture.  Wound appears fairly superficial on exam, neurovascularly intact.  Low suspicion for tendon injury given reassuring exam and location of wound.  Patient has hand specialist she can follow-up with as needed.  Wound irrigated in the ED and Steri-Strips applied.  Discussed elevation, ice, over-the-counter medications as needed for pain.  No history of immunocompromise and wound does not appear contaminated, patient will not be discharged with antibiotics.  Return precautions discussed.  Patient verbalized understanding agrees with care plan for discharge.  Discussed results, findings, treatment and follow up. Patient advised  of return precautions. Patient verbalized understanding and agreed with plan.  Final Clinical Impressions(s) / ED Diagnoses   Final diagnoses:  Laceration of right wrist, initial encounter    ED Discharge Orders    None       , [place] N, PA-C 07/30/19 [place], [place] N, PA-C 07/30/19 1528    Maudie Flakes, MD 07/31/19 [phone]

## 2019-11-20 ENCOUNTER — Encounter: Payer: Self-pay | Admitting: General Practice

## 2020-02-18 ENCOUNTER — Encounter: Payer: Self-pay | Admitting: Emergency Medicine

## 2020-02-18 ENCOUNTER — Other Ambulatory Visit: Payer: Self-pay

## 2020-02-18 ENCOUNTER — Emergency Department
Admission: EM | Admit: 2020-02-18 | Discharge: 2020-02-19 | Disposition: A | Discharge status: Home / Self Care | Payer: Medicare HMO | Attending: Emergency Medicine | Admitting: Emergency Medicine

## 2020-02-18 DIAGNOSIS — I10 Essential (primary) hypertension: Secondary | ICD-10-CM | POA: Insufficient documentation

## 2020-02-18 DIAGNOSIS — J449 Chronic obstructive pulmonary disease, unspecified: Secondary | ICD-10-CM | POA: Diagnosis not present

## 2020-02-18 DIAGNOSIS — Z79899 Other long term (current) drug therapy: Secondary | ICD-10-CM | POA: Insufficient documentation

## 2020-02-18 DIAGNOSIS — F17210 Nicotine dependence, cigarettes, uncomplicated: Secondary | ICD-10-CM | POA: Diagnosis not present

## 2020-02-18 DIAGNOSIS — Z7982 Long term (current) use of aspirin: Secondary | ICD-10-CM | POA: Diagnosis not present

## 2020-02-18 DIAGNOSIS — H60312 Diffuse otitis externa, left ear: Secondary | ICD-10-CM | POA: Insufficient documentation

## 2020-02-18 DIAGNOSIS — H9202 Otalgia, left ear: Secondary | ICD-10-CM | POA: Diagnosis present

## 2020-02-18 MED ORDER — CIPROFLOXACIN-DEXAMETHASONE 0.3-0.1 % OT SUSP: 4.0000 [drp] | Freq: Two times a day (BID) | OTIC | 0 refills | Status: DC

## 2020-02-18 MED ORDER — DEXAMETHASONE SODIUM PHOSPHATE 10 MG/ML IJ SOLN
10.0000 mg | Freq: Once | INTRAMUSCULAR | Status: AC
  Administered 2020-02-18: 10 mg via INTRAMUSCULAR
  Filled 2020-02-18: qty 1

## 2020-02-18 MED ORDER — CIPROFLOXACIN-DEXAMETHASONE 0.3-0.1 % OT SUSP
4.0000 [drp] | Freq: Once | OTIC | Status: DC
  Filled 2020-02-18: qty 7.5

## 2020-02-18 MED ORDER — NAPROXEN 375 MG PO TABS: 375.0000 mg | ORAL_TABLET | Freq: Two times a day (BID) | ORAL | 0 refills | Status: DC

## 2020-02-18 MED ORDER — ONDANSETRON 4 MG PO TBDP
4.0000 mg | ORAL_TABLET | Freq: Once | ORAL | Status: AC
  Administered 2020-02-18: 4 mg via ORAL
  Filled 2020-02-18: qty 1

## 2020-02-18 NOTE — Discharge Instructions (Addendum)
You were seen today for left ear pain.  You were diagnosed with otitis externa which is a soft tissue infection of the left ear.  You received a steroid injection and antibiotic eardrops.  I am giving you prescriptions for anti-inflammatories to take twice daily as needed with food and eardrops to use twice daily for the next 7 days.  Please follow-up with your PCP if symptoms persist or worsen.

## 2020-02-18 NOTE — ED Triage Notes (Signed)
Pt reports since yesterday having left ear pain; [place] if [place] is [place] it or not; denies any recent illness

## 2020-02-18 NOTE — ED Provider Notes (Signed)
[facility] Emergency Department Provider Note ____________________________________________  Time seen: 2100  I have reviewed the triage vital signs and the nursing notes.  HISTORY  Chief Complaint  Otalgia   HPI Amanda Bennett is a 64 y.o. female presents to the ER today with complaint of left ear pain and swelling.  She reports this started 2 days ago.  She describes the pain as throbbing.  She reports she can hear a crackling sound in her ear.  She denies headache, visual changes, dizziness, runny nose, nasal congestion, sore throat, cough or shortness of breath.  She denies fever, chills or body aches.  She has not taken any medication OTC prior to arrival.  Past Medical History:  Diagnosis Date  . AIN grade III   . Anal fistula   . Anxiety   . Bruises easily   . Cervical spinal stenosis 2008  . Chronic constipation   . Chronic pain syndrome    neck and lower back  . COPD (chronic obstructive pulmonary disease) ([place])   . Depression   . Family history of adverse reaction to anesthesia    mother--  ponv  . GERD (gastroesophageal reflux disease)   . History of basal cell carcinoma excision   . History of prolonged Q-T interval on ECG    per cardiologist, dr Fletcher Anon, note in epic-- in the setting of treatment with medications that can cause prolonged QT, went back to normal after stopping the medications (was seen by dr Caryl Comes no further workup recommended)  . 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 Hypertension   . Insomnia   . Lesion of ulnar nerve   . Mild carotid artery disease ([place])    per duplex 08-23-2017  bilateral ICA 1-39%  . OA (osteoarthritis)    neck and back  . Occlusion of left vertebral artery    noted 05-28-2017 MRI;  followed by cardiologist, dr Fletcher Anon (per cardiologist has collaterals)  . Osteoporosis   . PONV (postoperative nausea and vomiting)    sometimes queezy  . Primary localized  osteoarthrosis, pelvic region and thigh   . Wears dentures    upper  . Wears glasses     Patient Active Problem List   Diagnosis Date Noted  . Perirectal abscess 07/08/2018  . Palpitations 10/01/2014  . Tobacco abuse 07/23/2014  . Mouth lesion 07/23/2014  . Sore throat 07/23/2014  . Postoperative wound infection 02/13/2014  . Inguinal lymphadenopathy 12/30/2013  . Family history of long QT syndrome 09/18/2013  . Chest pain on exertion 09/18/2013  . Rash and nonspecific skin eruption 09/18/2013  . Leg pain 09/18/2013  . Right groin pain 06/18/2013  . Preventative health care 06/18/2013  . CAFL (chronic airflow limitation) ([place]) 04/10/2013  . BP (high blood pressure) 04/10/2013  . Closed traumatic PIP dislocation 04/07/2013  . Abnormality of gait 12/16/2012  . Asthma 08/21/2012  . Myofascial pain dysfunction syndrome 06/25/2012  . Bursitis of hip, right 04/09/2012  . Hypertension 04/09/2012  . Headache(784.0) 04/09/2012  . Hyperlipidemia 04/09/2012  . Low back pain 12/26/2011  . Lumbar spondylosis 12/26/2011  . Localized primary carpometacarpal osteoarthritis 11/07/2011  . Chronic neck pain 11/07/2011  . Chronic calculus cholecystitis 10/03/2011  . Skin lesion of right arm 10/02/2011  . Osteoporosis 10/02/2011  . Gallstone 10/02/2011  . Hot flash, menopausal 01/17/2011  . History of hepatitis C 06/15/2010  . CARPAL TUNNEL SYNDROME, BILATERAL 12/06/2009  . GERD 06/22/2009  .  Depression with anxiety 05/25/2009  . [place] DISEASE, CERVICAL 05/25/2009  . Insomnia 05/25/2009    Past Surgical History:  Procedure Laterality Date  . ABDOMINAL HYSTERECTOMY  age 83s  . CARPAL TUNNEL RELEASE Bilateral 2018  . CHOLECYSTECTOMY    . CHOLECYSTECTOMY  10/30/2011   Procedure: LAPAROSCOPIC CHOLECYSTECTOMY WITH INTRAOPERATIVE CHOLANGIOGRAM;  Surgeon: Imogene Burn. Georgette Dover, MD;  Location: [facility];  Service: General;  Laterality: N/A;  . COLONOSCOPY    . DORSAL COMPARTMENT RELEASE Left  07/20/2015   Procedure: RELEASE DORSAL COMPARTMENT (DEQUERVAIN);  Surgeon: Corky Mull, MD;  Location: [place];  Service: Orthopedics;  Laterality: Left;  . EVALUATION UNDER ANESTHESIA WITH FISTULECTOMY N/A 10/02/2018   Procedure: ANAL EXAM UNDER ANESTHESIA;  Surgeon: Leighton Ruff, MD;  [facility];  Service: General;  Laterality: N/A;  . HEMORRHOID SURGERY  11/25/2006   dr Bubba Camp  @[email or web address]   . 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 INCISION AND DRAINAGE PERIRECTAL ABSCESS N/A 07/08/2018   Procedure: IRRIGATION AND DEBRIDEMENT PERIRECTAL ABSCESS;  Surgeon: Benjamine Sprague, DO;  Location: [facility];  Service: General;  Laterality: N/A;  . INGUINAL LYMPH NODE BIOPSY Right 02/03/2014   Procedure: INGUINAL LYMPH NODE BIOPSY;  Surgeon: Zenovia Jarred, MD;  Location: [place];  Service: General;  Laterality: Right;  right inguinal area  . LAPAROSCOPIC SALPINGOOPHERECTOMY Bilateral age 30s  . LIGATION OF INTERNAL FISTULA TRACT N/A 04/01/2019   Procedure: LIGATION OF INTERNAL FISTULA TRACT RESECTION OF ANAL LESION;  Surgeon: Leighton Ruff, MD;  Location: [facility];  Service: General;  Laterality: N/A;  . ORIF CLAVICLE FRACTURE Right 2003   HARDWARE REMOVAL 08-22-2004 BY DR NORRIS @[facility]   . PLACEMENT OF SETON N/A 10/02/2018   Procedure: PLACEMENT OF SETON VS FISTULOTOMY, BIOPSY;  Surgeon: Leighton Ruff, MD;  [facility];  Service: General;  Laterality: N/A;  . SHOULDER ARTHROSCOPY Left 07/20/2015   Procedure: ARTHROSCOPY SHOULDER DEBRIDEMENT AND DECOMPRESSION;  Surgeon: Corky Mull, MD;  Location: [place];  Service: Orthopedics;  Laterality: Left;  . SHOULDER ARTHROSCOPY Left 01/12/2016   Procedure: ARTHROSCOPY SHOULDER WITH DEBRIDEMENT AND EXCISION OF THE DISTAL CLAVICLE;  Surgeon: Corky Mull, MD;  Location: [facility];  Service: Orthopedics;  Laterality:  Left;    Prior to Admission medications   Medication Sig Start Date End Date Taking? Authorizing Provider  aspirin EC 81 MG tablet Take 81 mg by mouth daily.    [provider]  ciprofloxacin-dexamethasone (CIPRODEX) OTIC suspension Place 4 drops into the left ear 2 (two) times daily. 02/18/20   Jearld Fenton, NP  clonazePAM (KLONOPIN) 0.5 MG tablet Take 1 mg by mouth at bedtime.  11/08/15   [provider]  ezetimibe (ZETIA) 10 MG tablet TAKE 1 TABLET BY MOUTH DAILY Patient taking differently: Take 10 mg by mouth at bedtime.  04/15/18   Wellington Hampshire, MD  ibuprofen (ADVIL,MOTRIN) 200 MG tablet Take 200 mg by mouth every 6 (six) hours as needed.    [provider]  Lactase (LACTOSE INTOLERANCE PO) Take by mouth. One teaspoon three times daily before meals    [provider]  mirtazapine (REMERON) 30 MG tablet Take 30 mg by mouth at bedtime.    [provider]  naproxen (NAPROSYN) 375 MG tablet Take 1 tablet (375 mg total) by mouth 2 (two) times daily with a meal. 02/18/20  Jearld Fenton, NP  omeprazole (PRILOSEC) 20 MG capsule Take 20 mg by mouth every morning.     [provider]  oxyCODONE (OXY IR/ROXICODONE) 5 MG immediate release tablet Take 1 tablet (5 mg total) by mouth every 4 (four) hours as needed. 0/09/23   Leighton Ruff, MD  valsartan (DIOVAN) 160 MG tablet Take 160 mg by mouth daily.    [provider]    Allergies Fluoxetine hcl, Lisinopril, Tramadol, Statins, and Hydrocodone  Family History  Problem Relation Age of Onset  . Cancer Father        colon/prostate  . Colon cancer Father 42       deceased at age 57 from colon cancer  . Cancer Paternal Grandmother        ovarian  . Stroke Neg Hx   . Stomach cancer Neg Hx     Social History Social History   Tobacco Use  . Smoking status: Current Some Day Smoker    Packs/day: 0.50    Years: 20.00    Pack years: 10.00    Types: Cigarettes  . Smokeless  tobacco: Never Used  Vaping Use  . Vaping Use: Never used  Substance Use Topics  . Alcohol use: Not Currently    Alcohol/week: 0.0 standard drinks  . Drug use: No    Review of Systems  Constitutional: Negative for fever, chills or body aches. ENT: Positive for left ear pain and swelling.  Negative for runny nose, nasal congestion,, loss of taste or smell, sore throat. Cardiovascular: Negative for chest pain or chest tightness. Respiratory: Negative for cough or shortness of breath. Neurological: Negative for headaches, focal weakness, tingling or numbness. ____________________________________________  PHYSICAL EXAM:  VITAL SIGNS: ED Triage Vitals  Enc Vitals Group     BP 02/18/20 1916 115/78     Pulse Rate 02/18/20 1916 86     Resp 02/18/20 1916 20     Temp 02/18/20 1916 98.7 F (37.1 C)     Temp Source 02/18/20 1916 Oral     SpO2 02/18/20 1916 99 %     Weight 02/18/20 1917 105 lb (47.6 kg)     Height 02/18/20 1917 5\' 4"  (1.626 m)     Head Circumference --      Peak Flow --      Pain Score 02/18/20 1923 10     Pain Loc --      Pain Edu? --      Excl. in [place]? --     Constitutional: Alert and oriented.  Appears in pain, in no distress. Head: Normocephalic and atraumatic. Eyes: Conjunctivae are normal. PERRL. Normal extraocular movements Ears: Left canal swollen, erythematous with pus noted in the external canal.  TM intact without effusion.  No foreign body noted.  Pain with palpation of the left tragus and pulling on the left pinna. Hematological/Lymphatic/Immunological: No cervical lymphadenopathy. Cardiovascular: Normal rate, regular rhythm.  Respiratory: Normal respiratory effort. No wheezes/rales/rhonchi. Neurologic:  Normal gait without ataxia. Normal speech and language. No gross focal neurologic deficits are appreciated. ____________________________________________  INITIAL IMPRESSION / ASSESSMENT AND PLAN / ED COURSE  Left Otitis Externa:  Decadron 10 mg IM  x 1 Ciprodex 4 drops left ear x 1 RX for Ciprodex 4 drops BID  X 7 days RX for Naproxen 375 mg BID x 7 days ____________________________________________  FINAL CLINICAL IMPRESSION(S) / ED DIAGNOSES  Final diagnoses:  Acute diffuse otitis externa of left ear      Jearld Fenton, NP  02/18/20 2107    Nance Pear, MD 02/18/20 2225

## 2020-02-18 NOTE — ED Notes (Signed)
Pt thinks she has a bug in her ear. Sister states she has pain into her chest. Pt denies chest pain at this time.

## 2020-02-19 NOTE — ED Notes (Signed)
Family updated on delay and RN apologized for confusion. Pt was discharged from ED at 2255 and assisted to car via wheelchair by this RN. Pt not discharged off board due to charting delay.

## 2020-03-23 ENCOUNTER — Other Ambulatory Visit: Payer: Self-pay

## 2020-03-23 ENCOUNTER — Emergency Department (HOSPITAL_COMMUNITY)
Admission: EM | Admit: 2020-03-23 | Discharge: 2020-03-23 | Disposition: A | Discharge status: Home / Self Care | Payer: Medicare HMO | Attending: Emergency Medicine | Admitting: Emergency Medicine

## 2020-03-23 ENCOUNTER — Encounter (HOSPITAL_COMMUNITY): Payer: Self-pay | Admitting: Emergency Medicine

## 2020-03-23 DIAGNOSIS — I10 Essential (primary) hypertension: Secondary | ICD-10-CM | POA: Insufficient documentation

## 2020-03-23 DIAGNOSIS — I6529 Occlusion and stenosis of unspecified carotid artery: Secondary | ICD-10-CM | POA: Diagnosis not present

## 2020-03-23 DIAGNOSIS — F17210 Nicotine dependence, cigarettes, uncomplicated: Secondary | ICD-10-CM | POA: Diagnosis not present

## 2020-03-23 DIAGNOSIS — J449 Chronic obstructive pulmonary disease, unspecified: Secondary | ICD-10-CM | POA: Insufficient documentation

## 2020-03-23 DIAGNOSIS — Y998 Other external cause status: Secondary | ICD-10-CM | POA: Insufficient documentation

## 2020-03-23 DIAGNOSIS — Z7982 Long term (current) use of aspirin: Secondary | ICD-10-CM | POA: Diagnosis not present

## 2020-03-23 DIAGNOSIS — Z79899 Other long term (current) drug therapy: Secondary | ICD-10-CM | POA: Diagnosis not present

## 2020-03-23 DIAGNOSIS — W57XXXA Bitten or stung by nonvenomous insect and other nonvenomous arthropods, initial encounter: Secondary | ICD-10-CM | POA: Diagnosis not present

## 2020-03-23 DIAGNOSIS — S40869A Insect bite (nonvenomous) of unspecified upper arm, initial encounter: Secondary | ICD-10-CM | POA: Diagnosis not present

## 2020-03-23 DIAGNOSIS — Y9289 Other specified places as the place of occurrence of the external cause: Secondary | ICD-10-CM | POA: Insufficient documentation

## 2020-03-23 DIAGNOSIS — S1096XA Insect bite of unspecified part of neck, initial encounter: Secondary | ICD-10-CM | POA: Diagnosis not present

## 2020-03-23 DIAGNOSIS — Y9389 Activity, other specified: Secondary | ICD-10-CM | POA: Diagnosis not present

## 2020-03-23 LAB — ETHANOL: Alcohol, Ethyl (B): <10 mg/dL (ref <10)

## 2020-03-23 LAB — RAPID URINE DRUG SCREEN, HOSP PERFORMED
Amphetamines: POSITIVE — AB
Barbiturates: NOT DETECTED
Benzodiazepines: NOT DETECTED
Cocaine: NOT DETECTED
Opiates: NOT DETECTED
Tetrahydrocannabinol: NOT DETECTED

## 2020-03-23 LAB — COMPREHENSIVE METABOLIC PANEL
ALT: 21 U/L (ref 0–44)
AST: 35 U/L (ref 15–41)
Albumin: 3.9 g/dL (ref 3.5–5.0)
Alkaline Phosphatase: 103 U/L (ref 38–126)
Anion gap: 10 (ref 5–15)
BUN: 20 mg/dL (ref 8–23)
CO2: 24 mmol/L (ref 22–32)
Calcium: 8.8 mg/dL — ABNORMAL LOW (ref 8.9–10.3)
Chloride: 102 mmol/L (ref 98–111)
Creatinine, Ser: 0.73 mg/dL (ref 0.44–1.00)
GFR calc Af Amer: >60 mL/min (ref >60)
GFR calc non Af Amer: >60 mL/min (ref >60)
Glucose, Bld: 85 mg/dL (ref 70–99)
Potassium: 4.2 mmol/L (ref 3.5–5.1)
Sodium: 136 mmol/L (ref 135–145)
Total Bilirubin: 0.4 mg/dL (ref 0.3–1.2)
Total Protein: 6.5 g/dL (ref 6.5–8.1)

## 2020-03-23 LAB — URINALYSIS, ROUTINE W REFLEX MICROSCOPIC
Bacteria, UA: NONE SEEN
Bilirubin Urine: NEGATIVE
Glucose, UA: NEGATIVE mg/dL
Hgb urine dipstick: NEGATIVE
Ketones, ur: NEGATIVE mg/dL
Nitrite: NEGATIVE
Protein, ur: NEGATIVE mg/dL
Specific Gravity, Urine: 1.02 (ref 1.005–1.030)
pH: 5 (ref 5.0–8.0)

## 2020-03-23 LAB — CBC
HCT: 35.6 % — ABNORMAL LOW (ref 36.0–46.0)
Hemoglobin: 11.7 g/dL — ABNORMAL LOW (ref 12.0–15.0)
MCH: 31.3 pg (ref 26.0–34.0)
MCHC: 32.9 g/dL (ref 30.0–36.0)
MCV: 95.2 fL (ref 80.0–100.0)
Platelets: 237 10*3/uL (ref 150–400)
RBC: 3.74 MIL/uL — ABNORMAL LOW (ref 3.87–5.11)
RDW: 13.5 % (ref 11.5–15.5)
WBC: 6 10*3/uL (ref 4.0–10.5)
nRBC: 0 % (ref 0.0–0.2)

## 2020-03-23 MED ORDER — IVERMECTIN 3 MG PO TABS
200.0000 ug/kg | ORAL_TABLET | Freq: Once | ORAL | Status: DC
  Filled 2020-03-23: qty 3

## 2020-03-23 MED ORDER — HYDROXYZINE HCL 25 MG PO TABS
25.0000 mg | ORAL_TABLET | Freq: Three times a day (TID) | ORAL | 0 refills | Status: DC | PRN
Start: 2020-03-23 — End: 2020-06-20

## 2020-03-23 MED ORDER — PREDNISONE 20 MG PO TABS
40.0000 mg | ORAL_TABLET | Freq: Once | ORAL | Status: AC
  Administered 2020-03-23: 40 mg via ORAL
  Filled 2020-03-23: qty 2

## 2020-03-23 MED ORDER — IVERMECTIN 3 MG PO TABS
200.0000 ug/kg | ORAL_TABLET | Freq: Once | ORAL | Status: AC
  Administered 2020-03-23: 9000 ug via ORAL

## 2020-03-23 MED ORDER — HYDROXYZINE HCL 25 MG PO TABS
25.0000 mg | ORAL_TABLET | Freq: Once | ORAL | Status: AC
  Administered 2020-03-23: 25 mg via ORAL
  Filled 2020-03-23: qty 1

## 2020-03-23 MED ORDER — PREDNISONE 10 MG PO TABS
40.0000 mg | ORAL_TABLET | Freq: Every day | ORAL | 0 refills | Status: AC
Start: 2020-03-24 — End: 2020-03-28

## 2020-03-23 NOTE — Discharge Instructions (Addendum)
We talked about the possibility that you have a mite or insect infestation at your home.  I felt it was reasonable to treat you with Ivermectin, a short course of prednisone (steroids), and visteril for itching and anxiety.   You can put on aloe cream at home, and try to avoid picking at your skin.  Your bloodwork was otherwise reassuring and did not show signs of serious or bloodstream infections.  Your urine sample did come back positive for amphetamine.  Certain drugs like methamphetamines (meth) can cause a feeling of insects on your skin and hallucinations.  If you are using these kind of street drugs, you should make every effort to stop.

## 2020-03-23 NOTE — ED Triage Notes (Signed)
Per EMS, pt reports she, more specifically her eyes and skin have been infected w/ sandflies.  She brought them w/ her in a jar to show the provider.   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

## 2020-03-23 NOTE — ED Provider Notes (Signed)
[facility] EMERGENCY DEPARTMENT Provider Note   CSN: [id number] Arrival date & time: 03/23/20  5277     History Chief Complaint  Patient presents with  . Insect Bite    Amanda Bennett is a 64 y.o. female presenting to ED with complaint of multiple insect bites.  [place] and believes she and her daughter have been subjected to multiple sand fly bites over the past several weeks.  The patient describes painful lesions over her exposed skin involving the arm and necks, and reports she has caught the flies doing this.  She called her PCP who prescribed doxycycline and permethrin.  She completed the doxycycline course and treated with permethrin 3 days ago as instructed.  However she reports continued painful bumps on her arms and legs.  She does not believe they are particularly itchy.  She denies fevers or chills.  She reports feeling there are "worms under my skin and in my eyes."  She denies any travel outside of the country, including to [place], [place], or [place].  She denies any family members traveling to these regions.  HPI     Past Medical History:  Diagnosis Date  . AIN grade III   . Anal fistula   . Anxiety   . Bruises easily   . Cervical spinal stenosis 2008  . Chronic constipation   . Chronic pain syndrome    neck and lower back  . COPD (chronic obstructive pulmonary disease) ([place])   . Depression   . Family history of adverse reaction to anesthesia    mother--  ponv  . GERD (gastroesophageal reflux disease)   . History of basal cell carcinoma excision   . History of prolonged Q-T interval on ECG    per cardiologist, dr Fletcher Anon, note in epic-- in the setting of treatment with medications that can cause prolonged QT, went back to normal after stopping the medications (was seen by dr Caryl Comes no further workup recommended)  . History of staph infection    per pt has multiple  staph infection's  . Hypertension   . Insomnia   . Lesion of ulnar nerve   . Mild carotid artery disease ([place])    per duplex 08-23-2017  bilateral ICA 1-39%  . OA (osteoarthritis)    neck and back  . Occlusion of left vertebral artery    noted 05-28-2017 MRI;  followed by cardiologist, dr Fletcher Anon (per cardiologist has collaterals)  . Osteoporosis   . PONV (postoperative nausea and vomiting)    sometimes queezy  . Primary localized osteoarthrosis, pelvic region and thigh   . Wears dentures    upper  . Wears glasses     Patient Active Problem List   Diagnosis Date Noted  . Perirectal abscess 07/08/2018  . Palpitations 10/01/2014  . Tobacco abuse 07/23/2014  . Mouth lesion 07/23/2014  . Sore throat 07/23/2014  . Postoperative wound infection 02/13/2014  . Inguinal lymphadenopathy 12/30/2013  . Family history of long QT syndrome 09/18/2013  . Chest pain on exertion 09/18/2013  . Rash and nonspecific skin eruption 09/18/2013  . Leg pain 09/18/2013  . Right groin pain 06/18/2013  . Preventative health care 06/18/2013  . CAFL (chronic airflow limitation) ([place]) 04/10/2013  . BP (high blood pressure) 04/10/2013  . Closed traumatic PIP dislocation 04/07/2013  . Abnormality of gait 12/16/2012  . Asthma 08/21/2012  . Myofascial pain dysfunction syndrome 06/25/2012  .  Bursitis of hip, right 04/09/2012  . Hypertension 04/09/2012  . Headache(784.0) 04/09/2012  . Hyperlipidemia 04/09/2012  . Low back pain 12/26/2011  . Lumbar spondylosis 12/26/2011  . Localized primary carpometacarpal osteoarthritis 11/07/2011  . Chronic neck pain 11/07/2011  . Chronic calculus cholecystitis 10/03/2011  . Skin lesion of right arm 10/02/2011  . Osteoporosis 10/02/2011  . Gallstone 10/02/2011  . Hot flash, menopausal 01/17/2011  . History of hepatitis C 06/15/2010  . CARPAL TUNNEL SYNDROME, BILATERAL 12/06/2009  . GERD 06/22/2009  . Depression with anxiety 05/25/2009  . Andrews DISEASE, CERVICAL  05/25/2009  . Insomnia 05/25/2009    Past Surgical History:  Procedure Laterality Date  . ABDOMINAL HYSTERECTOMY  age 68s  . CARPAL TUNNEL RELEASE Bilateral 2018  . CHOLECYSTECTOMY    . CHOLECYSTECTOMY  10/30/2011   Procedure: LAPAROSCOPIC CHOLECYSTECTOMY WITH INTRAOPERATIVE CHOLANGIOGRAM;  Surgeon: Imogene Burn. Georgette Dover, MD;  Location: [facility];  Service: General;  Laterality: N/A;  . COLONOSCOPY    . DORSAL COMPARTMENT RELEASE Left 07/20/2015   Procedure: RELEASE DORSAL COMPARTMENT (DEQUERVAIN);  Surgeon: Corky Mull, MD;  Location: [facility];  Service: Orthopedics;  Laterality: Left;  . EVALUATION UNDER ANESTHESIA WITH FISTULECTOMY N/A 10/02/2018   Procedure: ANAL EXAM UNDER ANESTHESIA;  Surgeon: Leighton Ruff, MD;  Location: [facility];  Service: General;  Laterality: N/A;  . HEMORRHOID SURGERY  11/25/2006   dr Bubba Camp  @[facility]   . 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 INCISION AND DRAINAGE PERIRECTAL ABSCESS N/A 07/08/2018   Procedure: IRRIGATION AND DEBRIDEMENT PERIRECTAL ABSCESS;  Surgeon: Benjamine Sprague, DO;  Location: [facility];  Service: General;  Laterality: N/A;  . INGUINAL LYMPH NODE BIOPSY Right 02/03/2014   Procedure: INGUINAL LYMPH NODE BIOPSY;  Surgeon: Zenovia Jarred, MD;  Location: [place];  Service: General;  Laterality: Right;  right inguinal area  . LAPAROSCOPIC SALPINGOOPHERECTOMY Bilateral age 42s  . LIGATION OF INTERNAL FISTULA TRACT N/A 04/01/2019   Procedure: LIGATION OF INTERNAL FISTULA TRACT RESECTION OF ANAL LESION;  Surgeon: Leighton Ruff, MD;  Location: [facility];  Service: General;  Laterality: N/A;  . ORIF CLAVICLE FRACTURE Right 2003   HARDWARE REMOVAL 08-22-2004 BY DR NORRIS @[facility]   . PLACEMENT OF SETON N/A 10/02/2018   Procedure: PLACEMENT OF SETON VS FISTULOTOMY, BIOPSY;  Surgeon: Leighton Ruff, MD;  Location: [facility];  Service: General;   Laterality: N/A;  . SHOULDER ARTHROSCOPY Left 07/20/2015   Procedure: ARTHROSCOPY SHOULDER DEBRIDEMENT AND DECOMPRESSION;  Surgeon: Corky Mull, MD;  Location: [facility];  Service: Orthopedics;  Laterality: Left;  . SHOULDER ARTHROSCOPY Left 01/12/2016   Procedure: ARTHROSCOPY SHOULDER WITH DEBRIDEMENT AND EXCISION OF THE DISTAL CLAVICLE;  Surgeon: Corky Mull, MD;  Location: [facility];  Service: Orthopedics;  Laterality: Left;     OB History   No obstetric history on file.     Family History  Problem Relation Age of Onset  . Cancer Father        colon/prostate  . Colon cancer Father 4       deceased at age 64 from colon cancer  . Cancer Paternal Grandmother        ovarian  . Stroke Neg Hx   . Stomach cancer Neg Hx     Social History   Tobacco Use  . Smoking status: Current Some Day Smoker    Packs/day: 0.50  Years: 20.00    Pack years: 10.00    Types: Cigarettes  . Smokeless tobacco: Never Used  Vaping Use  . Vaping Use: Never used  Substance Use Topics  . Alcohol use: Not Currently    Alcohol/week: 0.0 standard drinks  . Drug use: No    Home Medications Prior to Admission medications   Medication Sig Start Date End Date Taking? Authorizing Provider  aspirin EC 81 MG tablet Take 81 mg by mouth daily.    [provider]  ciprofloxacin-dexamethasone (CIPRODEX) OTIC suspension Place 4 drops into the left ear 2 (two) times daily. 02/18/20   Jearld Fenton, NP  clonazePAM (KLONOPIN) 0.5 MG tablet Take 1 mg by mouth at bedtime.  11/08/15   [provider]  ezetimibe (ZETIA) 10 MG tablet TAKE 1 TABLET BY MOUTH DAILY Patient taking differently: Take 10 mg by mouth at bedtime.  04/15/18   Wellington Hampshire, MD  hydrOXYzine (ATARAX/VISTARIL) 25 MG tablet Take 1 tablet (25 mg total) by mouth every 8 (eight) hours as needed for up to 10 doses. 03/23/20   Wyvonnia Dusky, MD  ibuprofen (ADVIL,MOTRIN) 200 MG tablet Take 200 mg by mouth every 6 (six)  hours as needed.    [provider]  Lactase (LACTOSE INTOLERANCE PO) Take by mouth. One teaspoon three times daily before meals    [provider]  mirtazapine (REMERON) 30 MG tablet Take 30 mg by mouth at bedtime.    [provider]  naproxen (NAPROSYN) 375 MG tablet Take 1 tablet (375 mg total) by mouth 2 (two) times daily with a meal. 02/18/20   Baity, Coralie Keens, NP  omeprazole (PRILOSEC) 20 MG capsule Take 20 mg by mouth every morning.     [provider]  oxyCODONE (OXY IR/ROXICODONE) 5 MG immediate release tablet Take 1 tablet (5 mg total) by mouth every 4 (four) hours as needed. 1/32/44   Leighton Ruff, MD  predniSONE (DELTASONE) 10 MG tablet Take 4 tablets (40 mg total) by mouth daily for 4 doses. 03/24/20 03/28/20  Wyvonnia Dusky, MD  valsartan (DIOVAN) 160 MG tablet Take 160 mg by mouth daily.    [provider]    Allergies    Fluoxetine hcl, Lisinopril, Tramadol, Statins, and Hydrocodone  Review of Systems   Review of Systems  Constitutional: Negative for chills and fever.  HENT: Negative for ear pain and sore throat.   Eyes: Negative for pain and visual disturbance.  Respiratory: Negative for cough and shortness of breath.   Cardiovascular: Negative for chest pain and palpitations.  Gastrointestinal: Negative for abdominal pain and vomiting.  Musculoskeletal: Negative for arthralgias and back pain.  Skin: Positive for rash and wound.  Neurological: Negative for syncope and weakness.  Psychiatric/Behavioral: Negative for agitation and confusion.  All other systems reviewed and are negative.   Physical Exam Updated Vital Signs BP 119/69 (BP Location: Left Arm)   Pulse 65   Temp (!) 97.5 F (36.4 C) (Oral)   Resp 18   Ht 5\' 4"  (1.626 m)   Wt 45.4 kg   LMP 10/23/2002   SpO2 99%   BMI 17.16 kg/m   Physical Exam Vitals and nursing note reviewed.  Constitutional:      General: She is not in acute distress.     Appearance: She is well-developed.  HENT:     Head: Normocephalic and atraumatic.  Eyes:     Extraocular Movements: Extraocular movements intact.     Conjunctiva/sclera:  Conjunctivae normal.     Pupils: Pupils are equal, round, and reactive to light.  Cardiovascular:     Rate and Rhythm: Normal rate and regular rhythm.     Pulses: Normal pulses.  Pulmonary:     Effort: Pulmonary effort is normal. No respiratory distress.     Breath sounds: Normal breath sounds.  Musculoskeletal:     Cervical back: Neck supple.  Skin:    General: Skin is warm and dry.     Comments: Multiple small pock-mark lesions of the bilateral arms and upper neck No visible mites or insects on skin exam Bedside jar with tuft of hair containing 2 small fly-like insects  Neurological:     Mental Status: She is alert.     ED Results / Procedures / Treatments   Labs (all labs ordered are listed, but only abnormal results are displayed) Labs Reviewed  COMPREHENSIVE METABOLIC PANEL - Abnormal; Notable for the following components:      Result Value   Calcium 8.8 (*)    All other components within normal limits  CBC - Abnormal; Notable for the following components:   RBC 3.74 (*)    Hemoglobin 11.7 (*)    HCT 35.6 (*)    All other components within normal limits  RAPID URINE DRUG SCREEN, HOSP PERFORMED - Abnormal; Notable for the following components:   Amphetamines POSITIVE (*)    All other components within normal limits  URINALYSIS, ROUTINE W REFLEX MICROSCOPIC - Abnormal; Notable for the following components:   APPearance HAZY (*)    Leukocytes,Ua TRACE (*)    All other components within normal limits  ETHANOL    EKG None  Radiology No results found.  Procedures Procedures (including critical care time)  Medications Ordered in ED Medications  predniSONE (DELTASONE) tablet 40 mg (40 mg Oral Given 03/23/20 1201)  hydrOXYzine (ATARAX/VISTARIL) tablet 25 mg (25 mg Oral Given 03/23/20 1201)   ivermectin (STROMECTOL) tablet 9,000 mcg (9,000 mcg Oral Given 03/23/20 1241)    ED Course  I have reviewed the triage vital signs and the nursing notes.  Pertinent labs & imaging results that were available during my care of the patient were reviewed by me and considered in my medical decision making (see chart for details).  64 yo female presenting to ED with complaint of sand fly bites.  She is worried about a parasitic infection.  She has not traveled to any regions of the world where leishmaniasis is endemic, and therefore I have a lower overall suspicion for this.  I suspect these may be other biting insects, like horse flies, causing these lesions. It doesn't appear to be scabies per the pattern, but she has completed permethrin treatment already.  I think a dose of ivermectin 200 mcg/kg here and a short course of steroids would be reasonable as additional coverage for scabies.  I advised that if it was scabies it can take several days to shed the mites after treatment.    Her daughter, it appears, has been hospitalized and is being treated for hallucinations.  It is possible that this is all tactile hallucinations from methamphatamine use, given the patient's UDS results.  However she seems fairly focused and organized in her thinking, and I think treatment for organic disease is reasonable.  I see no evidence of ocular involvement on my exam.  I personally reviewed her labs which showed no leukocytosis to suggest sepsis or blood infection.    No further evidence of cellulitis on my  exam.  Okay for discharge.  Discussed aloe cream and strongly encouraged her to not pick at her skin (or her eyes).     Final Clinical Impression(s) / ED Diagnoses Final diagnoses:  Insect bite, unspecified site, initial encounter    Rx / DC Orders ED Discharge Orders         Ordered    predniSONE (DELTASONE) 10 MG tablet  Daily     Discontinue  Reprint     03/23/20 1152    hydrOXYzine  (ATARAX/VISTARIL) 25 MG tablet  Every 8 hours PRN     Discontinue  Reprint     03/23/20 1152           Trifan, Carola Rhine, MD 03/23/20 1720

## 2020-06-20 ENCOUNTER — Ambulatory Visit (INDEPENDENT_AMBULATORY_CARE_PROVIDER_SITE_OTHER): Payer: Medicare HMO | Admitting: Internal Medicine

## 2020-06-20 ENCOUNTER — Encounter: Payer: Self-pay | Admitting: Internal Medicine

## 2020-06-20 VITALS — BP 110/74 | HR 75 | Ht 64.0 in | Wt 110.8 lb

## 2020-06-20 DIAGNOSIS — R194 Change in bowel habit: Secondary | ICD-10-CM | POA: Diagnosis not present

## 2020-06-20 DIAGNOSIS — R159 Full incontinence of feces: Secondary | ICD-10-CM | POA: Diagnosis not present

## 2020-06-20 DIAGNOSIS — K6289 Other specified diseases of anus and rectum: Secondary | ICD-10-CM | POA: Diagnosis not present

## 2020-06-20 DIAGNOSIS — K6282 Dysplasia of anus: Secondary | ICD-10-CM

## 2020-06-20 HISTORY — DX: Dysplasia of anus: K62.82

## 2020-06-20 NOTE — Progress Notes (Signed)
KALEEA PENNER 64 y.o. 03/03/1956 [id number]  Assessment & Plan:   Encounter Diagnoses  Name Primary?  Marland Kitchen Anal intraepithelial neoplasia Yes  . Rectal pain   . Altered bowel habits   . Full incontinence of feces     She needs continued follow-up from a surgeon so will refer to [facility] at the patient's request.  They can also evaluate for her postoperative symptoms of pain etc.  I explained that this is not my area of expertise since this is an anal lesion.  Continue lactulose to promote regular defecation  Referred for pelvic floor physical therapy to see if that helps her altered bowel habits and defecation issues status post anorectal surgeries  Change colonoscopy recall/routine repeat in 2028 down now her father had melanoma not colon cancer   I appreciate the opportunity to care for this patient. CC: Alvester Chou, NP  Subjective:   Chief Complaint: Rectal pain lower abdominal pain after anorectal surgery  HPI Patient is here for follow-up due to rectal pain bowel habit issues and abdominal pain issues, she had a nonhealing perirectal abscess and had this treated surgically by Dr. 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  Abscess began in October 2019 she had incision and drainage she had antibiotics and because it did not resolve she was taken to the operating room in January 2020 and had a seton placed and that is when she had the AIN biopsy results.  She had actually been seen at [facility] in October and had incision and drainage from a surgeon there also.  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  She had a follow-up visit in January of this year and there was no evidence of recurrence with plans for a 76-month follow-up.  The patient tells me she is dissatisfied with the care she  received through Dr. Marcello Moores and will not go back.  She said there was not adequate communication and she is having constant rectal pain worse with defecation and lower abdominal pain as well.  She has had some difficulty with defecation with fecal incontinence and constipation as well.  Lactulose is being used fairly regularly as recommended by her PCP and that has helped with pain though it has not relieved it completely it has limited if not stop fecal incontinence that she would have.  When she walks she has pain.  She has not been able to ride horses either since surgery.  Additionally we thought she had a family history of colon cancer but it turns out her father had melanoma metastatic to the bowel and not colon cancer (learned through the patient's sister).  She has had colonoscopies in 2013 in 2018 without colorectal neoplasia though she does have left-sided diverticulosis. Allergies  Allergen Reactions  . Fluoxetine Hcl Other (See Comments)    Suicidal   . Lisinopril Cough  . Tramadol     Nervous, anxiety  . Statins     Severe myalgia with Atorvastatin and Rosuvastatin  . Hydrocodone Nausea Only and Rash    Shaking, nervous   Current Meds  Medication Sig  . aspirin EC 81 MG tablet Take 81 mg by mouth daily.  . clonazePAM (KLONOPIN) 0.5 MG tablet Take 1 mg by mouth at bedtime.   . diclofenac Sodium (VOLTAREN) 1 % GEL Apply 4 g  topically 2 (two) times daily.   Mariane Baumgarten Sodium (DSS) 100 MG CAPS Take 1 capsule by mouth 3 (three) times daily.   Marland Kitchen ibuprofen (ADVIL,MOTRIN) 200 MG tablet Take 200 mg by mouth every 6 (six) hours as needed.  . Lactase (LACTOSE INTOLERANCE PO) Take by mouth. One teaspoon three times daily before meals  . mirtazapine (REMERON) 30 MG tablet Take 30 mg by mouth at bedtime.  Marland Kitchen omeprazole (PRILOSEC) 20 MG capsule Take 20 mg by mouth every morning.   . pregabalin (LYRICA) 150 MG capsule Take 150 mg by mouth as needed. May take 1-2 tablets at night  . valsartan  (DIOVAN) 160 MG tablet Take 160 mg by mouth daily.   Past Medical History:  Diagnosis Date  . AIN grade III   . Anal fistula   . Anxiety   . Bruises easily   . Cervical spinal stenosis 2008  . Chronic constipation   . Chronic pain syndrome    neck and lower back  . COPD (chronic obstructive pulmonary disease) ([place])   . Depression   . Family history of adverse reaction to anesthesia    mother--  ponv  . GERD (gastroesophageal reflux disease)   . History of basal cell carcinoma excision   . History of prolonged Q-T interval on ECG    per cardiologist, dr Fletcher Anon, note in epic-- in the setting of treatment with medications that can cause prolonged QT, went back to normal after stopping the medications (was seen by dr Caryl Comes no further workup recommended)  . 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 Hypertension   . Insomnia   . Lesion of ulnar nerve   . Mild carotid artery disease ([place])    per duplex 08-23-2017  bilateral ICA 1-39%  . OA (osteoarthritis)    neck and back  . Occlusion of left vertebral artery    noted 05-28-2017 MRI;  followed by cardiologist, dr Fletcher Anon (per cardiologist has collaterals)  . Osteoporosis   . PONV (postoperative nausea and vomiting)    sometimes queezy  . Primary localized osteoarthrosis, pelvic region and thigh   . Wears dentures    upper  . Wears glasses    Past Surgical History:  Procedure Laterality Date  . ABDOMINAL HYSTERECTOMY  age 47s  . CARPAL TUNNEL RELEASE Bilateral 2018  . CHOLECYSTECTOMY    . CHOLECYSTECTOMY  10/30/2011   Procedure: LAPAROSCOPIC CHOLECYSTECTOMY WITH INTRAOPERATIVE CHOLANGIOGRAM;  Surgeon: Imogene Burn. Georgette Dover, MD;  Location[facility];  Service: General;  Laterality: N/A;  . COLONOSCOPY    . DORSAL COMPARTMENT RELEASE Left 07/20/2015   Procedure: RELEASE DORSAL COMPARTMENT (DEQUERVAIN);  Surgeon: Corky Mull, MD;  Location: [place];  Service: Orthopedics;  Laterality: Left;  .  EVALUATION UNDER ANESTHESIA WITH FISTULECTOMY N/A 10/02/2018   Procedure: ANAL EXAM UNDER ANESTHESIA;  Surgeon: Leighton Ruff, MD;  Location: [facility];  Service: General;  Laterality: N/A;  . HEMORRHOID SURGERY  11/25/2006   dr Bubba Camp  @[facility]   . 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 INCISION AND DRAINAGE PERIRECTAL ABSCESS N/A 07/08/2018   Procedure: IRRIGATION AND DEBRIDEMENT PERIRECTAL ABSCESS;  Surgeon: Benjamine Sprague, DO;  Location: [facility];  Service: General;  Laterality: N/A;  . INGUINAL LYMPH NODE BIOPSY Right 02/03/2014   Procedure: INGUINAL LYMPH NODE BIOPSY;  Surgeon: Zenovia Jarred, MD;  Location: [facility];  Service: General;  Laterality: Right;  right inguinal area  . LAPAROSCOPIC SALPINGOOPHERECTOMY Bilateral age 80s  . LIGATION OF INTERNAL FISTULA TRACT N/A 04/01/2019   Procedure: LIGATION OF INTERNAL FISTULA TRACT RESECTION OF ANAL LESION;  Surgeon: Leighton Ruff, MD;  Location: [facility];  Service: General;  Laterality: N/A;  . ORIF CLAVICLE FRACTURE Right 2003   HARDWARE REMOVAL 08-22-2004 BY DR NORRIS @[facility]   . PLACEMENT OF SETON N/A 10/02/2018   Procedure: PLACEMENT OF SETON VS FISTULOTOMY, BIOPSY;  Surgeon: Leighton Ruff, MD;  Location: [facility];  Service: General;  Laterality: N/A;  . SHOULDER ARTHROSCOPY Left 07/20/2015   Procedure: ARTHROSCOPY SHOULDER DEBRIDEMENT AND DECOMPRESSION;  Surgeon: Corky Mull, MD;  Location: [place];  Service: Orthopedics;  Laterality: Left;  . SHOULDER ARTHROSCOPY Left 01/12/2016   Procedure: ARTHROSCOPY SHOULDER WITH DEBRIDEMENT AND EXCISION OF THE DISTAL CLAVICLE;  Surgeon: Corky Mull, MD;  Location: [facility];  Service: Orthopedics;  Laterality: Left;   Social History   Social History Narrative   Disabled   2 daughters   Reports single marital status   Smoker, 2 caffeinated beverages a day no other tobacco no  drug use no alcohol   family history includes Cancer in her father and paternal grandmother; Colon cancer (age of onset: 49) in her father; Esophageal cancer in her father; Liver disease in her father; Pancreatic cancer in her father.   Review of Systems As per HPI  Objective:   Physical Exam BP 110/74   Pulse 75   Ht 5\' 4"  (1.626 m)   Wt 110 lb 12.8 oz (50.3 kg)   LMP 10/23/2002   SpO2 99%   BMI 19.02 kg/m    Patti Martinique, CMA present  Inspection of the anoderm shows a right posterior scar tissue, has another smaller question tag/external hemorrhoid tissue.  No rash.  Digital exam shows a normal resting anal tone and a good voluntary squeeze.  I detect no masses.  She reports tenderness in the right posterior area to palpation.  No corresponding lesion noted.

## 2020-06-20 NOTE — Patient Instructions (Signed)
Normal BMI (Body Mass Index- based on height and weight) is between 19 and 25. Your BMI today is Body mass index is 19.02 kg/m. Marland Kitchen Please consider follow up  regarding your BMI with your Primary Care Provider.   Due to recent changes in healthcare laws, you may see the results of your imaging and laboratory studies on MyChart before your provider has had a chance to review them.  We understand that in some cases there may be results that are confusing or concerning to you. Not all laboratory results come back in the same time frame and the provider may be waiting for multiple results in order to interpret others.  Please give [place] 48 hours in order for your provider to thoroughly review all the results before contacting the office for clarification of your results.    We have placed a referral for pelvic floor physical therapy, they will contact you about making an appointment in [place].  We will make a [facility] referral on your behalf.  We are going to change your colon recall in our system to be 10 years since your last one.   I appreciate the opportunity to care for you. Silvano Rusk, MD. Marval Regal

## 2020-06-28 ENCOUNTER — Telehealth: Payer: Self-pay | Admitting: Internal Medicine

## 2020-06-28 NOTE — Telephone Encounter (Signed)
Patient provided the number to [facility] and [facility].  Her horse stepped on her phone and is having to use alternate communications.  She is worried about missing calls.

## 2020-11-17 ENCOUNTER — Ambulatory Visit (INDEPENDENT_AMBULATORY_CARE_PROVIDER_SITE_OTHER): Payer: Medicare HMO | Admitting: Internal Medicine

## 2020-11-17 ENCOUNTER — Encounter: Payer: Self-pay | Admitting: Internal Medicine

## 2020-11-17 VITALS — BP 128/82 | HR 80 | Ht 63.0 in | Wt 121.0 lb

## 2020-11-17 DIAGNOSIS — K6289 Other specified diseases of anus and rectum: Secondary | ICD-10-CM

## 2020-11-17 DIAGNOSIS — R159 Full incontinence of feces: Secondary | ICD-10-CM | POA: Diagnosis not present

## 2020-11-17 DIAGNOSIS — R195 Other fecal abnormalities: Secondary | ICD-10-CM | POA: Diagnosis not present

## 2020-11-17 DIAGNOSIS — R194 Change in bowel habit: Secondary | ICD-10-CM | POA: Diagnosis not present

## 2020-11-17 NOTE — Progress Notes (Signed)
Amanda Bennett 65 y.o. 06/06/1956 [id number]  Assessment & Plan:   Encounter Diagnoses  Name Primary?  . Full incontinence of feces Yes  . Altered bowel habits   . Rectal pain   . Mucus in stool     Schedule colonoscopy to try to determine the cause of her problems i.e. is there anything more proximal.  I do not think she should give up on pelvic floor physical therapy but we need to do the colonoscopy first.  I appreciate the opportunity to care for this patient. CC: Alvester Chou, NP  Subjective:   Chief Complaint:  HPI The patient is a 65 year old woman I saw in October of last year, and referred to colorectal surgery for follow-up of anal intraepithelial neoplasia resection.  At that time her complaints were fecal incontinence, rectal pain, constipation.  She was using lactulose with some benefit.  There was a question of family history of colon cancer but it was metastatic melanoma.  I did not think she needed a colonoscopy at that time.  She did go to [facility] and saw colorectal surgery, note reviewed.  Dr. Vicenta Dunning saw the patient, and anoscopy was performed and showed no visible lesions but mucus-like drainage in the rectal vault and he recommended a colonoscopy.  It was to be done at [facility] but the patient decided to return here to schedule.  Now she is having more of a diarrhea and persistent drainage of mucus.  She says her rectum burns when she is passing mucus and "pus".  No bleeding described.  Recall that she had nonhealing perianal abscess treated in the past before diagnosis of anal intraepithelial neoplasia.  I had recommended pelvic floor physical therapy and she says she went to the [facility] once and said "I can control my muscles" and that is not going to help me.  I do not see records in the chart of pelvic PT visit.  Allergies  Allergen Reactions  . Fluoxetine Hcl Other (See Comments)    Suicidal   . Lisinopril Cough  . Tramadol     Nervous,  anxiety  . Statins     Severe myalgia with Atorvastatin and Rosuvastatin  . Hydrocodone Nausea Only and Rash    Shaking, nervous   Current Meds  Medication Sig  . aspirin EC 81 MG tablet Take 81 mg by mouth daily.  . clonazePAM (KLONOPIN) 0.5 MG tablet Take 1 mg by mouth at bedtime.  . diclofenac Sodium (VOLTAREN) 1 % GEL Apply 4 g topically 2 (two) times daily.   Marland Kitchen ibuprofen (ADVIL,MOTRIN) 200 MG tablet Take 200 mg by mouth every 6 (six) hours as needed.  . Lactase (LACTOSE INTOLERANCE PO) Take by mouth. One teaspoon three times daily before meals  . mirtazapine (REMERON) 30 MG tablet Take 30 mg by mouth at bedtime.  Marland Kitchen omeprazole (PRILOSEC) 20 MG capsule Take 20 mg by mouth every morning.   . pregabalin (LYRICA) 150 MG capsule Take 150 mg by mouth as needed. May take 1-2 tablets at night  . valsartan (DIOVAN) 160 MG tablet Take 160 mg by mouth daily.  . [DISCONTINUED] Docusate Sodium (DSS) 100 MG CAPS Take 1 capsule by mouth 3 (three) times daily.    Past Medical History:  Diagnosis Date  . AIN grade III   . Anal fistula   . 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 Anxiety   . Bruises easily   . Cervical spinal stenosis 2008  . Chronic constipation   .  Chronic pain syndrome    neck and lower back  . COPD (chronic obstructive pulmonary disease) ([place])   . Depression   . Family history of adverse reaction to anesthesia    mother--  ponv  . GERD (gastroesophageal reflux disease)   . History of basal cell carcinoma excision   . History of prolonged Q-T interval on ECG    per cardiologist, dr Fletcher Anon, note in epic-- in the setting of treatment with medications that can cause prolonged QT, went back to normal after stopping the medications (was seen by dr Caryl Comes no further workup recommended)  . 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 Hypertension   . Insomnia   . Lesion of ulnar nerve   . Mild carotid artery disease ([place])    per duplex 08-23-2017   bilateral ICA 1-39%  . OA (osteoarthritis)    neck and back  . Occlusion of left vertebral artery    noted 05-28-2017 MRI;  followed by cardiologist, dr Fletcher Anon (per cardiologist has collaterals)  . Osteoporosis   . Perirectal abscess 07/08/2018  . PONV (postoperative nausea and vomiting)    sometimes queezy  . Primary localized osteoarthrosis, pelvic region and thigh   . Wears dentures    upper  . Wears glasses    Past Surgical History:  Procedure Laterality Date  . ABDOMINAL HYSTERECTOMY  age 52s  . CARPAL TUNNEL RELEASE Bilateral 2018  . CHOLECYSTECTOMY    . CHOLECYSTECTOMY  10/30/2011   Procedure: LAPAROSCOPIC CHOLECYSTECTOMY WITH INTRAOPERATIVE CHOLANGIOGRAM;  Surgeon: Imogene Burn. Georgette Dover, MD;  Location: [facility];  Service: General;  Laterality: N/A;  . COLONOSCOPY    . DORSAL COMPARTMENT RELEASE Left 07/20/2015   Procedure: RELEASE DORSAL COMPARTMENT (DEQUERVAIN);  Surgeon: Corky Mull, MD;  Location: [facility];  Service: Orthopedics;  Laterality: Left;  . EVALUATION UNDER ANESTHESIA WITH FISTULECTOMY N/A 10/02/2018   Procedure: ANAL EXAM UNDER ANESTHESIA;  Surgeon: Leighton Ruff, MD;  [facility];  Service: General;  Laterality: N/A;  . HEMORRHOID SURGERY  11/25/2006   dr Bubba Camp  @[facility]   . 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 INCISION AND DRAINAGE PERIRECTAL ABSCESS N/A 07/08/2018   Procedure: IRRIGATION AND DEBRIDEMENT PERIRECTAL ABSCESS;  Surgeon: Benjamine Sprague, DO;  Location: [facility];  Service: General;  Laterality: N/A;  . INGUINAL LYMPH NODE BIOPSY Right 02/03/2014   Procedure: INGUINAL LYMPH NODE BIOPSY;  Surgeon: Zenovia Jarred, MD;  Location: [place];  Service: General;  Laterality: Right;  right inguinal area  . LAPAROSCOPIC SALPINGOOPHERECTOMY Bilateral age 73s  . LIGATION OF INTERNAL FISTULA TRACT N/A 04/01/2019   Procedure: LIGATION OF INTERNAL FISTULA TRACT RESECTION OF  ANAL LESION;  Surgeon: Leighton Ruff, MD;  Location: [facility];  Service: General;  Laterality: N/A;  . ORIF CLAVICLE FRACTURE Right 2003   HARDWARE REMOVAL 08-22-2004 BY DR NORRIS @[facility]   . PLACEMENT OF SETON N/A 10/02/2018   Procedure: PLACEMENT OF SETON VS FISTULOTOMY, BIOPSY;  Surgeon: Leighton Ruff, MD;  Location: [facility];  Service: General;  Laterality: N/A;  . SHOULDER ARTHROSCOPY Left 07/20/2015   Procedure: ARTHROSCOPY SHOULDER DEBRIDEMENT AND DECOMPRESSION;  Surgeon: Corky Mull, MD;  Location: [place];  Service: Orthopedics;  Laterality: Left;  . SHOULDER ARTHROSCOPY Left 01/12/2016   Procedure: ARTHROSCOPY SHOULDER WITH DEBRIDEMENT AND EXCISION OF THE DISTAL CLAVICLE;  Surgeon: Corky Mull, MD;  Location: [facility];  Service: Orthopedics;  Laterality: Left;   Social History   Social History Narrative   Disabled   2 daughters   Reports single marital status   Smoker, 2 caffeinated beverages a day no other tobacco no drug use no alcohol   family history includes Cancer in her father and paternal grandmother; Colon cancer (age of onset: 76) in her father; Esophageal cancer in her father; Liver disease in her father; Pancreatic cancer in her father.   Review of Systems As per HPI  Objective:   Physical Exam BP 128/82 (BP Location: Left Arm, Patient Position: Sitting, Cuff Size: Normal)   Pulse 80   Ht 5\' 3"  (1.6 m) Comment: height measured without shoes  Wt 121 lb (54.9 kg)   LMP 10/23/2002   BMI 21.43 kg/m  Thin ww NAD Lungs cta Cor Nl abd NT Rectal exam deferred until colonoscopy  Data reviewed see HPI

## 2020-11-17 NOTE — Patient Instructions (Signed)
You have been scheduled for a colonoscopy. Please follow written instructions given to you at your visit today.  Please pick up your prep supplies at the pharmacy within the next 1-3 days. If you use inhalers (even only as needed), please bring them with you on the day of your procedure.   I appreciate the opportunity to care for you. Carl Gessner, MD, FACG 

## 2020-11-28 ENCOUNTER — Encounter: Payer: Self-pay | Admitting: Internal Medicine

## 2020-11-28 ENCOUNTER — Other Ambulatory Visit: Payer: Self-pay

## 2020-11-28 ENCOUNTER — Ambulatory Visit (AMBULATORY_SURGERY_CENTER): Payer: Medicare HMO | Admitting: Internal Medicine

## 2020-11-28 VITALS — BP 98/63 | HR 68 | Temp 97.3°F | Resp 21 | Ht 63.0 in | Wt 121.0 lb

## 2020-11-28 DIAGNOSIS — K648 Other hemorrhoids: Secondary | ICD-10-CM

## 2020-11-28 DIAGNOSIS — K635 Polyp of colon: Secondary | ICD-10-CM

## 2020-11-28 DIAGNOSIS — R194 Change in bowel habit: Secondary | ICD-10-CM

## 2020-11-28 DIAGNOSIS — K5730 Diverticulosis of large intestine without perforation or abscess without bleeding: Secondary | ICD-10-CM

## 2020-11-28 DIAGNOSIS — D125 Benign neoplasm of sigmoid colon: Secondary | ICD-10-CM

## 2020-11-28 MED ORDER — SODIUM CHLORIDE 0.9 % IV SOLN: 500.0000 mL | Freq: Once | INTRAVENOUS | Status: DC

## 2020-11-28 NOTE — Progress Notes (Signed)
Pt's states no medical or surgical changes since previsit or office visit. 

## 2020-11-28 NOTE — Progress Notes (Signed)
C.W. vital signs. 

## 2020-11-28 NOTE — Op Note (Signed)
[facility] Patient Name: Amanda Bennett Procedure Date: 11/28/2020 3:54 PM MRN: [id number] Endoscopist: Gatha Mayer , MD Age: 65 Referring MD:  Date of Birth: [birth year] Gender: Female Account #: [id number] Procedure:                Colonoscopy Indications:              Change in bowel habits Medicines:                Propofol per Anesthesia, Monitored Anesthesia Care Procedure:                Pre-Anesthesia Assessment:                           - Prior to the procedure, a History and Physical                            was performed, and patient medications and                            allergies were reviewed. The patient's tolerance of                            previous anesthesia was also reviewed. The risks                            and benefits of the procedure and the sedation                            options and risks were discussed with the patient.                            All questions were answered, and informed consent                            was obtained. Prior Anticoagulants: The patient has                            taken no previous anticoagulant or antiplatelet                            agents. ASA Grade Assessment: III - A patient with                            severe systemic disease. After reviewing the risks                            and benefits, the patient was deemed in                            satisfactory condition to undergo the procedure.                           After obtaining informed consent, the colonoscope  was passed under direct vision. Throughout the                            procedure, the patient's blood pressure, pulse, and                            oxygen saturations were monitored continuously. The                            Olympus CF-HQ190L ([id number]) Colonoscope was                            introduced through the anus and advanced to the the                            cecum,  identified by appendiceal orifice and                            ileocecal valve. The colonoscopy was performed                            without difficulty. The patient tolerated the                            procedure well. The quality of the bowel                            preparation was adequate. The ileocecal valve,                            appendiceal orifice, and rectum were photographed.                            The bowel preparation used was Miralax via split                            dose instruction. Scope In: 4:05:05 PM Scope Out: 4:21:08 PM Scope Withdrawal Time: 0 hours 12 minutes 35 seconds  Total Procedure Duration: 0 hours 16 minutes 3 seconds  Findings:                 Hemorrhoids were found on perianal exam.                           The digital rectal exam findings include decreased                            sphincter tone.                           A 5 mm polyp was found in the sigmoid colon. The                            polyp was sessile. The polyp was removed with a  cold snare. Resection and retrieval were complete.                            Verification of patient identification for the                            specimen was done. Estimated blood loss was minimal.                           Many small and large-mouthed diverticula were found                            in the sigmoid colon and descending colon. There                            was narrowing of the colon in association with the                            diverticular opening.                           The exam was otherwise without abnormality on                            direct and retroflexion views. Complications:            No immediate complications. Estimated Blood Loss:     Estimated blood loss was minimal. Impression:               - Hemorrhoids found on perianal exam.                           - Decreased sphincter tone found on digital rectal                             exam.                           - One 5 mm polyp in the sigmoid colon, removed with                            a cold snare. Resected and retrieved.                           - Severe diverticulosis in the sigmoid colon and in                            the descending colon. There was narrowing of the                            colon in association with the diverticular opening.                           - The examination was otherwise normal on direct  and retroflexion views. I suspect the colorectal                            surgeon saw mucous discharge at anoscopy Recommendation:           - Patient has a contact number available for                            emergencies. The signs and symptoms of potential                            delayed complications were discussed with the                            patient. Return to normal activities tomorrow.                            Written discharge instructions were provided to the                            patient.                           - Resume previous diet.                           - Continue present medications.                           - Repeat colonoscopy is recommended. The                            colonoscopy date will be determined after pathology                            results from today's exam become available for                            review.                           cc: Dr. [facility] - f/u AIN with                            him as planned Gatha Mayer, MD 11/28/2020 4:31:15 PM This report has been signed electronically.

## 2020-11-28 NOTE — Patient Instructions (Addendum)
I removed a tiny polyp, saw diverticulosis and some hemorrhoids were swollen. Nothing bad going on. Not sure what the discharge the other doctor saw but sometimes mucous can be seen and I bet that is what it was. At any rate nothing to worry about.  I will let you know pathology results and when to have another routine colonoscopy by mail and/or My Chart.  I appreciate the opportunity to care for you. Gatha Mayer, MD, FACG  YOU HAD AN ENDOSCOPIC PROCEDURE TODAY AT [facility]:   Refer to the procedure report that was given to you for any specific questions about what was found during the examination.  If the procedure report does not answer your questions, please call your gastroenterologist to clarify.  If you requested that your care partner not be given the details of your procedure findings, then the procedure report has been included in a sealed envelope for you to review at your convenience later.  YOU SHOULD EXPECT: Some feelings of bloating in the abdomen. Passage of more gas than usual.  Walking can help get rid of the air that was put into your GI tract during the procedure and reduce the bloating. If you had a lower endoscopy (such as a colonoscopy or flexible sigmoidoscopy) you may notice spotting of blood in your stool or on the toilet paper. If you underwent a bowel prep for your procedure, you may not have a normal bowel movement for a few days.  Please Note:  You might notice some irritation and congestion in your nose or some drainage.  This is from the oxygen used during your procedure.  There is no need for concern and it should clear up in a day or so.  SYMPTOMS TO REPORT IMMEDIATELY:   Following lower endoscopy (colonoscopy or flexible sigmoidoscopy):  Excessive amounts of blood in the stool  Significant tenderness or worsening of abdominal pains  Swelling of the abdomen that is new, acute  Fever of 100F or higher  For urgent or emergent issues, a  gastroenterologist can be reached at any hour by calling [phone]. Do not use MyChart messaging for urgent concerns.    DIET:  We do recommend a small meal at first, but then you may proceed to your regular diet.  Drink plenty of fluids but you should avoid alcoholic beverages for 24 hours. Try to increase the fiber in your diet, and drink plenty of water.  ACTIVITY:  You should plan to take it easy for the rest of today and you should NOT DRIVE or use heavy machinery until tomorrow (because of the sedation medicines used during the test).    FOLLOW UP: Our staff will call the number listed on your records 48-72 hours following your procedure to check on you and address any questions or concerns that you may have regarding the information given to you following your procedure. If we do not reach you, we will leave a message.  We will attempt to reach you two times.  During this call, we will ask if you have developed any symptoms of COVID 19. If you develop any symptoms (ie: fever, flu-like symptoms, shortness of breath, cough etc.) before then, please call [phone].  If you test positive for Covid 19 in the 2 weeks post procedure, please call and report this information to [place].    If any biopsies were taken you will be contacted by phone or by letter within the next 1-3 weeks.  Please call  us at [phone] if you have not heard about the biopsies in 3 weeks.    SIGNATURES/CONFIDENTIALITY: You and/or your care partner have signed paperwork which will be entered into your electronic medical record.  These signatures attest to the fact that that the information above on your After Visit Summary has been reviewed and is understood.  Full responsibility of the confidentiality of this discharge information lies with you and/or your care-partner.

## 2020-11-28 NOTE — Progress Notes (Signed)
PT taken to PACU. Monitors in place. VSS. Report given to RN. 

## 2020-11-28 NOTE — Progress Notes (Signed)
Called to room to assist during endoscopic procedure.  Patient ID and intended procedure confirmed with present staff. Received instructions for my participation in the procedure from the performing physician.  

## 2020-11-30 ENCOUNTER — Telehealth: Payer: Self-pay

## 2020-11-30 NOTE — Telephone Encounter (Signed)
LVM

## 2020-12-06 ENCOUNTER — Encounter: Payer: Self-pay | Admitting: Internal Medicine

## 2021-05-09 ENCOUNTER — Other Ambulatory Visit: Payer: Self-pay

## 2021-05-09 ENCOUNTER — Emergency Department
Admission: EM | Admit: 2021-05-09 | Discharge: 2021-05-09 | Disposition: A | Discharge status: Home / Self Care | Payer: Medicare HMO | Attending: Emergency Medicine | Admitting: Emergency Medicine

## 2021-05-09 ENCOUNTER — Encounter: Payer: Self-pay | Admitting: *Deleted

## 2021-05-09 DIAGNOSIS — F17210 Nicotine dependence, cigarettes, uncomplicated: Secondary | ICD-10-CM | POA: Insufficient documentation

## 2021-05-09 DIAGNOSIS — B958 Unspecified staphylococcus as the cause of diseases classified elsewhere: Secondary | ICD-10-CM | POA: Diagnosis not present

## 2021-05-09 DIAGNOSIS — L299 Pruritus, unspecified: Secondary | ICD-10-CM | POA: Insufficient documentation

## 2021-05-09 MED ORDER — CEPHALEXIN 500 MG PO CAPS: 500.0000 mg | ORAL_CAPSULE | Freq: Four times a day (QID) | ORAL | 0 refills | Status: AC

## 2021-05-09 MED ORDER — CEFTRIAXONE SODIUM 1 G IJ SOLR
1.0000 g | Freq: Once | INTRAMUSCULAR | Status: AC
  Administered 2021-05-09: 1 g via INTRAMUSCULAR
  Filled 2021-05-09: qty 10

## 2021-05-09 MED ORDER — DOXYCYCLINE MONOHYDRATE 100 MG PO TABS: 100.0000 mg | ORAL_TABLET | Freq: Two times a day (BID) | ORAL | 0 refills | Status: AC

## 2021-05-09 MED ORDER — LIDOCAINE HCL (PF) 1 % IJ SOLN
5.0000 mL | Freq: Once | INTRAMUSCULAR | Status: AC
  Administered 2021-05-09: 5 mL
  Filled 2021-05-09: qty 5

## 2021-05-09 NOTE — Discharge Instructions (Addendum)
You can take Doxycycline twice daily for seven days.  You can take Keflex four times daily for the next seven days.

## 2021-05-09 NOTE — ED Notes (Signed)
NP Amanda Bennett messaged: Patient has questions..."will the antibiotics clear up the bugs in my stool?"

## 2021-05-09 NOTE — ED Triage Notes (Signed)
Pt has insect bites all over body.  Pr reports she has sand flies at her house.  Pt alert  speech clear.

## 2021-05-09 NOTE — ED Notes (Signed)
See triage note  Presents with several insect bites  Was told that it was sand flies pt has several areas to arms ,neck and legs  Noticed this about 1 week ago  Describes area as very painful

## 2021-05-09 NOTE — ED Provider Notes (Signed)
[facility]EM[facility]ENCY DEPART[facility]  ____________________________________________  Time seen: Approximately 6:47 PM  I have reviewed the triage vital signs and the nursing notes.   HISTORY  Chief Complaint Insect Bite   Historian Patient     HPI Amanda Bennett is a 65 y.o. female presents to the emergency department with shallow ulcerations along the posterior neck, lower extremities and upper extremities with surrounding erythema.  Patient states that rash is pruritic and she has been scratching at the affected areas constantly.  Patient is concerned that she has been bitten/stung by sand flies.  She also states that she has noticed insects in her stool.  She denies chest pain, chest tightness or abdominal pain.   No past medical history on file.   Immunizations up to date:  Yes.     No past medical history on file.  There are no problems to display for this patient.     Prior to Admission medications   Medication Sig Start Date End Date Taking? Authorizing Provider  cephALEXin (KEFLEX) 500 MG capsule Take 1 capsule (500 mg total) by mouth 4 (four) times daily for 7 days. 05/09/21 05/16/21 Yes Vallarie Mare M, PA-C  doxycycline (ADOXA) 100 MG tablet Take 1 tablet (100 mg total) by mouth 2 (two) times daily for 7 days. 05/09/21 05/16/21 Yes Lannie Fields, PA-C    Allergies Patient has no known allergies.  No family history on file.  Social History Social History   Tobacco Use   Smoking status: Every Day    Types: Cigarettes   Smokeless tobacco: Never  Substance Use Topics   Alcohol use: Not Currently     Review of Systems  Constitutional: No fever/chills Eyes:  No discharge ENT: No upper respiratory complaints. Respiratory: no cough. No SOB/ use of accessory muscles to breath Gastrointestinal:   No nausea, no vomiting.  No diarrhea.  No constipation. Musculoskeletal: Negative for musculoskeletal pain. Skin: Patient has rash.      ____________________________________________   PHYSICAL EXAM:  VITAL SIGNS: ED Triage Vitals  Enc Vitals Group     BP 05/09/21 1746 (!) 127/91     Pulse Rate 05/09/21 1746 88     Resp 05/09/21 1746 20     Temp 05/09/21 1746 98.5 F (36.9 C)     Temp Source 05/09/21 1746 Oral     SpO2 05/09/21 1746 98 %     Weight 05/09/21 1747 109 lb (49.4 kg)     Height 05/09/21 1747 '5\' 4"'$  (1.626 m)     Head Circumference --      Peak Flow --      Pain Score 05/09/21 1746 10     Pain Loc --      Pain Edu? --      Excl. in [place]? --      Constitutional: Alert and oriented. Well appearing and in no acute distress. Eyes: Conjunctivae are normal. PERRL. EOMI. Head: Atraumatic. ENT:      Nose: No congestion/rhinnorhea.      Mouth/Throat: Mucous membranes are moist.  Neck: No stridor.  No cervical spine tenderness to palpation. Cardiovascular: Normal rate, regular rhythm. Normal S1 and S2.  Good peripheral circulation. Respiratory: Normal respiratory effort without tachypnea or retractions. Lungs CTAB. Good air entry to the bases with no decreased or absent breath sounds Gastrointestinal: Bowel sounds x 4 quadrants. Soft and nontender to palpation. No guarding or rigidity. No distention. Musculoskeletal: Full range of motion to all extremities. No obvious deformities noted Neurologic:  Normal for  age. No gross focal neurologic deficits are appreciated.  Skin: Patient had shallow ulcerations diffusely across body with surrounding erythema in various stages of healing. Psychiatric: Mood and affect are normal for age. Speech and behavior are normal.   ____________________________________________   LABS (all labs ordered are listed, but only abnormal results are displayed)  Labs Reviewed - No data to display ____________________________________________  EKG   ____________________________________________  RADIOLOGY   No results  found.  ____________________________________________    PROCEDURES  Procedure(s) performed:     Procedures     Medications  cefTRIAXone (ROCEPHIN) injection 1 g (has no administration in time range)  lidocaine (PF) (XYLOCAINE) 1 % injection 5 mL (has no administration in time range)     ____________________________________________   INITIAL IMPRESSION / ASSESSMENT AND PLAN / ED COURSE  Pertinent labs & imaging results that were available during my care of the patient were reviewed by me and considered in my medical decision making (see chart for details).      Assessment and plan Rash:  65 year old female presents to the emergency department with rash.  Vital signs are reassuring at triage.  On physical exam, patient had shallow ulcerations with surrounding erythema concerning for secondary staph infection.  Will treat with IM Rocephin and will cover patient for MRSA and MSSA infection with doxycycline and Keflex at home.  She was advised to follow-up with primary care as needed.  All patient questions were answered.     ____________________________________________  FINAL CLINICAL IMPRESSION(S) / ED DIAGNOSES  Final diagnoses:  Staph infection      NEW MEDICATIONS STARTED DURING THIS VISIT:  ED Discharge Orders          Ordered    doxycycline (ADOXA) 100 MG tablet  2 times daily        05/09/21 1837    cephALEXin (KEFLEX) 500 MG capsule  4 times daily        05/09/21 1837                This chart was dictated using voice recognition software/Dragon. Despite best efforts to proofread, errors can occur which can change the meaning. Any change was purely unintentional.     Lannie Fields, PA-C 05/09/21 Yevette Edwards    Vladimir Crofts, MD 05/12/21 1524

## 2021-05-10 ENCOUNTER — Encounter: Payer: Self-pay | Admitting: Internal Medicine

## 2021-06-22 ENCOUNTER — Telehealth: Payer: Self-pay | Admitting: Cardiovascular Disease

## 2021-06-22 NOTE — Telephone Encounter (Signed)
Pt c/o Shortness Of Breath: STAT if SOB developed within the last 24 hours or pt is noticeably SOB on the phone  1. Are you currently SOB (can you hear that pt is SOB on the phone)? yes  2. How long have you been experiencing SOB? Right now  3. Are you SOB when sitting or when up moving around? Both   4. Are you currently experiencing any other symptoms? Swelling in ankles, headaches, nausea

## 2021-06-22 NOTE — Telephone Encounter (Signed)
Spoke with pt "I really just called for an appointment. I take my blood pressure every day 150-160 sbp." Pt states she does take her blood pressure medication as prescribed. She was added to the schedule to see C. Walker on the 10/26.

## 2021-07-05 ENCOUNTER — Encounter (HOSPITAL_BASED_OUTPATIENT_CLINIC_OR_DEPARTMENT_OTHER): Payer: Self-pay | Admitting: Family

## 2021-07-05 ENCOUNTER — Ambulatory Visit (INDEPENDENT_AMBULATORY_CARE_PROVIDER_SITE_OTHER): Payer: Medicare HMO | Admitting: Family

## 2021-07-05 ENCOUNTER — Other Ambulatory Visit: Payer: Self-pay

## 2021-07-05 VITALS — BP 112/80 | HR 63 | Ht 64.0 in | Wt 123.9 lb

## 2021-07-05 DIAGNOSIS — R600 Localized edema: Secondary | ICD-10-CM | POA: Diagnosis not present

## 2021-07-05 DIAGNOSIS — I2510 Atherosclerotic heart disease of native coronary artery without angina pectoris: Secondary | ICD-10-CM | POA: Diagnosis not present

## 2021-07-05 DIAGNOSIS — R072 Precordial pain: Secondary | ICD-10-CM

## 2021-07-05 DIAGNOSIS — R0609 Other forms of dyspnea: Secondary | ICD-10-CM | POA: Diagnosis not present

## 2021-07-05 DIAGNOSIS — E782 Mixed hyperlipidemia: Secondary | ICD-10-CM

## 2021-07-05 DIAGNOSIS — R079 Chest pain, unspecified: Secondary | ICD-10-CM

## 2021-07-05 MED ORDER — METOPROLOL TARTRATE 50 MG PO TABS
ORAL_TABLET | ORAL | 3 refills | Status: DC
Start: 2021-07-05 — End: 2021-07-05

## 2021-07-05 MED ORDER — METOPROLOL TARTRATE 50 MG PO TABS
ORAL_TABLET | ORAL | 0 refills | Status: DC
Start: 2021-07-05 — End: 2021-11-20

## 2021-07-05 NOTE — Patient Instructions (Addendum)
Medication Instructions:  Your Physician recommend you continue on your current medication as directed.    *If you need a refill on your cardiac medications before your next appointment, please call your pharmacy*   Lab Work: Your physician recommends that you return for lab work today for a BMP  If you have labs (blood work) drawn today and your tests are completely normal, you will receive your results only by: MyChart Message (if you have MyChart) OR A paper copy in the mail If you have any lab test that is abnormal or we need to change your treatment, we will call you to review the results.   Testing/Procedures: Your physician has requested that you have an echocardiogram. Echocardiography is a painless test that uses sound waves to create images of your heart. It provides your doctor with information about the size and shape of your heart and how well your heart's chambers and valves are working. This procedure takes approximately one hour. There are no restrictions for this procedure. [place], you and your health needs are our priority.  As part of our continuing mission to provide you with exceptional heart care, we have created designated Provider Care Teams.  These Care Teams include your primary Cardiologist (physician) and Advanced Practice Providers (APPs -  Physician Assistants and Nurse Practitioners) who all work together to provide you with the care you need, when you need it.  We recommend signing up for the patient portal called "MyChart".  Sign up information is provided on this After Visit Summary.  MyChart is used to connect with patients for Virtual Visits (Telemedicine).  Patients are able to view lab/test results, encounter notes, upcoming appointments, etc.  Non-urgent messages can be sent to your provider as well.   To learn more about what you can do with MyChart, go to [email or web address].    Your  next appointment:   6 week(s)  The format for your next appointment:   In Person  Provider:   You may see Kathlyn Sacramento, MD or one of the following Advanced Practice Providers on your designated Care Team:   Sande Rives, PA-C Coletta Memos, FNP   Other Instructions   Your cardiac CT will be scheduled at one of the below locations:   [facility] [address] [place], [place] [address] [phone]   If scheduled at [facility], please arrive at the [facility] main entrance (entrance A) of [facility] 30 minutes prior to test start time. You can use the FREE valet parking offered at the main entrance (encouraged to control the heart rate for the test) Proceed to the [facility] Radiology Department (first floor) to check-in and test prep.   Please follow these instructions carefully (unless otherwise directed):  On the Night Before the Test: Be sure to Drink plenty of water. Do not consume any caffeinated/decaffeinated beverages or chocolate 12 hours prior to your test. Do not take any antihistamines 12 hours prior to your test.   On the Day of the Test: Drink plenty of water until 1 hour prior to the test. Do not eat any food 4 hours prior to the test. You may take your regular medications prior to the test.  Take metoprolol (Lopressor) two hours prior to test. FEMALES- please wear underwire-free bra if available, avoid dresses & tight clothing       After the Test: Drink plenty of water. After receiving  IV contrast, you may experience a mild flushed feeling. This is normal. On occasion, you may experience a mild rash up to 24 hours after the test. This is not dangerous. If this occurs, you can take Benadryl 25 mg and increase your fluid intake. If you experience trouble breathing, this can be serious. If it is severe call 911 IMMEDIATELY. If it is mild, please call our office. If you take any of these medications:  Glipizide/Metformin, Avandament, Glucavance, please do not take 48 hours after completing test unless otherwise instructed.  Please allow 2-4 weeks for scheduling of routine cardiac CTs. Some insurance companies require a pre-authorization which may delay scheduling of this test.   For non-scheduling related questions, please contact the cardiac imaging nurse navigator should you have any questions/concerns: Marchia Bond, Cardiac Imaging Nurse Navigator Gordy Clement, Cardiac Imaging Nurse Navigator [facility] Direct Office Dial: [phone]   For scheduling needs, including cancellations and rescheduling, please call [place], [phone].    Please bring your blood pressure cuff to your next office visit.   To prevent or reduce lower extremity swelling: Eat a low salt diet. Salt makes the body hold onto extra fluid which causes swelling. Sit with legs elevated. For example, in the recliner or on an [place].  Wear knee-high compression stockings during the daytime. Ones labeled 15-20 mmHg provide good compression.

## 2021-07-05 NOTE — Progress Notes (Signed)
Office Visit    Patient Name: Amanda Bennett Date of Encounter: 07/05/2021  PCP:  Alvester Chou, NP   [facility]  Cardiologist:  Kathlyn Sacramento, MD  Advanced Practice Provider:  No care team member to display Electrophysiologist:  None      Chief Complaint    Amanda Bennett is a 65 y.o. 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   Anxiety    Bruises easily    Cervical spinal stenosis 2008   Chronic constipation    Chronic pain syndrome    neck and lower back   COPD (chronic obstructive pulmonary disease) ([place])    Depression    Family history of adverse reaction to anesthesia    mother--  ponv   GERD (gastroesophageal reflux disease)    History of basal cell carcinoma excision    History of prolonged Q-T interval on ECG    per cardiologist, dr Fletcher Anon, note in epic-- in the setting of treatment with medications that can cause prolonged QT, went back to normal after stopping the medications (was seen by dr Caryl Comes no further workup recommended)   History of staph infection    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   Hypertension    Insomnia    Lesion of ulnar nerve    Mild carotid artery disease ([place])    per duplex 08-23-2017  bilateral ICA 1-39%   OA (osteoarthritis)    neck and back   Occlusion of left vertebral artery    noted 05-28-2017 MRI;  followed by cardiologist, dr Fletcher Anon (per cardiologist has collaterals)   Osteoporosis    Perirectal abscess 07/08/2018   PONV (postoperative nausea and vomiting)    sometimes queezy   Primary localized osteoarthrosis, pelvic region and thigh    Wears dentures    upper   Wears glasses    Past  Surgical History:  Procedure Laterality Date   ABDOMINAL HYSTERECTOMY  age 41s   CARPAL TUNNEL RELEASE Bilateral 2018   CHOLECYSTECTOMY     CHOLECYSTECTOMY  10/30/2011   Procedure: LAPAROSCOPIC CHOLECYSTECTOMY WITH INTRAOPERATIVE CHOLANGIOGRAM;  Surgeon: Imogene Burn. Georgette Dover, MD;  Location: [facility];  Service: General;  Laterality: N/A;   COLONOSCOPY     DORSAL COMPARTMENT RELEASE Left 07/20/2015   Procedure: RELEASE DORSAL COMPARTMENT (DEQUERVAIN);  Surgeon: Corky Mull, MD;  Location: [facility];  Service: Orthopedics;  Laterality: Left;   EVALUATION UNDER ANESTHESIA WITH FISTULECTOMY N/A 10/02/2018   Procedure: ANAL EXAM UNDER ANESTHESIA;  Surgeon: Leighton Ruff, MD;  Location[facility];  Service: General;  Laterality: N/A;   HEMORRHOID SURGERY  11/25/2006   dr Bubba Camp  @[facility]    I & D, ORIF RIGHT RING FINGER  Oct 9th, 15th ,2009   near amputation from dog bite   [place] N/A 07/08/2018   Procedure: [place];  Surgeon: Benjamine Sprague, DO;  Location: [facility];  Service: General;  Laterality: N/A;   INGUINAL LYMPH NODE BIOPSY Right 02/03/2014   Procedure: INGUINAL LYMPH NODE BIOPSY;  Surgeon: Zenovia Jarred, MD;  Location: [facility];  Service: General;  Laterality: Right;  right  inguinal area   LAPAROSCOPIC SALPINGOOPHERECTOMY Bilateral age 75s   LIGATION OF INTERNAL FISTULA TRACT N/A 04/01/2019   Procedure: LIGATION OF INTERNAL FISTULA TRACT RESECTION OF ANAL LESION;  Surgeon: Leighton Ruff, MD;  Location: [facility];  Service: General;  Laterality: N/A;   ORIF CLAVICLE FRACTURE Right 2003   HARDWARE REMOVAL 08-22-2004 BY DR Veverly Fells @[facility]    PLACEMENT OF SETON N/A 10/02/2018   Procedure: PLACEMENT OF SETON VS FISTULOTOMY, BIOPSY;  Surgeon: Leighton Ruff, MD;  Location: [facility];  Service: General;  Laterality: N/A;   SHOULDER ARTHROSCOPY Left 07/20/2015    Procedure: ARTHROSCOPY SHOULDER DEBRIDEMENT AND DECOMPRESSION;  Surgeon: Corky Mull, MD;  Location: [facility];  Service: Orthopedics;  Laterality: Left;   SHOULDER ARTHROSCOPY Left 01/12/2016   Procedure: ARTHROSCOPY SHOULDER WITH DEBRIDEMENT AND EXCISION OF THE DISTAL CLAVICLE;  Surgeon: Corky Mull, MD;  Location: [facility];  Service: Orthopedics;  Laterality: Left;    Allergies  Allergies  Allergen Reactions   Fluoxetine Hcl Other (See Comments)    Suicidal    Lisinopril Cough   Tramadol     Nervous, anxiety   Statins     Severe myalgia with Atorvastatin and Rosuvastatin   Hydrocodone Nausea Only and Rash    Shaking, nervous    History of Present Illness    Amanda Bennett is a 65 y.o. female with a hx of hypertension, hyperlipidemia, previously prolonged QT interval, atrial septal aneurysm, prior tobacco use, anxiety, hepatitis C.  Recently followed by Dr. Fletcher Anon and last seen 08/06/2017  She has a family history of prolonged QT syndrome and did have previously mildly prolonged QT on EKG in the setting of medications that can cause prolonged QT.  This medication for discontinued and her QT returned to normal.  Previous cardiac work-up includes nuclear stress test in 2015 which was normal.  Previous CT scan did show evidence of coronary atherosclerosis.  Echocardiogram May 2018 with normal LVEF and atrial septal aneurysm.  Prior MRI of cervical spine showing occluded left vertebral artery.  Previous intolerance to atorvastatin and rosuvastatin with myalgia.  She "did not feel good" taking Zetia either with leg cramps.  Presents today for follow up with a myriad of concerns. She has LE edema in her feet and ankles. It is worse at the end of the day or after walking long periods. Improves with elevation. She does not wear compression socks. Endorses dyspnea on exertion over the last 4 months. She is not coughing nor wheezing. Her primary care has refilled her albuterol with  some improvement and makes her heart race. She endorses intermittent palpitations. Will wake up with palpitations. Endorses orthopnea, PND. She endorses lightheadedness which improves with breathing exercises and sitting. Tells me she can't take aspirin due to bleeding when she bumps her arm. She notes left sided chest discomfort which radiates to her arm and is occurring more often with exertion. Does resolve with rest. Occurring once per week.   Does note she is planning to move to [place] but will be back and forth from [place] to [place] to care for her 65 year old mother.   EKGs/Labs/Other Studies Reviewed:   The following studies were reviewed today:  Carotid duplex 08/2018 Right Carotid: There is evidence in the right ICA of a 1-39% stenosis. The                 extracranial vessels were near-normal with only minimal  wall  thickening or plaque.   Left Carotid: There is evidence in the left ICA of a 1-39% stenosis.   Vertebrals:  Right vertebral artery was patent with antegrade flow. No  evidence              of flow detected in the left vertebral artery. Proximal  occlusion              in the left vertebral artery with reconstitution of antegrade  flow              noted in the mid and distal segments.  Subclavians: Normal flow hemodynamics were seen in bilateral subclavian               arteries.   Echo 01/2017 Left ventricle: The cavity size was normal. Wall thickness was    normal. Systolic function was normal. The estimated ejection    fraction was in the range of 60% to 65%. Wall motion was normal;    there were no regional wall motion abnormalities. Doppler    parameters are consistent with abnormal left ventricular    relaxation (grade 1 diastolic dysfunction).  - Mitral valve: Calcified annulus.  - Atrial septum: There was an atrial septal aneurysm.   Leane Call 09/2013 Exercise Capacity:  Lexiscan with no exercise. BP Response:  Normal blood  pressure response. Clinical Symptoms:  No significant symptoms noted. ECG Impression:  No significant ST segment change suggestive of ischemia. Comparison with Prior Nuclear Study: No images to compare   Overall Impression:  Normal stress nuclear study.   LV Wall Motion:  NL LV Function, EF 69%; NL Wall Motion  EKG:  EKG is  ordered today.  The ekg ordered today demonstrates NSR 63 bpm with no acute ST/T wave changes.   Recent Labs: No results found for requested labs within last 8760 hours.  Recent Lipid Panel    Component Value Date/Time   CHOL 281 (H) 11/20/2016 1148   TRIG 234 (H) 11/20/2016 1148   HDL 60 11/20/2016 1148   CHOLHDL 4.7 11/20/2016 1148   VLDL 47 (H) 11/20/2016 1148   LDLCALC 174 (H) 11/20/2016 1148   LDLDIRECT 136 (H) 04/08/2012 1125    Home Medications   Current Meds  Medication Sig   clonazePAM (KLONOPIN) 0.5 MG tablet Take 1 mg by mouth at bedtime.   diclofenac Sodium (VOLTAREN) 1 % GEL Apply 4 g topically 2 (two) times daily.    ibuprofen (ADVIL,MOTRIN) 200 MG tablet Take 200 mg by mouth every 6 (six) hours as needed.   Lactase (LACTOSE INTOLERANCE PO) Take by mouth. One teaspoon three times daily before meals   metoprolol tartrate (LOPRESSOR) 50 MG tablet Take 1 tablet 2hrs prior to cardiac CTA   mirtazapine (REMERON) 30 MG tablet Take 30 mg by mouth at bedtime.   omeprazole (PRILOSEC) 20 MG capsule Take 20 mg by mouth every morning.    pregabalin (LYRICA) 150 MG capsule Take 150 mg by mouth as needed. May take 1-2 tablets at night   valsartan (DIOVAN) 160 MG tablet Take 160 mg by mouth daily.    Review of Systems      All other systems reviewed and are otherwise negative except as noted above.  Physical Exam    VS:  BP 112/80   Pulse 63   Ht 5' 4"  (1.626 m)   Wt 123 lb 14.4 oz (56.2 kg)   LMP 10/23/2002   SpO2 97%   BMI 21.27 kg/m  , BMI  Body mass index is 21.27 kg/m.  Wt Readings from Last 3 Encounters:  07/05/21 123 lb 14.4 oz (56.2  kg)  05/09/21 109 lb (49.4 kg)  11/28/20 121 lb (54.9 kg)    GEN: Well nourished, well developed, in no acute distress. HEENT: normal. Neck: Supple, no JVD, carotid bruits, or masses. Cardiac: RRR, no murmurs, rubs, or gallops. No clubbing, cyanosis, edema.  Radials/PT 2+ and equal bilaterally.  Respiratory:  Respirations regular and unlabored, clear to auscultation bilaterally. GI: Soft, nontender, nondistended. MS: No deformity or atrophy. Skin: Warm and dry, no rash. Neuro:  Strength and sensation are intact. Psych: Normal affect.  Assessment & Plan    Chest pain in adult / Dyspnea on exertion - Endorses exertional dyspnea and left sided chest discomfort over the last 4 months which is progressing. EKG today NSR with no acute ST/T wave changes. Echocardiogram and cardiac CTA ordered today for evaluation. BMP today to assess renal function prior to CT. Metoprolol tartrate 54m two hours prior to cardiac CT. Consider etiology CAD, HF, valvular abnormality, deconditioning.   LE edema - Likely etiology venous insufficiency as worse at end of day  Palpitations - EKG today no arrhythmia.Echo ordered as above. Encouraged to stay well hydrated, manage stress well. Consider ZIO at follow up if palpitations still bothersome.   Atrial septal aneurysm - Noted on echo 01/2017. Repeat echo ordered, as above. Recommended for daily indefinite aspirin but reports she "bleeds easy" and does not take. Discuss again at follow up.   Vertebral artery occlusion /carotid artery disease -carotid duplex 08/2017 bilateral 1 to 39% stenosis. Lipid management as below. Denies syncope, amaurosis fugax. Repeat duplex as clinically indicated.   HTN - BP well controlled. Continue current antihypertensive regimen.  Encouraged to bring BP cuff to next clinic visit to assess accuracy.  HLD - Intolerant to Rosuvastatin, Atorvastatin, and Zetia per her report and previous documentation. Not interested in further therapy at  this point. Consider lipid clinic referral pending result of cardiac CTA.   Disposition: Follow up in 6 week(s) with Dr. AFletcher Anonor APP.  Signed, CLoel Dubonnet NP 07/05/2021, 2:49 PM [place]

## 2021-07-06 LAB — BASIC METABOLIC PANEL
BUN/Creatinine Ratio: 27 (ref 12–28)
BUN: 20 mg/dL (ref 8–27)
CO2: 23 mmol/L (ref 20–29)
Calcium: 9.9 mg/dL (ref 8.7–10.3)
Chloride: 104 mmol/L (ref 96–106)
Creatinine, Ser: 0.73 mg/dL (ref 0.57–1.00)
Glucose: 76 mg/dL (ref 70–99)
Potassium: 5.3 mmol/L — ABNORMAL HIGH (ref 3.5–5.2)
Sodium: 140 mmol/L (ref 134–144)
eGFR: 92 mL/min/{1.73_m2} (ref >59)

## 2021-07-11 ENCOUNTER — Ambulatory Visit (INDEPENDENT_AMBULATORY_CARE_PROVIDER_SITE_OTHER): Payer: Medicare HMO

## 2021-07-11 ENCOUNTER — Other Ambulatory Visit: Payer: Self-pay

## 2021-07-11 DIAGNOSIS — I2510 Atherosclerotic heart disease of native coronary artery without angina pectoris: Secondary | ICD-10-CM

## 2021-07-11 DIAGNOSIS — R079 Chest pain, unspecified: Secondary | ICD-10-CM

## 2021-07-11 DIAGNOSIS — R0609 Other forms of dyspnea: Secondary | ICD-10-CM

## 2021-07-11 LAB — ECHOCARDIOGRAM COMPLETE
Area-P 1/2: 2.68 cm2
S' Lateral: 2.35 cm

## 2021-07-14 ENCOUNTER — Other Ambulatory Visit: Payer: Self-pay | Admitting: *Deleted

## 2021-07-14 DIAGNOSIS — E875 Hyperkalemia: Secondary | ICD-10-CM

## 2021-07-17 ENCOUNTER — Telehealth (HOSPITAL_COMMUNITY): Payer: Self-pay | Admitting: *Deleted

## 2021-07-17 NOTE — Telephone Encounter (Signed)
Reaching out to patient to offer assistance regarding upcoming cardiac imaging study; pt verbalizes understanding of appt date/time, parking situation and where to check in, pre-test NPO status and medications ordered, and verified current allergies; name and call back number provided for further questions should they arise  Gordy Clement RN Navigator Cardiac Imaging [facility] [phone] office [phone] cell  Patient to take 50mg  metoprolol tartrate two hours prior to cardiac CT scan. She is aware she is to arrive at 2:45pm for her 3:15pm scan.

## 2021-07-18 ENCOUNTER — Other Ambulatory Visit: Payer: Self-pay

## 2021-07-18 ENCOUNTER — Other Ambulatory Visit: Payer: Self-pay | Admitting: Family

## 2021-07-18 ENCOUNTER — Ambulatory Visit (HOSPITAL_COMMUNITY)
Admission: RE | Admit: 2021-07-18 | Discharge: 2021-07-18 | Disposition: A | Discharge status: Home / Self Care | Payer: Medicare HMO | Source: Ambulatory Visit | Attending: Family | Admitting: Family

## 2021-07-18 DIAGNOSIS — R072 Precordial pain: Secondary | ICD-10-CM

## 2021-07-18 LAB — BASIC METABOLIC PANEL
BUN/Creatinine Ratio: 17 (ref 12–28)
BUN: 13 mg/dL (ref 8–27)
CO2: 25 mmol/L (ref 20–29)
Calcium: 9.7 mg/dL (ref 8.7–10.3)
Chloride: 102 mmol/L (ref 96–106)
Creatinine, Ser: 0.75 mg/dL (ref 0.57–1.00)
Glucose: 89 mg/dL (ref 70–99)
Potassium: 4.4 mmol/L (ref 3.5–5.2)
Sodium: 138 mmol/L (ref 134–144)
eGFR: 89 mL/min/{1.73_m2} (ref >59)

## 2021-07-18 MED ORDER — NITROGLYCERIN 0.4 MG SL SUBL
0.8000 mg | SUBLINGUAL_TABLET | Freq: Once | SUBLINGUAL | Status: AC
  Administered 2021-07-18: 0.8 mg via SUBLINGUAL

## 2021-07-18 MED ORDER — IOHEXOL 350 MG/ML SOLN
100.0000 mL | Freq: Once | INTRAVENOUS | Status: AC | PRN
  Administered 2021-07-18: 100 mL via INTRAVENOUS

## 2021-07-18 MED ORDER — NITROGLYCERIN 0.4 MG SL SUBL
SUBLINGUAL_TABLET | SUBLINGUAL | Status: AC
  Filled 2021-07-18: qty 2

## 2021-07-19 ENCOUNTER — Telehealth (HOSPITAL_BASED_OUTPATIENT_CLINIC_OR_DEPARTMENT_OTHER): Payer: Self-pay

## 2021-07-19 MED ORDER — VALSARTAN 80 MG PO TABS: 80.0000 mg | ORAL_TABLET | Freq: Every day | ORAL | 3 refills | Status: DC

## 2021-07-19 NOTE — Telephone Encounter (Signed)
Pt updated with lab results and questioning if she should just restart valsartan at half dose. She report the 3 days she did not take medication her BP was WNL and averaging around  118/60.   Loel Dubonnet, NP  07/19/2021  8:03 AM EST Back to Top    Potassium now normalized. Good result. Continue current medications.    Will forward to NP for recommendations.

## 2021-07-19 NOTE — Telephone Encounter (Signed)
Pt updated with NP's recommendations and verbalized understanding. New orders placed.

## 2021-07-19 NOTE — Telephone Encounter (Signed)
Valsartan offers blood pressure control as well as protection of kidney function. Okay to resume Valsartan at half tablet once daily. She should report blood pressure consistently more than 130 or less than 110.   Loel Dubonnet, NP

## 2021-07-19 NOTE — Telephone Encounter (Signed)
See previous encounter

## 2021-08-15 ENCOUNTER — Ambulatory Visit: Payer: Medicare HMO | Admitting: Cardiovascular Disease

## 2021-09-29 ENCOUNTER — Emergency Department (HOSPITAL_BASED_OUTPATIENT_CLINIC_OR_DEPARTMENT_OTHER)
Admission: EM | Admit: 2021-09-29 | Discharge: 2021-09-29 | Disposition: A | Discharge status: Home / Self Care | Payer: Medicare HMO | Attending: Emergency Medicine | Admitting: Emergency Medicine

## 2021-09-29 ENCOUNTER — Encounter (HOSPITAL_BASED_OUTPATIENT_CLINIC_OR_DEPARTMENT_OTHER): Payer: Self-pay | Admitting: *Deleted

## 2021-09-29 ENCOUNTER — Emergency Department (HOSPITAL_BASED_OUTPATIENT_CLINIC_OR_DEPARTMENT_OTHER): Payer: Medicare HMO

## 2021-09-29 ENCOUNTER — Other Ambulatory Visit: Payer: Self-pay

## 2021-09-29 DIAGNOSIS — R55 Syncope and collapse: Secondary | ICD-10-CM | POA: Diagnosis not present

## 2021-09-29 DIAGNOSIS — I10 Essential (primary) hypertension: Secondary | ICD-10-CM | POA: Insufficient documentation

## 2021-09-29 DIAGNOSIS — R0689 Other abnormalities of breathing: Secondary | ICD-10-CM | POA: Diagnosis not present

## 2021-09-29 DIAGNOSIS — S0083XA Contusion of other part of head, initial encounter: Secondary | ICD-10-CM | POA: Insufficient documentation

## 2021-09-29 DIAGNOSIS — Z7982 Long term (current) use of aspirin: Secondary | ICD-10-CM | POA: Diagnosis not present

## 2021-09-29 DIAGNOSIS — W07XXXA Fall from chair, initial encounter: Secondary | ICD-10-CM | POA: Diagnosis not present

## 2021-09-29 DIAGNOSIS — Z79899 Other long term (current) drug therapy: Secondary | ICD-10-CM | POA: Diagnosis not present

## 2021-09-29 DIAGNOSIS — M25562 Pain in left knee: Secondary | ICD-10-CM | POA: Diagnosis not present

## 2021-09-29 DIAGNOSIS — J449 Chronic obstructive pulmonary disease, unspecified: Secondary | ICD-10-CM | POA: Diagnosis not present

## 2021-09-29 DIAGNOSIS — S0990XA Unspecified injury of head, initial encounter: Secondary | ICD-10-CM

## 2021-09-29 LAB — BASIC METABOLIC PANEL
Anion gap: 8 (ref 5–15)
BUN: 10 mg/dL (ref 8–23)
CO2: 26 mmol/L (ref 22–32)
Calcium: 8.9 mg/dL (ref 8.9–10.3)
Chloride: 108 mmol/L (ref 98–111)
Creatinine, Ser: 0.55 mg/dL (ref 0.44–1.00)
GFR, Estimated: >60 mL/min (ref >60)
Glucose, Bld: 95 mg/dL (ref 70–99)
Potassium: 3.9 mmol/L (ref 3.5–5.1)
Sodium: 142 mmol/L (ref 135–145)

## 2021-09-29 LAB — HEPATIC FUNCTION PANEL
ALT: 13 U/L (ref 0–44)
AST: 22 U/L (ref 15–41)
Albumin: 3.9 g/dL (ref 3.5–5.0)
Alkaline Phosphatase: 93 U/L (ref 38–126)
Bilirubin, Direct: 0.1 mg/dL (ref 0.0–0.2)
Indirect Bilirubin: 0.3 mg/dL (ref 0.3–0.9)
Total Bilirubin: 0.4 mg/dL (ref 0.3–1.2)
Total Protein: 6.5 g/dL (ref 6.5–8.1)

## 2021-09-29 LAB — CBC
HCT: 36.8 % (ref 36.0–46.0)
Hemoglobin: 12.2 g/dL (ref 12.0–15.0)
MCH: 31.1 pg (ref 26.0–34.0)
MCHC: 33.2 g/dL (ref 30.0–36.0)
MCV: 93.9 fL (ref 80.0–100.0)
Platelets: 191 10*3/uL (ref 150–400)
RBC: 3.92 MIL/uL (ref 3.87–5.11)
RDW: 13.9 % (ref 11.5–15.5)
WBC: 7 10*3/uL (ref 4.0–10.5)
nRBC: 0 % (ref 0.0–0.2)

## 2021-09-29 LAB — URINALYSIS, ROUTINE W REFLEX MICROSCOPIC
Bilirubin Urine: NEGATIVE
Glucose, UA: NEGATIVE mg/dL
Hgb urine dipstick: NEGATIVE
Ketones, ur: NEGATIVE mg/dL
Leukocytes,Ua: NEGATIVE
Nitrite: NEGATIVE
Protein, ur: NEGATIVE mg/dL
Specific Gravity, Urine: 1.014 (ref 1.005–1.030)
pH: 6 (ref 5.0–8.0)

## 2021-09-29 LAB — TROPONIN I (HIGH SENSITIVITY)
Troponin I (High Sensitivity): <2 ng/L (ref <18)
Troponin I (High Sensitivity): 2 ng/L (ref <18)

## 2021-09-29 LAB — D-DIMER, QUANTITATIVE: D-Dimer, Quant: 3.3 ug/mL-FEU — ABNORMAL HIGH (ref 0.00–0.50)

## 2021-09-29 LAB — CBG MONITORING, ED: Glucose-Capillary: 73 mg/dL (ref 70–99)

## 2021-09-29 MED ORDER — KETOROLAC TROMETHAMINE 15 MG/ML IJ SOLN
15.0000 mg | Freq: Once | INTRAMUSCULAR | Status: AC
  Administered 2021-09-29: 15 mg via INTRAVENOUS
  Filled 2021-09-29: qty 1

## 2021-09-29 MED ORDER — ACETAMINOPHEN 325 MG PO TABS
650.0000 mg | ORAL_TABLET | Freq: Once | ORAL | Status: AC
  Administered 2021-09-29: 650 mg via ORAL
  Filled 2021-09-29: qty 2

## 2021-09-29 MED ORDER — SODIUM CHLORIDE 0.9 % IV BOLUS
1000.0000 mL | Freq: Once | INTRAVENOUS | Status: AC
  Administered 2021-09-29: 1000 mL via INTRAVENOUS

## 2021-09-29 MED ORDER — IOHEXOL 350 MG/ML SOLN
100.0000 mL | Freq: Once | INTRAVENOUS | Status: AC | PRN
  Administered 2021-09-29: 100 mL via INTRAVENOUS

## 2021-09-29 NOTE — ED Triage Notes (Signed)
Applied cervical collar to pt and spoke with EDP who ordered a CT head and neck

## 2021-09-29 NOTE — ED Notes (Signed)
Pt reports prior syncope and needing narcan. Pt denies any use of drugs or narcotics

## 2021-09-29 NOTE — ED Triage Notes (Signed)
Pt states that she was eating breakfast at 09:30 when she noted that she was having a hard time swallowing.  Next she felt "wobbly"  and generally weak and then she lost consciousness and fell out of her chair and struck her forehead, she has swelling.  No focal weakness or facial droop noted, no slurred speech.

## 2021-09-29 NOTE — ED Provider Notes (Signed)
[facility] Provider Note   CSN: [id number] Arrival date & time: 09/29/21  1039     History  Chief Complaint  Patient presents with   Loss of Consciousness    Amanda Bennett is a 66 y.o. female.  This is a 66 y.o. female with significant medical history as below, including COPD, hypertension who presents to the ED with complaint of syncope.  Patient reports that she experienced a brief episode of loss of consciousness earlier today reports that she was eating her breakfast, she began to feel weak, lightheaded, she fell from her chair to the floor and hit her forehead.  Brief LOC.  No seizure-like activity was observed by family in the room.  No chest pain, dyspnea, palpitations prior to symptom onset.  No focal weakness.  No vision or hearing changes.  No incontinence.  No nausea or vomiting following the episode.  When she awoke she was not confused or postictal.  Patient reports this is occurred in the past. Does have a mild headache and knee pain but otherwise feels back to her baseline.  No numbness, tingling, weakness, difficulty speaking or swallowing.  No chest pain or dyspnea.  No abdominal pain, nausea or vomiting.  She has been ambulatory since the event.   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 No date: Anxiety No date: Bruises easily 2008: Cervical spinal stenosis No date: Chronic constipation No date: Chronic pain syndrome     Comment:  neck and lower back No date: COPD (chronic obstructive pulmonary disease) (HCC) No date: Depression No date: Family history of adverse reaction to anesthesia     Comment:  mother--  ponv No date: GERD (gastroesophageal reflux disease) No date: History of basal cell carcinoma excision No date: History of prolonged Q-T interval on ECG     Comment:  per cardiologist, dr Fletcher Anon, note in epic-- in the               setting of treatment with  medications that can cause               prolonged QT, went back to normal after stopping the               medications (was seen by dr Caryl Comes no further workup               recommended) No date: History of staph infection     Comment:  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 No date: Hypertension No date: Insomnia No date: Lesion of ulnar nerve No date: Mild carotid artery disease ([place])     Comment:  per duplex 08-23-2017  bilateral ICA 1-39% No date: OA (osteoarthritis)     Comment:  neck and back No date: Occlusion of left vertebral artery     Comment:  noted 05-28-2017 MRI;  followed by cardiologist, dr               Fletcher Anon (per cardiologist has collaterals) No date: Osteoporosis 07/08/2018: Perirectal abscess No date: PONV (postoperative nausea and vomiting)     Comment:  sometimes queezy No date: Primary localized osteoarthrosis, pelvic region and thigh No date: Wears dentures     Comment:  upper No date: Wears glasses     The history is provided by the patient and a relative. No language interpreter was used.  Loss of Consciousness Associated symptoms: headaches   Associated symptoms: no chest pain, no  confusion, no fever, no nausea, no palpitations, no shortness of breath and no vomiting       Home Medications Prior to Admission medications   Medication Sig Start Date End Date Taking? Authorizing Provider  aspirin EC 81 MG tablet Take 81 mg by mouth daily. Patient not taking: Reported on 07/05/2021    [provider]  clonazePAM (KLONOPIN) 0.5 MG tablet Take 1 mg by mouth at bedtime. 11/08/15   [provider]  diclofenac Sodium (VOLTAREN) 1 % GEL Apply 4 g topically 2 (two) times daily.  01/02/19   [provider]  ibuprofen (ADVIL,MOTRIN) 200 MG tablet Take 200 mg by mouth every 6 (six) hours as needed.    [provider]  Lactase (LACTOSE INTOLERANCE PO) Take by mouth. One teaspoon three times daily before meals    [provider]  metoprolol tartrate (LOPRESSOR) 50 MG tablet Take one tablet two hours prior to cardiac CTA 07/05/21   Loel Dubonnet, NP  mirtazapine (REMERON) 30 MG tablet Take 30 mg by mouth at bedtime.    [provider]  omeprazole (PRILOSEC) 20 MG capsule Take 20 mg by mouth every morning.     [provider]  pregabalin (LYRICA) 150 MG capsule Take 150 mg by mouth as needed. May take 1-2 tablets at night 04/29/20   [provider]  valsartan (DIOVAN) 80 MG tablet Take 1 tablet (80 mg total) by mouth daily. 07/19/21   Loel Dubonnet, NP      Allergies    Fluoxetine hcl, Lisinopril, Tramadol, Statins, and Hydrocodone    Review of Systems   Review of Systems  Constitutional:  Negative for chills and fever.  HENT:  Negative for facial swelling and trouble swallowing.   Eyes:  Negative for photophobia and visual disturbance.  Respiratory:  Negative for cough and shortness of breath.   Cardiovascular:  Positive for syncope. Negative for chest pain and palpitations.  Gastrointestinal:  Negative for abdominal pain, nausea and vomiting.  Endocrine: Negative for polydipsia and polyuria.  Genitourinary:  Negative for difficulty urinating and hematuria.  Musculoskeletal:  Positive for arthralgias. Negative for gait problem and joint swelling.  Skin:  Negative for pallor and rash.  Neurological:  Positive for syncope, light-headedness and headaches.  Psychiatric/Behavioral:  Negative for agitation and confusion.    Physical Exam Updated Vital Signs BP 131/72    Pulse 77    Temp 98.3 F (36.8 C)    Resp 13    Wt 55.8 kg    LMP 10/23/2002    SpO2 98%    BMI 21.11 kg/m  Physical Exam Vitals and nursing note reviewed.  Constitutional:      General: She is not in acute distress.    Appearance: Normal appearance.  HENT:     Head: Normocephalic. No raccoon eyes, Battle's sign, right periorbital erythema or left periorbital erythema.     Comments: Hematoma  forehead    Right Ear: External ear normal.     Left Ear: External ear normal.     Nose: Nose normal.     Mouth/Throat:     Mouth: Mucous membranes are moist.  Eyes:     General: No scleral icterus.       Right eye: No discharge.        Left eye: No discharge.     Extraocular Movements: Extraocular movements intact.     Pupils: Pupils are equal, round, and reactive to light.  Cardiovascular:  Rate and Rhythm: Normal rate and regular rhythm.     Pulses: Normal pulses.     Heart sounds: Normal heart sounds.  Pulmonary:     Effort: Pulmonary effort is normal. No respiratory distress.     Breath sounds: Normal breath sounds.  Abdominal:     General: Abdomen is flat.     Tenderness: There is no abdominal tenderness.  Musculoskeletal:        General: Normal range of motion.     Cervical back: Normal range of motion.     Right lower leg: No edema.     Left lower leg: No edema.     Comments: No midline spinous process tenderness palpation percussion, no step-off or crepitus  Skin:    General: Skin is warm and dry.     Capillary Refill: Capillary refill takes less than 2 seconds.  Neurological:     Mental Status: She is alert and oriented to person, place, and time.     GCS: GCS eye subscore is 4. GCS verbal subscore is 5. GCS motor subscore is 6.     Cranial Nerves: Cranial nerves 2-12 are intact. No dysarthria or facial asymmetry.     Sensory: Sensation is intact.     Motor: Motor function is intact.     Coordination: Coordination is intact.     Gait: Gait is intact. Gait normal.  Psychiatric:        Mood and Affect: Mood normal.        Behavior: Behavior normal.    ED Results / Procedures / Treatments   Labs (all labs ordered are listed, but only abnormal results are displayed) Labs Reviewed  D-DIMER, QUANTITATIVE - Abnormal; Notable for the following components:      Result Value   D-Dimer, Quant 3.30 (*)    All other components within normal limits  BASIC METABOLIC  PANEL  CBC  URINALYSIS, ROUTINE W REFLEX MICROSCOPIC  HEPATIC FUNCTION PANEL  CBG MONITORING, ED  TROPONIN I (HIGH SENSITIVITY)  TROPONIN I (HIGH SENSITIVITY)    EKG EKG Interpretation  Date/Time:  Friday September 29 2021 10:48:30 EST Ventricular Rate:  81 PR Interval:  184 QRS Duration: 104 QT Interval:  378 QTC Calculation: 439 R Axis:   64 Text Interpretation: Normal sinus rhythm Incomplete right bundle branch block Anterior infarct (cited on or before 08-Aug-2015) Abnormal ECG When compared with ECG of 02-Oct-2018 07:09, No significant change was found similar to prior tracing no stemi Confirmed by Wynona Dove ([id number]) on 09/29/2021 10:51:19 AM  Radiology CT HEAD WO CONTRAST  Result Date: 09/29/2021 CLINICAL DATA:  Fall.  Head injury.  Syncope. EXAM: CT HEAD WITHOUT CONTRAST CT CERVICAL SPINE WITHOUT CONTRAST TECHNIQUE: Multidetector CT imaging of the head and cervical spine was performed following the standard protocol without intravenous contrast. Multiplanar CT image reconstructions of the cervical spine were also generated. RADIATION DOSE REDUCTION: This exam was performed according to the departmental dose-optimization program which includes automated exposure control, adjustment of the mA and/or kV according to patient size and/or use of iterative reconstruction technique. COMPARISON:  MRI cervical spine 05/28/2017 FINDINGS: CT HEAD FINDINGS Brain: No evidence of acute infarction, hemorrhage, hydrocephalus, extra-axial collection or mass lesion/mass effect. Mild white matter hypodensity bilaterally appears chronic. Vascular: Negative for hyperdense vessel Skull: Negative for skull fracture.  Left frontal scalp hematoma. Sinuses/Orbits: Paranasal sinuses clear.  Negative orbit Other: None CT CERVICAL SPINE FINDINGS Alignment: 3 mm anterolisthesis C4-5 Skull base and vertebrae: Negative for fracture or mass  Soft tissues and spinal canal: Negative for soft tissue mass or edema.  Atherosclerotic calcification carotid bifurcation bilaterally. Disc levels: Multilevel disc and facet degeneration throughout the cervical spine. No significant stenosis. Upper chest: Lung apices clear bilaterally Other: None IMPRESSION: 1. Left frontal scalp hematoma.  No acute intracranial abnormality 2. Cervical spondylosis.  Negative for fracture Electronically Signed   By: Franchot Gallo M.D.   On: 09/29/2021 11:18   CT Angio Chest PE W and/or Wo Contrast  Result Date: 09/29/2021 CLINICAL DATA:  Elevated D-dimer level, sudden onset weakness and loss of consciousness EXAM: CT ANGIOGRAPHY CHEST WITH CONTRAST TECHNIQUE: Multidetector CT imaging of the chest was performed using the standard protocol during bolus administration of intravenous contrast. Multiplanar CT image reconstructions and MIPs were obtained to evaluate the vascular anatomy. RADIATION DOSE REDUCTION: This exam was performed according to the departmental dose-optimization program which includes automated exposure control, adjustment of the mA and/or kV according to patient size and/or use of iterative reconstruction technique. CONTRAST:  118mL OMNIPAQUE IOHEXOL 350 MG/ML SOLN COMPARISON:  08/08/2015 and overlapping portions of cardiac CT from 07/18/2021 FINDINGS: Cardiovascular: No filling defect is identified in the pulmonary arterial tree to suggest pulmonary embolus. Atherosclerotic calcification of the aortic arch with some atheromatous narrowings at the proximal branch vessels. Mediastinum/Nodes: Unremarkable Lungs/Pleura: 0.3 cm right upper lobe pulmonary nodule on image 39 series 6, not well appreciated on the 08/08/2015 examination. Bandlike scarring or atelectasis laterally in the right lower lobe on image 110 of series 6. Two adjacent 5 mm pulmonary nodules in the posterior basal segment right lower lobe on image 100 of series 6 have a similar appearance and configuration to 08/08/15, and are likely benign. Subsegmental atelectasis  or scar or scarring laterally in the left lower lobe. Upper Abdomen: Prior cholecystectomy. 0.9 cm soft tissue density anterior to the stomach on image 124 series 4 is stable from 08/25/2018 probably an accessory spleen. There is also an accessory spleen just below the main spleen. Musculoskeletal: Bilateral breast implants. Thoracic kyphosis and mild thoracic spondylosis. Review of the MIP images confirms the above findings. IMPRESSION: 1. No filling defect is identified in the pulmonary arterial tree to suggest pulmonary embolus. 2.  Aortic Atherosclerosis (ICD10-I70.0). 3. Although two adjacent 0.5 cm right lower lobe pulmonary nodules are stable from 2016 and hence considered benign, there is a new 0.3 cm right upper lobe pulmonary nodule. No follow-up needed if patient is low-risk. Non-contrast chest CT can be considered in 12 months if patient is high-risk. This recommendation follows the consensus statement: Guidelines for Management of Incidental Pulmonary Nodules Detected on CT Images: From the Fleischner Society 2017; Radiology 2017; 284:228-243. 4. Mild scarring or atelectasis in both lower lobes. Electronically Signed   By: Van Clines M.D.   On: 09/29/2021 13:28   CT CERVICAL SPINE WO CONTRAST  Result Date: 09/29/2021 CLINICAL DATA:  Fall.  Head injury.  Syncope. EXAM: CT HEAD WITHOUT CONTRAST CT CERVICAL SPINE WITHOUT CONTRAST TECHNIQUE: Multidetector CT imaging of the head and cervical spine was performed following the standard protocol without intravenous contrast. Multiplanar CT image reconstructions of the cervical spine were also generated. RADIATION DOSE REDUCTION: This exam was performed according to the departmental dose-optimization program which includes automated exposure control, adjustment of the mA and/or kV according to patient size and/or use of iterative reconstruction technique. COMPARISON:  MRI cervical spine 05/28/2017 FINDINGS: CT HEAD FINDINGS Brain: No evidence of  acute infarction, hemorrhage, hydrocephalus, extra-axial collection or mass lesion/mass effect. Mild white  matter hypodensity bilaterally appears chronic. Vascular: Negative for hyperdense vessel Skull: Negative for skull fracture.  Left frontal scalp hematoma. Sinuses/Orbits: Paranasal sinuses clear.  Negative orbit Other: None CT CERVICAL SPINE FINDINGS Alignment: 3 mm anterolisthesis C4-5 Skull base and vertebrae: Negative for fracture or mass Soft tissues and spinal canal: Negative for soft tissue mass or edema. Atherosclerotic calcification carotid bifurcation bilaterally. Disc levels: Multilevel disc and facet degeneration throughout the cervical spine. No significant stenosis. Upper chest: Lung apices clear bilaterally Other: None IMPRESSION: 1. Left frontal scalp hematoma.  No acute intracranial abnormality 2. Cervical spondylosis.  Negative for fracture Electronically Signed   By: Franchot Gallo M.D.   On: 09/29/2021 11:18   DG Knee Complete 4 Views Left  Result Date: 09/29/2021 CLINICAL DATA:  Fall.  Left knee pain EXAM: LEFT KNEE - COMPLETE 4+ VIEW COMPARISON:  12/01/2013 FINDINGS: No evidence of fracture, dislocation, or joint effusion. No evidence of arthropathy or other focal bone abnormality. Soft tissues are unremarkable. IMPRESSION: Negative. Electronically Signed   By: Franchot Gallo M.D.   On: 09/29/2021 12:11    Procedures Procedures    Medications Ordered in ED Medications  acetaminophen (TYLENOL) tablet 650 mg (has no administration in time range)  sodium chloride 0.9 % bolus 1,000 mL ( Intravenous Stopped 09/29/21 1236)  iohexol (OMNIPAQUE) 350 MG/ML injection 100 mL (100 mLs Intravenous Contrast Given 09/29/21 1302)  ketorolac (TORADOL) 15 MG/ML injection 15 mg (15 mg Intravenous Given 09/29/21 1454)    ED Course/ Medical Decision Making/ A&P                           Medical Decision Making Amount and/or Complexity of Data Reviewed Labs: ordered. Radiology:  ordered.  Risk OTC drugs. Prescription drug management.    CC: Syncope, head injury, knee injury  This patient complains of above; this involves an extensive number of treatment options and is a complaint that carries with it a high risk of complications and morbidity. Vital signs were reviewed. Serious etiologies considered.  Record review:   Previous records obtained and reviewed   Additional history obtained from family  Work up as above, notable for:  Labs & imaging results that were available during my care of the patient were reviewed by me and considered in my medical decision making.   Patient with syncope, low risk Wells score, obtain D-dimer.  This is elevated.  Collect CT PE  I ordered imaging studies which included CT head CT PE, x-ray knee, CT cervical spine and I independently visualized and interpreted imaging which showed no acute process  C-collar cleared  Cardiac monitoring reviewed and interpreted personally which shows normal sinus rhythm  EKG as above  Social determinants of health include -   Management: Given IV fluids, analgesics.  Reassessment:  Patient reports symptoms greatly improved.  Patient has received echocardiogram in the past.  She feels back to baseline.  Neurologic exam is nonfocal.  Pain is improved.  She is ambulatory.  Steady gait.  Recommend outpatient cardiology follow-up.    Discussed close head injury, concussion precautions.  Supportive care at home.   Stable for discharge.   Patient presents with syncopal symptoms without worrisome features. Presentation most suggestive of neuro-cardiogenic or orthostatic cause. Very low suspicion for serious arrhythmia, cardiac ischemia or other serious etiology. ECG reviewed, no evidence of a cardiac arrhythmia such as Brugada, WPW, HOCM, IHSS, Long or short QT. Neurologic exam is nonfocal, not consistent with  CVA or primary neurologic abnormality. Patient appears safe for discharge with  outpatient observation and close PCP F/U. Syncope warnings discussed with patient. The patient has been instructed to return immediately if the symptoms worsen in any way. Patient verbalized understanding and is in agreement with current care plan. All questions answered prior to discharge.      This chart was dictated using voice recognition software.  Despite best efforts to proofread,  errors can occur which can change the documentation meaning.         Final Clinical Impression(s) / ED Diagnoses Final diagnoses:  Syncope, unspecified syncope type  Closed head injury, initial encounter    Rx / DC Orders ED Discharge Orders     None         Jeanell Sparrow, DO 09/29/21 1558

## 2021-09-29 NOTE — Discharge Instructions (Addendum)
Have someone stay with you until you feel stable. Do not drive, operate machinery, or play sports until your caregiver says it is okay. Keep all follow-up appointments as directed by your caregiver. Lie down right away if you start feeling like you might faint. Breathe deeply and steadily. Wait until all the symptoms have passed.Drink enough fluids to keep your urine clear or pale yellow. If you are taking blood pressure or heart medicine, get up slowly, taking several minutes to sit and then stand. This can reduce dizziness. SEEK IMMEDIATE MEDICAL CARE IF: You have a severe headache. You have unusual pain in the chest, abdomen, or back. You are bleeding from the mouth or rectum, or you have a black or tarry stool. You have an irregular or very fast heartbeat. You have pain with breathing. You have repeated fainting or seizure-like jerking during an episode. You faint when sitting or lying down. You have confusion. You have difficulty walking. You have severe weakness. You have vision problems. If you fainted, call your local emergency services - do not drive yourself to the hospital.   Please return to the emergency department immediately for any new or concerning symptoms, or if you get worse.   You had a small nodule on your lung, please have repeat imaging of your chest completed in 12 months to evaluate.  Coordinate through PCP

## 2021-10-26 ENCOUNTER — Emergency Department (HOSPITAL_BASED_OUTPATIENT_CLINIC_OR_DEPARTMENT_OTHER): Payer: Medicare HMO | Admitting: Radiology

## 2021-10-26 ENCOUNTER — Emergency Department (HOSPITAL_BASED_OUTPATIENT_CLINIC_OR_DEPARTMENT_OTHER): Payer: Medicare HMO

## 2021-10-26 ENCOUNTER — Encounter (HOSPITAL_BASED_OUTPATIENT_CLINIC_OR_DEPARTMENT_OTHER): Payer: Self-pay | Admitting: Emergency Medicine

## 2021-10-26 ENCOUNTER — Emergency Department (HOSPITAL_BASED_OUTPATIENT_CLINIC_OR_DEPARTMENT_OTHER)
Admission: EM | Admit: 2021-10-26 | Discharge: 2021-10-26 | Disposition: A | Discharge status: Home / Self Care | Payer: Medicare HMO | Attending: Emergency Medicine | Admitting: Emergency Medicine

## 2021-10-26 ENCOUNTER — Other Ambulatory Visit: Payer: Self-pay

## 2021-10-26 DIAGNOSIS — R059 Cough, unspecified: Secondary | ICD-10-CM | POA: Diagnosis not present

## 2021-10-26 DIAGNOSIS — R110 Nausea: Secondary | ICD-10-CM | POA: Insufficient documentation

## 2021-10-26 DIAGNOSIS — R197 Diarrhea, unspecified: Secondary | ICD-10-CM | POA: Insufficient documentation

## 2021-10-26 DIAGNOSIS — R1030 Lower abdominal pain, unspecified: Secondary | ICD-10-CM | POA: Diagnosis not present

## 2021-10-26 DIAGNOSIS — R0602 Shortness of breath: Secondary | ICD-10-CM | POA: Diagnosis not present

## 2021-10-26 DIAGNOSIS — J449 Chronic obstructive pulmonary disease, unspecified: Secondary | ICD-10-CM | POA: Diagnosis not present

## 2021-10-26 DIAGNOSIS — R531 Weakness: Secondary | ICD-10-CM | POA: Diagnosis not present

## 2021-10-26 DIAGNOSIS — Z20822 Contact with and (suspected) exposure to covid-19: Secondary | ICD-10-CM | POA: Diagnosis not present

## 2021-10-26 LAB — COMPREHENSIVE METABOLIC PANEL
ALT: 10 U/L (ref 0–44)
AST: 17 U/L (ref 15–41)
Albumin: 4.5 g/dL (ref 3.5–5.0)
Alkaline Phosphatase: 81 U/L (ref 38–126)
Anion gap: 11 (ref 5–15)
BUN: 15 mg/dL (ref 8–23)
CO2: 24 mmol/L (ref 22–32)
Calcium: 9.6 mg/dL (ref 8.9–10.3)
Chloride: 105 mmol/L (ref 98–111)
Creatinine, Ser: 0.68 mg/dL (ref 0.44–1.00)
GFR, Estimated: >60 mL/min (ref >60)
Glucose, Bld: 91 mg/dL (ref 70–99)
Potassium: 3.7 mmol/L (ref 3.5–5.1)
Sodium: 140 mmol/L (ref 135–145)
Total Bilirubin: 0.4 mg/dL (ref 0.3–1.2)
Total Protein: 7.1 g/dL (ref 6.5–8.1)

## 2021-10-26 LAB — URINALYSIS, ROUTINE W REFLEX MICROSCOPIC
Bilirubin Urine: NEGATIVE
Glucose, UA: NEGATIVE mg/dL
Hgb urine dipstick: NEGATIVE
Ketones, ur: 15 mg/dL — AB
Leukocytes,Ua: NEGATIVE
Nitrite: NEGATIVE
Protein, ur: 30 mg/dL — AB
Specific Gravity, Urine: 1.029 (ref 1.005–1.030)
pH: 5.5 (ref 5.0–8.0)

## 2021-10-26 LAB — CBC WITH DIFFERENTIAL/PLATELET
Abs Immature Granulocytes: 0.01 10*3/uL (ref 0.00–0.07)
Basophils Absolute: 0 10*3/uL (ref 0.0–0.1)
Basophils Relative: 0 %
Eosinophils Absolute: 0 10*3/uL (ref 0.0–0.5)
Eosinophils Relative: 0 %
HCT: 37.9 % (ref 36.0–46.0)
Hemoglobin: 12.9 g/dL (ref 12.0–15.0)
Immature Granulocytes: 0 %
Lymphocytes Relative: 17 %
Lymphs Abs: 1.2 10*3/uL (ref 0.7–4.0)
MCH: 31.4 pg (ref 26.0–34.0)
MCHC: 34 g/dL (ref 30.0–36.0)
MCV: 92.2 fL (ref 80.0–100.0)
Monocytes Absolute: 0.5 10*3/uL (ref 0.1–1.0)
Monocytes Relative: 7 %
Neutro Abs: 5.3 10*3/uL (ref 1.7–7.7)
Neutrophils Relative %: 76 %
Platelets: 211 10*3/uL (ref 150–400)
RBC: 4.11 MIL/uL (ref 3.87–5.11)
RDW: 13.4 % (ref 11.5–15.5)
WBC: 7 10*3/uL (ref 4.0–10.5)
nRBC: 0 % (ref 0.0–0.2)

## 2021-10-26 LAB — RESP PANEL BY RT-PCR (FLU A&B, COVID) ARPGX2
Influenza A by PCR: NEGATIVE
Influenza B by PCR: NEGATIVE
SARS Coronavirus 2 by RT PCR: NEGATIVE

## 2021-10-26 LAB — LIPASE, BLOOD: Lipase: 47 U/L (ref 11–51)

## 2021-10-26 MED ORDER — SODIUM CHLORIDE 0.9 % IV BOLUS
1000.0000 mL | Freq: Once | INTRAVENOUS | Status: AC
  Administered 2021-10-26: 1000 mL via INTRAVENOUS

## 2021-10-26 MED ORDER — DICYCLOMINE HCL 20 MG PO TABS: 20.0000 mg | ORAL_TABLET | Freq: Two times a day (BID) | ORAL | 0 refills | Status: DC

## 2021-10-26 MED ORDER — FENTANYL CITRATE PF 50 MCG/ML IJ SOSY
50.0000 ug | PREFILLED_SYRINGE | Freq: Once | INTRAMUSCULAR | Status: AC
  Administered 2021-10-26: 50 ug via INTRAVENOUS
  Filled 2021-10-26: qty 1

## 2021-10-26 MED ORDER — CIPROFLOXACIN HCL 500 MG PO TABS: 500.0000 mg | ORAL_TABLET | Freq: Two times a day (BID) | ORAL | 0 refills | Status: DC

## 2021-10-26 MED ORDER — ONDANSETRON HCL 4 MG/2ML IJ SOLN
4.0000 mg | Freq: Once | INTRAMUSCULAR | Status: AC
  Administered 2021-10-26: 4 mg via INTRAVENOUS
  Filled 2021-10-26: qty 2

## 2021-10-26 MED ORDER — IOHEXOL 300 MG/ML  SOLN
75.0000 mL | Freq: Once | INTRAMUSCULAR | Status: AC | PRN
  Administered 2021-10-26: 75 mL via INTRAVENOUS

## 2021-10-26 MED ORDER — METRONIDAZOLE 500 MG PO TABS: 500.0000 mg | ORAL_TABLET | Freq: Two times a day (BID) | ORAL | 0 refills | Status: DC

## 2021-10-26 MED ORDER — ONDANSETRON 4 MG PO TBDP: 4.0000 mg | ORAL_TABLET | Freq: Three times a day (TID) | ORAL | 0 refills | Status: DC | PRN

## 2021-10-26 NOTE — Discharge Instructions (Addendum)
It was our pleasure taking care of you here in the emergency department today  We have written you for a few medications, ciprofloxacin and Flagyl which are antibiotics  Bentyl which is for stomach pain as well as Zofran which is for nausea  Your stool studies are pending, however we are treating this as if it was infectious.  Follow-up with your gastroenterologist  Return for new or worsening symptoms  Use your albuterol inhaler at home for your shortness of breath

## 2021-10-26 NOTE — ED Triage Notes (Signed)
Pt states her body is full of infection. State lymphnodes are swollen denies any sore throat, foul smelling diarrhea , groin pain, lower back pain, SOB all x 2 weeks and just getting worse  Pt ambulatory, a&Ox4

## 2021-10-26 NOTE — ED Provider Notes (Signed)
[facility] EMER[facility]ENC[facility] DEPT Provider Note   CSN: [id number] Arrival date & time: 10/26/21  1229     History  Chief Complaint  Patient presents with   Diarrhea    Amanda Bennett is a 66 y.o. female here for multiple complaints.  Patient says her last 2 weeks she has had worsening diarrhea, up to 20 episodes of watery stool daily.  She is states this is very malodorous and thinks this is prior to her prior infections.  She denies any prior history of C. difficile.  No melena or blood per rectum.  She has associated abdominal pain.  Also associated suprapubic pain.  She denies any pelvic pain, vaginal discharge, dysuria or hematuria.  States she has had subjective chills at home.  Her last colonoscopy is approximately 1 year ago where she had precancerous lesions removed.  She denies any rectal pain, discharge.  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    States she has also had some cough, shortness of breath over the last month since she was seen herein the ED.  She feels like her lymph nodes are swollen in her neck.  No headache, chest pain, back pain, lower extremity pain, no history of PE or DVT.  She feels dehydrated due to her multiple episodes of loose stool.  No recent travel, antibiotics, sick contacts. CTA chest neg for PE at that time.  GI - [place]  HPI     Home Medications Prior to Admission medications   Medication Sig Start Date End Date Taking? Authorizing Provider  ciprofloxacin (CIPRO) 500 MG tablet Take 1 tablet (500 mg total) by mouth every 12 (twelve) hours. 10/26/21  Yes Sheenah Dimitroff A, PA-C  dicyclomine (BENTYL) 20 MG tablet Take 1 tablet (20 mg total) by mouth 2 (two) times daily. 10/26/21  Yes [place] Trawick A, PA-C  metroNIDAZOLE (FLAGYL) 500 MG tablet Take 1 tablet (500 mg total) by mouth 2 (two) times daily. 10/26/21  Yes Hewitt Garner A, PA-C  ondansetron (ZOFRAN-ODT) 4 MG disintegrating tablet Take 1 tablet (4 mg total) by  mouth every 8 (eight) hours as needed for nausea or vomiting. 10/26/21  Yes Marylu Dudenhoeffer A, PA-C  clonazePAM (KLONOPIN) 0.5 MG tablet Take 1 mg by mouth at bedtime. 11/08/15   [provider]  diclofenac Sodium (VOLTAREN) 1 % GEL Apply 4 g topically 2 (two) times daily.  01/02/19   [provider]  ibuprofen (ADVIL,MOTRIN) 200 MG tablet Take 200 mg by mouth every 6 (six) hours as needed.    [provider]  Lactase (LACTOSE INTOLERANCE PO) Take by mouth. One teaspoon three times daily before meals    [provider]  metoprolol tartrate (LOPRESSOR) 50 MG tablet Take one tablet two hours prior to cardiac CTA 07/05/21   Loel Dubonnet, NP  mirtazapine (REMERON) 30 MG tablet Take 30 mg by mouth at bedtime.    [provider]  omeprazole (PRILOSEC) 20 MG capsule Take 20 mg by mouth every morning.     [provider]  pregabalin (LYRICA) 150 MG capsule Take 150 mg by mouth as needed. May take 1-2 tablets at night 04/29/20   [provider]  valsartan (DIOVAN) 80 MG tablet Take 1 tablet (80 mg total) by mouth daily. 07/19/21   Loel Dubonnet, NP      Allergies    Fluoxetine hcl, Lisinopril, Tramadol, Statins, and Hydrocodone    Review of Systems   Review of Systems  Constitutional:  Positive for chills and fatigue.  HENT: Negative.    Eyes: Negative.   Respiratory:  Positive for shortness of breath. Negative for apnea, cough, choking, chest tightness, wheezing and stridor.   Cardiovascular: Negative.   Gastrointestinal:  Positive for abdominal pain, diarrhea and nausea. Negative for abdominal distention, anal bleeding, blood in stool, constipation, rectal pain and vomiting.  Endocrine: Negative.   Genitourinary: Negative.   Musculoskeletal: Negative.   Skin: Negative.   Neurological:  Positive for weakness. Negative for dizziness, tremors, seizures, speech difficulty, light-headedness, numbness and headaches.  All other systems  reviewed and are negative.  Physical Exam Updated Vital Signs BP (!) 149/98    Pulse 70    Temp 98.7 F (37.1 C) (Oral)    Resp 14    Ht 5\' 4"  (1.626 m)    Wt 49.9 kg    LMP 10/23/2002    SpO2 100%    BMI 18.88 kg/m  Physical Exam Vitals and nursing note reviewed.  Constitutional:      General: She is not in acute distress.    Appearance: She is well-developed. She is not ill-appearing, toxic-appearing or diaphoretic.  HENT:     Head: Normocephalic and atraumatic.     Nose: Nose normal.     Mouth/Throat:     Mouth: Mucous membranes are moist.  Eyes:     Pupils: Pupils are equal, round, and reactive to light.  Cardiovascular:     Rate and Rhythm: Normal rate.     Pulses: Normal pulses.          Radial pulses are 2+ on the right side and 2+ on the left side.       Dorsalis pedis pulses are 2+ on the right side and 2+ on the left side.     Heart sounds: Normal heart sounds.  Pulmonary:     Effort: Pulmonary effort is normal. No respiratory distress.     Breath sounds: Normal breath sounds. No stridor. No wheezing, rhonchi or rales.     Comments: Clear bilaterally, speaks in full sentences without difficulty Chest:     Chest wall: No tenderness.  Abdominal:     General: There is no distension.     Tenderness: There is abdominal tenderness. There is no right CVA tenderness, left CVA tenderness, guarding or rebound.     Hernia: No hernia is present.     Comments: Soft, generalized tenderness, no rebound or guarding  Musculoskeletal:        General: No swelling, tenderness, deformity or signs of injury. Normal range of motion.     Cervical back: Normal range of motion.     Right lower leg: No edema.     Left lower leg: No edema.     Comments: No bony tenderness, full range of motion, compartment soft.  No lower extremity edema.  Skin:    General: Skin is warm and dry.     Capillary Refill: Capillary refill takes less than 2 seconds.  Neurological:     General: No focal deficit  present.     Mental Status: She is alert and oriented to person, place, and time.  Psychiatric:        Mood and Affect: Mood normal.    ED Results / Procedures / Treatments   Labs (all labs ordered are listed, but only abnormal results are displayed) Labs Reviewed  URINALYSIS, ROUTINE W REFLEX MICROSCOPIC - Abnormal; Notable for the following components:      Result Value  Ketones, ur 15 (*)    Protein, ur 30 (*)    All other components within normal limits  RESP PANEL BY RT-PCR (FLU A&B, COVID) [id number]  GASTROINTESTINAL PANEL BY PCR, STOOL (REPLACES STOOL CULTURE)  CBC WITH DIFFERENTIAL/PLATELET  COMPREHENSIVE METABOLIC PANEL  LIPASE, BLOOD    EKG EKG Interpretation  Date/Time:  Thursday October 26 2021 13:26:21 EST Ventricular Rate:  77 PR Interval:  168 QRS Duration: 113 QT Interval:  390 QTC Calculation: 442 R Axis:   61 Text Interpretation: Sinus rhythm Probable left atrial enlargement Incomplete right bundle branch block Confirmed by Nanda Quinton [id number]) on 10/26/2021 1:33:06 PM  Radiology DG Chest 2 View  Result Date: 10/26/2021 CLINICAL DATA:  Shortness of breath EXAM: CHEST - 2 VIEW COMPARISON:  Radiograph 08/08/2015 chest CT 09/29/2021 FINDINGS: Cardiomediastinal silhouette is within normal limits. There is no focal airspace consolidation. Scattered lung scarring. Pulmonary hyperinflation with flattened hemidiaphragms. There is no large pleural effusion. There is no visible pneumothorax. There is no acute osseous abnormality. Probable distal left clavicle resection. Old right upper rib injuries. IMPRESSION: No evidence of acute cardiopulmonary disease.  Findings of COPD. Electronically Signed   By: Maurine Simmering M.D.   On: 10/26/2021 13:54   CT Abdomen Pelvis W Contrast  Result Date: 10/26/2021 CLINICAL DATA:  Diarrhea. EXAM: CT ABDOMEN AND PELVIS WITH CONTRAST TECHNIQUE: Multidetector CT imaging of the abdomen and pelvis was performed using the standard protocol  following bolus administration of intravenous contrast. RADIATION DOSE REDUCTION: This exam was performed according to the departmental dose-optimization program which includes automated exposure control, adjustment of the mA and/or kV according to patient size and/or use of iterative reconstruction technique. CONTRAST:  29mL OMNIPAQUE IOHEXOL 300 MG/ML  SOLN COMPARISON:  CT abdomen and pelvis 08/25/2018 FINDINGS: Lower chest: Centrilobular emphysema. Two adjacent right lower lobe pulmonary nodules measuring 5 mm, unchanged from 2019 and likely benign. No basilar airspace consolidation or pleural effusion. Hepatobiliary: The liver is enlarged, measuring 20 cm in craniocaudal length. No focal liver abnormality is seen. Mild intrahepatic and extrahepatic biliary dilatation is unchanged and likely physiologic following cholecystectomy. Pancreas: Unremarkable. Spleen: Unremarkable. Adrenals/Urinary Tract: Unremarkable adrenal glands. No renal calculi or hydronephrosis. Unchanged 1 cm left renal cyst. Subcentimeter hypodensity in the right kidney, too small to fully characterize. Unremarkable bladder. Stomach/Bowel: The stomach is unremarkable. There is no evidence of bowel obstruction. No definite bowel inflammation is identified, although underdistension limits assessment, particularly of the transverse colon. The appendix is unremarkable. Vascular/Lymphatic: Abdominal aortic atherosclerosis without aneurysm. No enlarged lymph nodes. Reproductive: Status post hysterectomy. No adnexal masses. Other: No ascites or pneumoperitoneum. Musculoskeletal: Advanced facet arthrosis in the lower lumbar spine with unchanged grade 1 anterolisthesis of L3 on L4 and L4 on L5. no suspicious osseous lesion. IMPRESSION: 1. No acute abnormality identified in the abdomen or pelvis. 2. Hepatomegaly. 3. Aortic Atherosclerosis (ICD10-I70.0) and Emphysema (ICD10-J43.9). Electronically Signed   By: Logan Bores M.D.   On: 10/26/2021 14:52     Procedures Procedures    Medications Ordered in ED Medications  sodium chloride 0.9 % bolus 1,000 mL (1,000 mLs Intravenous New Bag/Given 10/26/21 1351)  ondansetron (ZOFRAN) injection 4 mg (4 mg Intravenous Given 10/26/21 1351)  fentaNYL (SUBLIMAZE) injection 50 mcg (50 mcg Intravenous Given 10/26/21 1352)  iohexol (OMNIPAQUE) 300 MG/ML solution 75 mL (75 mLs Intravenous Contrast Given 10/26/21 1428)   ED Course/ Medical Decision Making/ A&P    66 year old extensive past medical history significant for multiple GI issues including rectal abscess,  fistula, recurrent diarrhea, constipation, chronic low back pain here for evaluation of multiple complaints.  Patient states she has had worsening diarrhea over the last 2 weeks with greater than 20 episodes daily which she feels consistent with prior infections.  She denies any prior history of C. difficile.  No recent antibiotics or travel.  She denies any rectal pain, rectal bleeding or blood in her stool.  She has diffuse abdominal tenderness but does not radiate.  She has no urinary complaints.  Does also admit to some cervical lymphadenopathy as well as some shortness of breath with associated cough which began 1 month ago. She was seen in ED at that time. I personally reviewed and interpreted her labs and imaging from that time, CTA chest negative for pulmonary embolism however did have pulmonary nodules which they recommend follow-up in 1 year chest pain, back pain.  Trop neg. She is not clinically have evidence of VTE on exam.  No exertional or pleuritic chest pain, no DOE.  Wells criteria low risk.  I do not feel she needs repeat CTA chest at this time at this given her sx are unchanged, VS stable.  Labs and imaging personally viewed and interpreted:  CBC without leukocytosis Metabolic panel without electrolyte, renal or liver abnormality Lipase 47 UA negative for infection C. difficile study unable to be collected due to consistency of  stool collected in ED DG chest with COPD EKG without ischemic changes>> incomplete RBBB, similar to prior EKGS reviewed in Epic COVID/ Flu neg  Patient reassessed.  Tolerating p.o. intake.  Given greater than 20 loose stools daily, generalized abdominal pain we will treat for possible infectious cause of diarrhea.  She understands to follow-up with her GI provider for final results of her GI pathogen study.  Discussed bland diet, Bentyl as needed for abdominal pain, return for new or worsening symptoms.  Patient agreeable.  With regards to her shortness of breath.  This has been ongoing for months.  She has albuterol inhaler at home that she does not use.  Thankfully she has no wheeze on exam however at this time I have low suspicion for acute ACS, PE, dissection, pneumothorax.  We will have her use her albuterol inhaler as needed to see if that helps.  She will return for any worsening symptoms.  The patient has been appropriately medically screened and/or stabilized in the ED. I have low suspicion for any other emergent medical condition which would require further screening, evaluation or treatment in the ED or require inpatient management.  Patient is hemodynamically stable and in no acute distress.  Patient able to ambulate in department prior to ED.  Evaluation does not show acute pathology that would require ongoing or additional emergent interventions while in the emergency department or further inpatient treatment.  I have discussed the diagnosis with the patient and answered all questions.  Pain is been managed while in the emergency department and patient has no further complaints prior to discharge.  Patient is comfortable with plan discussed in room and is stable for discharge at this time.  I have discussed strict return precautions for returning to the emergency department.  Patient was encouraged to follow-up with PCP/specialist refer to at discharge.                            Medical  Decision Making Amount and/or Complexity of Data Reviewed External Data Reviewed: labs, radiology and notes.    Details:  [facility] Labs: ordered. Decision-making details documented in ED Course. Radiology: ordered and independent interpretation performed. Decision-making details documented in ED Course. ECG/medicine tests: ordered and independent interpretation performed. Decision-making details documented in ED Course.  Risk OTC drugs. Prescription drug management. Parenteral controlled substances. Diagnosis or treatment significantly limited by social determinants of health. Risk Details: Do not feel patient needs additional labs, imaging, hospitalization at this time.  We will have her follow-up closely with outpatient provider          Final Clinical Impression(s) / ED Diagnoses Final diagnoses:  Diarrhea of presumed infectious origin    Rx / DC Orders ED Discharge Orders          Ordered    ciprofloxacin (CIPRO) 500 MG tablet  Every 12 hours        10/26/21 1514    metroNIDAZOLE (FLAGYL) 500 MG tablet  2 times daily        10/26/21 1514    ondansetron (ZOFRAN-ODT) 4 MG disintegrating tablet  Every 8 hours PRN        10/26/21 1514    dicyclomine (BENTYL) 20 MG tablet  2 times daily        10/26/21 1514              Elinor Kleine A, PA-C 10/26/21 1528    Long, Wonda [facility], MD 10/27/21 [id number]

## 2021-10-26 NOTE — ED Notes (Signed)
Patient transported to CT 

## 2021-10-27 LAB — GASTROINTESTINAL PANEL BY PCR, STOOL (REPLACES STOOL CULTURE)

## 2021-10-30 ENCOUNTER — Telehealth (HOSPITAL_COMMUNITY): Payer: Self-pay

## 2021-11-20 ENCOUNTER — Ambulatory Visit: Payer: Medicare HMO | Admitting: Internal Medicine

## 2021-11-20 ENCOUNTER — Encounter: Payer: Self-pay | Admitting: Internal Medicine

## 2021-11-20 ENCOUNTER — Other Ambulatory Visit: Payer: Medicare HMO

## 2021-11-20 VITALS — BP 118/64 | HR 62 | Wt 121.0 lb

## 2021-11-20 DIAGNOSIS — K580 Irritable bowel syndrome with diarrhea: Secondary | ICD-10-CM

## 2021-11-20 DIAGNOSIS — K529 Noninfective gastroenteritis and colitis, unspecified: Secondary | ICD-10-CM | POA: Diagnosis not present

## 2021-11-20 DIAGNOSIS — K644 Residual hemorrhoidal skin tags: Secondary | ICD-10-CM | POA: Diagnosis not present

## 2021-11-20 DIAGNOSIS — Z9049 Acquired absence of other specified parts of digestive tract: Secondary | ICD-10-CM

## 2021-11-20 DIAGNOSIS — R160 Hepatomegaly, not elsewhere classified: Secondary | ICD-10-CM

## 2021-11-20 MED ORDER — HYDROCORTISONE (PERIANAL) 2.5 % EX CREA: 1.0000 "application " | TOPICAL_CREAM | Freq: Two times a day (BID) | CUTANEOUS | 0 refills | Status: DC

## 2021-11-20 MED ORDER — DICYCLOMINE HCL 20 MG PO TABS: 20.0000 mg | ORAL_TABLET | Freq: Four times a day (QID) | ORAL | 2 refills | Status: DC | PRN

## 2021-11-20 MED ORDER — CHOLESTYRAMINE 4 G PO PACK
4.0000 g | PACK | Freq: Every day | ORAL | 0 refills | Status: DC
Start: 2021-11-20 — End: 2023-11-11

## 2021-11-20 NOTE — Patient Instructions (Addendum)
Your provider has requested that you go to the basement level for lab work before leaving today. Press "B" on the elevator. The lab is located at the first door on the left as you exit the elevator. ? ?Due to recent changes in healthcare laws, you may see the results of your imaging and laboratory studies on MyChart before your provider has had a chance to review them.  We understand that in some cases there may be results that are confusing or concerning to you. Not all laboratory results come back in the same time frame and the provider may be waiting for multiple results in order to interpret others.  Please give [place] 48 hours in order for your provider to thoroughly review all the results before contacting the office for clarification of your results.  ? ?We have sent medications to your pharmacy for you to pick up at your convenience. ? ? ?I appreciate the opportunity to care for you. ?Silvano Rusk, MD, [facility] ? ?

## 2021-11-20 NOTE — Progress Notes (Signed)
Amanda Bennett 66 y.o. [birth year] [id number]  Assessment & Plan:   Encounter Diagnoses  Name Primary?   Chronic diarrhea Yes   Irritable bowel syndrome with diarrhea    Status post cholecystectomy ? 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  She could have a component of bile salt diarrhea.  Chart review shows that she did take this in 2015 after cholecystectomy.  However I think she had diarrhea issues prior to that as well.  She is not having incontinence issues fortunately.  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  Further plans pending these results and response to treatment.  I will see her back in 6 to 8 weeks. Orders Placed This Encounter  Procedures   Calprotectin, Fecal   Tissue transglutaminase, IgA   IgA    Meds ordered this encounter  Medications   dicyclomine (BENTYL) 20 MG tablet    Sig: Take 1 tablet (20 mg total) by mouth every 6 (six) hours as needed for spasms (abdominal pain).    Dispense:  120 tablet    Refill:  2   cholestyramine (QUESTRAN) 4 g packet    Sig: Take 1 packet (4 g total) by mouth daily with supper.    Dispense:  90 each    Refill:  0   hydrocortisone (ANUSOL-HC) 2.5 % rectal cream    Sig: Place 1 application. rectally 2 (two) times daily.    Dispense:  30 g    Refill:  0    Subjective:   Chief Complaint: Diarrhea and abdominal pain  HPI 66 year old woman here because of exacerbation of diarrhea problems.  She complains of alternating diarrhea and constipation but when she talks about constipation she moves her bowels several times a day but small stools with incomplete.  Then there can be days with  terrible watery diarrhea.  She was in the emergency department in the middle of February due to severe profuse diarrhea where a CT scan showed a 20 cm liver but no changes in her GI tract otherwise, and had a negative stool PCR test and labs were otherwise unrevealing.  She was treated with antibiotics and dicyclomine.  She is improved but still struggles.  Her hemorrhoids are a bit swollen and irritated she says.  She continues with mucus production at times.  Abdominal cramps are an issue also.  She has regained most of the weight she lost with a diarrheal illness.  She is using Imodium with some benefit.  The dicyclomine was a limited prescription and she has finished with it.  She has a lot of chronic back pain issues and because of a seizure or syncopal episode she stopped Lyrica.  She is using ibuprofen 2 a day and Tylenol 2 a day alternating.  Previous visits in the last couple of years including colonoscopy and pathology as below.  Wt Readings from Last 3 Encounters:  11/20/21 121 lb (54.9 kg)  10/26/21 110 lb (49.9 kg)  09/29/21 123 lb (55.8 kg)    11/2020 colonoscopy Hemorrhoids found on perianal exam. - Decreased sphincter tone found on digital rectal exam. - One 5  mm polyp in the sigmoid colon, removed with a cold snare. Resected and retrieved. - hyperplastic polyp - Severe diverticulosis in the sigmoid colon and in the descending colon. There was narrowing of the colon in association with the diverticular opening. - The examination was otherwise normal on direct and retroflexion views. I suspect the colorectal surgeon saw mucous discharge at anoscopy  11/2019 The patient is a 66 year old woman I saw in October of last year, and referred to colorectal surgery for follow-up of anal intraepithelial neoplasia resection.  At that time her complaints were fecal incontinence, rectal pain, constipation.  She was using lactulose with some benefit.  There was a question of family history of colon  cancer but it was metastatic melanoma.  I did not think she needed a colonoscopy at that time.  She did go to [facility] and saw colorectal surgery, note reviewed.  Dr. Vicenta Dunning saw the patient, and anoscopy was performed and showed no visible lesions but mucus-like drainage in the rectal vault and he recommended a colonoscopy.  It was to be done at [facility] but the patient decided to return here to schedule.  Now she is having more of a diarrhea and persistent drainage of mucus.  She says her rectum burns when she is passing mucus and "pus".  No bleeding described.  Recall that she had nonhealing perianal abscess treated in the past before diagnosis of anal intraepithelial neoplasia.   I had recommended pelvic floor physical therapy and she says she went to the [place] [facility] once and said "I can control my muscles" and that is not going to help me.  I do not see records in the chart of pelvic PT visit.  06/2020   Patient is here for follow-up due to rectal pain bowel habit issues and abdominal pain issues, she had a nonhealing perirectal abscess and had this treated surgically by Dr. 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  Abscess began in October 2019 she had incision and drainage she had antibiotics and because it did not resolve she was taken to the operating room in January 2020 and had a seton placed and that is when she had the AIN biopsy results.  She had actually been seen at [facility] in October and had incision and drainage from a surgeon there also.   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  She had a follow-up visit in January of this year and there was no evidence of recurrence with plans for a 90-monthfollow-up.   The patient tells me she is dissatisfied with the care she received through Dr. TMarcello Mooresand will  not go back.  She said there was not adequate communication and she is having constant rectal pain worse with defecation and lower abdominal pain as well.  She has had some difficulty with defecation with fecal incontinence and constipation as well.  Lactulose is being used fairly regularly as recommended by her PCP and that has helped with pain though it has not relieved it completely it has limited if not stop fecal incontinence that she would have.  When she walks she has pain.  She has not been able to ride horses either since surgery.   Additionally we thought she had a family history of colon cancer but it turns out her father had melanoma metastatic to  the bowel and not colon cancer (learned through the patient's sister).  She has had colonoscopies in 2013 in 2018 without colorectal neoplasia though she does have left-sided diverticulosis.   PE NL anal resting tone and squeeze  Allergies  Allergen Reactions   Fluoxetine Hcl Other (See Comments)    Suicidal    Lisinopril Cough   Tramadol     Nervous, anxiety   Statins     Severe myalgia with Atorvastatin and Rosuvastatin   Hydrocodone Nausea Only and Rash    Shaking, nervous   Current Meds  Medication Sig   cholestyramine (QUESTRAN) 4 g packet Take 1 packet (4 g total) by mouth daily with supper.   clonazePAM (KLONOPIN) 0.5 MG tablet Take 1 mg by mouth at bedtime.   dicyclomine (BENTYL) 20 MG tablet Take 1 tablet (20 mg total) by mouth every 6 (six) hours as needed for spasms (abdominal pain).   hydrocortisone (ANUSOL-HC) 2.5 % rectal cream Place 1 application. rectally 2 (two) times daily.   Lactase (LACTOSE INTOLERANCE PO) Take by mouth. One teaspoon three times daily before meals   mirtazapine (REMERON) 30 MG tablet Take 30 mg by mouth at bedtime.   valsartan (DIOVAN) 80 MG tablet Take 1 tablet (80 mg total) by mouth daily. 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   Anxiety    Bruises easily    Cervical spinal stenosis 2008   Chronic constipation    Chronic pain syndrome    neck and lower back   COPD (chronic obstructive pulmonary disease) (HCC)    Depression    Family history of adverse reaction to anesthesia    mother--  ponv   GERD (gastroesophageal reflux disease)    History of basal cell carcinoma excision    History of prolonged Q-T interval on ECG    per cardiologist, dr Fletcher Anon, note in epic-- in the setting of treatment with medications that can cause prolonged QT, went back to normal after stopping the medications (was seen by dr Caryl Comes no further workup recommended)   History of staph infection    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   Hypertension    Insomnia    Lesion of ulnar nerve    Mild carotid artery disease ([place])    per duplex 08-23-2017  bilateral ICA 1-39%   OA (osteoarthritis)    neck and back   Occlusion of left vertebral artery    noted 05-28-2017 MRI;  followed by cardiologist, dr Fletcher Anon (per cardiologist has collaterals)   Osteoporosis    Perirectal abscess 07/08/2018   PONV (postoperative nausea and vomiting)    sometimes queezy   Primary localized osteoarthrosis, pelvic region and thigh    Wears dentures    upper   Wears glasses    Past Surgical History:  Procedure Laterality Date   ABDOMINAL HYSTERECTOMY  age 65s   [place] Bilateral 2018   CHOLECYSTECTOMY     CHOLECYSTECTOMY  10/30/2011   Procedure: LAPAROSCOPIC CHOLECYSTECTOMY WITH INTRAOPERATIVE CHOLANGIOGRAM;  Surgeon: Imogene Burn. Georgette Dover, MD;  Location: [facility];  Service: General;  Laterality: N/A;   COLONOSCOPY     DORSAL COMPARTMENT RELEASE Left 07/20/2015   Procedure: RELEASE DORSAL COMPARTMENT (DEQUERVAIN);  Surgeon: Corky Mull, MD;  Location: [place];  Service: Orthopedics;  Laterality: Left;   EVALUATION UNDER ANESTHESIA  WITH FISTULECTOMY N/A 10/02/2018    Procedure: ANAL EXAM UNDER ANESTHESIA;  Surgeon: Leighton Ruff, MD;  Location: [facility];  Service: General;  Laterality: N/A;   HEMORRHOID SURGERY  11/25/2006   dr Bubba Camp  '@[facility]'$    I & D, ORIF RIGHT RING FINGER  Oct 9th, 15th ,2009   near amputation from dog bite   [place] N/A 07/08/2018   Procedure: [place];  Surgeon: Benjamine Sprague, DO;  Location: [facility];  Service: General;  Laterality: N/A;   INGUINAL LYMPH NODE BIOPSY Right 02/03/2014   Procedure: INGUINAL LYMPH NODE BIOPSY;  Surgeon: Zenovia Jarred, MD;  Location: [place];  Service: General;  Laterality: Right;  right inguinal area   LAPAROSCOPIC SALPINGOOPHERECTOMY Bilateral age 89s   LIGATION OF INTERNAL FISTULA TRACT N/A 04/01/2019   Procedure: LIGATION OF INTERNAL FISTULA TRACT RESECTION OF ANAL LESION;  Surgeon: Leighton Ruff, MD;  Location: [place];  Service: General;  Laterality: N/A;   ORIF CLAVICLE FRACTURE Right 2003   HARDWARE REMOVAL 08-22-2004 BY DR Veverly Fells '@[facility]'$    PLACEMENT OF SETON N/A 10/02/2018   Procedure: PLACEMENT OF SETON VS FISTULOTOMY, BIOPSY;  Surgeon: Leighton Ruff, MD;  Location: [place];  Service: General;  Laterality: N/A;   SHOULDER ARTHROSCOPY Left 07/20/2015   Procedure: ARTHROSCOPY SHOULDER DEBRIDEMENT AND DECOMPRESSION;  Surgeon: Corky Mull, MD;  Location: [place];  Service: Orthopedics;  Laterality: Left;   SHOULDER ARTHROSCOPY Left 01/12/2016   Procedure: ARTHROSCOPY SHOULDER WITH DEBRIDEMENT AND EXCISION OF THE DISTAL CLAVICLE;  Surgeon: Corky Mull, MD;  Location: [facility];  Service: Orthopedics;  Laterality: Left;   Social History   Social History Narrative   ** Merged History Encounter **       Disabled 2 daughters Reports single marital status Smoker, 2 caffeinated beverages a day no other tobacco no drug use no alcohol   family  history includes Cancer in her father and paternal grandmother; Colon cancer (age of onset: 66) in her father; Esophageal cancer in her father; Liver disease in her father; Other in her mother; Pancreatic cancer in her father.   Review of Systems See HPI  Objective:   Physical Exam BP 118/64    Pulse 62    Wt 121 lb (54.9 kg)    LMP 10/23/2002    BMI 20.77 kg/m  Thin ww NAD Lungs cta Cor NL S1S2 no rmg Abd soft and diffusely render and no HSM Amnda hooks LPN present  Inspection of anoderm swollen perineal body and inflaed swollen RA external hemorrhoid

## 2021-11-21 ENCOUNTER — Telehealth: Payer: Self-pay | Admitting: Internal Medicine

## 2021-11-21 ENCOUNTER — Other Ambulatory Visit: Payer: Medicare HMO

## 2021-11-21 DIAGNOSIS — K529 Noninfective gastroenteritis and colitis, unspecified: Secondary | ICD-10-CM

## 2021-11-21 DIAGNOSIS — K580 Irritable bowel syndrome with diarrhea: Secondary | ICD-10-CM

## 2021-11-21 DIAGNOSIS — Z9049 Acquired absence of other specified parts of digestive tract: Secondary | ICD-10-CM

## 2021-11-21 LAB — IGA: Immunoglobulin A: 61 mg/dL — ABNORMAL LOW (ref 70–320)

## 2021-11-21 LAB — TISSUE TRANSGLUTAMINASE, IGA: (tTG) Ab, IgA: <1 U/mL

## 2021-11-21 NOTE — Telephone Encounter (Signed)
Pt called in stating that she recently had a CT scan and Dr Carlean Purl informed her that she has an Enlarged Kidney: ?Pt states that she is having abdominal and back pain: Pt is questioning if she needs a referral for a  urologist: ?Please advise    ?

## 2021-11-21 NOTE — Telephone Encounter (Signed)
She has a normal variation in her liver NOT kidney  ?Right half of liver is elongated ?Not a problem and not related to her sxs ? ?She has arthritis in spine and that is causing back pain I bet - she should seek advice and help from PCP for the sxs she asks about ?

## 2021-11-21 NOTE — Telephone Encounter (Signed)
Patient called stating she had a recent CT scan at the hospital and is seeking a referral to proceed with treatment. Please call to advise. ?

## 2021-11-21 NOTE — Telephone Encounter (Signed)
Pt was notified that Dr. Carlean Purl stated that She has a normal variation in her liver NOT kidney  ?Right half of liver is elongated ?Not a problem and not related to her sxs: She has arthritis in spine and that is causing back pain I bet - she should seek advice and help from PCP for the sxs she asks about:  ?Pt verbalized understanding with all questions answered.  ? ?

## 2021-11-23 ENCOUNTER — Other Ambulatory Visit: Payer: Self-pay | Admitting: Internal Medicine

## 2021-11-25 LAB — CALPROTECTIN, FECAL: Calprotectin, Fecal: 25 ug/g (ref 0–120)

## 2022-01-15 ENCOUNTER — Encounter: Payer: Self-pay | Admitting: Internal Medicine

## 2022-01-15 ENCOUNTER — Ambulatory Visit: Payer: Medicare HMO | Admitting: Internal Medicine

## 2022-01-15 VITALS — BP 114/60 | HR 83 | Ht 64.0 in | Wt 116.0 lb

## 2022-01-15 DIAGNOSIS — R630 Anorexia: Secondary | ICD-10-CM

## 2022-01-15 DIAGNOSIS — Z636 Dependent relative needing care at home: Secondary | ICD-10-CM

## 2022-01-15 DIAGNOSIS — K580 Irritable bowel syndrome with diarrhea: Secondary | ICD-10-CM | POA: Diagnosis not present

## 2022-01-15 DIAGNOSIS — F439 Reaction to severe stress, unspecified: Secondary | ICD-10-CM | POA: Diagnosis not present

## 2022-01-15 MED ORDER — MIRTAZAPINE 45 MG PO TABS: 45.0000 mg | ORAL_TABLET | Freq: Every day | ORAL | 3 refills | Status: DC

## 2022-01-15 NOTE — Progress Notes (Signed)
? ?Amanda Bennett 66 y.o. 1956/06/02 [id number] ? ?Assessment & Plan:  ? ?Encounter Diagnoses  ?Name Primary?  ? Irritable bowel syndrome with diarrhea Yes  ? Anorexia   ? Caregiver burden   ? Situational stress   ? ?Continue current treatment for IBS-D.  Adjust dose lower with cholestyramine to prevent constipation. ? ?Regarding anorexia, increase mirtazapine to 45 mg nightly.  May need to follow-up with PCP further regarding this. ? ?We talked about trying to get some nutrient dense foods and such as eggs, whole fat yogurt, and she could try preferred protein drinks (high-protein shakes but do not have sugar-she understands avoiding excess sugars). ? ?See me in a year routinely sooner as needed ? ?CC: Alvester Chou, NP ? ? ?Subjective:  ? ?Chief Complaint: Diarrhea, anorexia ? ?HPI ?Amanda Bennett is a 66 year old woman with IBS-diarrhea predominant, here for follow-up.  She was seen in March 2023 and at that point I had her do some testing, previously a GI PCR panel was negative in February and then a fecal calprotectin was 25.  I had started her on cholestyramine 4 g daily and that is going pretty well with some constipation so she is modulating the dose.  In general she feels much better with her diarrhea but is now having difficulty with pretty profound anorexia.  She is stressed because she is caring for her elderly mother who is in her 64s and demented.  She is not sleeping well.  She has been on mirtazapine 30 mg nightly for some time.  Please see recent prior GI notes for further details but recall that CT scanning of the abdomen and pelvis with contrast in February 2023 did not show any significant problems that would correlate with anorexia or weight loss, hepatomegaly was reported but I think that was Riedel's lobe normal variant. ? ?Wt Readings from Last 3 Encounters:  ?01/15/22 116 lb (52.6 kg)  ?11/20/21 121 lb (54.9 kg)  ?10/26/21 110 lb (49.9 kg)  ?2022 colonoscopy hemorrhoids, decreased sphincter tone,  severe diverticulosis, 5 mm sigmoid polyp-hyperplastic recall 2032 ? ?Allergies  ?Allergen Reactions  ? Fluoxetine Hcl Other (See Comments)  ?  Suicidal   ? Lisinopril Cough  ? Tramadol   ?  Nervous, anxiety  ? Statins   ?  Severe myalgia with Atorvastatin and Rosuvastatin  ? Hydrocodone Nausea Only and Rash  ?  Shaking, nervous  ? ?Current Meds  ?Medication Sig  ? cholestyramine (QUESTRAN) 4 g packet Take 1 packet (4 g total) by mouth daily with supper.  ? clonazePAM (KLONOPIN) 0.5 MG tablet Take 1 mg by mouth at bedtime.  ? dicyclomine (BENTYL) 20 MG tablet Take 1 tablet (20 mg total) by mouth every 6 (six) hours as needed for spasms (abdominal pain).  ? hydrocortisone (ANUSOL-HC) 2.5 % rectal cream Place 1 application. rectally 2 (two) times daily.  ? Lactase (LACTOSE INTOLERANCE PO) Take by mouth. One teaspoon three times daily before meals  ? mirtazapine (REMERON) 30 MG tablet Take 30 mg by mouth at bedtime.  ? valsartan (DIOVAN) 80 MG tablet Take 1 tablet (80 mg total) by mouth daily. (Patient taking differently: Take 40 mg by mouth daily.)  ? ?Past Medical History:  ?Diagnosis Date  ? AIN grade III   ? Anal fistula   ? 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 Anxiety   ? Bruises easily   ? Cervical spinal stenosis 2008  ? Chronic constipation   ? Chronic pain syndrome   ? neck and  lower back  ? COPD (chronic obstructive pulmonary disease) ([place])   ? Depression   ? Family history of adverse reaction to anesthesia   ? mother--  ponv  ? GERD (gastroesophageal reflux disease)   ? History of basal cell carcinoma excision   ? History of prolonged Q-T interval on ECG   ? per cardiologist, dr Fletcher Anon, note in epic-- in the setting of treatment with medications that can cause prolonged QT, went back to normal after stopping the medications (was seen by dr Caryl Comes no further workup recommended)  ? History of staph infection   ? 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  ? Hypertension   ? Insomnia   ? Lesion of ulnar nerve    ? Mild carotid artery disease ([place])   ? per duplex 08-23-2017  bilateral ICA 1-39%  ? OA (osteoarthritis)   ? neck and back  ? Occlusion of left vertebral artery   ? noted 05-28-2017 MRI;  followed by cardiologist, dr Fletcher Anon (per cardiologist has collaterals)  ? Osteoporosis   ? Perirectal abscess 07/08/2018  ? PONV (postoperative nausea and vomiting)   ? sometimes queezy  ? Primary localized osteoarthrosis, pelvic region and thigh   ? Wears dentures   ? upper  ? Wears glasses   ? ?Past Surgical History:  ?Procedure Laterality Date  ? ABDOMINAL HYSTERECTOMY  age 52s  ? CARPAL TUNNEL RELEASE Bilateral 2018  ? CHOLECYSTECTOMY    ? CHOLECYSTECTOMY  10/30/2011  ? Procedure: LAPAROSCOPIC CHOLECYSTECTOMY WITH INTRAOPERATIVE CHOLANGIOGRAM;  Surgeon: Imogene Burn. Georgette Dover, MD;  [facility];  Service: General;  Laterality: N/A;  ? COLONOSCOPY    ? DORSAL COMPARTMENT RELEASE Left 07/20/2015  ? Procedure: RELEASE DORSAL COMPARTMENT (DEQUERVAIN);  Surgeon: Corky Mull, MD;  Location: [place];  Service: Orthopedics;  Laterality: Left;  ? EVALUATION UNDER ANESTHESIA WITH FISTULECTOMY N/A 10/02/2018  ? Procedure: ANAL EXAM UNDER ANESTHESIA;  Surgeon: Leighton Ruff, MD;  Location: [facility];  Service: General;  Laterality: N/A;  ? HEMORRHOID SURGERY  11/25/2006   dr Bubba Camp  '@[facility]'$   ? I & D, ORIF RIGHT RING FINGER  Oct 9th, 15th ,2009  ? near amputation from dog bite  ? INCISION AND DRAINAGE PERIRECTAL ABSCESS N/A 07/08/2018  ? Procedure: IRRIGATION AND DEBRIDEMENT PERIRECTAL ABSCESS;  Surgeon: Benjamine Sprague, DO;  Location: [facility];  Service: General;  Laterality: N/A;  ? INGUINAL LYMPH NODE BIOPSY Right 02/03/2014  ? Procedure: INGUINAL LYMPH NODE BIOPSY;  Surgeon: Zenovia Jarred, MD;  Location: [place];  Service: General;  Laterality: Right;  right inguinal area  ? LAPAROSCOPIC SALPINGOOPHERECTOMY Bilateral age 70s  ? LIGATION OF INTERNAL FISTULA TRACT N/A 04/01/2019  ? Procedure:  LIGATION OF INTERNAL FISTULA TRACT RESECTION OF ANAL LESION;  Surgeon: Leighton Ruff, MD;  Location: [place];  Service: General;  Laterality: N/A;  ? ORIF CLAVICLE FRACTURE Right 2003  ? HARDWARE REMOVAL 08-22-2004 BY DR Veverly Fells '@[facility]'$   ? PLACEMENT OF SETON N/A 10/02/2018  ? Procedure: PLACEMENT OF SETON VS FISTULOTOMY, BIOPSY;  Surgeon: Leighton Ruff, MD;  [facility];  Service: General;  Laterality: N/A;  ? SHOULDER ARTHROSCOPY Left 07/20/2015  ? Procedure: ARTHROSCOPY SHOULDER DEBRIDEMENT AND DECOMPRESSION;  Surgeon: Corky Mull, MD;  Location: [place];  Service: Orthopedics;  Laterality: Left;  ? SHOULDER ARTHROSCOPY Left 01/12/2016  ? Procedure: ARTHROSCOPY SHOULDER WITH DEBRIDEMENT AND EXCISION OF THE DISTAL CLAVICLE;  Surgeon: Corky Mull, MD;  Location:  [facility];  Service: Orthopedics;  Laterality: Left;  ? ?Social History  ? ?Social History Narrative  ? ** Merged History Encounter **  ?    ? Disabled ?2 daughters ?Reports single marital status ?Smoker, 2 caffeinated beverages a day no other tobacco no drug use no alcohol  ? ?family history includes Anxiety disorder in her mother; Cancer in her paternal grandmother; Colon cancer (age of onset: 3) in her father; Coronary artery disease in her mother; Dementia in her mother; Esophageal cancer in her father; Hyperlipidemia in her mother; Hypertension in her mother; Liver disease in her father; Pancreatic cancer in her father; Prostate cancer in her father; Valvular heart disease in her mother. ? ? ?Review of Systems ? ?As above ?Objective:  ? Physical Exam ?BP 114/60   Pulse 83   Ht '5\' 4"'$  (1.626 m)   Wt 116 lb (52.6 kg)   LMP 10/23/2002   BMI 19.91 kg/m?  ? ? ?

## 2022-01-15 NOTE — Patient Instructions (Addendum)
Continue your cholestyramine and adjust as needed. ? ?Dr Carlean Purl has increased your dosage of the mirtazapine. ? ?Try thru out the day to get in at least 2 premier protein drinks and try to eat eggs and whole fat yogurt. ? ? ?I appreciate the opportunity to care for you. ?Silvano Rusk, MD, [facility] ?

## 2022-03-16 DIAGNOSIS — H47323 Drusen of optic disc, bilateral: Secondary | ICD-10-CM | POA: Diagnosis not present

## 2022-03-20 DIAGNOSIS — Z8542 Personal history of malignant neoplasm of other parts of uterus: Secondary | ICD-10-CM | POA: Diagnosis not present

## 2022-03-20 DIAGNOSIS — Z85038 Personal history of other malignant neoplasm of large intestine: Secondary | ICD-10-CM | POA: Diagnosis not present

## 2022-03-20 DIAGNOSIS — F411 Generalized anxiety disorder: Secondary | ICD-10-CM | POA: Diagnosis not present

## 2022-03-20 DIAGNOSIS — J449 Chronic obstructive pulmonary disease, unspecified: Secondary | ICD-10-CM | POA: Diagnosis not present

## 2022-03-20 DIAGNOSIS — Z8673 Personal history of transient ischemic attack (TIA), and cerebral infarction without residual deficits: Secondary | ICD-10-CM | POA: Diagnosis not present

## 2022-03-20 DIAGNOSIS — G4700 Insomnia, unspecified: Secondary | ICD-10-CM | POA: Diagnosis not present

## 2022-03-20 DIAGNOSIS — Z809 Family history of malignant neoplasm, unspecified: Secondary | ICD-10-CM | POA: Diagnosis not present

## 2022-03-20 DIAGNOSIS — K581 Irritable bowel syndrome with constipation: Secondary | ICD-10-CM | POA: Diagnosis not present

## 2022-03-20 DIAGNOSIS — G629 Polyneuropathy, unspecified: Secondary | ICD-10-CM | POA: Diagnosis not present

## 2022-03-20 DIAGNOSIS — F324 Major depressive disorder, single episode, in partial remission: Secondary | ICD-10-CM | POA: Diagnosis not present

## 2022-03-20 DIAGNOSIS — Z89022 Acquired absence of left finger(s): Secondary | ICD-10-CM | POA: Diagnosis not present

## 2022-03-20 DIAGNOSIS — K219 Gastro-esophageal reflux disease without esophagitis: Secondary | ICD-10-CM | POA: Diagnosis not present

## 2022-03-20 DIAGNOSIS — M1990 Unspecified osteoarthritis, unspecified site: Secondary | ICD-10-CM | POA: Diagnosis not present

## 2022-03-20 DIAGNOSIS — Z9181 History of falling: Secondary | ICD-10-CM | POA: Diagnosis not present

## 2022-03-20 DIAGNOSIS — I10 Essential (primary) hypertension: Secondary | ICD-10-CM | POA: Diagnosis not present

## 2022-03-20 DIAGNOSIS — Z818 Family history of other mental and behavioral disorders: Secondary | ICD-10-CM | POA: Diagnosis not present

## 2022-03-20 DIAGNOSIS — Z8249 Family history of ischemic heart disease and other diseases of the circulatory system: Secondary | ICD-10-CM | POA: Diagnosis not present

## 2022-04-25 ENCOUNTER — Other Ambulatory Visit: Payer: Self-pay | Admitting: Internal Medicine

## 2022-04-25 DIAGNOSIS — K6282 Dysplasia of anus: Secondary | ICD-10-CM | POA: Diagnosis not present

## 2022-04-25 DIAGNOSIS — I700 Atherosclerosis of aorta: Secondary | ICD-10-CM | POA: Diagnosis not present

## 2022-04-25 DIAGNOSIS — F5104 Psychophysiologic insomnia: Secondary | ICD-10-CM | POA: Diagnosis not present

## 2022-04-25 DIAGNOSIS — Z8639 Personal history of other endocrine, nutritional and metabolic disease: Secondary | ICD-10-CM | POA: Diagnosis not present

## 2022-04-25 DIAGNOSIS — R0989 Other specified symptoms and signs involving the circulatory and respiratory systems: Secondary | ICD-10-CM | POA: Diagnosis not present

## 2022-04-25 DIAGNOSIS — Z8679 Personal history of other diseases of the circulatory system: Secondary | ICD-10-CM | POA: Diagnosis not present

## 2022-04-25 DIAGNOSIS — R102 Pelvic and perineal pain: Secondary | ICD-10-CM | POA: Diagnosis not present

## 2022-04-25 DIAGNOSIS — S40812A Abrasion of left upper arm, initial encounter: Secondary | ICD-10-CM | POA: Diagnosis not present

## 2022-04-25 DIAGNOSIS — Z0000 Encounter for general adult medical examination without abnormal findings: Secondary | ICD-10-CM | POA: Diagnosis not present

## 2022-05-09 ENCOUNTER — Encounter (HOSPITAL_COMMUNITY): Payer: Self-pay

## 2022-05-09 ENCOUNTER — Ambulatory Visit (HOSPITAL_COMMUNITY)
Admission: RE | Admit: 2022-05-09 | Discharge: 2022-05-09 | Disposition: A | Discharge status: Home / Self Care | Payer: Medicare HMO | Source: Ambulatory Visit | Attending: Family Medicine | Admitting: Family Medicine

## 2022-05-09 ENCOUNTER — Ambulatory Visit (INDEPENDENT_AMBULATORY_CARE_PROVIDER_SITE_OTHER): Payer: Medicare HMO

## 2022-05-09 VITALS — BP 134/92 | HR 76 | Temp 98.7°F | Resp 12 | Ht 63.0 in | Wt 120.0 lb

## 2022-05-09 DIAGNOSIS — Z20822 Contact with and (suspected) exposure to covid-19: Secondary | ICD-10-CM | POA: Insufficient documentation

## 2022-05-09 DIAGNOSIS — R059 Cough, unspecified: Secondary | ICD-10-CM

## 2022-05-09 DIAGNOSIS — R110 Nausea: Secondary | ICD-10-CM | POA: Insufficient documentation

## 2022-05-09 DIAGNOSIS — R0602 Shortness of breath: Secondary | ICD-10-CM

## 2022-05-09 DIAGNOSIS — J069 Acute upper respiratory infection, unspecified: Secondary | ICD-10-CM | POA: Diagnosis not present

## 2022-05-09 DIAGNOSIS — M549 Dorsalgia, unspecified: Secondary | ICD-10-CM | POA: Insufficient documentation

## 2022-05-09 DIAGNOSIS — J029 Acute pharyngitis, unspecified: Secondary | ICD-10-CM | POA: Insufficient documentation

## 2022-05-09 DIAGNOSIS — J449 Chronic obstructive pulmonary disease, unspecified: Secondary | ICD-10-CM | POA: Insufficient documentation

## 2022-05-09 LAB — POCT URINALYSIS DIPSTICK, ED / UC
Bilirubin Urine: NEGATIVE
Glucose, UA: NEGATIVE mg/dL
Hgb urine dipstick: NEGATIVE
Ketones, ur: NEGATIVE mg/dL
Leukocytes,Ua: NEGATIVE
Nitrite: NEGATIVE
Protein, ur: NEGATIVE mg/dL
Specific Gravity, Urine: 1.015 (ref 1.005–1.030)
Urobilinogen, UA: 1 mg/dL (ref 0.0–1.0)
pH: 7 (ref 5.0–8.0)

## 2022-05-09 MED ORDER — LIDOCAINE VISCOUS HCL 2 % MT SOLN: 5.0000 mL | Freq: Four times a day (QID) | OROMUCOSAL | 0 refills | Status: DC | PRN

## 2022-05-09 MED ORDER — PROMETHAZINE-DM 6.25-15 MG/5ML PO SYRP: 5.0000 mL | ORAL_SOLUTION | Freq: Four times a day (QID) | ORAL | 0 refills | Status: DC | PRN

## 2022-05-09 NOTE — Discharge Instructions (Addendum)
Your chest x-ray did not show any signs of pneumonia.

## 2022-05-09 NOTE — ED Triage Notes (Signed)
Pt has a dry cough, nausea, chills, body aches, back pain fatigue loss of appetite , pt denies loss of taste or smell and fever.

## 2022-05-09 NOTE — ED Provider Notes (Signed)
[facility]   [id number] 05/09/22 Arrival Time: 4270  ASSESSMENT & PLAN:  1. Viral URI with cough   2. SOB (shortness of breath)   3. Sore throat    I have personally viewed the imaging studies ordered this visit. No acute changes noted. No signs of PNA. Discussed with patient.  COVID testing sent at pt request. ST likely from post-nasal drainage.  Meds ordered this encounter  Medications   magic mouthwash (lidocaine, diphenhydrAMINE, alum & mag hydroxide) suspension    Sig: Swish and spit 5 mLs 4 (four) times daily as needed for mouth pain.    Dispense:  360 mL    Refill:  0   promethazine-dextromethorphan (PROMETHAZINE-DM) 6.25-15 MG/5ML syrup    Sig: Take 5 mLs by mouth 4 (four) times daily as needed for cough.    Dispense:  118 mL    Refill:  0   Discussed typical duration of symptoms of viral illness OTC symptom care as needed. Ensure adequate fluid intake and rest. May f/u with PCP or here as needed.  Reviewed expectations re: course of current medical issues. Questions answered. Outlined signs and symptoms indicating need for more acute intervention. Patient verbalized understanding. After Visit Summary given.   SUBJECTIVE: History from: patient.  Amanda Bennett is a 66 y.o. female who presents with cough, nausea, chills, body aches, back pain, fatigue; friend with similar symptoms. Afebrile. Does report worsening cough with occas SOB. No CP. OTC treatments without much help. Social History   Tobacco Use  Smoking Status Every Day   Types: Cigarettes  Smokeless Tobacco Never     OBJECTIVE:  Vitals:   05/09/22 1420 05/09/22 1422  BP:  (!) 134/92  Pulse:  76  Resp:  12  Temp:  98.7 F (37.1 C)  TempSrc:  Oral  SpO2:  98%  Weight: 54.4 kg   Height: '5\' 3"'$  (1.6 m)      General appearance: alert; appears fatigued HEENT: nasal congestion; clear runny nose; throat irritation secondary to post-nasal drainage Neck: supple without LAD CV:  RRR Lungs: unlabored respirations, symmetrical air entry without wheezing; cough: mild Abd: soft Ext: no LE edema Skin: warm and dry Psychological: alert and cooperative; normal mood and affect  Imaging: DG Chest 2 View  Result Date: 05/09/2022 CLINICAL DATA:  Shortness of breath, cough EXAM: CHEST - 2 VIEW COMPARISON:  10/26/2021 FINDINGS: Cardiac size is within normal limits. Increase in the AP diameter of chest suggests COPD. Lung fields are clear of any infiltrates or pulmonary edema. There is no pleural effusion or pneumothorax. Deformities are seen in multiple right upper ribs with no interval change suggesting old healed fractures. IMPRESSION: COPD. There are no signs of pulmonary edema or focal pulmonary consolidation. Electronically Signed   By: Elmer Picker M.D.   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   Anxiety    Bruises easily    Cervical spinal stenosis 2008   Chronic constipation    Chronic pain syndrome    neck and lower back  COPD (chronic obstructive pulmonary disease) (HCC)    Depression    Family history of adverse reaction to anesthesia    mother--  ponv   GERD (gastroesophageal reflux disease)    History of basal cell carcinoma excision    History of prolonged Q-T interval on ECG    per cardiologist, dr Fletcher Anon, note in epic-- in the setting of treatment with medications that can cause prolonged QT, went back to normal after stopping the medications (was seen by dr Caryl Comes no further workup recommended)   History of staph infection    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   Hypertension     Insomnia    Lesion of ulnar nerve    Mild carotid artery disease ([place])    per duplex 08-23-2017  bilateral ICA 1-39%   OA (osteoarthritis)    neck and back   Occlusion of left vertebral artery    noted 05-28-2017 MRI;  followed by cardiologist, dr Fletcher Anon (per cardiologist has collaterals)   Osteoporosis    Perirectal abscess 07/08/2018   PONV (postoperative nausea and vomiting)    sometimes queezy   Primary localized osteoarthrosis, pelvic region and thigh    Wears dentures    upper   Wears glasses    Family History  Problem Relation Age of Onset   Anxiety disorder Mother    Hyperlipidemia Mother    Hypertension Mother    Valvular heart disease Mother    Coronary artery disease Mother    Dementia Mother    Prostate cancer Father    Colon cancer Father 86       deceased at age 53 from colon cancer   Pancreatic cancer Father    Esophageal cancer Father    Liver disease Father    Cancer Paternal Grandmother        ovarian   Stroke Neg Hx    Stomach cancer Neg Hx    Social History   Socioeconomic History   Marital status: Single    Spouse name: Not on file   Number of children: Not on file   Years of education: Not on file   Highest education level: Not on file  Occupational History   Not on file  Tobacco Use   Smoking status: Every Day    Types: Cigarettes   Smokeless tobacco: Never  Vaping Use   Vaping Use: Former  Substance and Sexual Activity   Alcohol use: Not Currently   Drug use: No   Sexual activity: Not on file  Other Topics Concern   Not on file  Social History Narrative   ** Merged History Encounter **       Disabled 2 daughters Reports single marital status Smoker, 2 caffeinated beverages a day no other tobacco no drug use no alcohol   Social Determinants of Radio broadcast assistant Strain: Not on file  Food Insecurity: Not on file  Transportation Needs: Not on file  Physical Activity: Not on file  Stress: Not on file  Social  Connections: Not on file  Intimate Partner Violence: Not on file            [place], MD 05/09/22 [phone]

## 2022-05-10 LAB — SARS CORONAVIRUS 2 (TAT 6-24 HRS): SARS Coronavirus 2: NEGATIVE

## 2022-05-21 ENCOUNTER — Ambulatory Visit
Admission: RE | Admit: 2022-05-21 | Discharge: 2022-05-21 | Disposition: A | Discharge status: Home / Self Care | Payer: No Typology Code available for payment source | Source: Ambulatory Visit | Attending: Internal Medicine | Admitting: Internal Medicine

## 2022-05-21 DIAGNOSIS — I700 Atherosclerosis of aorta: Secondary | ICD-10-CM

## 2022-05-30 DIAGNOSIS — R5383 Other fatigue: Secondary | ICD-10-CM | POA: Diagnosis not present

## 2022-05-30 DIAGNOSIS — I2510 Atherosclerotic heart disease of native coronary artery without angina pectoris: Secondary | ICD-10-CM | POA: Diagnosis not present

## 2022-05-30 DIAGNOSIS — F339 Major depressive disorder, recurrent, unspecified: Secondary | ICD-10-CM | POA: Diagnosis not present

## 2022-05-30 DIAGNOSIS — M810 Age-related osteoporosis without current pathological fracture: Secondary | ICD-10-CM | POA: Diagnosis not present

## 2022-05-30 DIAGNOSIS — R1312 Dysphagia, oropharyngeal phase: Secondary | ICD-10-CM | POA: Diagnosis not present

## 2022-07-03 ENCOUNTER — Telehealth: Payer: Self-pay | Admitting: Internal Medicine

## 2022-07-03 NOTE — Telephone Encounter (Signed)
Inbound call from patient stating that she is having rectal pain and went to use the bathroom and the substance that she described was like "orange juice with a pulp". Patient is requesting a call back to discuss. Please advise.

## 2022-07-03 NOTE — Telephone Encounter (Signed)
Patients sister called requesting to speak with a nurse she is urgently seeking advise for the patient is in excruciating pain.

## 2022-07-03 NOTE — Telephone Encounter (Signed)
Pt states that she has been having Left lower abdominal pain along with Lower abdominal pain; achy pain in the anal/rectum area, lots of gas, other day BM appeared such as orange juice with pulp, yesterday BM was pencil like thin: Pt stated that she has been taking lots of Milk of Mag and Miralax:  Last BM was today: Please advise

## 2022-07-03 NOTE — Telephone Encounter (Signed)
Sounds like had IBS flare - constipated and then multiple stools. Is having rectal pain and LLQ pain/tenderness and is concerned she may have a recuurent perirecta abscess.  Advised warm soaks/sitz bath and recticare.  She has held Questran and is using dicyclomine.   Please have her do the following:  1) CBC, CMET  2) CT abd/pelvis w/ contrast dx LLA pain, rectal pain hx perirectal abscess - hopefully this week  I did advise her if things worsened to go to ED

## 2022-07-04 ENCOUNTER — Other Ambulatory Visit (INDEPENDENT_AMBULATORY_CARE_PROVIDER_SITE_OTHER): Payer: Medicare HMO

## 2022-07-04 ENCOUNTER — Other Ambulatory Visit: Payer: Self-pay

## 2022-07-04 DIAGNOSIS — K6289 Other specified diseases of anus and rectum: Secondary | ICD-10-CM

## 2022-07-04 DIAGNOSIS — R1032 Left lower quadrant pain: Secondary | ICD-10-CM

## 2022-07-04 DIAGNOSIS — E875 Hyperkalemia: Secondary | ICD-10-CM

## 2022-07-04 LAB — CBC WITH DIFFERENTIAL/PLATELET
Basophils Absolute: 0 10*3/uL (ref 0.0–0.1)
Basophils Relative: 0.5 % (ref 0.0–3.0)
Eosinophils Absolute: 0.1 10*3/uL (ref 0.0–0.7)
Eosinophils Relative: 1.6 % (ref 0.0–5.0)
HCT: 39 % (ref 36.0–46.0)
Hemoglobin: 13.1 g/dL (ref 12.0–15.0)
Lymphocytes Relative: 18.9 % (ref 12.0–46.0)
Lymphs Abs: 1.2 10*3/uL (ref 0.7–4.0)
MCHC: 33.5 g/dL (ref 30.0–36.0)
MCV: 94.7 fl (ref 78.0–100.0)
Monocytes Absolute: 0.7 10*3/uL (ref 0.1–1.0)
Monocytes Relative: 10.8 % (ref 3.0–12.0)
Neutro Abs: 4.5 10*3/uL (ref 1.4–7.7)
Neutrophils Relative %: 68.2 % (ref 43.0–77.0)
Platelets: 231 10*3/uL (ref 150.0–400.0)
RBC: 4.12 Mil/uL (ref 3.87–5.11)
RDW: 13.4 % (ref 11.5–15.5)
WBC: 6.5 10*3/uL (ref 4.0–10.5)

## 2022-07-04 LAB — COMPREHENSIVE METABOLIC PANEL
ALT: 14 U/L (ref 0–35)
AST: 20 U/L (ref 0–37)
Albumin: 4.1 g/dL (ref 3.5–5.2)
Alkaline Phosphatase: 79 U/L (ref 39–117)
BUN: 8 mg/dL (ref 6–23)
CO2: 30 mEq/L (ref 19–32)
Calcium: 9.2 mg/dL (ref 8.4–10.5)
Chloride: 104 mEq/L (ref 96–112)
Creatinine, Ser: 0.7 mg/dL (ref 0.40–1.20)
GFR: 91.41 mL/min (ref >60.00)
Glucose, Bld: 92 mg/dL (ref 70–99)
Potassium: 4.6 mEq/L (ref 3.5–5.1)
Sodium: 139 mEq/L (ref 135–145)
Total Bilirubin: 0.3 mg/dL (ref 0.2–1.2)
Total Protein: 6.9 g/dL (ref 6.0–8.3)

## 2022-07-04 NOTE — Telephone Encounter (Signed)
Pt notified of Dr. Carlean Purl recommendations:  Orders for labs placed in Epic: Pt made aware: Location to lab given: CT abd/pelvis w/ contrast dx LLA pain, rectal pain hx perirectal abscess scheduled for first available: 07/09/2022 at [facility] at 4:15 PM   Pt to arrive at 2:15 PM to start drinking oral contrast: Nothing to eat or drink 4 hours prior: 12:15 PM Pt made aware: Pt verbalized understanding with all questions answered.

## 2022-07-06 ENCOUNTER — Emergency Department (HOSPITAL_BASED_OUTPATIENT_CLINIC_OR_DEPARTMENT_OTHER): Payer: Medicare HMO

## 2022-07-06 ENCOUNTER — Emergency Department (HOSPITAL_BASED_OUTPATIENT_CLINIC_OR_DEPARTMENT_OTHER)
Admission: EM | Admit: 2022-07-06 | Discharge: 2022-07-06 | Disposition: A | Discharge status: Home / Self Care | Payer: Medicare HMO | Attending: Emergency Medicine | Admitting: Emergency Medicine

## 2022-07-06 DIAGNOSIS — Z79899 Other long term (current) drug therapy: Secondary | ICD-10-CM | POA: Insufficient documentation

## 2022-07-06 DIAGNOSIS — I10 Essential (primary) hypertension: Secondary | ICD-10-CM | POA: Diagnosis not present

## 2022-07-06 DIAGNOSIS — J449 Chronic obstructive pulmonary disease, unspecified: Secondary | ICD-10-CM | POA: Diagnosis not present

## 2022-07-06 DIAGNOSIS — R109 Unspecified abdominal pain: Secondary | ICD-10-CM | POA: Diagnosis not present

## 2022-07-06 DIAGNOSIS — R194 Change in bowel habit: Secondary | ICD-10-CM | POA: Diagnosis present

## 2022-07-06 DIAGNOSIS — K6289 Other specified diseases of anus and rectum: Secondary | ICD-10-CM | POA: Diagnosis not present

## 2022-07-06 DIAGNOSIS — Z85828 Personal history of other malignant neoplasm of skin: Secondary | ICD-10-CM | POA: Insufficient documentation

## 2022-07-06 DIAGNOSIS — I700 Atherosclerosis of aorta: Secondary | ICD-10-CM | POA: Diagnosis not present

## 2022-07-06 DIAGNOSIS — R1084 Generalized abdominal pain: Secondary | ICD-10-CM | POA: Insufficient documentation

## 2022-07-06 LAB — COMPREHENSIVE METABOLIC PANEL
ALT: 16 U/L (ref 0–44)
AST: 20 U/L (ref 15–41)
Albumin: 4.4 g/dL (ref 3.5–5.0)
Alkaline Phosphatase: 80 U/L (ref 38–126)
Anion gap: 7 (ref 5–15)
BUN: 8 mg/dL (ref 8–23)
CO2: 29 mmol/L (ref 22–32)
Calcium: 9.3 mg/dL (ref 8.9–10.3)
Chloride: 104 mmol/L (ref 98–111)
Creatinine, Ser: 0.71 mg/dL (ref 0.44–1.00)
GFR, Estimated: >60 mL/min (ref >60)
Glucose, Bld: 85 mg/dL (ref 70–99)
Potassium: 4 mmol/L (ref 3.5–5.1)
Sodium: 140 mmol/L (ref 135–145)
Total Bilirubin: 0.3 mg/dL (ref 0.3–1.2)
Total Protein: 7.2 g/dL (ref 6.5–8.1)

## 2022-07-06 LAB — CBC WITH DIFFERENTIAL/PLATELET
Abs Immature Granulocytes: 0.05 10*3/uL (ref 0.00–0.07)
Basophils Absolute: 0 10*3/uL (ref 0.0–0.1)
Basophils Relative: 1 %
Eosinophils Absolute: 0.2 10*3/uL (ref 0.0–0.5)
Eosinophils Relative: 3 %
HCT: 38.6 % (ref 36.0–46.0)
Hemoglobin: 13 g/dL (ref 12.0–15.0)
Immature Granulocytes: 1 %
Lymphocytes Relative: 23 %
Lymphs Abs: 1.6 10*3/uL (ref 0.7–4.0)
MCH: 31.9 pg (ref 26.0–34.0)
MCHC: 33.7 g/dL (ref 30.0–36.0)
MCV: 94.8 fL (ref 80.0–100.0)
Monocytes Absolute: 0.7 10*3/uL (ref 0.1–1.0)
Monocytes Relative: 10 %
Neutro Abs: 4.3 10*3/uL (ref 1.7–7.7)
Neutrophils Relative %: 62 %
Platelets: 240 10*3/uL (ref 150–400)
RBC: 4.07 MIL/uL (ref 3.87–5.11)
RDW: 13.2 % (ref 11.5–15.5)
WBC: 6.8 10*3/uL (ref 4.0–10.5)
nRBC: 0 % (ref 0.0–0.2)

## 2022-07-06 LAB — URINALYSIS, ROUTINE W REFLEX MICROSCOPIC
Bilirubin Urine: NEGATIVE
Glucose, UA: NEGATIVE mg/dL
Hgb urine dipstick: NEGATIVE
Ketones, ur: NEGATIVE mg/dL
Leukocytes,Ua: NEGATIVE
Nitrite: NEGATIVE
Protein, ur: NEGATIVE mg/dL
Specific Gravity, Urine: <1.005 — ABNORMAL LOW (ref 1.005–1.030)
pH: 7 (ref 5.0–8.0)

## 2022-07-06 LAB — LIPASE, BLOOD: Lipase: 40 U/L (ref 11–51)

## 2022-07-06 MED ORDER — DICYCLOMINE HCL 20 MG PO TABS: 20.0000 mg | ORAL_TABLET | Freq: Two times a day (BID) | ORAL | 0 refills | Status: DC

## 2022-07-06 MED ORDER — ONDANSETRON 4 MG PO TBDP: 4.0000 mg | ORAL_TABLET | Freq: Three times a day (TID) | ORAL | 0 refills | Status: DC | PRN

## 2022-07-06 MED ORDER — LACTATED RINGERS IV BOLUS
1000.0000 mL | Freq: Once | INTRAVENOUS | Status: AC
  Administered 2022-07-06: 1000 mL via INTRAVENOUS

## 2022-07-06 MED ORDER — ONDANSETRON HCL 4 MG/2ML IJ SOLN
4.0000 mg | Freq: Once | INTRAMUSCULAR | Status: AC
  Administered 2022-07-06: 4 mg via INTRAVENOUS
  Filled 2022-07-06: qty 2

## 2022-07-06 MED ORDER — IOHEXOL 300 MG/ML  SOLN
100.0000 mL | Freq: Once | INTRAMUSCULAR | Status: AC | PRN
  Administered 2022-07-06: 100 mL via INTRAVENOUS

## 2022-07-06 MED ORDER — MORPHINE SULFATE (PF) 4 MG/ML IV SOLN
4.0000 mg | Freq: Once | INTRAVENOUS | Status: AC
  Administered 2022-07-06: 4 mg via INTRAVENOUS
  Filled 2022-07-06: qty 1

## 2022-07-06 MED ORDER — HYDROMORPHONE HCL 1 MG/ML IJ SOLN
1.0000 mg | Freq: Once | INTRAMUSCULAR | Status: AC
  Administered 2022-07-06: 1 mg via INTRAVENOUS
  Filled 2022-07-06: qty 1

## 2022-07-06 NOTE — ED Notes (Signed)
At bedside w/ the PA for patient exam as a chaperone.

## 2022-07-06 NOTE — ED Provider Notes (Cosign Needed Addendum)
[place] EMERGENC[facility] DEPT Provider Note   CSN: [id number] Arrival date & time: 07/06/22  1152     History  Chief Complaint  Patient presents with   Rectal Pain    Amanda Bennett is a 66 y.o. female.  Patient with history of diverticulitis presents today for evaluation of rectal pain, change in bowel habit over the past week and a half.  She was scheduled to have a CT abdomen pelvis done by her gastroenterologist which has been scheduled for Monday however she states the pain worsened and she could not wait.  She denies emesis, blood in her stool.  She states her bowel movements or thin which is unusual.  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  She denies fever, chills.  The history is provided by the patient. No language interpreter was used.       Home Medications Prior to Admission medications   Medication Sig Start Date End Date Taking? Authorizing Provider  cholestyramine (QUESTRAN) 4 g packet Take 1 packet (4 g total) by mouth daily with supper. 11/20/21   Gatha Mayer, MD  clonazePAM (KLONOPIN) 0.5 MG tablet Take 1 mg by mouth at bedtime. 11/08/15   [provider]  dicyclomine (BENTYL) 20 MG tablet Take 1 tablet (20 mg total) by mouth every 6 (six) hours as needed for spasms (abdominal pain). 11/20/21   Gatha Mayer, MD  hydrocortisone (ANUSOL-HC) 2.5 % rectal cream Place 1 application. rectally 2 (two) times daily. 11/20/21   Gatha Mayer, MD  Lactase (LACTOSE INTOLERANCE PO) Take by mouth. One teaspoon three times daily before meals    [provider]  magic mouthwash (lidocaine, diphenhydrAMINE, alum & mag hydroxide) suspension Swish and spit 5 mLs 4 (four) times daily as needed for mouth pain. 05/09/22   Vanessa Kick, MD  mirtazapine (REMERON) 45 MG tablet Take 1 tablet (45 mg total) by mouth at bedtime. 01/15/22   Gatha Mayer, MD  promethazine-dextromethorphan (PROMETHAZINE-DM) 6.25-15 MG/5ML syrup Take 5 mLs by mouth 4  (four) times daily as needed for cough. 05/09/22   Vanessa Kick, MD  valsartan (DIOVAN) 80 MG tablet Take 1 tablet (80 mg total) by mouth daily. Patient taking differently: Take 40 mg by mouth daily. 07/19/21   Loel Dubonnet, NP      Allergies    Fluoxetine hcl, Lisinopril, Tramadol, Statins, and Hydrocodone    Review of Systems   Review of Systems  Constitutional:  Negative for chills and fever.  Gastrointestinal:  Positive for abdominal pain and constipation. Negative for nausea and vomiting.  Genitourinary:  Negative for dysuria.  Neurological:  Negative for light-headedness.  All other systems reviewed and are negative.   Physical Exam Updated Vital Signs BP (!) 121/91   Pulse 79   Temp 98.3 F (36.8 C)   Resp 18   Ht '5\' 4"'$  (1.626 m)   Wt 54.4 kg   LMP 10/23/2002   SpO2 98%   BMI 20.60 kg/m  Physical Exam Vitals and nursing note reviewed. Exam conducted with a chaperone present (Respiratory therapist present as chaperone.).  Constitutional:      General: She is not in acute distress.    Appearance: Normal appearance. She is not ill-appearing.  HENT:     Head: Normocephalic and atraumatic.     Nose: Nose normal.  Eyes:     Conjunctiva/sclera: Conjunctivae normal.  Cardiovascular:     Rate and Rhythm: Normal rate and regular rhythm.  Pulses: Normal pulses.  Pulmonary:     Effort: Pulmonary effort is normal. No respiratory distress.  Abdominal:     General: There is no distension.     Tenderness: There is abdominal tenderness. There is guarding. There is no rebound.  Genitourinary:    Comments: Patient complains of rectal pain at about 2:00.  No masses, lesions, or concerns noted in this area or otherwise on exam.  Internal exam deferred.  Musculoskeletal:        General: No deformity. Normal range of motion.     Cervical back: Normal range of motion.  Skin:    Findings: No rash.  Neurological:     Mental Status: She is alert and oriented to person,  place, and time.     ED Results / Procedures / Treatments   Labs (all labs ordered are listed, but only abnormal results are displayed) Labs Reviewed  CBC WITH DIFFERENTIAL/PLATELET  COMPREHENSIVE METABOLIC PANEL  LIPASE, BLOOD  URINALYSIS, ROUTINE W REFLEX MICROSCOPIC    EKG None  Radiology No results found.  Procedures Procedures    Medications Ordered in ED Medications  ondansetron (ZOFRAN) injection 4 mg (has no administration in time range)  morphine (PF) 4 MG/ML injection 4 mg (has no administration in time range)  lactated ringers bolus 1,000 mL (has no administration in time range)    ED Course/ Medical Decision Making/ A&P                           Medical Decision Making Amount and/or Complexity of Data Reviewed Labs: ordered. Radiology: ordered.  Risk Prescription drug management.   Medical Decision Making / ED Course   This patient presents to the ED for concern of abdominal pain, this involves an extensive number of treatment options, and is a complaint that carries with it a high risk of complications and morbidity.  The differential diagnosis includes diverticulitis, rectal abscess, rectal fistula, appendicitis  MDM: 66 year old female presents today for evaluation of abdominal pain.  She has a history of diverticulitis.  She is concerned regarding a fistula.  She has an outpatient CT abdomen pelvis with contrast scheduled for Monday states pain was worse today so she could not wait.  She follows with Dr. Carlean Purl gastroenterologist.  She is overall well-appearing without acute distress.  Appears anxious on exam.  Generalized tenderness to palpation on exam.  Will obtain blood work, CT abdomen pelvis and provide pain control. CBC is unremarkable.  CMP is unremarkable.  Lipase within normal limits.  UA without evidence of UTI.  Reports improvement in pain after dose of morphine, and Dilaudid.  CT abdomen pelvis with contrast shows no evidence of acute  intra-abdominal or pelvic process particularly no fistula.  No perirectal abscess.  Does mention diverticulosis but without diverticulitis.  Rectal exam without concern for perirectal abscess.  Internal exam deferred given patient without complaint of GI bleed, melanotic stools. results discussed with patient.  Discussed follow-up with Dr. Carlean Purl.  Return precautions discussed.  Patient voices understanding and is in agreement with plan.   Additional history obtained: -Additional history obtained from recent telephone visit with Dr. Carlean Purl  -External records from outside source obtained and reviewed including: Chart review including previous notes, labs, imaging, consultation notes   Lab Tests: -I ordered, reviewed, and interpreted labs.   The pertinent results include:   Labs Reviewed  CBC WITH DIFFERENTIAL/PLATELET  COMPREHENSIVE METABOLIC PANEL  LIPASE, BLOOD  URINALYSIS, ROUTINE W REFLEX  MICROSCOPIC      EKG  EKG Interpretation  Date/Time:    Ventricular Rate:    PR Interval:    QRS Duration:   QT Interval:    QTC Calculation:   R Axis:     Text Interpretation:           Imaging Studies ordered: I ordered imaging studies including CT abdomen pelvis with contrast I independently visualized and interpreted imaging. 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   Anxiety    Bruises easily     Cervical spinal stenosis 2008   Chronic constipation    Chronic pain syndrome    neck and lower back   COPD (chronic obstructive pulmonary disease) ([place])    Depression    Family history of adverse reaction to anesthesia    mother--  ponv   GERD (gastroesophageal reflux disease)    History of basal cell carcinoma excision    History of prolonged Q-T interval on ECG    per cardiologist, dr Fletcher Anon, note in epic-- in the setting of treatment with medications that can cause prolonged QT, went back to normal after stopping the medications (was seen by dr Caryl Comes no further workup recommended)   History of staph infection    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  Discharged in stable condition.  Return precautions discussed.  No acute findings noted on CT scan.  Pain improved.  Final Clinical Impression(s) / ED Diagnoses Final diagnoses:  Generalized abdominal pain    Rx / DC Orders ED Discharge Orders          Ordered    ondansetron (ZOFRAN-ODT) 4 MG disintegrating tablet  Every 8 hours PRN        07/06/22 1637    dicyclomine (BENTYL) 20 MG tablet  2 times daily        07/06/22 1637              Evlyn Courier, PA-C 07/06/22 1638    Evlyn Courier, PA-C 07/06/22 1640    Gareth Morgan, MD 07/10/22 1025

## 2022-07-06 NOTE — ED Triage Notes (Signed)
Pt to ED c/o rectal pain, history of fistula, reports scheduled for a CT scan to evaluate.

## 2022-07-06 NOTE — Discharge Instructions (Addendum)
Your work-up today was reassuring.  No concerning cause of your abdominal pain.  Particularly no fistula, diverticulitis, or perirectal abscess.  I have attached the results to your CT scan below for your review.  I recommend that you follow-up with Dr. Arelia Longest.  Please call to let him know that you are here so he can review your CT scan as well.  I have sent Zofran, and Bentyl into the pharmacy for your symptom management.  You can take Tylenol and Motrin in addition.  CT results: IMPRESSION:  1. No acute abdominal or pelvic pathology.  2. Diverticulosis without evidence of diverticulitis.  3.  Aortic Atherosclerosis (ICD10-I70.0).

## 2022-07-09 ENCOUNTER — Ambulatory Visit (HOSPITAL_COMMUNITY): Payer: Medicare HMO | Attending: Internal Medicine

## 2022-07-09 ENCOUNTER — Encounter (HOSPITAL_COMMUNITY): Payer: Self-pay

## 2022-07-17 DIAGNOSIS — Z23 Encounter for immunization: Secondary | ICD-10-CM | POA: Diagnosis not present

## 2022-07-17 DIAGNOSIS — R1312 Dysphagia, oropharyngeal phase: Secondary | ICD-10-CM | POA: Diagnosis not present

## 2022-07-17 DIAGNOSIS — I2510 Atherosclerotic heart disease of native coronary artery without angina pectoris: Secondary | ICD-10-CM | POA: Diagnosis not present

## 2022-07-17 DIAGNOSIS — M810 Age-related osteoporosis without current pathological fracture: Secondary | ICD-10-CM | POA: Diagnosis not present

## 2022-07-17 DIAGNOSIS — F339 Major depressive disorder, recurrent, unspecified: Secondary | ICD-10-CM | POA: Diagnosis not present

## 2022-10-02 DIAGNOSIS — M810 Age-related osteoporosis without current pathological fracture: Secondary | ICD-10-CM | POA: Diagnosis not present

## 2023-01-04 DIAGNOSIS — M7582 Other shoulder lesions, left shoulder: Secondary | ICD-10-CM | POA: Diagnosis not present

## 2023-01-04 DIAGNOSIS — M19012 Primary osteoarthritis, left shoulder: Secondary | ICD-10-CM | POA: Diagnosis not present

## 2023-01-04 DIAGNOSIS — M25512 Pain in left shoulder: Secondary | ICD-10-CM | POA: Diagnosis not present

## 2023-01-21 ENCOUNTER — Other Ambulatory Visit: Payer: Self-pay | Admitting: Surgery

## 2023-01-21 DIAGNOSIS — M7582 Other shoulder lesions, left shoulder: Secondary | ICD-10-CM

## 2023-01-22 ENCOUNTER — Encounter: Payer: Self-pay | Admitting: Surgery

## 2023-01-26 ENCOUNTER — Other Ambulatory Visit: Payer: Medicare HMO

## 2023-02-03 ENCOUNTER — Other Ambulatory Visit: Payer: Self-pay | Admitting: Internal Medicine

## 2023-03-10 ENCOUNTER — Other Ambulatory Visit: Payer: Medicare HMO

## 2023-03-12 ENCOUNTER — Ambulatory Visit: Payer: Medicare HMO | Attending: Cardiovascular Disease | Admitting: Cardiovascular Disease

## 2023-03-12 ENCOUNTER — Ambulatory Visit (INDEPENDENT_AMBULATORY_CARE_PROVIDER_SITE_OTHER): Payer: Medicare HMO

## 2023-03-12 ENCOUNTER — Encounter: Payer: Self-pay | Admitting: Cardiovascular Disease

## 2023-03-12 VITALS — BP 144/88 | HR 78 | Ht 64.0 in | Wt 126.6 lb

## 2023-03-12 DIAGNOSIS — I739 Peripheral vascular disease, unspecified: Secondary | ICD-10-CM | POA: Diagnosis not present

## 2023-03-12 DIAGNOSIS — R0602 Shortness of breath: Secondary | ICD-10-CM | POA: Diagnosis not present

## 2023-03-12 DIAGNOSIS — R002 Palpitations: Secondary | ICD-10-CM

## 2023-03-12 DIAGNOSIS — R079 Chest pain, unspecified: Secondary | ICD-10-CM | POA: Diagnosis not present

## 2023-03-12 DIAGNOSIS — R0989 Other specified symptoms and signs involving the circulatory and respiratory systems: Secondary | ICD-10-CM | POA: Diagnosis not present

## 2023-03-12 NOTE — Progress Notes (Unsigned)
Enrolled patient for a 14 day Zio XT  monitor to be mailed to patients home  °

## 2023-03-12 NOTE — Progress Notes (Signed)
Cardiology Office Note   Date:  03/12/2023   ID:  Amanda Bennett, DOB December 27, 1955, MRN [id number]  PCP:  Merri Brunette, MD  Cardiologist:   Lorine Bears, MD   No chief complaint on file.     History of Present Illness: Amanda Bennett is a 67 y.o. female who presents for a follow up visit regarding hypertension, hyperlipidemia and previously prolonged QT interval.  She has history of prior tobacco use, hypertension, anxiety and hepatitis C. she quit smoking in 2016. She has family history of prolonged QT syndrome and she did have mildly prolonged QT on EKG in the setting of treatment with medications that can cause that. QT went back to normal after stopping the medications. She was seen by Dr. Graciela Husbands and no further workup was recommended. Nuclear stress test in 2015 was normal. Previous CT scan did show evidence of coronary atherosclerosis. Echocardiogram in May 2018 showed normal LV systolic function and atrial septal aneurysm.  She has known history of hyperlipidemia and did not tolerate treatment with atorvastatin or rosuvastatin due to myalgia.  She is tolerating Zetia.  She had an MRI of cervical spine which showed occluded left vertebral artery.  She has no prior history of stroke.  She was more recently seen in our office in 2022.  She complained of increased lower extremity edema, chest pain and increased palpitations. She underwent cardiac CTA in November 2022 which showed a calcium score of 0 with no evidence of obstructive disease.  Echocardiogram at that time showed normal LV systolic function with only mild mitral valve prolapse.  She reports recent episodes of transient vision loss with blurred vision and dizziness.  These episodes last for about 20 to 30 seconds.  In addition, she had increased palpitations with dizziness.  She had a syncopal episode 3 months ago in the sitting position.  She complains of increased shortness of breath but no chest pain.  In addition, she  complains of bilateral leg pain at night and with exertion.  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   Anxiety    Bruises easily    Cervical spinal stenosis 2008   Chronic constipation    Chronic pain syndrome    neck and lower back   COPD (chronic obstructive pulmonary disease) (HCC)    Depression    Family history of adverse reaction to anesthesia    mother--  ponv   GERD (gastroesophageal reflux disease)    History of basal cell carcinoma excision    History of prolonged Q-T interval on ECG    per cardiologist, dr Kirke Corin, note in epic-- in the setting of treatment with medications that can cause prolonged QT, went back to normal after stopping the medications (was seen by dr Graciela Husbands no further workup recommended)   History of staph infection    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   Hypertension    Insomnia    Lesion of ulnar nerve    Mild carotid artery disease (HCC)    per duplex 08-23-2017  bilateral ICA 1-39%   OA (osteoarthritis)    neck and back   Occlusion of left vertebral artery    noted 05-28-2017 MRI;  followed by cardiologist, dr Kirke Corin (per cardiologist has collaterals)   Osteoporosis    Perirectal abscess 07/08/2018   PONV (postoperative nausea and vomiting)    sometimes queezy   Primary localized  osteoarthrosis, pelvic region and thigh    Wears dentures    upper   Wears glasses     Past Surgical History:  Procedure Laterality Date   ABDOMINAL HYSTERECTOMY  age 75s   CARPAL TUNNEL RELEASE Bilateral 2018   CHOLECYSTECTOMY     CHOLECYSTECTOMY  10/30/2011   Procedure: LAPAROSCOPIC CHOLECYSTECTOMY WITH INTRAOPERATIVE CHOLANGIOGRAM;  Surgeon: Wilmon Arms. Corliss Skains, MD;  Location: [facility];  Service: General;  Laterality: N/A;   COLONOSCOPY     DORSAL COMPARTMENT RELEASE Left 07/20/2015   Procedure: RELEASE DORSAL COMPARTMENT (DEQUERVAIN);  Surgeon: Christena Flake, MD;  [facility]: [facility];  Service: Orthopedics;  Laterality: Left;   EVALUATION UNDER ANESTHESIA WITH FISTULECTOMY N/A 10/02/2018   Procedure: ANAL EXAM UNDER ANESTHESIA;  Surgeon: Romie Levee, MD;  Location: [facility];  Service: General;  Laterality: N/A;   HEMORRHOID SURGERY  11/25/2006   dr Lurene Shadow  @[facility]    I & D, ORIF RIGHT RING FINGER  Oct 9th, 15th ,2009   near amputation from dog bite   INCISION AND DRAINAGE PERIRECTAL ABSCESS N/A 07/08/2018   Procedure: IRRIGATION AND DEBRIDEMENT PERIRECTAL ABSCESS;  Surgeon: Sung Amabile, DO;  Location: [facility];  Service: General;  Laterality: N/A;   INGUINAL LYMPH NODE BIOPSY Right 02/03/2014   Procedure: INGUINAL LYMPH NODE BIOPSY;  Surgeon: Liz Malady, MD;  Location: [facility];  Service: General;  Laterality: Right;  right inguinal area   LAPAROSCOPIC SALPINGOOPHERECTOMY Bilateral age 76s   LIGATION OF INTERNAL FISTULA TRACT N/A 04/01/2019   Procedure: LIGATION OF INTERNAL FISTULA TRACT RESECTION OF ANAL LESION;  Surgeon: Romie Levee, MD;  Location: [facility];  Service: General;  Laterality: N/A;   ORIF CLAVICLE FRACTURE Right 2003   HARDWARE REMOVAL 08-22-2004 BY DR Ranell Patrick @[facility]    PLACEMENT OF SETON N/A 10/02/2018   Procedure: PLACEMENT OF SETON VS FISTULOTOMY, BIOPSY;  Surgeon: Romie Levee, MD;  Location: [facility] ;  Service: General;  Laterality: N/A;   SHOULDER ARTHROSCOPY Left 07/20/2015   Procedure: ARTHROSCOPY SHOULDER DEBRIDEMENT AND DECOMPRESSION;  Surgeon: Christena Flake, MD;  Location: [facility];  Service: Orthopedics;  Laterality: Left;   SHOULDER ARTHROSCOPY Left 01/12/2016   Procedure: ARTHROSCOPY SHOULDER WITH DEBRIDEMENT AND EXCISION OF THE DISTAL CLAVICLE;  Surgeon: Christena Flake, MD;  Location: [facility];  Service: Orthopedics;  Laterality: Left;     Current Outpatient Medications  Medication Sig Dispense Refill   cholestyramine (QUESTRAN) 4 g packet Take 1 packet (4  g total) by mouth daily with supper. 90 each 0   dicyclomine (BENTYL) 20 MG tablet Take 1 tablet (20 mg total) by mouth 2 (two) times daily. 20 tablet 0   hydrocortisone (ANUSOL-HC) 2.5 % rectal cream Place 1 application. rectally 2 (two) times daily. 30 g 0   Lactase (LACTOSE INTOLERANCE PO) Take by mouth. One teaspoon three times daily before meals     magic mouthwash (lidocaine, diphenhydrAMINE, alum & mag hydroxide) suspension Swish and spit 5 mLs 4 (four) times daily as needed for mouth pain. 360 mL 0   mirtazapine (REMERON) 45 MG tablet TAKE 1 TABLET BY MOUTH AT BEDTIME. 90 tablet 3   ondansetron (ZOFRAN-ODT) 4 MG disintegrating tablet Take 1 tablet (4 mg total) by mouth every 8 (eight) hours as needed for nausea or vomiting. 20 tablet 0   promethazine-dextromethorphan (PROMETHAZINE-DM) 6.25-15 MG/5ML syrup Take 5 mLs by mouth 4 (four) times daily as needed for cough. 118 mL 0  valsartan (DIOVAN) 80 MG tablet Take 1 tablet (80 mg total) by mouth daily. (Patient taking differently: Take 40 mg by mouth daily.) 90 tablet 3   clonazePAM (KLONOPIN) 0.5 MG tablet Take 1 mg by mouth at bedtime. (Patient not taking: Reported on 03/12/2023)     No current facility-administered medications for this visit.    Allergies:   Fluoxetine hcl, Lisinopril, Tramadol, Statins, and Hydrocodone    Social History:  The patient  reports that she has been smoking cigarettes. She has never used smokeless tobacco. She reports that she does not currently use alcohol. She reports that she does not use drugs.   Family History:  The patient's family history includes Anxiety disorder in her mother; Cancer in her paternal grandmother; Colon cancer (age of onset: 81) in her father; Coronary artery disease in her mother; Dementia in her mother; Esophageal cancer in her father; Hyperlipidemia in her mother; Hypertension in her mother; Liver disease in her father; Pancreatic cancer in her father; Prostate cancer in her father;  Valvular heart disease in her mother.    ROS:  Please see the history of present illness.   Otherwise, review of systems are positive for none.   All other systems are reviewed and negative.    PHYSICAL EXAM: VS:  BP (!) 144/88   Pulse 78   Ht 5\' 4"  (1.626 m)   Wt 126 lb 9.6 oz (57.4 kg)   LMP 10/23/2002   SpO2 98%   BMI 21.73 kg/m  , BMI Body mass index is 21.73 kg/m. GEN: Well nourished, well developed, in no acute distress  HEENT: normal  Neck: no JVD, or masses.  Right carotid bruit Cardiac: RRR; no murmurs, rubs, or gallops,no edema  Respiratory:  clear to auscultation bilaterally, normal work of breathing GI: soft, nontender, nondistended, + BS MS: no deformity or atrophy  Skin: warm and dry, no rash Neuro:  Strength and sensation are intact Psych: euthymic mood, full affect Vascular: Femoral pulses are mildly diminished and her distal pulses are also mildly diminished.  EKG:  EKG is  ordered today. EKG showed Normal sinus rhythm Normal ECG When compared with ECG of 26-Oct-2021 13:26, PREVIOUS ECG IS PRESENT    Recent Labs: 07/06/2022: ALT 16; BUN 8; Creatinine, Ser 0.71; Hemoglobin 13.0; Platelets 240; Potassium 4.0; Sodium 140    Lipid Panel    Component Value Date/Time   CHOL 281 (H) 11/20/2016 1148   TRIG 234 (H) 11/20/2016 1148   HDL 60 11/20/2016 1148   CHOLHDL 4.7 11/20/2016 1148   VLDL 47 (H) 11/20/2016 1148   LDLCALC 174 (H) 11/20/2016 1148   LDLDIRECT 136 (H) 04/08/2012 1125      Wt Readings from Last 3 Encounters:  03/12/23 126 lb 9.6 oz (57.4 kg)  07/06/22 120 lb (54.4 kg)  05/09/22 120 lb (54.4 kg)        ASSESSMENT AND PLAN:  1.  Possible amaurosis fugax: She has right carotid bruit.  I asked her to continue aspirin 81 mg once daily.  I requested carotid Doppler.  2.  Dizziness and palpitations: I requested a 2 weeks ZIO monitor to exclude arrhythmia.  Her EKG today is unremarkable.  3.  Worsening shortness of breath: Previous  cardiac CTA showed no significant coronary artery disease.  I requested a follow-up echocardiogram.  4.  Bilateral leg pain with abnormal pulses: Possible claudication.  I requested lower extremity arterial Doppler.  5.  Essential hypertension: Blood pressure is mildly elevated.  Continue valsartan  for now.  6.  Hyperlipidemia with intolerance to atorvastatin and rosuvastatin due to severe myalgia.  Currently not on medications.   Disposition:   FU with me in 12 months  Signed,  Lorine Bears, MD  03/12/2023 5:48 PM    [facility]

## 2023-03-12 NOTE — Patient Instructions (Signed)
Medication Instructions:  No changes *If you need a refill on your cardiac medications before your next appointment, please call your pharmacy*   Lab Work: None ordered If you have labs (blood work) drawn today and your tests are completely normal, you will receive your results only by: MyChart Message (if you have MyChart) OR A paper copy in the mail If you have any lab test that is abnormal or we need to change your treatment, we will call you to review the results.   Testing/Procedures: Your physician has requested that you have an echocardiogram. Echocardiography is a painless test that uses sound waves to create images of your heart. It provides your doctor with information about the size and shape of your heart and how well your heart's chambers and valves are working. You may receive an ultrasound enhancing agent through an IV if needed to better visualize your heart during the echo.This procedure takes approximately one hour. There are no restrictions for this procedure. This will take place at the [address]   Your physician has requested that you have a lower extremity segmental doppler. This will take place at [address], [address].  Your physician has requested that you have a carotid duplex. This test is an ultrasound of the carotid arteries in your neck. It looks at blood flow through these arteries that supply the brain with blood. Allow one hour for this exam. There are no restrictions or special instructions. This will take place at [address] [facility], [address].    Follow-Up: At [facility], you and your health needs are our priority.  As part of our continuing mission to provide you with exceptional heart care, we have created designated Provider Care Teams.  These Care Teams include your primary Cardiologist (physician) and Advanced Practice Providers (APPs -  Physician Assistants and Nurse Practitioners) who all work together to provide  you with the care you need, when you need it.  We recommend signing up for the patient portal called "MyChart".  Sign up information is provided on this After Visit Summary.  MyChart is used to connect with patients for Virtual Visits (Telemedicine).  Patients are able to view lab/test results, encounter notes, upcoming appointments, etc.  Non-urgent messages can be sent to your provider as well.   To learn more about what you can do with MyChart, go to [email or web address].    Your next appointment:   1 month(s)  Provider:   Lorine Bears, MD     Other Instructions Christena Deem- Long Term Monitor Instructions  Your physician has requested you wear a ZIO patch monitor for 14 days.  This is a single patch monitor. [employer] supplies one patch monitor per enrollment. Additional stickers are not available. Please do not apply patch if you will be having a Nuclear Stress Test,  Echocardiogram, Cardiac CT, MRI, or Chest Xray during the period you would be wearing the  monitor. The patch cannot be worn during these tests. You cannot remove and re-apply the  ZIO XT patch monitor.  Your ZIO patch monitor will be mailed 3 day USPS to your address on file. It may take 3-5 days  to receive your monitor after you have been enrolled.  Once you have received your monitor, please review the enclosed instructions. Your monitor  has already been registered assigning a specific monitor serial # to you.  Billing and Patient Assistance Program Information  We have supplied [employer] with any of your  insurance information on file for billing purposes. [employer] offers a sliding scale Patient Assistance Program for patients that do not have  insurance, or whose insurance does not completely cover the cost of the ZIO monitor.  You must apply for the Patient Assistance Program to qualify for this discounted rate.  To apply, please call [employer] at [phone], select option 4, select option 2, ask to apply for   Patient Assistance Program. Meredeth Ide will ask your household income, and how many people  are in your household. They will quote your out-of-pocket cost based on that information.  [employer] will also be able to set up a 34-month, interest-free payment plan if needed.  Applying the monitor   Shave hair from upper left chest.  Hold abrader disc by orange tab. Rub abrader in 40 strokes over the upper left chest as  indicated in your monitor instructions.  Clean area with 4 enclosed alcohol pads. Let dry.  Apply patch as indicated in monitor instructions. Patch will be placed under collarbone on left  side of chest with arrow pointing upward.  Rub patch adhesive wings for 2 minutes. Remove white label marked "1". Remove the white  label marked "2". Rub patch adhesive wings for 2 additional minutes.  While looking in a mirror, press and release button in center of patch. A small green light will  flash 3-4 times. This will be your only indicator that the monitor has been turned on.  Do not shower for the first 24 hours. You may shower after the first 24 hours.  Press the button if you feel a symptom. You will hear a small click. Record Date, Time and  Symptom in the Patient Logbook.  When you are ready to remove the patch, follow instructions on the last 2 pages of Patient  Logbook. Stick patch monitor onto the last page of Patient Logbook.  Place Patient Logbook in the blue and white box. Use locking tab on box and tape box closed  securely. The blue and white box has prepaid postage on it. Please place it in the mailbox as  soon as possible. Your physician should have your test results approximately 7 days after the  monitor has been mailed back to [facility].  Call [facility] Customer Care at [phone] if you have questions regarding  your ZIO XT patch monitor. Call them immediately if you see an orange light blinking on your  monitor.  If your monitor falls off in less than 4  days, contact our Monitor department at [phone].  If your monitor becomes loose or falls off after 4 days call [employer] at [phone] for  suggestions on securing your monitor

## 2023-03-15 DIAGNOSIS — R002 Palpitations: Secondary | ICD-10-CM

## 2023-04-02 ENCOUNTER — Other Ambulatory Visit: Payer: Self-pay | Admitting: Cardiovascular Disease

## 2023-04-02 DIAGNOSIS — I739 Peripheral vascular disease, unspecified: Secondary | ICD-10-CM

## 2023-04-10 ENCOUNTER — Ambulatory Visit (HOSPITAL_BASED_OUTPATIENT_CLINIC_OR_DEPARTMENT_OTHER): Payer: Medicare HMO

## 2023-04-10 ENCOUNTER — Telehealth: Payer: Self-pay | Admitting: Cardiovascular Disease

## 2023-04-10 DIAGNOSIS — I441 Atrioventricular block, second degree: Secondary | ICD-10-CM | POA: Diagnosis not present

## 2023-04-10 DIAGNOSIS — R0989 Other specified symptoms and signs involving the circulatory and respiratory systems: Secondary | ICD-10-CM | POA: Diagnosis not present

## 2023-04-10 DIAGNOSIS — I471 Supraventricular tachycardia, unspecified: Secondary | ICD-10-CM | POA: Diagnosis not present

## 2023-04-10 DIAGNOSIS — R0602 Shortness of breath: Secondary | ICD-10-CM

## 2023-04-10 DIAGNOSIS — R002 Palpitations: Secondary | ICD-10-CM | POA: Diagnosis not present

## 2023-04-10 DIAGNOSIS — R5383 Other fatigue: Secondary | ICD-10-CM | POA: Diagnosis not present

## 2023-04-10 DIAGNOSIS — I739 Peripheral vascular disease, unspecified: Secondary | ICD-10-CM | POA: Diagnosis not present

## 2023-04-10 DIAGNOSIS — R0683 Snoring: Secondary | ICD-10-CM | POA: Diagnosis not present

## 2023-04-10 DIAGNOSIS — R55 Syncope and collapse: Secondary | ICD-10-CM | POA: Diagnosis not present

## 2023-04-10 LAB — ECHOCARDIOGRAM COMPLETE
Area-P 1/2: 2.68 cm2
S' Lateral: 2 cm

## 2023-04-10 NOTE — Telephone Encounter (Signed)
Calling with abnormal results. Please advise  

## 2023-04-10 NOTE — Telephone Encounter (Signed)
Mailbox full- unable to leave voicemail.

## 2023-04-10 NOTE — Telephone Encounter (Addendum)
Amanda Bennett with [employer] called to report critical report.   Patient had ventricular asystole with high grade av block 8.9 sec pg 23 strip 9. On July 16th at 4:12 am  Spoke with DOD Dr. Allyson Sabal , he stated she needs to be seen tomorrow.   Dr. Scharlene Gloss agreed to [facility] his 1 pm slot  Tried to call patient no answer. Left voicemail to return call to office

## 2023-04-11 ENCOUNTER — Ambulatory Visit (HOSPITAL_BASED_OUTPATIENT_CLINIC_OR_DEPARTMENT_OTHER)
Admission: RE | Admit: 2023-04-11 | Discharge: 2023-04-11 | Disposition: A | Discharge status: Home / Self Care | Payer: Medicare HMO | Source: Ambulatory Visit | Attending: Cardiovascular Disease | Admitting: Cardiovascular Disease

## 2023-04-11 ENCOUNTER — Ambulatory Visit: Payer: Medicare HMO | Admitting: Cardiovascular Disease

## 2023-04-11 ENCOUNTER — Ambulatory Visit (HOSPITAL_COMMUNITY)
Admission: RE | Admit: 2023-04-11 | Discharge: 2023-04-11 | Disposition: A | Discharge status: Home / Self Care | Payer: Medicare HMO | Source: Ambulatory Visit | Attending: Cardiovascular Disease | Admitting: Cardiovascular Disease

## 2023-04-11 ENCOUNTER — Encounter: Payer: Self-pay | Admitting: Cardiovascular Disease

## 2023-04-11 VITALS — BP 142/90 | HR 75 | Ht 64.0 in | Wt 129.8 lb

## 2023-04-11 DIAGNOSIS — R55 Syncope and collapse: Secondary | ICD-10-CM | POA: Insufficient documentation

## 2023-04-11 DIAGNOSIS — I441 Atrioventricular block, second degree: Secondary | ICD-10-CM | POA: Insufficient documentation

## 2023-04-11 DIAGNOSIS — R0989 Other specified symptoms and signs involving the circulatory and respiratory systems: Secondary | ICD-10-CM

## 2023-04-11 DIAGNOSIS — R5383 Other fatigue: Secondary | ICD-10-CM

## 2023-04-11 DIAGNOSIS — R0683 Snoring: Secondary | ICD-10-CM

## 2023-04-11 DIAGNOSIS — I739 Peripheral vascular disease, unspecified: Secondary | ICD-10-CM | POA: Diagnosis not present

## 2023-04-11 DIAGNOSIS — I471 Supraventricular tachycardia, unspecified: Secondary | ICD-10-CM | POA: Diagnosis not present

## 2023-04-11 DIAGNOSIS — R0602 Shortness of breath: Secondary | ICD-10-CM | POA: Diagnosis not present

## 2023-04-11 NOTE — Telephone Encounter (Signed)
Patient walked in clinic today. I explained we was trying to get her seen by a provider she already had and imaging appointment scheduled today will see if DOD will see her  after.

## 2023-04-11 NOTE — Progress Notes (Signed)
Cardiology Office Note:   Date:  04/11/2023  NAME:  Amanda Bennett    MRN: [id number] DOB:  05/20/1956   PCP:  Merri Brunette, MD  Cardiologist:  Lorine Bears, MD  Electrophysiologist:  None   Referring MD: Merri Brunette, MD   Chief Complaint  Patient presents with   Follow-up         History of Present Illness:   Amanda Bennett is a 67 y.o. female with a hx of coronary calcium, COPD who presents for follow-up of monitor.  She wore a monitor for dizziness.  That monitor does show evidence of high-grade AV block.  She had a total of 6 episodes described as pauses on her monitor however they are actually high-grade AV block with no escape rhythm.  These occurred in the early morning hours.  1 occurred at 8.9 seconds at 4:12 AM.  Another episode up to 4.5 seconds occurred at 6:21 AM.    She reports for the past few months she has had episodes of heart racing and dizziness.  She is actually passed out.  She passed out 2 months ago.  No further episodes.  She tells me she can just blackout.  She works as a Paediatric nurse.  She describes no chest pain or trouble breathing prior to the episodes.  Some of the episodes are triggered by heart racing.  She has never had a heart attack or stroke.  CT head from last year was unremarkable.  Her blood pressure is 142/90.  She does describe daily fatigue and low energy.  All of her conduction issues occurred at night.  I do wonder if she has sleep apnea.  She does need an updated panel of labs.  We discussed a home sleep test.  STOP-BANG score 5.  Her monitor did show episodes of SVT.  She had 23 episodes of SVT.  Maximum rate 187 bpm up to 4 beats.  The longest duration of these episodes was 12 seconds.  We did discuss that they are very short in duration and likely do not warrant treatment.  With evidence of high-grade conduction disease I am a bit worried about that.  She had carotid artery ultrasounds.  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  She is a former smoker.  No alcohol or drug use.  She is drinking plenty of water.  She does work as a Paediatric nurse.  She overall is stable.  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   Anxiety    Bruises easily    Cervical spinal stenosis 2008   Chronic constipation    Chronic pain syndrome    neck and lower back   COPD (chronic obstructive pulmonary disease) (HCC)    Depression    Family history of adverse reaction to anesthesia    mother--  ponv   GERD (gastroesophageal reflux disease)    History of basal cell carcinoma excision    History of prolonged Q-T interval on ECG    per cardiologist, dr Kirke Corin, note in epic-- in the setting of treatment with medications that can cause prolonged QT, went back to normal after stopping the medications (was seen by dr Graciela Husbands no further workup recommended)   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   Hypertension    Insomnia    Lesion of ulnar nerve    Mild carotid artery disease (HCC)    per duplex 08-23-2017  bilateral ICA 1-39%   OA (osteoarthritis)    neck and back   Occlusion of left vertebral artery    noted 05-28-2017 MRI;  followed by cardiologist, dr Kirke Corin (per cardiologist has collaterals)   Osteoporosis    Perirectal abscess 07/08/2018   PONV (postoperative nausea and vomiting)    sometimes queezy   Primary localized osteoarthrosis, pelvic region and thigh    Wears dentures    upper   Wears glasses     Past Surgical History: Past Surgical History:  Procedure Laterality Date   ABDOMINAL HYSTERECTOMY  age 45s   CARPAL TUNNEL RELEASE Bilateral 2018   CHOLECYSTECTOMY     CHOLECYSTECTOMY  10/30/2011   Procedure: LAPAROSCOPIC CHOLECYSTECTOMY WITH INTRAOPERATIVE CHOLANGIOGRAM;  Surgeon:  Wilmon Arms. Corliss Skains, MD;  Location: [facility];  Service: General;  Laterality: N/A;   COLONOSCOPY     DORSAL COMPARTMENT RELEASE Left 07/20/2015   Procedure: RELEASE DORSAL COMPARTMENT (DEQUERVAIN);  Surgeon: Christena Flake, MD;  Location: [facility];  Service: Orthopedics;  Laterality: Left;   EVALUATION UNDER ANESTHESIA WITH FISTULECTOMY N/A 10/02/2018   Procedure: ANAL EXAM UNDER ANESTHESIA;  Surgeon: Romie Levee, MD;  Location: [facility];  Service: General;  Laterality: N/A;   HEMORRHOID SURGERY  11/25/2006   dr Lurene Shadow  @[facility]    I & D, ORIF RIGHT RING FINGER  Oct 9th, 15th ,2009   near amputation from dog bite   INCISION AND DRAINAGE PERIRECTAL ABSCESS N/A 07/08/2018   Procedure: IRRIGATION AND DEBRIDEMENT PERIRECTAL ABSCESS;  Surgeon: Sung Amabile, DO;  Location: [facility];  Service: General;  Laterality: N/A;   INGUINAL LYMPH NODE BIOPSY Right 02/03/2014   Procedure: INGUINAL LYMPH NODE BIOPSY;  Surgeon: Liz Malady, MD;  Location: [facility];  Service: General;  Laterality: Right;  right inguinal area   LAPAROSCOPIC SALPINGOOPHERECTOMY Bilateral age 56s   LIGATION OF INTERNAL FISTULA TRACT N/A 04/01/2019   Procedure: LIGATION OF INTERNAL FISTULA TRACT RESECTION OF ANAL LESION;  Surgeon: Romie Levee, MD;  Location: [facility];  Service: General;  Laterality: N/A;   ORIF CLAVICLE FRACTURE Right 2003   HARDWARE REMOVAL 08-22-2004 BY DR Ranell Patrick @[facility]    PLACEMENT OF SETON N/A 10/02/2018   Procedure: PLACEMENT OF SETON VS FISTULOTOMY, BIOPSY;  Surgeon: Romie Levee, MD;  Location: [facility];  Service: General;  Laterality: N/A;   SHOULDER ARTHROSCOPY Left 07/20/2015   Procedure: ARTHROSCOPY SHOULDER DEBRIDEMENT AND DECOMPRESSION;  Surgeon: Christena Flake, MD;  Location: [facility];  Service: Orthopedics;  Laterality: Left;   SHOULDER ARTHROSCOPY Left 01/12/2016   Procedure: ARTHROSCOPY SHOULDER WITH DEBRIDEMENT  AND EXCISION OF THE DISTAL CLAVICLE;  Surgeon: Christena Flake, MD;  Location: [facility];  Service: Orthopedics;  Laterality: Left;    Current Medications: Current Meds  Medication Sig   ASPIRIN 81 PO Take by mouth daily.   mirtazapine (REMERON) 45 MG tablet TAKE 1 TABLET BY MOUTH AT BEDTIME.   valsartan (DIOVAN) 80 MG tablet Take 1 tablet (80 mg total) by mouth daily.     Allergies:    Fluoxetine hcl, Lisinopril, Tramadol, Statins, and Hydrocodone   Social History: Social History   Socioeconomic History   Marital status: Single    Spouse name: Not on file   Number of children: Not on  file   Years of education: Not on file   Highest education level: Not on file  Occupational History   Occupation: Horse Trainer  Tobacco Use   Smoking status: Former    Types: Cigarettes   Smokeless tobacco: Never  Vaping Use   Vaping status: Former  Substance and Sexual Activity   Alcohol use: Not Currently   Drug use: No   Sexual activity: Not on file  Other Topics Concern   Not on file  Social History Narrative   ** Merged History Encounter **       Disabled 2 daughters Reports single marital status Smoker, 2 caffeinated beverages a day no other tobacco no drug use no alcohol   Social Determinants of Corporate investment banker Strain: Not on file  Food Insecurity: Not on file  Transportation Needs: Not on file  Physical Activity: Not on file  Stress: Not on file  Social Connections: Not on file     Family History: The patient's family history includes Anxiety disorder in her mother; Cancer in her paternal grandmother; Colon cancer (age of onset: 33) in her father; Coronary artery disease in her mother; Dementia in her mother; Esophageal cancer in her father; Hyperlipidemia in her mother; Hypertension in her mother; Liver disease in her father; Pancreatic cancer in her father; Prostate cancer in her father; Valvular heart disease in her mother. There is no history of Stroke or  Stomach cancer.  ROS:   All other ROS reviewed and negative. Pertinent positives noted in the HPI.     EKGs/Labs/Other Studies Reviewed:   The following studies were personally reviewed by me today:  EKG:  EKG is ordered today.    EKG Interpretation Date/Time:  Thursday April 11 2023 13:32:02 EDT Ventricular Rate:  76 PR Interval:  186 QRS Duration:  102 QT Interval:  406 QTC Calculation: 456 R Axis:   56  Text Interpretation: Normal sinus rhythm Incomplete right bundle branch block Confirmed by Lennie Odor [phone]) on 04/11/2023 1:56:12 PM   CCTA 07/19/2021 IMPRESSION: 1. Coronary calcium score of 0. This was 0 percentile for age and sex matched control.   2. Normal coronary origin with right dominance.   3. No evidence of CAD. CAD-RADS 0. No evidence of CAD (0%). Consider non-atherosclerotic causes of chest pain.  TTE 04/10/2023  1. Left ventricular ejection fraction, by estimation, is 60 to 65%. The  left ventricle has normal function. The left ventricle has no regional  wall motion abnormalities. Left ventricular diastolic parameters are  consistent with Grade I diastolic  dysfunction (impaired relaxation). The average left ventricular global  longitudinal strain is 22.5 %. The global longitudinal strain is normal.   2. Right ventricular systolic function is normal. The right ventricular  size is normal.   3. The mitral valve is normal in structure. No evidence of mitral valve  regurgitation. No evidence of mitral stenosis.   4. The aortic valve is tricuspid. Aortic valve regurgitation is not  visualized. No aortic stenosis is present.   5. The inferior vena cava is normal in size with greater than 50%  respiratory variability, suggesting right atrial pressure of 3 mmHg.   Recent Labs: 07/06/2022: ALT 16; BUN 8; Creatinine, Ser 0.71; Hemoglobin 13.0; Platelets 240; Potassium 4.0; Sodium 140   Recent Lipid Panel    Component Value Date/Time   CHOL 281 (H)  11/20/2016 1148   TRIG 234 (H) 11/20/2016 1148   HDL 60 11/20/2016 1148   CHOLHDL 4.7 11/20/2016  1148   VLDL 47 (H) 11/20/2016 1148   LDLCALC 174 (H) 11/20/2016 1148   LDLDIRECT 136 (H) 04/08/2012 1125    Physical Exam:   VS:  BP (!) 142/90 (BP Location: Left Arm, Patient Position: Sitting, Cuff Size: Normal)   Pulse 75   Ht 5\' 4"  (1.626 m)   Wt 129 lb 12.8 oz (58.9 kg)   LMP 10/23/2002   SpO2 91%   BMI 22.28 kg/m    Wt Readings from Last 3 Encounters:  04/11/23 129 lb 12.8 oz (58.9 kg)  03/12/23 126 lb 9.6 oz (57.4 kg)  07/06/22 120 lb (54.4 kg)    General: Well nourished, well developed, in no acute distress Head: Atraumatic, normal size  Eyes: PEERLA, EOMI  Neck: Supple, no JVD Endocrine: No thryomegaly Cardiac: Normal S1, S2; RRR; no murmurs, rubs, or gallops Lungs: Clear to auscultation bilaterally, no wheezing, rhonchi or rales  Abd: Soft, nontender, no hepatomegaly  Ext: No edema, pulses 2+ Musculoskeletal: No deformities, BUE and BLE strength normal and equal Skin: Warm and dry, no rashes   Neuro: Alert and oriented to person, place, time, and situation, CNII-XII grossly intact, no focal deficits  Psych: Normal mood and affect   ASSESSMENT:   Amanda Bennett is a 67 y.o. female who presents for the following: 1. AV block, 2nd degree   2. SVT (supraventricular tachycardia)   3. Snoring   4. Other fatigue   5. Syncope and collapse     PLAN:   1. AV block, 2nd degree 2. SVT (supraventricular tachycardia) 3. Snoring 4. Other fatigue 5. Syncope and collapse -Evidence of high-grade conduction disease mainly at night.  She had a episode of high-grade AV block that lasted up to 8.9 seconds.  She does have P waves.  She had no QRS complexes.  She actually has an EKG that shows narrow intervals.  She has really no evidence of conduction disease at baseline.  She had other episodes at 6 AM.  I do question if she has sleep apnea.  She has snoring and she is tired all  the time.  This may just be sleep apnea to explain this.  She did not have any high-grade conduction disease during the day.  She did have SVT episodes but they were very short-lived up to 12.5 seconds.  In the setting of high-grade conduction disease would avoid AV nodal agents.  Unclear if heart passing out spells are related to this or something else.  She is drinking plenty of water.  Echo was normal.  Carotid showed no disease in the internal carotids.  She needs a full panel of labs including CMP, CBC and TSH.  I would like for her to see electrophysiology.  Although I do not believe she will end up with a pacemaker based on conduction disease that occurs at night she may warrant a loop recorder.  She had a coronary CTA in 2022 that was unremarkable.  Her CV exam is unremarkable.  We will have her follow-up with Dr. Kary Kos later this month for further evaluation.  In the interim I have recommended no further driving.   Disposition: Return if symptoms worsen or fail to improve.  Medication Adjustments/Labs and Tests Ordered: Current medicines are reviewed at length with the patient today.  Concerns regarding medicines are outlined above.  Orders Placed This Encounter  Procedures   CBC with Differential/Platelet   Comprehensive metabolic panel   [id number]   Ambulatory referral to Cardiac Electrophysiology  EKG 12-Lead   Home sleep test   No orders of the defined types were placed in this encounter.  Patient Instructions  Medication Instructions:  Your physician recommends that you continue on your current medications as directed. Please refer to the Current Medication list given to you today.  *If you need a refill on your cardiac medications before your next appointment, please call your pharmacy*   Lab Work: Your physician recommends that you have labs drawn today: CBC, CMP, TSH If you have labs (blood work) drawn today and your tests are completely normal, you will receive your  results only by: MyChart Message (if you have MyChart) OR A paper copy in the mail If you have any lab test that is abnormal or we need to change your treatment, we will call you to review the results.   Testing/Procedures: Your physician has recommended that you have a sleep study. This test records several body functions during sleep, including: brain activity, eye movement, oxygen and carbon dioxide blood levels, heart rate and rhythm, breathing rate and rhythm, the flow of air through your mouth and nose, snoring, body muscle movements, and chest and belly movement.    Follow-Up: At [facility], you and your health needs are our priority.  As part of our continuing mission to provide you with exceptional heart care, we have created designated Provider Care Teams.  These Care Teams include your primary Cardiologist (physician) and Advanced Practice Providers (APPs -  Physician Assistants and Nurse Practitioners) who all work together to provide you with the care you need, when you need it.  Your next appointment:    Keep scheduled follow up  Provider:   Lorine Bears, MD    Time Spent with Patient: I have spent a total of 35 minutes with patient reviewing hospital notes, telemetry, EKGs, labs and examining the patient as well as establishing an assessment and plan that was discussed with the patient.  > 50% of time was spent in direct patient care.  Signed, Lenna Gilford. Flora Lipps, MD, [facility]  [facility]  [address] [place], [place] [address] [phone]  04/11/2023 2:58 PM

## 2023-04-11 NOTE — Telephone Encounter (Signed)
Tried patient number and her mailbox is full.  Her daughter number is not in service.  Spoke with her sister and she will have her give [place] a call. Patient does have appointment today so 1 pm may not work. Will wait for her call to see what we can do about an appointment.

## 2023-04-11 NOTE — Patient Instructions (Signed)
Medication Instructions:  Your physician recommends that you continue on your current medications as directed. Please refer to the Current Medication list given to you today.  *If you need a refill on your cardiac medications before your next appointment, please call your pharmacy*   Lab Work: Your physician recommends that you have labs drawn today: CBC, CMP, TSH If you have labs (blood work) drawn today and your tests are completely normal, you will receive your results only by: MyChart Message (if you have MyChart) OR A paper copy in the mail If you have any lab test that is abnormal or we need to change your treatment, we will call you to review the results.   Testing/Procedures: Your physician has recommended that you have a sleep study. This test records several body functions during sleep, including: brain activity, eye movement, oxygen and carbon dioxide blood levels, heart rate and rhythm, breathing rate and rhythm, the flow of air through your mouth and nose, snoring, body muscle movements, and chest and belly movement.    Follow-Up: At [facility], you and your health needs are our priority.  As part of our continuing mission to provide you with exceptional heart care, we have created designated Provider Care Teams.  These Care Teams include your primary Cardiologist (physician) and Advanced Practice Providers (APPs -  Physician Assistants and Nurse Practitioners) who all work together to provide you with the care you need, when you need it.  Your next appointment:    Keep scheduled follow up  Provider:   Lorine Bears, MD

## 2023-04-29 ENCOUNTER — Encounter: Payer: Self-pay | Admitting: Cardiovascular Disease

## 2023-04-29 ENCOUNTER — Ambulatory Visit: Payer: Medicare HMO | Admitting: Cardiovascular Disease

## 2023-04-29 VITALS — BP 152/100 | HR 70 | Ht 64.0 in | Wt 133.6 lb

## 2023-04-29 DIAGNOSIS — I441 Atrioventricular block, second degree: Secondary | ICD-10-CM

## 2023-04-29 DIAGNOSIS — R55 Syncope and collapse: Secondary | ICD-10-CM

## 2023-04-29 DIAGNOSIS — I471 Supraventricular tachycardia, unspecified: Secondary | ICD-10-CM

## 2023-04-29 NOTE — Patient Instructions (Signed)
Medication Instructions:  Your physician recommends that you continue on your current medications as directed. Please refer to the Current Medication list given to you today. *If you need a refill on your cardiac medications before your next appointment, please call your pharmacy*   Testing/Procedures: Implantable Loop Recorder   Follow-Up: At [facility], you and your health needs are our priority.  As part of our continuing mission to provide you with exceptional heart care, we have created designated Provider Care Teams.  These Care Teams include your primary Cardiologist (physician) and Advanced Practice Providers (APPs -  Physician Assistants and Nurse Practitioners) who all work together to provide you with the care you need, when you need it.  We recommend signing up for the patient portal called "MyChart".  Sign up information is provided on this After Visit Summary.  MyChart is used to connect with patients for Virtual Visits (Telemedicine).  Patients are able to view lab/test results, encounter notes, upcoming appointments, etc.  Non-urgent messages can be sent to your provider as well.   To learn more about what you can do with MyChart, go to [email or web address].    Your next appointment:   We will contact you to set this up   Provider:   York Pellant, MD

## 2023-04-29 NOTE — Progress Notes (Signed)
  Electrophysiology Office Note:    Date:  04/29/2023   ID:  Amanda Bennett, DOB [birth year], MRN [id number]  PCP:  Merri Brunette, MD   [facility] Providers Cardiologist:  Lorine Bears, MD     Referring MD: Sande Rives, *   History of Present Illness:    Amanda Bennett is a 67 y.o. female with a medical history significant for COPD, referred for management of arrhythmia.     A monitor was placed after an episode of syncope. 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 Additionally, there were brief episodes of SVT lasting up to 12.5 seconds.     She has been having frequent episodes of dizziness and lightheadedness.  These are oftentimes associated with visions of flashing lights.  She has had multiple episodes syncope over the years.  She did not have any syncope and does not recall having lightheadedness while wearing her monitor  EKGs/Labs/Other Studies Reviewed Today:    Echocardiogram:  TTE 04/10/2023 EF 60-65%, Gr I diastolic dysfunction. No significant valvular disease   Monitors:  Zio monitor - 13 days, July 2024 - my interpretation Sinus rhythm HR 22-139 bpm, avg 84 Multiple nocturnal pauses noted with high grade AV block and sinus node dysfunction  Stress testing:   Advanced imaging:   Cardiac catherization    EKG:   EKG Interpretation Date/Time:  Monday April 29 2023 13:51:33 EDT Ventricular Rate:  70 PR Interval:  180 QRS Duration:  102 QT Interval:  402 QTC Calculation: 434 R Axis:   65  Text Interpretation: Normal sinus rhythm Incomplete right bundle branch block When compared with ECG of 11-Apr-2023 13:32, No significant change was found Confirmed by York Pellant [phone]) on 04/29/2023 2:11:25 PM     Physical Exam:    VS:  BP (!) 152/100 (BP Location: Left Arm, Patient Position: Sitting, Cuff Size: Normal)   Pulse 70   Ht 5\' 4"  (1.626 m)   Wt 133 lb 9.6  oz (60.6 kg)   LMP 10/23/2002   SpO2 97%   BMI 22.93 kg/m     Wt Readings from Last 3 Encounters:  04/29/23 133 lb 9.6 oz (60.6 kg)  04/11/23 129 lb 12.8 oz (58.9 kg)  03/12/23 126 lb 9.6 oz (57.4 kg)     GEN:  Well nourished, well developed in no acute distress CARDIAC: RRR, no murmurs, rubs, gallops RESPIRATORY:  Normal work of breathing MUSCULOSKELETAL: no edema    ASSESSMENT & PLAN:    Nocturnal pauses Not a good indication for pacemaker Pt will need sleep study to evaluate for OSA as possible reversible cause for pauses  Syncope Symptoms are a little atypical for arrhythmogenic syncope though rhythm abnormality cannot be excluded based upon history I recommended a loop recorder to assess her rhythm definitively. I explained the rationale and logistics for the loop recorder placement including risks of minor bleeding and infection.  I explained the monitoring process with the associated monthly charge.  SVT Episodes detected on her monitor were brief We will monitor with loop recorder    Signed, Maurice Small, MD  04/29/2023 2:35 PM    [facility]

## 2023-04-30 ENCOUNTER — Ambulatory Visit: Payer: Medicare HMO | Admitting: Cardiovascular Disease

## 2023-05-14 ENCOUNTER — Encounter: Payer: Self-pay | Admitting: Cardiovascular Disease

## 2023-05-14 ENCOUNTER — Ambulatory Visit: Payer: Medicare HMO | Attending: Cardiovascular Disease | Admitting: Cardiovascular Disease

## 2023-05-14 VITALS — BP 128/74 | HR 84 | Ht 67.0 in | Wt 134.8 lb

## 2023-05-14 DIAGNOSIS — I10 Essential (primary) hypertension: Secondary | ICD-10-CM

## 2023-05-14 DIAGNOSIS — E785 Hyperlipidemia, unspecified: Secondary | ICD-10-CM

## 2023-05-14 DIAGNOSIS — R002 Palpitations: Secondary | ICD-10-CM | POA: Diagnosis not present

## 2023-05-14 DIAGNOSIS — M79605 Pain in left leg: Secondary | ICD-10-CM

## 2023-05-14 DIAGNOSIS — I442 Atrioventricular block, complete: Secondary | ICD-10-CM

## 2023-05-14 DIAGNOSIS — M79604 Pain in right leg: Secondary | ICD-10-CM

## 2023-05-14 NOTE — Patient Instructions (Signed)
Medication Instructions:  No changes *If you need a refill on your cardiac medications before your next appointment, please call your pharmacy*   Lab Work: None ordered If you have labs (blood work) drawn today and your tests are completely normal, you will receive your results only by: [place] (if you have MyChart) OR A paper copy in the mail If you have any lab test that is abnormal or we need to change your treatment, we will call you to review the results.   Testing/Procedures: None ordered   Follow-Up: At [facility], you and your health needs are our priority.  As part of our continuing mission to provide you with exceptional heart care, we have created designated Provider Care Teams.  These Care Teams include your primary Cardiologist (physician) and Advanced Practice Providers (APPs -  Physician Assistants and Nurse Practitioners) who all work together to provide you with the care you need, when you need it.  We recommend signing up for the patient portal called "MyChart".  Sign up information is provided on this After Visit Summary.  MyChart is used to connect with patients for Virtual Visits (Telemedicine).  Patients are able to view lab/test results, encounter notes, upcoming appointments, etc.  Non-urgent messages can be sent to your provider as well.   To learn more about what you can do with MyChart, go to [email or web address].    Your next appointment:   6 month(s)  Provider:   Kathlyn Sacramento, MD

## 2023-05-14 NOTE — Progress Notes (Signed)
Cardiology Office Note   Date:  05/14/2023   ID:  Amanda Bennett, DOB 12/12/1955, MRN [id number]  PCP:  Merri Brunette, MD  Cardiologist:   Lorine Bears, MD   No chief complaint on file.     History of Present Illness: Amanda Bennett is a 67 y.o. female who presents for a follow up visit regarding hypertension, hyperlipidemia and previously prolonged QT interval.  She has history of prior tobacco use, hypertension, anxiety and hepatitis C. she quit smoking in 2016. She has family history of prolonged QT syndrome and she did have mildly prolonged QT on EKG in the setting of treatment with medications that can cause that. QT went back to normal after stopping the medications. She was seen by Dr. Graciela Husbands and no further workup was recommended. Nuclear stress test in 2015 was normal. Previous CT scan did show evidence of coronary atherosclerosis. Echocardiogram in May 2018 showed normal LV systolic function and atrial septal aneurysm.  She has known history of hyperlipidemia and did not tolerate treatment with atorvastatin or rosuvastatin due to myalgia.    She had an MRI of cervical spine which showed occluded left vertebral artery.  She has no prior history of stroke.  She underwent cardiac CTA in November 2022 which showed a calcium score of 0 with no evidence of obstructive disease.  Echocardiogram at that time showed normal LV systolic function with only mild mitral valve prolapse.  She was seen in July and reported episodes of transient vision loss with dizziness.  In addition, she complained of increased palpitations and she reported a syncopal episode.  She underwent an echocardiogram which showed normal LV systolic function with no significant valvular abnormalities.  She had an outpatient monitor done which showed 23 episodes of short SVT.  She was also noted to have 6 pauses the longest lasted 8.9 seconds likely with high-grade AV block.  These episodes happened in the early  morning hours presumably during sleep. Carotid Doppler showed mild nonobstructive carotid disease with known chronically occluded left vertebral artery. Lower extremity arterial Doppler showed normal ABI and toe pressure.  She was seen by EP recently and is scheduled for a loop recorder in early October.  In addition, a sleep study was requested given nocturnal pauses.  She reports stable symptoms overall with intermittent episodes of dizziness and presyncope but no loss of consciousness.  She also complains of palpitations.    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   Anxiety    Bruises easily    Cervical spinal stenosis 2008   Chronic constipation    Chronic pain syndrome    neck and lower back   COPD (chronic obstructive pulmonary disease) (HCC)    Depression    Family history of adverse reaction to anesthesia    mother--  ponv   GERD (gastroesophageal reflux disease)    History of basal cell carcinoma excision    History of prolonged Q-T interval on ECG    per cardiologist, dr Kirke Corin, note in epic-- in the setting of treatment with medications that can cause prolonged QT, went back to normal after stopping the medications (was seen by dr Graciela Husbands no further workup recommended)   History of staph infection    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   Hypertension    Insomnia    Lesion of ulnar nerve    Mild carotid artery  disease (HCC)    per duplex 08-23-2017  bilateral ICA 1-39%   OA (osteoarthritis)    neck and back   Occlusion of left vertebral artery    noted 05-28-2017 MRI;  followed by cardiologist, dr Kirke Corin (per cardiologist has collaterals)   Osteoporosis    Perirectal abscess 07/08/2018   PONV (postoperative nausea and vomiting)    sometimes queezy   Primary localized osteoarthrosis, pelvic region and thigh    Wears dentures    upper   Wears glasses     Past Surgical History:   Procedure Laterality Date   ABDOMINAL HYSTERECTOMY  age 66s   CARPAL TUNNEL RELEASE Bilateral 2018   CHOLECYSTECTOMY     CHOLECYSTECTOMY  10/30/2011   Procedure: LAPAROSCOPIC CHOLECYSTECTOMY WITH INTRAOPERATIVE CHOLANGIOGRAM;  Surgeon: Wilmon Arms. Corliss Skains, MD;  Location: [facility];  Service: General;  Laterality: N/A;   COLONOSCOPY     DORSAL COMPARTMENT RELEASE Left 07/20/2015   Procedure: RELEASE DORSAL COMPARTMENT (DEQUERVAIN);  Surgeon: Christena Flake, MD;  Location: [facility];  Service: Orthopedics;  Laterality: Left;   EVALUATION UNDER ANESTHESIA WITH FISTULECTOMY N/A 10/02/2018   Procedure: ANAL EXAM UNDER ANESTHESIA;  Surgeon: Romie Levee, MD;  Location: [facility];  Service: General;  Laterality: N/A;   HEMORRHOID SURGERY  11/25/2006   dr Lurene Shadow  @[facility]    I & D, ORIF RIGHT RING FINGER  Oct 9th, 15th ,2009   near amputation from dog bite   INCISION AND DRAINAGE PERIRECTAL ABSCESS N/A 07/08/2018   Procedure: IRRIGATION AND DEBRIDEMENT PERIRECTAL ABSCESS;  Surgeon: Sung Amabile, DO;  Location: [facility];  Service: General;  Laterality: N/A;   INGUINAL LYMPH NODE BIOPSY Right 02/03/2014   Procedure: INGUINAL LYMPH NODE BIOPSY;  Surgeon: Liz Malady, MD;  Location: [facility];  Service: General;  Laterality: Right;  right inguinal area   LAPAROSCOPIC SALPINGOOPHERECTOMY Bilateral age 31s   LIGATION OF INTERNAL FISTULA TRACT N/A 04/01/2019   Procedure: LIGATION OF INTERNAL FISTULA TRACT RESECTION OF ANAL LESION;  Surgeon: Romie Levee, MD;  Location: [facility];  Service: General;  Laterality: N/A;   ORIF CLAVICLE FRACTURE Right 2003   HARDWARE REMOVAL 08-22-2004 BY DR Ranell Patrick @[facility]    PLACEMENT OF SETON N/A 10/02/2018   Procedure: PLACEMENT OF SETON VS FISTULOTOMY, BIOPSY;  Surgeon: Romie Levee, MD;  Location: [facility];  Service: General;  Laterality: N/A;   SHOULDER ARTHROSCOPY Left 07/20/2015   Procedure:  ARTHROSCOPY SHOULDER DEBRIDEMENT AND DECOMPRESSION;  Surgeon: Christena Flake, MD;  Location: [facility];  Service: Orthopedics;  Laterality: Left;   SHOULDER ARTHROSCOPY Left 01/12/2016   Procedure: ARTHROSCOPY SHOULDER WITH DEBRIDEMENT AND EXCISION OF THE DISTAL CLAVICLE;  Surgeon: Christena Flake, MD;  Location: [facility];  Service: Orthopedics;  Laterality: Left;     Current Outpatient Medications  Medication Sig Dispense Refill   ASPIRIN 81 PO Take by mouth daily.     cholestyramine (QUESTRAN) 4 g packet Take 1 packet (4 g total) by mouth daily with supper. 90 each 0   clonazePAM (KLONOPIN) 0.5 MG tablet Take 1 mg by mouth at bedtime.     dicyclomine (BENTYL) 20 MG tablet Take 1 tablet (20 mg total) by mouth 2 (two) times daily. 20 tablet 0   hydrocortisone (ANUSOL-HC) 2.5 % rectal cream Place 1 application. rectally 2 (two) times daily. 30 g 0   Lactase (LACTOSE INTOLERANCE PO) Take by mouth. One teaspoon three times daily before meals  magic mouthwash (lidocaine, diphenhydrAMINE, alum & mag hydroxide) suspension Swish and spit 5 mLs 4 (four) times daily as needed for mouth pain. 360 mL 0   mirtazapine (REMERON) 45 MG tablet TAKE 1 TABLET BY MOUTH AT BEDTIME. 90 tablet 3   ondansetron (ZOFRAN-ODT) 4 MG disintegrating tablet Take 1 tablet (4 mg total) by mouth every 8 (eight) hours as needed for nausea or vomiting. 20 tablet 0   promethazine-dextromethorphan (PROMETHAZINE-DM) 6.25-15 MG/5ML syrup Take 5 mLs by mouth 4 (four) times daily as needed for cough. 118 mL 0   valsartan (DIOVAN) 80 MG tablet Take 1 tablet (80 mg total) by mouth daily. 90 tablet 3   No current facility-administered medications for this visit.    Allergies:   Fluoxetine hcl, Lisinopril, Tramadol, Statins, and Hydrocodone    Social History:  The patient  reports that she has quit smoking. Her smoking use included cigarettes. She has never used smokeless tobacco. She reports that she does not currently use  alcohol. She reports that she does not use drugs.   Family History:  The patient's family history includes Anxiety disorder in her mother; Cancer in her paternal grandmother; Colon cancer (age of onset: 36) in her father; Coronary artery disease in her mother; Dementia in her mother; Esophageal cancer in her father; Hyperlipidemia in her mother; Hypertension in her mother; Liver disease in her father; Pancreatic cancer in her father; Prostate cancer in her father; Valvular heart disease in her mother.    ROS:  Please see the history of present illness.   Otherwise, review of systems are positive for none.   All other systems are reviewed and negative.    PHYSICAL EXAM: VS:  BP 128/74   Pulse 84   Ht 5\' 7"  (1.702 m)   Wt 134 lb 12.8 oz (61.1 kg)   LMP 10/23/2002   SpO2 97%   BMI 21.11 kg/m  , BMI Body mass index is 21.11 kg/m. GEN: Well nourished, well developed, in no acute distress  HEENT: normal  Neck: no JVD, or masses.  Right carotid bruit Cardiac: RRR; no murmurs, rubs, or gallops,no edema  Respiratory:  clear to auscultation bilaterally, normal work of breathing GI: soft, nontender, nondistended, + BS MS: no deformity or atrophy  Skin: warm and dry, no rash Neuro:  Strength and sensation are intact Psych: euthymic mood, full affect   EKG:  EKG is not ordered today.   Recent Labs: 04/11/2023: ALT 22; BUN 12; Creatinine, Ser 0.71; Hemoglobin 14.6; Platelets 270; Potassium 5.3; Sodium 142; TSH 0.778    Lipid Panel    Component Value Date/Time   CHOL 281 (H) 11/20/2016 1148   TRIG 234 (H) 11/20/2016 1148   HDL 60 11/20/2016 1148   CHOLHDL 4.7 11/20/2016 1148   VLDL 47 (H) 11/20/2016 1148   LDLCALC 174 (H) 11/20/2016 1148   LDLDIRECT 136 (H) 04/08/2012 1125      Wt Readings from Last 3 Encounters:  05/14/23 134 lb 12.8 oz (61.1 kg)  04/29/23 133 lb 9.6 oz (60.6 kg)  04/11/23 129 lb 12.8 oz (58.9 kg)        ASSESSMENT AND PLAN:   1.  Intermittent nocturnal  high-grade AV block : The patient will be undergoing a sleep study to evaluate for possible underlying sleep apnea.  In addition, she does complain of palpitations and outpatient monitor showed short runs of SVT.  Not able to treat her with a beta-blocker or calcium channel blocker given intermittent bradycardia.  She  is scheduled for a loop recorder placement in early October.  Ultimately, she might require a pacemaker.    2.  Exertional dyspnea: Previous cardiac CTA showed no significant coronary artery disease.  Recent echocardiogram was unremarkable.  3.  Bilateral leg pain : This does not seem to be due to peripheral arterial disease.  Recent ABI was normal.  4.  Essential hypertension: Blood pressure is controlled.  5.  Hyperlipidemia with intolerance to atorvastatin and rosuvastatin due to severe myalgia.  Currently not on medications.   Disposition:   FU with me in 6 months  Signed,  Lorine Bears, MD  05/14/2023 10:58 AM    [facility]

## 2023-05-22 ENCOUNTER — Emergency Department (HOSPITAL_BASED_OUTPATIENT_CLINIC_OR_DEPARTMENT_OTHER)
Admission: EM | Admit: 2023-05-22 | Discharge: 2023-05-22 | Disposition: A | Discharge status: Home / Self Care | Payer: Medicare HMO | Attending: Emergency Medicine | Admitting: Emergency Medicine

## 2023-05-22 ENCOUNTER — Emergency Department (HOSPITAL_BASED_OUTPATIENT_CLINIC_OR_DEPARTMENT_OTHER): Payer: Medicare HMO | Admitting: Radiology

## 2023-05-22 ENCOUNTER — Encounter (HOSPITAL_BASED_OUTPATIENT_CLINIC_OR_DEPARTMENT_OTHER): Payer: Self-pay | Admitting: Emergency Medicine

## 2023-05-22 ENCOUNTER — Other Ambulatory Visit: Payer: Self-pay

## 2023-05-22 ENCOUNTER — Other Ambulatory Visit (HOSPITAL_BASED_OUTPATIENT_CLINIC_OR_DEPARTMENT_OTHER): Payer: Self-pay

## 2023-05-22 DIAGNOSIS — W28XXXA Contact with powered lawn mower, initial encounter: Secondary | ICD-10-CM | POA: Insufficient documentation

## 2023-05-22 DIAGNOSIS — Z7982 Long term (current) use of aspirin: Secondary | ICD-10-CM | POA: Insufficient documentation

## 2023-05-22 DIAGNOSIS — S4981XA Other specified injuries of right shoulder and upper arm, initial encounter: Secondary | ICD-10-CM | POA: Diagnosis not present

## 2023-05-22 DIAGNOSIS — I10 Essential (primary) hypertension: Secondary | ICD-10-CM | POA: Diagnosis not present

## 2023-05-22 DIAGNOSIS — R0781 Pleurodynia: Secondary | ICD-10-CM | POA: Diagnosis not present

## 2023-05-22 DIAGNOSIS — J449 Chronic obstructive pulmonary disease, unspecified: Secondary | ICD-10-CM | POA: Insufficient documentation

## 2023-05-22 DIAGNOSIS — M25511 Pain in right shoulder: Secondary | ICD-10-CM | POA: Diagnosis not present

## 2023-05-22 DIAGNOSIS — Z87891 Personal history of nicotine dependence: Secondary | ICD-10-CM | POA: Insufficient documentation

## 2023-05-22 DIAGNOSIS — I2510 Atherosclerotic heart disease of native coronary artery without angina pectoris: Secondary | ICD-10-CM | POA: Insufficient documentation

## 2023-05-22 MED ORDER — OXYCODONE HCL 5 MG PO TABS
5.0000 mg | ORAL_TABLET | Freq: Four times a day (QID) | ORAL | 0 refills | Status: DC | PRN
Start: 2023-05-22 — End: 2023-06-28
  Filled 2023-05-22: qty 6, 2d supply, fill #0

## 2023-05-22 MED ORDER — MORPHINE SULFATE (PF) 4 MG/ML IV SOLN
4.0000 mg | Freq: Once | INTRAVENOUS | Status: AC
  Administered 2023-05-22: 4 mg via INTRAMUSCULAR
  Filled 2023-05-22: qty 1

## 2023-05-22 MED ORDER — MELOXICAM 15 MG PO TABS
15.0000 mg | ORAL_TABLET | Freq: Every day | ORAL | 0 refills | Status: DC | PRN
  Filled 2023-05-22: qty 30, 30d supply, fill #0

## 2023-05-22 MED ORDER — KETOROLAC TROMETHAMINE 10 MG PO TABS
10.0000 mg | ORAL_TABLET | Freq: Four times a day (QID) | ORAL | 0 refills | Status: DC | PRN
  Filled 2023-05-22: qty 20, 5d supply, fill #0

## 2023-05-22 NOTE — ED Provider Notes (Signed)
[place] EMERGENCY DEPARTMENT AT [facility] Provider Note   CSN: [id number] Arrival date & time: 05/22/23  1135     History  Chief Complaint  Patient presents with   Shoulder Injury    Amanda Bennett is a 67 y.o. female.  HPI   67 year old female presents emergency department with complaints of right shoulder pain.  Patient states that right shoulder pain began on Friday when she was attempting to start her lawn more with her right arm.  States that the same time, her dog was pulling her with her left arm.  Reports history of rotator cuff surgery on bilateral shoulders.  States it feels similar to symptoms she felt when she tore her rotator cuff before her for surgery on her right arm.  Reports pain with any range of motion of her right arm.  Reports some weakness in the shoulder but denies weakness/sensory deficits otherwise.  She also states that when she was walking her dog, her dog pulled her into a metal fence and she did left side of her ribs on the fence.  Concerned that she possibly could have broken a rib.  Has been taking Tylenol/Motrin for pain with some relief of symptoms.  Denies any direct trauma to right-sided shoulder.  Past medical history significant for chronic pain syndrome, COPD, GERD, prolonged QT, hypertension, CAD, osteoarthritis, carpal tunnel syndrome, myofascial pain dysfunction syndrome  Home Medications Prior to Admission medications   Medication Sig Start Date End Date Taking? Authorizing Provider  meloxicam (MOBIC) 15 MG tablet Take 1 tablet (15 mg total) by mouth daily as needed for pain. 05/22/23  Yes Sherian Maroon A, PA  oxyCODONE (ROXICODONE) 5 MG immediate release tablet Take 1 tablet (5 mg total) by mouth every 6 (six) hours as needed for severe pain or breakthrough pain. 05/22/23  Yes Sherian Maroon A, PA  ASPIRIN 81 PO Take by mouth daily.    [provider]  cholestyramine (QUESTRAN) 4 g packet Take 1 packet (4 g total) by  mouth daily with supper. 11/20/21   Iva Boop, MD  clonazePAM (KLONOPIN) 0.5 MG tablet Take 1 mg by mouth at bedtime. 11/08/15   [provider]  dicyclomine (BENTYL) 20 MG tablet Take 1 tablet (20 mg total) by mouth 2 (two) times daily. 07/06/22   Marita Kansas, PA-C  hydrocortisone (ANUSOL-HC) 2.5 % rectal cream Place 1 application. rectally 2 (two) times daily. 11/20/21   Iva Boop, MD  Lactase (LACTOSE INTOLERANCE PO) Take by mouth. One teaspoon three times daily before meals    [provider]  magic mouthwash (lidocaine, diphenhydrAMINE, alum & mag hydroxide) suspension Swish and spit 5 mLs 4 (four) times daily as needed for mouth pain. 05/09/22   Mardella Layman, MD  mirtazapine (REMERON) 45 MG tablet TAKE 1 TABLET BY MOUTH AT BEDTIME. 02/05/23   Iva Boop, MD  ondansetron (ZOFRAN-ODT) 4 MG disintegrating tablet Take 1 tablet (4 mg total) by mouth every 8 (eight) hours as needed for nausea or vomiting. 07/06/22   Marita Kansas, PA-C  promethazine-dextromethorphan (PROMETHAZINE-DM) 6.25-15 MG/5ML syrup Take 5 mLs by mouth 4 (four) times daily as needed for cough. 05/09/22   Mardella Layman, MD  valsartan (DIOVAN) 80 MG tablet Take 1 tablet (80 mg total) by mouth daily. 07/19/21   Alver Sorrow, NP      Allergies    Fluoxetine hcl, Lisinopril, Tramadol, Statins, and Hydrocodone    Review of Systems   Review of Systems  All other systems reviewed and are negative.   Physical Exam Updated Vital Signs BP 123/87 (BP Location: Left Arm)   Pulse 94   Temp 98.2 F (36.8 C) (Oral)   Resp 18   LMP 10/23/2002   SpO2 100%  Physical Exam Vitals and nursing note reviewed.  Constitutional:      General: She is not in acute distress.    Appearance: She is well-developed.  HENT:     Head: Normocephalic and atraumatic.  Eyes:     Conjunctiva/sclera: Conjunctivae normal.  Cardiovascular:     Rate and Rhythm: Normal rate and regular rhythm.     Heart sounds: No murmur  heard. Pulmonary:     Effort: Pulmonary effort is normal. No respiratory distress.     Breath sounds: Normal breath sounds.     Comments: Some tenderness to palpation of left lower ribs. Abdominal:     Palpations: Abdomen is soft.     Tenderness: There is no abdominal tenderness.  Musculoskeletal:        General: No swelling.     Cervical back: Neck supple.     Comments: Limited range of motion of right shoulder secondary to pain.  Inability to perform empty can/Jobe's test secondary to pain.  Liftoff positive.  Some tenderness of right proximal humerus.  Radial pulses 2+ bilaterally.  No overlying erythema, palpable fluctuance/induration.  No right upper extremity swelling.  No strength 5 out of 5 for elbow flexion/extension, grip bilaterally.  Skin:    General: Skin is warm and dry.     Capillary Refill: Capillary refill takes less than 2 seconds.  Neurological:     Mental Status: She is alert.  Psychiatric:        Mood and Affect: Mood normal.     ED Results / Procedures / Treatments   Labs (all labs ordered are listed, but only abnormal results are displayed) Labs Reviewed - No data to display  EKG None  Radiology DG Shoulder Right  Result Date: 05/22/2023 CLINICAL DATA:  Right shoulder pain after injury starting lawnmower several days ago. EXAM: RIGHT SHOULDER - 2+ VIEW COMPARISON:  None Available. FINDINGS: There is no evidence of fracture or dislocation. There is no evidence of arthropathy or other focal bone abnormality. Soft tissues are unremarkable. IMPRESSION: Negative. Electronically Signed   By: Lupita Raider M.D.   On: 05/22/2023 13:26   DG Ribs Unilateral W/Chest Left  Result Date: 05/22/2023 CLINICAL DATA:  Left rib pain after falling on metal fence EXAM: LEFT RIBS AND CHEST - 3+ VIEW COMPARISON:  05/09/2022 chest radiograph FINDINGS: No displaced fracture or other bone lesions are seen involving the ribs. There is no evidence of pneumothorax or pleural  effusion. Both lungs are clear. Heart size and mediastinal contours are within normal limits. Bilateral breast implants. IMPRESSION: No displaced rib fractures. Electronically Signed   By: Wiliam Ke M.D.   On: 05/22/2023 13:12    Procedures Procedures    Medications Ordered in ED Medications  morphine (PF) 4 MG/ML injection 4 mg (4 mg Intramuscular Given 05/22/23 1316)    ED Course/ Medical Decision Making/ A&P                                 Medical Decision Making Amount and/or Complexity of Data Reviewed Radiology: ordered.  Risk Prescription drug management.   This patient presents to the ED for concern of shoulder pain,  this involves an extensive number of treatment options, and is a complaint that carries with it a high risk of complications and morbidity.  The differential diagnosis includes fracture, strain/pain, dislocation, ligamentous/tendinous injury, neurovascular compromise   Co morbidities that complicate the patient evaluation  See HPI   Additional history obtained:  Additional history obtained from EMR External records from outside source obtained and reviewed including hospital records   Lab Tests:  N/a   Imaging Studies ordered:  I ordered imaging studies including x-ray right shoulder, chest x-ray with left ribs I independently visualized and interpreted imaging which showed  Right shoulder x-ray: No acute abnormalities Chest with left ribs: No acute abnormalities I agree with the radiologist interpretation   Cardiac Monitoring: / EKG:  The patient was maintained on a cardiac monitor.  I personally viewed and interpreted the cardiac monitored which showed an underlying rhythm of: Sinus rhythm   Consultations Obtained:  N/a   Problem List / ED Course / Critical interventions / Medication management  Right shoulder pain, left rib pain Reevaluation of the patient showed that the patient stayed the same I have reviewed the patients  home medicines and have made adjustments as needed   Social Determinants of Health:  Former cigarette use.  Denies illicit drug use.   Test / Admission - Considered:  Right shoulder pain, left rib pain Vitals signs within normal range and stable throughout visit. Imaging studies significant for: See above 67 year old female presents emergency department with a few days of right-sided shoulder pain as well as left rib pain.  On exam, patient with some tenderness to proximal humerus but pain mostly elicited with range of motion of shoulder.  Per right shoulder special test, concern for underlying rotator cuff pathology.  X-rays were obtained which are negative for any acute osseous abnormality but still concern for rotator cuff pathology.  No evidence of pulse deficits concerning for ischemic limb.  No overlying secondary skin changes concerning for infectious process.  No extremity swelling concerning for DVT.  Will recommend follow-up with orthopedics in the outpatient setting.  Will recommend rest, ice, NSAIDs.  Regarding left rib pain, some overlying tenderness to palpation but without obvious evidence of rib fracture on imaging study.  Recommend supportive therapy as described in AVS and follow-up with primary care.  Treatment plan discussed at length with patient and she acknowledged understanding was agreeable to said plan.  Patient overall well-appearing, afebrile in no acute distress. Worrisome signs and symptoms were discussed with the patient, and the patient acknowledged understanding to return to the ED if noticed. Patient was stable upon discharge.          Final Clinical Impression(s) / ED Diagnoses Final diagnoses:  Acute pain of right shoulder  Rib pain on left side    Rx / DC Orders ED Discharge Orders          Ordered    ketorolac (TORADOL) 10 MG tablet  Every 6 hours PRN,   Status:  Discontinued        05/22/23 1349    meloxicam (MOBIC) 15 MG tablet  Daily PRN         05/22/23 1358    oxyCODONE (ROXICODONE) 5 MG immediate release tablet  Every 6 hours PRN        05/22/23 1358              Peter Garter, [place] 05/22/23 1410    Jacalyn Lefevre, MD 05/22/23 [phone]

## 2023-05-22 NOTE — ED Triage Notes (Signed)
Injured her right shoulder on Friday while pulling the starter on a lawn mower. Her dog also pulled her and she hit her left side on metal fence and thinks she may have broken some ribs.

## 2023-05-22 NOTE — Discharge Instructions (Signed)
As discussed, x-rays of your left ribs as well as your right shoulder were negative for any fracture or dislocation.  Regarding right shoulder, will use sling as needed to help aid in relief but please do not keep your shoulder immobilized due to concern for development of compartment syndrome.  Will send in medication for pain to take as needed.  Recommend calling number attached to discharge papers to set up an appointment with Delbert Harness.  Please do not hesitate to return to emergency department for worrisome signs and symptoms we discussed become apparent.

## 2023-05-23 DIAGNOSIS — M25511 Pain in right shoulder: Secondary | ICD-10-CM | POA: Diagnosis not present

## 2023-05-31 DIAGNOSIS — M25511 Pain in right shoulder: Secondary | ICD-10-CM | POA: Diagnosis not present

## 2023-05-31 DIAGNOSIS — S46811A Strain of other muscles, fascia and tendons at shoulder and upper arm level, right arm, initial encounter: Secondary | ICD-10-CM | POA: Diagnosis not present

## 2023-05-31 DIAGNOSIS — M7581 Other shoulder lesions, right shoulder: Secondary | ICD-10-CM | POA: Diagnosis not present

## 2023-05-31 DIAGNOSIS — M25411 Effusion, right shoulder: Secondary | ICD-10-CM | POA: Diagnosis not present

## 2023-05-31 DIAGNOSIS — S46211A Strain of muscle, fascia and tendon of other parts of biceps, right arm, initial encounter: Secondary | ICD-10-CM | POA: Diagnosis not present

## 2023-06-10 DIAGNOSIS — I2510 Atherosclerotic heart disease of native coronary artery without angina pectoris: Secondary | ICD-10-CM | POA: Diagnosis not present

## 2023-06-11 ENCOUNTER — Ambulatory Visit: Payer: Medicare HMO | Admitting: Cardiovascular Disease

## 2023-06-11 LAB — LAB REPORT - SCANNED: EGFR: 86

## 2023-06-12 DIAGNOSIS — M25511 Pain in right shoulder: Secondary | ICD-10-CM | POA: Diagnosis not present

## 2023-06-13 DIAGNOSIS — F339 Major depressive disorder, recurrent, unspecified: Secondary | ICD-10-CM | POA: Diagnosis not present

## 2023-06-13 DIAGNOSIS — K219 Gastro-esophageal reflux disease without esophagitis: Secondary | ICD-10-CM | POA: Diagnosis not present

## 2023-06-13 DIAGNOSIS — Z0000 Encounter for general adult medical examination without abnormal findings: Secondary | ICD-10-CM | POA: Diagnosis not present

## 2023-06-13 DIAGNOSIS — I700 Atherosclerosis of aorta: Secondary | ICD-10-CM | POA: Diagnosis not present

## 2023-06-13 DIAGNOSIS — F5104 Psychophysiologic insomnia: Secondary | ICD-10-CM | POA: Diagnosis not present

## 2023-06-13 DIAGNOSIS — I2510 Atherosclerotic heart disease of native coronary artery without angina pectoris: Secondary | ICD-10-CM | POA: Diagnosis not present

## 2023-06-13 DIAGNOSIS — M810 Age-related osteoporosis without current pathological fracture: Secondary | ICD-10-CM | POA: Diagnosis not present

## 2023-06-13 DIAGNOSIS — Z23 Encounter for immunization: Secondary | ICD-10-CM | POA: Diagnosis not present

## 2023-06-14 DIAGNOSIS — M25511 Pain in right shoulder: Secondary | ICD-10-CM | POA: Diagnosis not present

## 2023-06-27 ENCOUNTER — Telehealth: Payer: Self-pay | Admitting: *Deleted

## 2023-06-27 NOTE — Telephone Encounter (Signed)
Transition Care Management Unsuccessful Follow-up Telephone Call  Date of discharge and from where:  [facility]  05/22/2023  Attempts:  1st Attempt  Reason for unsuccessful TCM follow-up call:  Left voice message

## 2023-06-28 ENCOUNTER — Encounter: Payer: Self-pay | Admitting: Cardiovascular Disease

## 2023-06-28 ENCOUNTER — Ambulatory Visit: Payer: Medicare HMO | Attending: Cardiovascular Disease | Admitting: Cardiovascular Disease

## 2023-06-28 VITALS — BP 108/60 | HR 73 | Ht 67.0 in | Wt 135.8 lb

## 2023-06-28 DIAGNOSIS — R002 Palpitations: Secondary | ICD-10-CM | POA: Diagnosis not present

## 2023-06-28 NOTE — Progress Notes (Signed)
Electrophysiology Office Note:    Date:  06/28/2023   ID:  Amanda Bennett, DOB 1955-10-01, MRN [id number]  PCP:  Merri Brunette, MD   [facility] Providers Cardiologist:  Lorine Bears, MD     Referring MD: Merri Brunette, MD   History of Present Illness:    Amanda Bennett is a 67 y.o. female with a medical history significant for COPD, referred for management of arrhythmia.     A monitor was placed after an episode of syncope. 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 Additionally, there were brief episodes of SVT lasting up to 12.5 seconds.     She has been having frequent episodes of dizziness and lightheadedness.  These are oftentimes associated with visions of flashing lights.  She has had multiple episodes syncope over the years.  She did not have any syncope and does not recall having lightheadedness while wearing her monitor.  She presents today for placement of a loop recorder.  EKGs/Labs/Other Studies Reviewed Today:    Echocardiogram:  TTE 04/10/2023 EF 60-65%, Gr I diastolic dysfunction. No significant valvular disease   Monitors:  Zio monitor - 13 days, July 2024 - my interpretation Sinus rhythm HR 22-139 bpm, avg 84 Multiple nocturnal pauses noted with high grade AV block and sinus node dysfunction  Stress testing:   Advanced imaging:   Cardiac catherization    EKG:   EKG Interpretation Date/Time:  Friday June 28 2023 16:06:30 EDT Ventricular Rate:  73 PR Interval:  180 QRS Duration:  100 QT Interval:  410 QTC Calculation: 451 R Axis:   65  Text Interpretation: Normal sinus rhythm Normal ECG When compared with ECG of 29-Apr-2023 13:51, No significant change was found Confirmed by York Pellant [phone]) on 06/28/2023 4:11:30 PM     Physical Exam:    VS:  BP 108/60   Pulse 73   Ht 5\' 7"  (1.702 m)   Wt 135 lb 12.8 oz (61.6 kg)   LMP 10/23/2002   SpO2 98%    BMI 21.27 kg/m     Wt Readings from Last 3 Encounters:  06/28/23 135 lb 12.8 oz (61.6 kg)  05/14/23 134 lb 12.8 oz (61.1 kg)  04/29/23 133 lb 9.6 oz (60.6 kg)     GEN:  Well nourished, well developed in no acute distress CARDIAC: RRR, no murmurs, rubs, gallops RESPIRATORY:  Normal work of breathing MUSCULOSKELETAL: no edema    ASSESSMENT & PLAN:    Nocturnal pauses Not a good indication for pacemaker Pt will need sleep study to evaluate for OSA as possible reversible cause for pauses  Syncope Symptoms are a little atypical for arrhythmogenic syncope though rhythm abnormality cannot be excluded based upon history I recommended a loop recorder to assess her rhythm definitively. I explained the rationale and logistics for the loop recorder placement including risks of minor bleeding and infection.  I explained the monitoring process with the associated monthly charge.  SVT Episodes detected on her monitor were brief We will monitor with loop recorder  SURGEON:  Maurice Small, MD     PREPROCEDURE DIAGNOSIS:  Syncope    POSTPROCEDURE DIAGNOSIS: Syncope     PROCEDURES:   1. Implantable loop recorder implantation    INTRODUCTION:  Amanda Bennett presents with a history of syncope The costs of loop recorder monitoring have been discussed with the patient.    DESCRIPTION OF PROCEDURE:  Informed written consent was obtained.  A timeout was performed. The patient required no sedation for the procedure today. The patients left chest was prepped and draped in the usual sterile fashion. The skin overlying the left parasternal region was infiltrated with lidocaine for local analgesia.  A 0.5-cm incision was made over the left parasternal region over the 3rd intercostal space.  A subcutaneous ILR pocket was fashioned using a combination of sharp and blunt dissection.  A Abbott Assert IQ EL+ implantable loop recorder (SN [id number]) was then placed into the pocket  R waves were very  prominent and measured >0.23mV.  Steri- Strips and a sterile dressing were then applied.  There were no early apparent complications.     CONCLUSIONS:   1. Successful implantation of a implantable loop recorder for a history of recurrent syncope  2. No early apparent complications.   Maurice Small, MD  Cardiac Electrophysiology     Signed, Maurice Small, MD  06/28/2023 4:33 PM    [facility]

## 2023-06-28 NOTE — Patient Instructions (Signed)
Medication Instructions:  Your physician recommends that you continue on your current medications as directed. Please refer to the Current Medication list given to you today.  Labwork: None ordered.  Testing/Procedures: None ordered.  Follow-Up:  Your physician wants you to follow-up in: one year with Dr. Augustus Mealor.  You will receive a reminder letter in the mail two months in advance. If you don't receive a letter, please call our office to schedule the follow-up appointment.  Implantable Loop Recorder Placement, Care After This sheet gives you information about how to care for yourself after your procedure. Your health care provider may also give you more specific instructions. If you have problems or questions, contact your health care provider.  What can I expect after the procedure? After the procedure, it is common to have: Soreness or discomfort near the incision. Some swelling or bruising near the incision.  Follow these instructions at home: Incision care  Monitor your cardiac device site for redness, swelling, and drainage. Call the device clinic at [phone] if you experience these symptoms or fever/chills.  Keep the large square bandage on your site for 24 hours and then you may remove it yourself. Keep the steri-strips underneath in place.   You may shower after 72 hours / 3 days from your procedure with the steri-strips in place. They will usually fall off on their own, or may be removed after 10 days. Pat dry.   Avoid lotions, ointments, or perfumes over your incision until it is well-healed.  Please do not submerge in water until your site is completely healed.   Your device is MRI compatible.   Remote monitoring is used to monitor your cardiac device from home. This monitoring is scheduled every month by our office. It allows us to keep an eye on the function of your device to ensure it is working properly.  If your wound site starts to bleed apply  pressure.    For help with the monitor please call [employer] Monitor Support Specialist directly at [phone].    If you have any questions/concerns please call the device clinic at [phone].  Activity  Return to your normal activities.  General instructions Follow instructions from your health care provider about how to manage your implantable loop recorder and transmit the information. Learn how to activate a recording if this is necessary for your type of device. You may go through a metal detection gate, and you may let someone hold a metal detector over your chest. Show your ID card if needed. Do not have an MRI unless you check with your health care provider first. Take over-the-counter and prescription medicines only as told by your health care provider. Keep all follow-up visits as told by your health care provider. This is important. Contact a health care provider if: You have redness, swelling, or pain around your incision. You have a fever. You have pain that is not relieved by your pain medicine. You have triggered your device because of fainting (syncope) or because of a heartbeat that feels like it is racing, slow, fluttering, or skipping (palpitations). Get help right away if you have: Chest pain. Difficulty breathing. Summary After the procedure, it is common to have soreness or discomfort near the incision. Change your dressing as told by your health care provider. Follow instructions from your health care provider about how to manage your implantable loop recorder and transmit the information. Keep all follow-up visits as told by your health care provider. This is important. This information   is not intended to replace advice given to you by your health care provider. Make sure you discuss any questions you have with your health care provider. Document Released: 08/08/2015 Document Revised: 10/12/2017 Document Reviewed: 10/12/2017 Elsevier Patient Education   2020 Elsevier Inc.  

## 2023-07-17 DIAGNOSIS — M25511 Pain in right shoulder: Secondary | ICD-10-CM | POA: Diagnosis not present

## 2023-07-18 ENCOUNTER — Telehealth: Payer: Self-pay

## 2023-07-18 NOTE — Telephone Encounter (Signed)
ILR alert for symptom, "fast HR", EGM shows ST 100-110 There are 12 logged pause events 3-9sec in duration, EGM's show heart block - route to triage high alert per protocol No alerts programmed on, symptom only, changed programming to syncope protocol LA, CVRS.  ILR implanted for syncope on 06/28/23. First reported events.  LM for patient to assess for symptoms, suspect the 9 sec pauses are nocturnal.   Forwarding to Dr. Nelly Laurence for review and recommendations as well.                      Marland Kitchen

## 2023-07-22 NOTE — Telephone Encounter (Signed)
ILR alert for pause Event occurred 11/9 @ 10:41, 3sec in duration, EGM appears heart block  Daytime pause.  Need to assess symptoms.

## 2023-07-23 NOTE — Telephone Encounter (Signed)
Pt symptomatic. Near syncopal. Lightheaded dizzy and had to squat chest to knees until feeling passed.

## 2023-07-24 NOTE — Telephone Encounter (Addendum)
Daytime pause event 3 sec on 11/9 (see episode below).   Pt symptomatic. Near syncopal. Lightheaded dizzy and had to squat chest to knees until feeling passed.   Forwarding to Dr. Nelly Laurence for review.  Patient has no symptoms following event.

## 2023-07-30 ENCOUNTER — Ambulatory Visit (HOSPITAL_BASED_OUTPATIENT_CLINIC_OR_DEPARTMENT_OTHER): Payer: Medicare HMO | Attending: Cardiovascular Disease | Admitting: Cardiology

## 2023-07-30 VITALS — Ht 64.0 in | Wt 135.0 lb

## 2023-07-30 DIAGNOSIS — G4733 Obstructive sleep apnea (adult) (pediatric): Secondary | ICD-10-CM | POA: Insufficient documentation

## 2023-07-30 DIAGNOSIS — R0683 Snoring: Secondary | ICD-10-CM | POA: Diagnosis not present

## 2023-07-30 DIAGNOSIS — R5383 Other fatigue: Secondary | ICD-10-CM | POA: Insufficient documentation

## 2023-08-01 ENCOUNTER — Ambulatory Visit: Payer: Medicare HMO | Attending: Cardiovascular Disease | Admitting: Cardiovascular Disease

## 2023-08-01 ENCOUNTER — Encounter: Payer: Self-pay | Admitting: Cardiovascular Disease

## 2023-08-01 VITALS — BP 110/50 | HR 70 | Ht 64.0 in | Wt 136.8 lb

## 2023-08-01 DIAGNOSIS — I441 Atrioventricular block, second degree: Secondary | ICD-10-CM

## 2023-08-01 DIAGNOSIS — I442 Atrioventricular block, complete: Secondary | ICD-10-CM | POA: Diagnosis not present

## 2023-08-01 DIAGNOSIS — I471 Supraventricular tachycardia, unspecified: Secondary | ICD-10-CM | POA: Diagnosis not present

## 2023-08-01 DIAGNOSIS — R002 Palpitations: Secondary | ICD-10-CM | POA: Diagnosis not present

## 2023-08-01 NOTE — Progress Notes (Signed)
Electrophysiology Office Note:    Date:  08/01/2023   ID:  Amanda Bennett, DOB 04-Sep-1956, MRN [id number]  PCP:  Merri Brunette, MD   [facility] Providers Cardiologist:  Lorine Bears, MD Electrophysiologist:  Maurice Small, MD     Referring MD: Merri Brunette, MD   History of Present Illness:    Amanda Bennett is a 67 y.o. female with a medical history significant for COPD, referred for management of arrhythmia.     A monitor was placed after an episode of syncope. 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 Additionally, there were brief episodes of SVT lasting up to 12.5 seconds.     She has been having frequent episodes of dizziness and lightheadedness.  These are oftentimes associated with visions of flashing lights.  She has had multiple episodes syncope over the years.  She did not have any syncope and does not recall having lightheadedness while wearing her monitor.  A loop recorder was placed June 28, 2023.  Since, she has had 2 symptomatic episodes detected on her loop recorder.  These correlated with patient triggered events for near syncope.  1 event correlated with normal sinus rhythm and normal AV conduction.  With the second episode, there was a 3-second episode of AV block.  The symptoms were associated with nausea and flushing.  EKGs/Labs/Other Studies Reviewed Today:    Echocardiogram:  TTE 04/10/2023 EF 60-65%, Gr I diastolic dysfunction. No significant valvular disease   Monitors:  Zio monitor - 13 days, July 2024 - my interpretation Sinus rhythm HR 22-139 bpm, avg 84 Multiple nocturnal pauses noted with high grade AV block and sinus node dysfunction  Stress testing:   Advanced imaging:   Cardiac catherization    EKG:   EKG Interpretation Date/Time:  Thursday August 01 2023 15:46:45 EST Ventricular Rate:  70 PR Interval:  174 QRS Duration:  102 QT  Interval:  414 QTC Calculation: 447 R Axis:   58  Text Interpretation: Normal sinus rhythm Incomplete right bundle branch block When compared with ECG of 28-Jun-2023 16:06, No significant change was found Confirmed by York Pellant [phone]) on 08/01/2023 4:03:17 PM     Physical Exam:    VS:  BP (!) 110/50 (BP Location: Left Arm, Patient Position: Sitting, Cuff Size: Normal)   Pulse 70   Ht 5\' 4"  (1.626 m)   Wt 136 lb 12.8 oz (62.1 kg)   LMP 10/23/2002   SpO2 97%   BMI 23.48 kg/m     Wt Readings from Last 3 Encounters:  08/01/23 136 lb 12.8 oz (62.1 kg)  07/30/23 135 lb (61.2 kg)  06/28/23 135 lb 12.8 oz (61.6 kg)     GEN:  Well nourished, well developed in no acute distress CARDIAC: RRR, no murmurs, rubs, gallops RESPIRATORY:  Normal work of breathing MUSCULOSKELETAL: no edema    ASSESSMENT & PLAN:    Nocturnal pauses Not a good indication for pacemaker Pt has completed a sleep study and is awaiting results  Syncope Symptoms are a little atypical for arrhythmogenic syncope  2 symptomatic episodes detected recently --1 correlated with a 3-second episode of high-grade AV block, the other showed normal rhythm and conduction.  Symptomatically, the episodes were identical Associated symptoms of flushing, nausea, and the occurrence of syncope with normal rhythm suggest neurocardiogenic etiology with a large vasodepressor component  we will continue to monitor  SVT Episodes detected on her monitor were brief  We will monitor with loop recorder      Signed, Maurice Small, MD  08/01/2023 4:25 PM    [facility]

## 2023-08-01 NOTE — Patient Instructions (Signed)
Medication Instructions:  Your physician recommends that you continue on your current medications as directed. Please refer to the Current Medication list given to you today. *If you need a refill on your cardiac medications before your next appointment, please call your pharmacy*   Follow-Up: At [facility], you and your health needs are our priority.  As part of our continuing mission to provide you with exceptional heart care, we have created designated Provider Care Teams.  These Care Teams include your primary Cardiologist (physician) and Advanced Practice Providers (APPs -  Physician Assistants and Nurse Practitioners) who all work together to provide you with the care you need, when you need it.  We recommend signing up for the patient portal called "MyChart".  Sign up information is provided on this After Visit Summary.  MyChart is used to connect with patients for Virtual Visits (Telemedicine).  Patients are able to view lab/test results, encounter notes, upcoming appointments, etc.  Non-urgent messages can be sent to your provider as well.   To learn more about what you can do with MyChart, go to [email or web address].    Your next appointment:   6 month(s)  Provider:   Augustus Mealor, MD  

## 2023-08-02 ENCOUNTER — Ambulatory Visit (INDEPENDENT_AMBULATORY_CARE_PROVIDER_SITE_OTHER): Payer: Medicare HMO

## 2023-08-02 DIAGNOSIS — R55 Syncope and collapse: Secondary | ICD-10-CM | POA: Diagnosis not present

## 2023-08-02 LAB — CUP PACEART REMOTE DEVICE CHECK
Date Time Interrogation Session: 20241122020842
Implantable Pulse Generator Implant Date: 20241018
Pulse Gen Serial Number: 511035142

## 2023-08-07 NOTE — Procedures (Signed)
   Patient Name: Amanda Bennett, Amanda Bennett Date: 07/30/2023 Gender: Female D.O.B: [birth year] Age (years): 39 Referring Provider: Ronnald Ramp ONeal Height (inches): 64 Interpreting Physician: Armanda Magic MD, ABSM Weight (lbs): 133 RPSGT: Elaina Pattee BMI: 23 MRN: [id number]  CLINICAL INFORMATION Sleep Study Type: HST  Indication for sleep study: N/A  Epworth Sleepiness Score: N/A  SLEEP STUDY TECHNIQUE A multi-channel overnight portable sleep study was performed. The channels recorded were: nasal airflow, thoracic respiratory movement, and oxygen saturation with a pulse oximetry. Snoring was also monitored.  MEDICATIONS Patient self administered medications include: N/A.  SLEEP ARCHITECTURE Patient was studied for 496.9 minutes. The sleep efficiency was 100.0 % and the patient was supine for 0%. The arousal index was 0.0 per hour.  RESPIRATORY PARAMETERS The overall AHI was 17.0 per hour, with a central apnea index of 0 per hour.  The oxygen nadir was 79% during sleep.  CARDIAC DATA Mean heart rate during sleep was 63.4 bpm.  IMPRESSIONS - Moderate obstructive sleep apnea occurred during this study (AHI = 17.0/h). - Severe oxygen desaturation was noted during this study (Min O2 = 79%). - Patient snored 8.1% during the sleep.  DIAGNOSIS - Obstructive Sleep Apnea (G47.33)  RECOMMENDATIONS - Recommend CPAP titration in lab. - Avoid alcohol, sedatives and other CNS depressants that may worsen sleep apnea and disrupt normal sleep architecture. - Sleep hygiene should be reviewed to assess factors that may improve sleep quality. - Weight management and regular exercise should be initiated or continued.  [Electronically signed] 08/07/2023 10:29 PM  Armanda Magic MD, ABSM Diplomate, American Board of Sleep Medicine

## 2023-08-12 ENCOUNTER — Telehealth: Payer: Self-pay | Admitting: *Deleted

## 2023-08-12 DIAGNOSIS — R0683 Snoring: Secondary | ICD-10-CM

## 2023-08-12 DIAGNOSIS — G4733 Obstructive sleep apnea (adult) (pediatric): Secondary | ICD-10-CM

## 2023-08-12 DIAGNOSIS — I10 Essential (primary) hypertension: Secondary | ICD-10-CM

## 2023-08-12 NOTE — Telephone Encounter (Signed)
The patient has been notified of the result and verbalized understanding.  All questions (if any) were answered. Latrelle Dodrill, CMA 08/12/2023 6:04 PM    Precert titration

## 2023-08-12 NOTE — Telephone Encounter (Signed)
-----   Message from Armanda Magic sent at 08/07/2023 10:30 PM EST ----- Please let patient know that they have sleep apnea.  Recommend therapeutic CPAP titration for treatment of patient's sleep disordered breathing.  If unable to perform an in lab titration then initiate ResMed auto CPAP from 4 to 15cm H2O with heated humidity and mask of choice and overnight pulse ox on CPAP.

## 2023-08-22 ENCOUNTER — Telehealth: Payer: Self-pay | Admitting: Cardiovascular Disease

## 2023-08-22 NOTE — Progress Notes (Signed)
Carelink Summary Report / Loop Recorder 

## 2023-08-22 NOTE — Telephone Encounter (Signed)
  Patient is calling to find out where we are with getting her a CPAP machine. Please advise.

## 2023-08-22 NOTE — Telephone Encounter (Signed)
Patient was ordered a cpap titration befoe getting a machine. Will notify the patient. Pt is  agreeable to treatment.

## 2023-09-02 ENCOUNTER — Ambulatory Visit (INDEPENDENT_AMBULATORY_CARE_PROVIDER_SITE_OTHER): Payer: Medicare HMO

## 2023-09-02 DIAGNOSIS — R55 Syncope and collapse: Secondary | ICD-10-CM | POA: Diagnosis not present

## 2023-09-02 LAB — CUP PACEART REMOTE DEVICE CHECK
Date Time Interrogation Session: 20241223080240
Implantable Pulse Generator Implant Date: 20241018
Pulse Gen Serial Number: 511035142

## 2023-09-06 NOTE — Telephone Encounter (Signed)
Pt called in asking about her cpap machine

## 2023-09-17 NOTE — Telephone Encounter (Signed)
 Call to patient to discuss request for Lunesta . Patient states she has not taken any Remeron  or Ambien  for sleep for 3 weeks as her copay for these meds increased and she cannot afford them right now. She is changing insurances in February and may be able to resume them at that time. She is requesting one time dose of lunesta  for her cpap titration only. Forwarded to Dr. Shlomo.

## 2023-09-20 MED ORDER — ESZOPICLONE 2 MG PO TABS: 2.0000 mg | ORAL_TABLET | Freq: Once | ORAL | 0 refills | Status: DC

## 2023-09-20 NOTE — Addendum Note (Signed)
 Addended by: Luellen Pucker on: 09/20/2023 02:26 PM   Modules accepted: Orders

## 2023-09-20 NOTE — Telephone Encounter (Signed)
 Call to patient to advise that Dr. Shlomo will order Lunesta  2mg  (disp #1 tablet with 0 refills) to take at sleep lab. Patient verbalizes understanding that she will receive one tablet with no refills, one time dose to be taken prior to sleep study, which has not been scheduled yet. Forwarded message to sleep studies pool/sleep center to ask when sleep study can be scheduled.

## 2023-09-24 ENCOUNTER — Telehealth: Payer: Self-pay | Admitting: *Deleted

## 2023-09-24 NOTE — Telephone Encounter (Signed)
 Lmtcb with new insurance info.

## 2023-09-25 ENCOUNTER — Telehealth: Payer: Self-pay | Admitting: Cardiovascular Disease

## 2023-09-25 NOTE — Telephone Encounter (Signed)
 Pt is returning call to a nurse

## 2023-09-25 NOTE — Telephone Encounter (Signed)
 RETURNED CALL: Called the patient back and she states she will have new insurance [facility]) on February 1st.

## 2023-09-25 NOTE — Telephone Encounter (Signed)
 1/15  Patient has to wait until she has new insurance. NEW INSURANCE GOES INTO EFFECT 10/12/23 WILL HAVE [employer]

## 2023-10-01 ENCOUNTER — Telehealth: Payer: Self-pay

## 2023-10-01 NOTE — Telephone Encounter (Signed)
ILR alert for pause Event occurred 1/20 @ 11:46, 4sec per device EGM c/w heart block - route to triage for daytime hours event  11/21 OV note by Dr. Nelly Laurence states that we will continue to monitor for daytime pause sx.  This is a known hx of daytime 3 sec pauses w/HB.   LM on patients VM to call us back with sx.

## 2023-10-01 NOTE — Telephone Encounter (Signed)
Attempted to contact patient, left voicemail to call our office back

## 2023-10-01 NOTE — Telephone Encounter (Signed)
Patient reports that she was awake during daytime 4 sec pause and was very sx:  Dizzy, weak, lights flashing before eye, nearly lost consciousness (but didn't).    Reports having a 2nd episode yesterday evening but much shorter.  Device did not record anything.   Patient has ER precautions and knows to call us as well if sx's again in future. She is still trying to get CPAP approved with insurance, hasn't started yet.

## 2023-10-03 ENCOUNTER — Ambulatory Visit (INDEPENDENT_AMBULATORY_CARE_PROVIDER_SITE_OTHER): Payer: Medicare HMO

## 2023-10-03 DIAGNOSIS — R55 Syncope and collapse: Secondary | ICD-10-CM

## 2023-10-03 NOTE — Telephone Encounter (Signed)
Spoke with patient, appointment scheduled for 11/08/23 - Patient wanted to wait until her new insurance began.

## 2023-10-03 NOTE — Telephone Encounter (Signed)
Attempted to contact patient to schedule a follow up with Dr Nelly Laurence - left voicemail to call our office back.

## 2023-10-07 ENCOUNTER — Encounter: Payer: Self-pay | Admitting: Cardiovascular Disease

## 2023-10-07 LAB — CUP PACEART REMOTE DEVICE CHECK
Date Time Interrogation Session: 20250123080110
Implantable Pulse Generator Implant Date: 20241018
Pulse Gen Serial Number: 511035142

## 2023-10-09 NOTE — Progress Notes (Signed)
[employer]

## 2023-10-22 DIAGNOSIS — M810 Age-related osteoporosis without current pathological fracture: Secondary | ICD-10-CM | POA: Diagnosis not present

## 2023-10-24 NOTE — Telephone Encounter (Signed)
Prior Authorization for CPAP TITRAION sent to [facility] via web portal. Tracking Number . Decision ID #: [id number] Authorization/Notification is not required for the requested service

## 2023-10-24 NOTE — Telephone Encounter (Signed)
Decision ID #: [id number] Authorization/Notification is not required for the requested service

## 2023-10-31 ENCOUNTER — Encounter (HOSPITAL_COMMUNITY): Payer: Self-pay | Admitting: Emergency Medicine

## 2023-10-31 ENCOUNTER — Telehealth: Payer: Self-pay | Admitting: Internal Medicine

## 2023-10-31 ENCOUNTER — Emergency Department (HOSPITAL_COMMUNITY)
Admission: EM | Admit: 2023-10-31 | Discharge: 2023-11-01 | Disposition: A | Discharge status: Home / Self Care | Payer: 59 | Attending: Emergency Medicine | Admitting: Emergency Medicine

## 2023-10-31 DIAGNOSIS — R1084 Generalized abdominal pain: Secondary | ICD-10-CM | POA: Diagnosis not present

## 2023-10-31 DIAGNOSIS — K6289 Other specified diseases of anus and rectum: Secondary | ICD-10-CM | POA: Diagnosis not present

## 2023-10-31 DIAGNOSIS — R109 Unspecified abdominal pain: Secondary | ICD-10-CM | POA: Insufficient documentation

## 2023-10-31 DIAGNOSIS — Z5321 Procedure and treatment not carried out due to patient leaving prior to being seen by health care provider: Secondary | ICD-10-CM | POA: Insufficient documentation

## 2023-10-31 DIAGNOSIS — R197 Diarrhea, unspecified: Secondary | ICD-10-CM | POA: Diagnosis not present

## 2023-10-31 DIAGNOSIS — Z7982 Long term (current) use of aspirin: Secondary | ICD-10-CM | POA: Diagnosis not present

## 2023-10-31 DIAGNOSIS — R110 Nausea: Secondary | ICD-10-CM | POA: Diagnosis not present

## 2023-10-31 LAB — COMPREHENSIVE METABOLIC PANEL
ALT: 14 U/L (ref 0–44)
AST: 22 U/L (ref 15–41)
Albumin: 4.4 g/dL (ref 3.5–5.0)
Alkaline Phosphatase: 84 U/L (ref 38–126)
Anion gap: 8 (ref 5–15)
BUN: 12 mg/dL (ref 8–23)
CO2: 21 mmol/L — ABNORMAL LOW (ref 22–32)
Calcium: 9.2 mg/dL (ref 8.9–10.3)
Chloride: 107 mmol/L (ref 98–111)
Creatinine, Ser: 0.62 mg/dL (ref 0.44–1.00)
GFR, Estimated: >60 mL/min (ref >60)
Glucose, Bld: 119 mg/dL — ABNORMAL HIGH (ref 70–99)
Potassium: 4.3 mmol/L (ref 3.5–5.1)
Sodium: 136 mmol/L (ref 135–145)
Total Bilirubin: 0.6 mg/dL (ref 0.0–1.2)
Total Protein: 8.1 g/dL (ref 6.5–8.1)

## 2023-10-31 LAB — URINALYSIS, ROUTINE W REFLEX MICROSCOPIC
Bilirubin Urine: NEGATIVE
Glucose, UA: NEGATIVE mg/dL
Hgb urine dipstick: NEGATIVE
Ketones, ur: NEGATIVE mg/dL
Leukocytes,Ua: NEGATIVE
Nitrite: NEGATIVE
Protein, ur: NEGATIVE mg/dL
Specific Gravity, Urine: 1.014 (ref 1.005–1.030)
pH: 5 (ref 5.0–8.0)

## 2023-10-31 LAB — CBC
HCT: 41.2 % (ref 36.0–46.0)
Hemoglobin: 13.9 g/dL (ref 12.0–15.0)
MCH: 32.3 pg (ref 26.0–34.0)
MCHC: 33.7 g/dL (ref 30.0–36.0)
MCV: 95.6 fL (ref 80.0–100.0)
Platelets: 336 10*3/uL (ref 150–400)
RBC: 4.31 MIL/uL (ref 3.87–5.11)
RDW: 12.8 % (ref 11.5–15.5)
WBC: 8.9 10*3/uL (ref 4.0–10.5)
nRBC: 0 % (ref 0.0–0.2)

## 2023-10-31 LAB — LIPASE, BLOOD: Lipase: 34 U/L (ref 11–51)

## 2023-10-31 NOTE — Telephone Encounter (Signed)
Scheduled patient for tomorrow at 11:00 with Victorino Dike, [place]. She's currently at [facility], and will call back in the AM to cancel if needed.

## 2023-10-31 NOTE — Telephone Encounter (Signed)
There are APP openings tomorrow - see if she will take one  Victorino Dike is going to be in my pod so would schedule w/ her if that works

## 2023-10-31 NOTE — Telephone Encounter (Signed)
Inbound call from patient stating that she has " massive infection" in her stomach of diverticulitis. Patient is requesting a call to discuss. Please advise.

## 2023-10-31 NOTE — Telephone Encounter (Signed)
Returned patient call & says she "feels like her stomach is infected." She's had abdominal cramping & gas for the last month, but just recently in the last 3 days has been experiencing nausea w/o vomiting & frequent loose stools after every meals (says 20-30/day). Stools are clear/yellow, loose. Small amount of bleeding noted yesterday after a bowel movement. Abdomen is cramping, sore to the touch. Denies fever. Takes cholestyramine daily & has increased dicyclomine to every 4 hours. Last seen for OV with Dr. Leone Payor 01/2022. Advised her to drink plenty of fluids in the meantime & will have Dr. Leone Payor review.

## 2023-10-31 NOTE — ED Triage Notes (Signed)
Pt here from home with c/o abd pain and loose stools has a call out to her Gi MD but deiced to come to the ED , pt is on day 4 of antibiotics

## 2023-11-01 ENCOUNTER — Encounter (HOSPITAL_COMMUNITY): Payer: Self-pay | Admitting: Emergency Medicine

## 2023-11-01 ENCOUNTER — Encounter: Payer: Self-pay | Admitting: Physician Assistant

## 2023-11-01 ENCOUNTER — Ambulatory Visit: Payer: 59 | Admitting: Physician Assistant

## 2023-11-01 ENCOUNTER — Other Ambulatory Visit: Payer: Self-pay

## 2023-11-01 ENCOUNTER — Emergency Department (HOSPITAL_COMMUNITY)
Admission: EM | Admit: 2023-11-01 | Discharge: 2023-11-01 | Disposition: A | Discharge status: Home / Self Care | Payer: 59 | Source: Home / Self Care | Attending: Emergency Medicine | Admitting: Emergency Medicine

## 2023-11-01 ENCOUNTER — Telehealth: Payer: Self-pay

## 2023-11-01 ENCOUNTER — Emergency Department (HOSPITAL_COMMUNITY): Payer: 59

## 2023-11-01 VITALS — BP 162/84 | HR 74 | Ht 64.0 in | Wt 125.2 lb

## 2023-11-01 DIAGNOSIS — R197 Diarrhea, unspecified: Secondary | ICD-10-CM | POA: Insufficient documentation

## 2023-11-01 DIAGNOSIS — K580 Irritable bowel syndrome with diarrhea: Secondary | ICD-10-CM

## 2023-11-01 DIAGNOSIS — R112 Nausea with vomiting, unspecified: Secondary | ICD-10-CM

## 2023-11-01 DIAGNOSIS — R110 Nausea: Secondary | ICD-10-CM | POA: Insufficient documentation

## 2023-11-01 DIAGNOSIS — K6289 Other specified diseases of anus and rectum: Secondary | ICD-10-CM | POA: Diagnosis not present

## 2023-11-01 DIAGNOSIS — Z7982 Long term (current) use of aspirin: Secondary | ICD-10-CM | POA: Insufficient documentation

## 2023-11-01 DIAGNOSIS — R109 Unspecified abdominal pain: Secondary | ICD-10-CM | POA: Insufficient documentation

## 2023-11-01 DIAGNOSIS — R9389 Abnormal findings on diagnostic imaging of other specified body structures: Secondary | ICD-10-CM

## 2023-11-01 DIAGNOSIS — R1084 Generalized abdominal pain: Secondary | ICD-10-CM | POA: Insufficient documentation

## 2023-11-01 DIAGNOSIS — R634 Abnormal weight loss: Secondary | ICD-10-CM

## 2023-11-01 DIAGNOSIS — Z5321 Procedure and treatment not carried out due to patient leaving prior to being seen by health care provider: Secondary | ICD-10-CM | POA: Insufficient documentation

## 2023-11-01 DIAGNOSIS — K921 Melena: Secondary | ICD-10-CM

## 2023-11-01 LAB — URINALYSIS, ROUTINE W REFLEX MICROSCOPIC
Bilirubin Urine: NEGATIVE
Glucose, UA: NEGATIVE mg/dL
Hgb urine dipstick: NEGATIVE
Ketones, ur: NEGATIVE mg/dL
Leukocytes,Ua: NEGATIVE
Nitrite: NEGATIVE
Protein, ur: NEGATIVE mg/dL
Specific Gravity, Urine: 1.004 — ABNORMAL LOW (ref 1.005–1.030)
pH: 5 (ref 5.0–8.0)

## 2023-11-01 LAB — COMPREHENSIVE METABOLIC PANEL
ALT: 14 U/L (ref 0–44)
AST: 24 U/L (ref 15–41)
Albumin: 4.8 g/dL (ref 3.5–5.0)
Alkaline Phosphatase: 85 U/L (ref 38–126)
Anion gap: 13 (ref 5–15)
BUN: 14 mg/dL (ref 8–23)
CO2: 22 mmol/L (ref 22–32)
Calcium: 9.1 mg/dL (ref 8.9–10.3)
Chloride: 103 mmol/L (ref 98–111)
Creatinine, Ser: 0.75 mg/dL (ref 0.44–1.00)
GFR, Estimated: >60 mL/min (ref >60)
Glucose, Bld: 104 mg/dL — ABNORMAL HIGH (ref 70–99)
Potassium: 3.3 mmol/L — ABNORMAL LOW (ref 3.5–5.1)
Sodium: 138 mmol/L (ref 135–145)
Total Bilirubin: 0.6 mg/dL (ref 0.0–1.2)
Total Protein: 8.1 g/dL (ref 6.5–8.1)

## 2023-11-01 LAB — CBC
HCT: 39.4 % (ref 36.0–46.0)
Hemoglobin: 13.5 g/dL (ref 12.0–15.0)
MCH: 32.7 pg (ref 26.0–34.0)
MCHC: 34.3 g/dL (ref 30.0–36.0)
MCV: 95.4 fL (ref 80.0–100.0)
Platelets: 341 10*3/uL (ref 150–400)
RBC: 4.13 MIL/uL (ref 3.87–5.11)
RDW: 12.9 % (ref 11.5–15.5)
WBC: 8.4 10*3/uL (ref 4.0–10.5)
nRBC: 0 % (ref 0.0–0.2)

## 2023-11-01 LAB — LIPASE, BLOOD: Lipase: 41 U/L (ref 11–51)

## 2023-11-01 MED ORDER — ONDANSETRON HCL 4 MG/2ML IJ SOLN
4.0000 mg | Freq: Once | INTRAMUSCULAR | Status: AC
  Administered 2023-11-01: 4 mg via INTRAVENOUS
  Filled 2023-11-01: qty 2

## 2023-11-01 MED ORDER — IOHEXOL 300 MG/ML  SOLN
100.0000 mL | Freq: Once | INTRAMUSCULAR | Status: AC | PRN
  Administered 2023-11-01: 100 mL via INTRAVENOUS

## 2023-11-01 MED ORDER — TRAMADOL HCL 50 MG PO TABS: 50.0000 mg | ORAL_TABLET | Freq: Two times a day (BID) | ORAL | 0 refills | Status: DC | PRN

## 2023-11-01 MED ORDER — ONDANSETRON HCL 4 MG PO TABS: 4.0000 mg | ORAL_TABLET | Freq: Four times a day (QID) | ORAL | 0 refills | Status: DC

## 2023-11-01 MED ORDER — AMOXICILLIN-POT CLAVULANATE 875-125 MG PO TABS: 1.0000 | ORAL_TABLET | Freq: Two times a day (BID) | ORAL | 0 refills | Status: DC

## 2023-11-01 MED ORDER — MORPHINE SULFATE (PF) 4 MG/ML IV SOLN
4.0000 mg | Freq: Once | INTRAVENOUS | Status: AC
  Administered 2023-11-01: 4 mg via INTRAVENOUS
  Filled 2023-11-01: qty 1

## 2023-11-01 NOTE — ED Notes (Signed)
 NA x 3, assumed LWBS

## 2023-11-01 NOTE — Progress Notes (Signed)
Chief Complaint: Abdominal pain, weight loss, nausea and vomiting, diarrhea and blood in stools  HPI:    Amanda Bennett is a 68 year old female with a past medical history as listed below including previous anal fistula and anal abscess, COPD, chronic pain syndrome, osteoarthritis and multiple others, known to Dr. Leone Payor, who presented to the clinic today as an urgent work in for abdominal pain, weight loss, nausea and vomiting, diarrhea and blood in her stools.    Today, patient presents to clinic accompanied by her sister.  Apparently she has been having a terrible month.  Prior to this she has always had some GI issues, mainly diarrhea controlled on Dicyclomine and Cholestyramine as well as some reflux for which she uses Famotidine previously 40 mL grams daily.     Over the past month though she has diarrhea anytime she eats within 15 minutes.  Sometimes she can go 20-30 times a day.  Occasionally she is seeing bright red blood mixed in with what looks like a yellowy/ mucus that smells like "infection".  Along with this experiencing generalized abdominal pain all over her abdomen as well as occasional anal pain.  Rated at times as an 8-9/10. Tells me she was having a vomiting of acid and increased her Famotidine to 40 mg twice a day and even started using Omeprazole 40 mg once a day as well which did help with the vomiting of acid but has not helped with the nausea or occasional vomiting of food.  Associated symptoms include an increase in foul-smelling gas.  She also has uncontrollable generalized abdominal pain and cramping telling me that she increased her Dicyclomine to 20 mg every 4 hours and in fact even doubles up on this dosage at times.  She continues on her Cholestyramine just once daily.  Tells me she has had a 15 to 20 pound weight loss over the past month without trying.  Trying to stay hydrated but does not think that she is doing very well. Occasional subjective fever and chills.     In  fact she packed a bag and brought it with her thinking that she would likely need to go to the hospital. She can not "survive" at home any longer.     Denies nighttime awakening.  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   Anxiety    Bruises easily    Cervical spinal stenosis 2008   Chronic constipation    Chronic pain syndrome    neck and lower back   COPD (chronic obstructive pulmonary disease) (HCC)    Depression    Family history of adverse reaction to anesthesia    mother--  ponv   GERD (gastroesophageal reflux disease)    History of basal cell carcinoma excision    History of prolonged Q-T interval on ECG    per cardiologist, dr Kirke Corin, note in epic-- in the setting of treatment with medications that can cause prolonged QT, went back to normal after stopping the medications (was seen by dr Graciela Husbands no further workup recommended)   History of staph infection    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   Hypertension    Insomnia    Lesion of ulnar nerve    Mild carotid artery disease (HCC)    per duplex 08-23-2017  bilateral ICA 1-39%   OA (osteoarthritis)    neck and back   Occlusion of left vertebral  artery    noted 05-28-2017 MRI;  followed by cardiologist, dr Kirke Corin (per cardiologist has collaterals)   Osteoporosis    Perirectal abscess 07/08/2018   PONV (postoperative nausea and vomiting)    sometimes queezy   Primary localized osteoarthrosis, pelvic region and thigh    Wears dentures    upper   Wears glasses     Past Surgical History:  Procedure Laterality Date   ABDOMINAL HYSTERECTOMY  age 82s   CARPAL TUNNEL RELEASE Bilateral 2018   CHOLECYSTECTOMY     CHOLECYSTECTOMY  10/30/2011   Procedure: LAPAROSCOPIC CHOLECYSTECTOMY WITH INTRAOPERATIVE CHOLANGIOGRAM;  Surgeon: Wilmon Arms. Corliss Skains, MD;  Location: [facility];  Service: General;  Laterality: N/A;   COLONOSCOPY     DORSAL COMPARTMENT RELEASE  Left 07/20/2015   Procedure: RELEASE DORSAL COMPARTMENT (DEQUERVAIN);  Surgeon: Christena Flake, MD;  Location: [facility];  Service: Orthopedics;  Laterality: Left;   EVALUATION UNDER ANESTHESIA WITH FISTULECTOMY N/A 10/02/2018   Procedure: ANAL EXAM UNDER ANESTHESIA;  Surgeon: Romie Levee, MD;  Location: [facility];  Service: General;  Laterality: N/A;   HEMORRHOID SURGERY  11/25/2006   dr Lurene Shadow  @[facility]    I & D, ORIF RIGHT RING FINGER  Oct 9th, 15th ,2009   near amputation from dog bite   INCISION AND DRAINAGE PERIRECTAL ABSCESS N/A 07/08/2018   Procedure: IRRIGATION AND DEBRIDEMENT PERIRECTAL ABSCESS;  Surgeon: Sung Amabile, DO;  Location: [facility];  Service: General;  Laterality: N/A;   INGUINAL LYMPH NODE BIOPSY Right 02/03/2014   Procedure: INGUINAL LYMPH NODE BIOPSY;  Surgeon: Liz Malady, MD;  Location: [facility];  Service: General;  Laterality: Right;  right inguinal area   LAPAROSCOPIC SALPINGOOPHERECTOMY Bilateral age 71s   LIGATION OF INTERNAL FISTULA TRACT N/A 04/01/2019   Procedure: LIGATION OF INTERNAL FISTULA TRACT RESECTION OF ANAL LESION;  Surgeon: Romie Levee, MD;  Location: [facility];  Service: General;  Laterality: N/A;   ORIF CLAVICLE FRACTURE Right 2003   HARDWARE REMOVAL 08-22-2004 BY DR Ranell Patrick @[facility]    PLACEMENT OF SETON N/A 10/02/2018   Procedure: PLACEMENT OF SETON VS FISTULOTOMY, BIOPSY;  Surgeon: Romie Levee, MD;  Location: [facility] ;  Service: General;  Laterality: N/A;   SHOULDER ARTHROSCOPY Left 07/20/2015   Procedure: ARTHROSCOPY SHOULDER DEBRIDEMENT AND DECOMPRESSION;  Surgeon: Christena Flake, MD;  Location: [facility];  Service: Orthopedics;  Laterality: Left;   SHOULDER ARTHROSCOPY Left 01/12/2016   Procedure: ARTHROSCOPY SHOULDER WITH DEBRIDEMENT AND EXCISION OF THE DISTAL CLAVICLE;  Surgeon: Christena Flake, MD;  Location: [facility];  Service: Orthopedics;  Laterality:  Left;    Current Outpatient Medications  Medication Sig Dispense Refill   ASPIRIN 81 PO Take by mouth daily.     cholestyramine (QUESTRAN) 4 g packet Take 1 packet (4 g total) by mouth daily with supper. 90 each 0   dicyclomine (BENTYL) 20 MG tablet Take 20 mg by mouth every 4 (four) hours as needed for spasms (abd pain).     famotidine (PEPCID) 40 MG tablet Take 40 mg by mouth daily.     zolpidem (AMBIEN CR) 6.25 MG CR tablet Take 6.25 mg by mouth at bedtime as needed.     No current facility-administered medications for this visit.    Allergies as of 11/01/2023 - Review Complete 11/01/2023  Allergen Reaction Noted   Fluoxetine hcl Other (See Comments) 11/15/2015   Lisinopril Cough 01/06/2016   Alendronate-cholecalciferol Other (See Comments) 11/01/2023  Statins  01/08/2017   Hydrocodone Nausea Only and Rash 01/20/2015    Family History  Problem Relation Age of Onset   Anxiety disorder Mother    Hyperlipidemia Mother    Hypertension Mother    Valvular heart disease Mother    Coronary artery disease Mother    Dementia Mother    Stomach cancer Father    Prostate cancer Father    Colon cancer Father 70       deceased at age 106 from colon cancer   Pancreatic cancer Father    Esophageal cancer Father    Liver disease Father    Cancer Paternal Grandmother        ovarian   Stroke Neg Hx     Social History   Socioeconomic History   Marital status: Single    Spouse name: Not on file   Number of children: Not on file   Years of education: Not on file   Highest education level: Not on file  Occupational History   Occupation: Horse Trainer  Tobacco Use   Smoking status: Former    Types: Cigarettes   Smokeless tobacco: Never  Vaping Use   Vaping status: Former  Substance and Sexual Activity   Alcohol use: Not Currently   Drug use: No   Sexual activity: Not on file  Other Topics Concern   Not on file  Social History Narrative   ** Merged History Encounter **        Disabled 2 daughters Reports single marital status Smoker, 2 caffeinated beverages a day no other tobacco no drug use no alcohol   Social Drivers of Corporate investment banker Strain: Not on file  Food Insecurity: Not on file  Transportation Needs: Not on file  Physical Activity: Not on file  Stress: Not on file  Social Connections: Not on file  Intimate Partner Violence: Not on file    Review of Systems:    Constitutional:See HPI Skin: No rash  Cardiovascular: No chest pain Respiratory: No SOB Gastrointestinal: See HPI and otherwise negative Genitourinary: No dysuria or change in urinary frequency Neurological: +dizziness and occasional syncope Musculoskeletal: No new muscle or joint pain Hematologic: No  bruising Psychiatric: No history of depression or anxiety   Physical Exam:  Vital signs: BP (!) 162/84   Pulse 74   Ht 5\' 4"  (1.626 m)   Wt 125 lb 4 oz (56.8 kg)   LMP 10/23/2002   SpO2 97%   BMI 21.50 kg/m   Constitutional:   Pleasant thin appearing, Caucasian female appears to be in NAD, Well developed, alert and cooperative Head:  Normocephalic and atraumatic. Eyes:   PEERL, EOMI. No icterus. Conjunctiva pink. Ears:  Normal auditory acuity. Neck:  Supple Throat: Oral cavity and pharynx without inflammation, swelling or lesion.  Respiratory: Respirations even and unlabored. Lungs clear to auscultation bilaterally.   No wheezes, crackles, or rhonchi.  Cardiovascular: Normal S1, S2. No MRG. Regular rate and rhythm. No peripheral edema, cyanosis or pallor.  Gastrointestinal:  Soft, nondistended, Moderate generalized ttp with some involuntary guarding, Normal bowel sounds. No appreciable masses or hepatomegaly. Rectal:  Not performed.  Msk:  Symmetrical without gross deformities. Without edema, no deformity or joint abnormality.  Neurologic:  Alert and  oriented x4;  grossly normal neurologically.  Skin:   Dry and intact without significant lesions or  rashes. Psychiatric: Demonstrates good judgement and reason without abnormal affect or behaviors.  RELEVANT LABS AND IMAGING: CBC    Component Value  Date/Time   WBC 8.9 10/31/2023 1514   RBC 4.31 10/31/2023 1514   HGB 13.9 10/31/2023 1514   HGB 14.6 04/11/2023 1412   HCT 41.2 10/31/2023 1514   HCT 42.6 04/11/2023 1412   PLT 336 10/31/2023 1514   PLT 270 04/11/2023 1412   MCV 95.6 10/31/2023 1514   MCV 93 04/11/2023 1412   MCH 32.3 10/31/2023 1514   MCHC 33.7 10/31/2023 1514   RDW 12.8 10/31/2023 1514   RDW 13.1 04/11/2023 1412   LYMPHSABS 1.9 04/11/2023 1412   MONOABS 0.7 07/06/2022 1352   EOSABS 0.1 04/11/2023 1412   BASOSABS 0.1 04/11/2023 1412    CMP     Component Value Date/Time   NA 136 10/31/2023 1514   NA 142 04/11/2023 1412   K 4.3 10/31/2023 1514   CL 107 10/31/2023 1514   CO2 21 (L) 10/31/2023 1514   GLUCOSE 119 (H) 10/31/2023 1514   BUN 12 10/31/2023 1514   BUN 12 04/11/2023 1412   CREATININE 0.62 10/31/2023 1514   CREATININE 0.64 11/20/2016 1148   CALCIUM 9.2 10/31/2023 1514   PROT 8.1 10/31/2023 1514   PROT 7.6 04/11/2023 1412   ALBUMIN 4.4 10/31/2023 1514   ALBUMIN 4.8 04/11/2023 1412   AST 22 10/31/2023 1514   ALT 14 10/31/2023 1514   ALKPHOS 84 10/31/2023 1514   BILITOT 0.6 10/31/2023 1514   BILITOT 0.3 04/11/2023 1412   GFRNONAA >60 10/31/2023 1514   GFRNONAA >89 11/17/2015 0742   GFRAA >60 03/23/2020 0144   GFRAA >89 11/17/2015 0742    Assessment: 1.  Generalized abdominal pain: Worse over the past 6 months, has increased Dicyclomine to 20-40 mg every 4 hours, which is still not helping completely, remains with diarrhea sometimes 20-30 times a day and nausea with occasional vomiting as well as a weight loss of 30 pounds, prior history of anal abscess and does describe some rectal pain, known IBS as well as bile salt diarrhea; consider infectious cause versus inflammatory versus exacerbation of chronic issues 2.  Diarrhea 3.  Nausea and  vomiting 4.  Hematochezia: Consider relation to hemorrhoids +/- colitis versus other 5.  Weight loss: 20 to 30 pounds in 6 months without trying does admit to a decreased appetite 6.  Gas  Plan: 1.  Had a long discussion with the patient.  Discussed what we would do outpatient in order to work this up.  Unfortunately it is a Friday and I would not get a lot of these results back until next week.  She does not feel like she can survive at home any longer.  Tells me that she is dwindling.  Told her to go to the [facility], ER.  Would recommend that she be admitted for further workup including a CT of the abdomen and pelvis and even possibly an EGD/ colonoscopy pending stool study results. 2.  Would recommend a twice daily PPI such as Pantoprazole 40 mg twice a day 3.  Would also increase her Cholestyramine to twice daily. 4.  Please call our team if she is admitted so that we can consult on her in the hospital.  I have alerted our team Doug Sou and Dr. Chales Abrahams. 5.  Patient to follow in clinic per recommendations after hospitalization.  Hyacinth Meeker, PA-C [facility] 11/01/2023, 10:59 AM  Cc: Merri Brunette, MD

## 2023-11-01 NOTE — ED Provider Notes (Signed)
[place] EMERGENCY DEPARTMENT AT [facility] Provider Note   CSN: [id number] Arrival date & time: 11/01/23  1132     History  Chief Complaint  Patient presents with   Abdominal Pain   Diarrhea   Nausea    KEENYA MATERA is a 68 y.o. female.  HPI   68 year old female past medical history of IBS, previous rectal fistula and abscess presents emergency department with worsening diarrhea.  Patient states that she is having 10-20 episodes of diarrhea today.  She describes it as mucousy, bloody.  She has urgency and generalized abdominal cramping.  At times she has rectal fullness but this is currently resolved.  Denies any fever or systemic symptoms.  Was seen in the GI office today by PA and referred here for further evaluation, CT scanning.  States that she had a decreased p.o. intake, weight loss.  Home Medications Prior to Admission medications   Medication Sig Start Date End Date Taking? Authorizing Provider  ASPIRIN 81 PO Take by mouth daily.    [provider]  cholestyramine (QUESTRAN) 4 g packet Take 1 packet (4 g total) by mouth daily with supper. 11/20/21   Iva Boop, MD  dicyclomine (BENTYL) 20 MG tablet Take 20 mg by mouth every 4 (four) hours as needed for spasms (abd pain).    [provider]  famotidine (PEPCID) 40 MG tablet Take 40 mg by mouth daily. 06/14/23   [provider]  zolpidem (AMBIEN CR) 6.25 MG CR tablet Take 6.25 mg by mouth at bedtime as needed. 06/13/23   [provider]      Allergies    Fluoxetine hcl, Lisinopril, Alendronate-cholecalciferol, Statins, and Hydrocodone    Review of Systems   Review of Systems  Constitutional:  Positive for appetite change, chills, fatigue and unexpected weight change. Negative for fever.  Respiratory:  Negative for shortness of breath.   Cardiovascular:  Negative for chest pain.  Gastrointestinal:  Positive for abdominal pain, blood in stool, diarrhea, nausea and  rectal pain. Negative for abdominal distention and vomiting.  Skin:  Negative for rash.  Neurological:  Negative for headaches.    Physical Exam Updated Vital Signs BP (!) 154/84   Pulse 68   Temp 98.2 F (36.8 C) (Oral)   Resp 16   LMP 10/23/2002   SpO2 100%  Physical Exam Vitals and nursing note reviewed.  Constitutional:      General: She is not in acute distress.    Appearance: Normal appearance.  HENT:     Head: Normocephalic.     Mouth/Throat:     Mouth: Mucous membranes are moist.  Cardiovascular:     Rate and Rhythm: Normal rate.  Pulmonary:     Effort: Pulmonary effort is normal. No respiratory distress.  Abdominal:     General: Bowel sounds are normal. There is no distension.     Palpations: Abdomen is soft.     Tenderness: There is generalized abdominal tenderness. There is no rebound.     Hernia: No hernia is present.  Skin:    General: Skin is warm.  Neurological:     Mental Status: She is alert and oriented to person, place, and time. Mental status is at baseline.  Psychiatric:        Mood and Affect: Mood normal.     ED Results / Procedures / Treatments   Labs (all labs ordered are listed, but only abnormal results are displayed) Labs Reviewed  COMPREHENSIVE METABOLIC PANEL -  Abnormal; Notable for the following components:      Result Value   Potassium 3.3 (*)    Glucose, Bld 104 (*)    All other components within normal limits  URINALYSIS, ROUTINE W REFLEX MICROSCOPIC - Abnormal; Notable for the following components:   Color, Urine STRAW (*)    Specific Gravity, Urine 1.004 (*)    All other components within normal limits  C DIFFICILE QUICK SCREEN W PCR REFLEX    OVA + PARASITE EXAM  STOOL CULTURE  LIPASE, BLOOD  CBC    EKG None  Radiology No results found.  Procedures Procedures    Medications Ordered in ED Medications  morphine (PF) 4 MG/ML injection 4 mg (has no administration in time range)  ondansetron (ZOFRAN) injection 4  mg (has no administration in time range)  iohexol (OMNIPAQUE) 300 MG/ML solution 100 mL (100 mLs Intravenous Contrast Given 11/01/23 1323)    ED Course/ Medical Decision Making/ A&P                                 Medical Decision Making Amount and/or Complexity of Data Reviewed Labs: ordered. Radiology: ordered.  Risk Prescription drug management.   68 year old female presents emergency department ongoing abdominal pain, mucousy/bloody diarrhea.  History of IBS, follows with [facility].  Was seen in the office today and sent in for blood work and CT evaluation.  Vital signs are normal and stable.  Abdomen is tender but benign.  Blood work is reassuring without any acute abnormality.  CT scan shows diffuse colon thickening, inflammatory versus possibly infectious.  Given the degree of her symptoms we will plan to treat for infectious etiology.  Consulted with the GI team who are in agreement, they have her scheduled for outpatient follow-up next week and a colonoscopy the first week of March.  Stool studies have been ordered, will attempt to get a C. difficile sample before she leaves.  Patient at this time appears safe and stable for discharge and close outpatient follow up. Discharge plan and strict return to ED precautions discussed, patient verbalizes understanding and agreement.        Final Clinical Impression(s) / ED Diagnoses Final diagnoses:  None    Rx / DC Orders ED Discharge Orders     None         Rozelle Logan, DO 11/01/23 1527

## 2023-11-01 NOTE — Discharge Instructions (Addendum)
You have been seen and discharged from the emergency department.  Your blood work was normal.  Your CT scan showed diffuse inflammation, either secondary to IBS or most likely an infectious source.  I consulted with the GI team.  They recommend placing you on antibiotics and will plan for a colonoscopy in the first week of March once all the inflammation goes down.  I have also sent you with nausea medicine and pain medicine.  Do not mix this medication with alcohol or other sedating medications. Do not drive or do heavy physical activity until you know how this medication affects you.  It may cause drowsiness.  Follow-up with your primary provider for further evaluation and further care. Take home medications as prescribed. If you have any worsening symptoms or further concerns for your health please return to an emergency department for further evaluation.

## 2023-11-01 NOTE — Telephone Encounter (Signed)
Tentatively scheduled patient for colon on 11/15/23 at 9:00 am in the [facility] with Dr. Leone Payor. Amb ref placed. Will hold off on instructions & sending prep until we confirm procedure. Will have Viviann Spare, RN reach out early next week while I am out of office to follow up with patient.

## 2023-11-01 NOTE — ED Triage Notes (Signed)
Patient presents with abdominal pain, nausea and diarrhea. She left her GI today and came here for a CT.

## 2023-11-01 NOTE — Telephone Encounter (Signed)
-----   Message from [place] D. Zehr sent at 11/01/2023  3:18 PM EST ----- Hello!  Patient came to the ED where vitals and labs were unremarkable.  CT scan showed some diffuse colitis.  ED physician said patient otherwise looks fine.  Not able to produce a stool in the ED, but she is going to have her try before she leaves.  She is going to put her on some antibiotics and send her home.  Can we please put her on tentatively for colonoscopy spot with Dr. Leone Payor on 3/7.  Then Glendora Score if you could reach out to her next week and see how she is doing on the antibiotics.  This will either be an infectious colitis that will resolve with antibiotics or if it is a new onset IBD then we will see that on the colonoscopy.  Baldo Ash, I hope that sounds okay.  I had run it by Elby Showers as well and he agreed with the plan.  Thank you,  Jess

## 2023-11-04 ENCOUNTER — Other Ambulatory Visit: Payer: Self-pay

## 2023-11-04 ENCOUNTER — Ambulatory Visit (INDEPENDENT_AMBULATORY_CARE_PROVIDER_SITE_OTHER): Payer: Medicare HMO

## 2023-11-04 DIAGNOSIS — R197 Diarrhea, unspecified: Secondary | ICD-10-CM

## 2023-11-04 DIAGNOSIS — R112 Nausea with vomiting, unspecified: Secondary | ICD-10-CM

## 2023-11-04 DIAGNOSIS — R634 Abnormal weight loss: Secondary | ICD-10-CM

## 2023-11-04 DIAGNOSIS — R55 Syncope and collapse: Secondary | ICD-10-CM

## 2023-11-04 DIAGNOSIS — R1084 Generalized abdominal pain: Secondary | ICD-10-CM

## 2023-11-04 DIAGNOSIS — K921 Melena: Secondary | ICD-10-CM

## 2023-11-04 DIAGNOSIS — I441 Atrioventricular block, second degree: Secondary | ICD-10-CM

## 2023-11-04 DIAGNOSIS — R9389 Abnormal findings on diagnostic imaging of other specified body structures: Secondary | ICD-10-CM

## 2023-11-04 LAB — CUP PACEART REMOTE DEVICE CHECK
Date Time Interrogation Session: 20250224080210
Implantable Pulse Generator Implant Date: 20241018
Pulse Gen Serial Number: 511035142

## 2023-11-04 MED ORDER — SUFLAVE 178.7 G PO SOLR: 1.0000 | Freq: Once | ORAL | 0 refills | Status: AC

## 2023-11-04 NOTE — Telephone Encounter (Signed)
 Pt made aware of providers recommendations: Pt made aware that she has been scheduled for the colonoscopy on 11/15/2023 at 9:00 AM with Dr. Leone Payor in the [facility]. Pt agreed. Prep sent to pharmacy. Pt made aware that they will send prep to her in Mail. Prep instructions created and sent to pt via my chart and mailed to pt. Address confirmed.  Pt verbalized understanding with all questions answered.

## 2023-11-06 ENCOUNTER — Encounter: Payer: Self-pay | Admitting: Internal Medicine

## 2023-11-08 ENCOUNTER — Ambulatory Visit: Payer: 59 | Attending: Cardiovascular Disease | Admitting: Cardiovascular Disease

## 2023-11-08 ENCOUNTER — Encounter: Payer: Self-pay | Admitting: Cardiovascular Disease

## 2023-11-08 VITALS — BP 138/92 | HR 82 | Ht 64.0 in | Wt 125.2 lb

## 2023-11-08 DIAGNOSIS — R55 Syncope and collapse: Secondary | ICD-10-CM

## 2023-11-08 DIAGNOSIS — I471 Supraventricular tachycardia, unspecified: Secondary | ICD-10-CM | POA: Diagnosis not present

## 2023-11-08 DIAGNOSIS — I442 Atrioventricular block, complete: Secondary | ICD-10-CM | POA: Diagnosis not present

## 2023-11-08 DIAGNOSIS — R002 Palpitations: Secondary | ICD-10-CM

## 2023-11-08 DIAGNOSIS — I441 Atrioventricular block, second degree: Secondary | ICD-10-CM | POA: Diagnosis not present

## 2023-11-08 NOTE — Progress Notes (Signed)
 Electrophysiology Office Note:    Date:  11/08/2023   ID:  Amanda Bennett, DOB 02-06-1956, MRN [id number]  PCP:  Merri Brunette, MD   [facility] Providers Cardiologist:  Lorine Bears, MD Electrophysiologist:  Maurice Small, MD     Referring MD: Merri Brunette, MD   History of Present Illness:    Amanda Bennett is a 68 y.o. female with a medical history significant for COPD, referred for management of arrhythmia.     A monitor was placed after an episode of syncope. 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 Additionally, there were brief episodes of SVT lasting up to 12.5 seconds.     She has been having frequent episodes of dizziness and lightheadedness.  These are oftentimes associated with visions of flashing lights.  She has had multiple episodes syncope over the years.  She did not have any syncope and does not recall having lightheadedness while wearing her monitor.  A loop recorder was placed June 28, 2023.  Since, she has had 2 symptomatic episodes detected on her loop recorder.  These correlated with patient triggered events for near syncope.  1 event correlated with normal sinus rhythm and normal AV conduction.  With the second episode, there was a 3-second episode of AV block.  The symptoms were associated with nausea and flushing.   She is continued to have syncopal and presyncopal episodes.  Recently, she has had a symptom episodes associated with pauses up to 6 seconds.  EKGs/Labs/Other Studies Reviewed Today:    Echocardiogram:  TTE 04/10/2023 EF 60-65%, Gr I diastolic dysfunction. No significant valvular disease   Monitors:  Zio monitor - 13 days, July 2024 - my interpretation Sinus rhythm HR 22-139 bpm, avg 84 Multiple nocturnal pauses noted with high grade AV block and sinus node dysfunction  Stress testing:   Advanced imaging:   Cardiac catherization    EKG:          Physical Exam:    VS:  BP (!) 138/92 (BP Location: Left Arm, Patient Position: Sitting, Cuff Size: Normal)   Pulse 82   Ht 5\' 4"  (1.626 m)   Wt 125 lb 3.2 oz (56.8 kg)   LMP 10/23/2002   SpO2 98%   BMI 21.49 kg/m     Wt Readings from Last 3 Encounters:  11/08/23 125 lb 3.2 oz (56.8 kg)  11/01/23 125 lb 4 oz (56.8 kg)  08/01/23 136 lb 12.8 oz (62.1 kg)     GEN:  Well nourished, well developed in no acute distress CARDIAC: RRR, no murmurs, rubs, gallops RESPIRATORY:  Normal work of breathing MUSCULOSKELETAL: no edema    ASSESSMENT & PLAN:    Nocturnal pauses Not a good indication for pacemaker Pt has completed a sleep study and is awaiting results  Syncope Associated symptoms of flushing, nausea 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 Plan to explant loop recorder at time of pacemaker  I discussed the indication for the procedure and the logistics, risks, potential benefit, and after care. I specifically explained that risks include but are not limited to infection, bleeding,damage to blood vessels, lung, and the heart -- but risk of prolonged hospitalization, need for surgery, or the event of stroke, heart attack, or death are low  but not zero.    SVT Episodes detected on her monitor were brief We will monitor with loop recorder      Signed, Maurice Small, MD  11/08/2023 3:16 PM    [facility]

## 2023-11-08 NOTE — Patient Instructions (Signed)
 Medication Instructions:  Your physician recommends that you continue on your current medications as directed. Please refer to the Current Medication list given to you today. *If you need a refill on your cardiac medications before your next appointment, please call your pharmacy*   Lab Work: CBC and BMET today - please have lab work completed at [facility] on first floor  If you have labs (blood work) drawn today and your tests are completely normal, you will receive your results only by: MyChart Message (if you have MyChart) OR A paper copy in the mail If you have any lab test that is abnormal or we need to change your treatment, we will call you to review the results.   Testing/Procedures: Dual Chamber Pacemaker - see instruction letter Your physician has recommended that you have a pacemaker inserted. A pacemaker is a small device that is placed under the skin of your chest or abdomen to help control abnormal heart rhythms. This device uses electrical pulses to prompt the heart to beat at a normal rate. Pacemakers are used to treat heart rhythms that are too slow. Wire (leads) are attached to the pacemaker that goes into the chambers of you heart. This is done in the hospital and usually requires and overnight stay. Please see the instruction sheet given to you today for more information.   Follow-Up: At [facility], you and your health needs are our priority.  As part of our continuing mission to provide you with exceptional heart care, we have created designated Provider Care Teams.  These Care Teams include your primary Cardiologist (physician) and Advanced Practice Providers (APPs -  Physician Assistants and Nurse Practitioners) who all work together to provide you with the care you need, when you need it.  We recommend signing up for the patient portal called "MyChart".  Sign up information is provided on this After Visit Summary.  MyChart is used to connect with patients for  Virtual Visits (Telemedicine).  Patients are able to view lab/test results, encounter notes, upcoming appointments, etc.  Non-urgent messages can be sent to your provider as well.   To learn more about what you can do with MyChart, go to [email or web address].    Your next appointment:   We will schedule follow up after pacemaker implant  Provider:   York Pellant, MD

## 2023-11-08 NOTE — H&P (View-Only) (Signed)
 Electrophysiology Office Note:    Date:  11/08/2023   ID:  Amanda Bennett, DOB 02-06-1956, MRN [id number]  PCP:  Merri Brunette, MD   [facility] Providers Cardiologist:  Lorine Bears, MD Electrophysiologist:  Maurice Small, MD     Referring MD: Merri Brunette, MD   History of Present Illness:    Amanda Bennett is a 68 y.o. female with a medical history significant for COPD, referred for management of arrhythmia.     A monitor was placed after an episode of syncope. 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 Additionally, there were brief episodes of SVT lasting up to 12.5 seconds.     She has been having frequent episodes of dizziness and lightheadedness.  These are oftentimes associated with visions of flashing lights.  She has had multiple episodes syncope over the years.  She did not have any syncope and does not recall having lightheadedness while wearing her monitor.  A loop recorder was placed June 28, 2023.  Since, she has had 2 symptomatic episodes detected on her loop recorder.  These correlated with patient triggered events for near syncope.  1 event correlated with normal sinus rhythm and normal AV conduction.  With the second episode, there was a 3-second episode of AV block.  The symptoms were associated with nausea and flushing.   She is continued to have syncopal and presyncopal episodes.  Recently, she has had a symptom episodes associated with pauses up to 6 seconds.  EKGs/Labs/Other Studies Reviewed Today:    Echocardiogram:  TTE 04/10/2023 EF 60-65%, Gr I diastolic dysfunction. No significant valvular disease   Monitors:  Zio monitor - 13 days, July 2024 - my interpretation Sinus rhythm HR 22-139 bpm, avg 84 Multiple nocturnal pauses noted with high grade AV block and sinus node dysfunction  Stress testing:   Advanced imaging:   Cardiac catherization    EKG:          Physical Exam:    VS:  BP (!) 138/92 (BP Location: Left Arm, Patient Position: Sitting, Cuff Size: Normal)   Pulse 82   Ht 5\' 4"  (1.626 m)   Wt 125 lb 3.2 oz (56.8 kg)   LMP 10/23/2002   SpO2 98%   BMI 21.49 kg/m     Wt Readings from Last 3 Encounters:  11/08/23 125 lb 3.2 oz (56.8 kg)  11/01/23 125 lb 4 oz (56.8 kg)  08/01/23 136 lb 12.8 oz (62.1 kg)     GEN:  Well nourished, well developed in no acute distress CARDIAC: RRR, no murmurs, rubs, gallops RESPIRATORY:  Normal work of breathing MUSCULOSKELETAL: no edema    ASSESSMENT & PLAN:    Nocturnal pauses Not a good indication for pacemaker Pt has completed a sleep study and is awaiting results  Syncope Associated symptoms of flushing, nausea 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 Plan to explant loop recorder at time of pacemaker  I discussed the indication for the procedure and the logistics, risks, potential benefit, and after care. I specifically explained that risks include but are not limited to infection, bleeding,damage to blood vessels, lung, and the heart -- but risk of prolonged hospitalization, need for surgery, or the event of stroke, heart attack, or death are low  but not zero.    SVT Episodes detected on her monitor were brief We will monitor with loop recorder      Signed, Maurice Small, MD  11/08/2023 3:16 PM    [facility]

## 2023-11-08 NOTE — Addendum Note (Signed)
 Addended by: Sherle Poe R on: 11/08/2023 05:05 PM   Modules accepted: Orders

## 2023-11-09 LAB — CBC
Hematocrit: 43 % (ref 34.0–46.6)
Hemoglobin: 14.5 g/dL (ref 11.1–15.9)
MCH: 32 pg (ref 26.6–33.0)
MCHC: 33.7 g/dL (ref 31.5–35.7)
MCV: 95 fL (ref 79–97)
Platelets: 307 10*3/uL (ref 150–450)
RBC: 4.53 x10E6/uL (ref 3.77–5.28)
RDW: 12.3 % (ref 11.7–15.4)
WBC: 7.7 10*3/uL (ref 3.4–10.8)

## 2023-11-09 LAB — BASIC METABOLIC PANEL
BUN/Creatinine Ratio: 13 (ref 12–28)
BUN: 10 mg/dL (ref 8–27)
CO2: 25 mmol/L (ref 20–29)
Calcium: 10.1 mg/dL (ref 8.7–10.3)
Chloride: 101 mmol/L (ref 96–106)
Creatinine, Ser: 0.8 mg/dL (ref 0.57–1.00)
Glucose: 90 mg/dL (ref 70–99)
Potassium: 4.7 mmol/L (ref 3.5–5.2)
Sodium: 139 mmol/L (ref 134–144)
eGFR: 82 mL/min/{1.73_m2} (ref >59)

## 2023-11-12 NOTE — Pre-Procedure Instructions (Signed)
 Instructed patient on the following items: Arrival time 1330 Nothing to eat or drink after midnight No meds AM of procedure Responsible person to drive you home and stay with you for 24 hrs Wash with special soap night before and morning of procedure If on anti-coagulant drug instructions ASA- last dose 3/2.

## 2023-11-13 NOTE — Addendum Note (Signed)
 Addended by: Elease Etienne A on: 11/13/2023 04:14 PM   Modules accepted: Orders

## 2023-11-13 NOTE — Progress Notes (Signed)
 Carelink Summary Report / Loop Recorder

## 2023-11-15 ENCOUNTER — Telehealth (HOSPITAL_COMMUNITY): Payer: Self-pay

## 2023-11-15 ENCOUNTER — Telehealth: Payer: Self-pay | Admitting: *Deleted

## 2023-11-15 ENCOUNTER — Encounter: Payer: 59 | Admitting: Internal Medicine

## 2023-11-15 NOTE — Telephone Encounter (Signed)
 Call placed to patient to discuss upcoming procedure.   Confirmed patient is scheduled for a Permanent transvenous pacemaker (PPM) and Removal of Loop Recorder on Friday, March 14 with Dr. York Pellant. Instructed patient to arrive at the Main Entrance A at [facility]: [address] [place], [place] [address] and check in at Admitting at 11:30 AM.   Any recent signs of acute illness or been started on antibiotics? Patient reports she completed antibiotics on 11/08/23 for diverticulitis and symptoms have completely resolved.  Labs completed on 11/08/23 and acceptable.  Any new medications started? No Diabetic medications to hold? No Medication instructions:  On the morning of your procedure hold your Aspirin for 2 day(s) prior to your procedure. Your last dose will be Tuesday, March 11, AM dose. No eating or drinking after midnight prior to procedure.   The night before your procedure and the morning of your procedure scrub your neck/chest with the CHG surgical soap.   Advised of plan to go home the same day and will only stay overnight if medically necessary. You MUST have a responsible adult to drive you home and MUST be with you the first 24 hours after you arrive home.  Patient verbalized understanding to all instructions provided and agreed to proceed with procedure.

## 2023-11-15 NOTE — Telephone Encounter (Addendum)
 Alert received from [facility] regarding pause episode on ILR:  Alert remote transmission:  Pause Event occurred 3/5 @ 19:20, duration 6sec per device, non-conducted P waves.  This has been seen in the past.   Route to triage high alert per protocol PPM implant scheduled 11/22/23 per EPIC  Presenting rhythm as of 11/14/23 at 02:04: SR ~70bpm.

## 2023-11-15 NOTE — Telephone Encounter (Signed)
 Spoke with patient who states she did have dizziness associated with time of pause.  Lasted for 2-3 mins and has has not had any symptoms since.  Gave ER precautions and she will go if she has symptoms that do not subside or she passes out.  She was appreciative of my call.

## 2023-11-18 ENCOUNTER — Telehealth: Payer: Self-pay

## 2023-11-18 NOTE — Telephone Encounter (Signed)
 Spoke with patient, had to cancel pacemaker implant on 3/5 due to lab being down. Rescheduled implant for this Wednesday 11/20/23. Patient instructed to hold aspirin until after implant. No questions at this time

## 2023-11-18 NOTE — Telephone Encounter (Signed)
 Spoke w/ pt. No syncope. Dizziness and lightheadedness @ time of event. Scheduled for Wednesday for PPM. Reviewed w/ Mealor MD. No changes @ this time.

## 2023-11-18 NOTE — Telephone Encounter (Signed)
 Alert remote transmission:  Pause Event occurred 3/9 @ 16:43, duration 7sec per device, non-conducted P waves.  This has been seen in the past.   Route to triage high alert per protocol PPM implant scheduled 11/22/23 per PA report Follow up as scheduled. LA, CV[facility]   Called and LMTCB to assess for symptoms.

## 2023-11-19 NOTE — Pre-Procedure Instructions (Signed)
 Attempted to call patient regarding procedure instructions.  Left voicemail on the following items: Arrival time 1:30 Nothing to eat or drink after midnight No meds AM of procedure Responsible person to drive you home and stay with you for 24 hrs Wash with special soap night before and morning of procedure If on anti-coagulant drug instructions ASA- holding for 2 days.

## 2023-11-20 ENCOUNTER — Ambulatory Visit (HOSPITAL_COMMUNITY)
Admission: RE | Admit: 2023-11-20 | Discharge: 2023-11-20 | Disposition: A | Discharge status: Home / Self Care | Payer: 59 | Source: Ambulatory Visit | Attending: Cardiovascular Disease | Admitting: Cardiovascular Disease

## 2023-11-20 ENCOUNTER — Ambulatory Visit (HOSPITAL_COMMUNITY)

## 2023-11-20 ENCOUNTER — Other Ambulatory Visit: Payer: Self-pay

## 2023-11-20 ENCOUNTER — Encounter (HOSPITAL_COMMUNITY)
Admission: RE | Disposition: A | Discharge status: Home / Self Care | Payer: Self-pay | Source: Ambulatory Visit | Attending: Cardiovascular Disease

## 2023-11-20 DIAGNOSIS — I471 Supraventricular tachycardia, unspecified: Secondary | ICD-10-CM | POA: Insufficient documentation

## 2023-11-20 DIAGNOSIS — R55 Syncope and collapse: Secondary | ICD-10-CM | POA: Diagnosis not present

## 2023-11-20 DIAGNOSIS — R001 Bradycardia, unspecified: Secondary | ICD-10-CM | POA: Diagnosis not present

## 2023-11-20 DIAGNOSIS — I4430 Unspecified atrioventricular block: Secondary | ICD-10-CM

## 2023-11-20 DIAGNOSIS — J449 Chronic obstructive pulmonary disease, unspecified: Secondary | ICD-10-CM | POA: Insufficient documentation

## 2023-11-20 DIAGNOSIS — J9811 Atelectasis: Secondary | ICD-10-CM | POA: Diagnosis not present

## 2023-11-20 DIAGNOSIS — Z950 Presence of cardiac pacemaker: Secondary | ICD-10-CM | POA: Diagnosis not present

## 2023-11-20 HISTORY — PX: PACEMAKER IMPLANT: EP1218

## 2023-11-20 HISTORY — PX: LOOP RECORDER REMOVAL: EP1215

## 2023-11-20 SURGERY — PACEMAKER IMPLANT

## 2023-11-20 MED ORDER — VALSARTAN 40 MG PO TABS: 40.0000 mg | ORAL_TABLET | Freq: Every day | ORAL | 2 refills | Status: DC

## 2023-11-20 MED ORDER — LIDOCAINE HCL (PF) 1 % IJ SOLN
INTRAMUSCULAR | Status: DC | PRN
  Administered 2023-11-20: 50 mL
  Administered 2023-11-20: 5 mL

## 2023-11-20 MED ORDER — ONDANSETRON HCL 4 MG/2ML IJ SOLN: 4.0000 mg | Freq: Four times a day (QID) | INTRAMUSCULAR | Status: DC | PRN

## 2023-11-20 MED ORDER — MIDAZOLAM HCL 5 MG/5ML IJ SOLN
INTRAMUSCULAR | Status: AC
  Filled 2023-11-20: qty 5

## 2023-11-20 MED ORDER — CEFAZOLIN SODIUM-DEXTROSE 2-4 GM/100ML-% IV SOLN
INTRAVENOUS | Status: DC
Start: 2023-11-20 — End: 2023-11-21
  Filled 2023-11-20: qty 100

## 2023-11-20 MED ORDER — POVIDONE-IODINE 10 % EX SWAB
2.0000 | Freq: Once | CUTANEOUS | Status: AC
  Administered 2023-11-20: 2 via TOPICAL

## 2023-11-20 MED ORDER — HEPARIN (PORCINE) IN NACL 1000-0.9 UT/500ML-% IV SOLN
INTRAVENOUS | Status: DC | PRN
Start: 2023-11-20 — End: 2023-11-20
  Administered 2023-11-20: 500 mL

## 2023-11-20 MED ORDER — SODIUM CHLORIDE 0.9 % IV SOLN
INTRAVENOUS | Status: AC
  Filled 2023-11-20: qty 2

## 2023-11-20 MED ORDER — FENTANYL CITRATE (PF) 100 MCG/2ML IJ SOLN
INTRAMUSCULAR | Status: AC
  Filled 2023-11-20: qty 2

## 2023-11-20 MED ORDER — SODIUM CHLORIDE 0.9 % IV SOLN: INTRAVENOUS | Status: DC

## 2023-11-20 MED ORDER — CEFAZOLIN SODIUM-DEXTROSE 2-4 GM/100ML-% IV SOLN
2.0000 g | INTRAVENOUS | Status: AC
  Administered 2023-11-20: 2 g via INTRAVENOUS

## 2023-11-20 MED ORDER — IRBESARTAN 75 MG PO TABS
37.5000 mg | ORAL_TABLET | Freq: Every day | ORAL | Status: DC
  Administered 2023-11-20: 37.5 mg via ORAL
  Filled 2023-11-20: qty 0.5

## 2023-11-20 MED ORDER — FENTANYL CITRATE (PF) 100 MCG/2ML IJ SOLN
INTRAMUSCULAR | Status: DC | PRN
  Administered 2023-11-20 (×4): 25 ug via INTRAVENOUS

## 2023-11-20 MED ORDER — LIDOCAINE HCL 1 % IJ SOLN
INTRAMUSCULAR | Status: AC
  Filled 2023-11-20: qty 60

## 2023-11-20 MED ORDER — MIDAZOLAM HCL 5 MG/5ML IJ SOLN
INTRAMUSCULAR | Status: DC | PRN
  Administered 2023-11-20 (×4): 1 mg via INTRAVENOUS

## 2023-11-20 MED ORDER — SODIUM CHLORIDE 0.9 % IV SOLN
80.0000 mg | INTRAVENOUS | Status: AC
  Administered 2023-11-20: 80 mg

## 2023-11-20 MED ORDER — ACETAMINOPHEN 325 MG PO TABS
325.0000 mg | ORAL_TABLET | ORAL | Status: DC | PRN
  Administered 2023-11-20: 650 mg via ORAL
  Filled 2023-11-20: qty 2

## 2023-11-20 SURGICAL SUPPLY — 15 items
CABLE SURGICAL S-101-97-12 (CABLE) ×1 IMPLANT
CATH CPS LOCATOR 3D MED (CATHETERS) IMPLANT
HELIX LOCKING TOOL (MISCELLANEOUS) ×1 IMPLANT
LEAD ULTIPACE 52 LPA1231/52 (Lead) IMPLANT
LEAD ULTIPACE 65 LPA1231/65 (Lead) IMPLANT
PACEMAKER ASSURITY DR-RF (Pacemaker) IMPLANT
PACK LOOP INSERTION (CUSTOM PROCEDURE TRAY) ×1 IMPLANT
PAD DEFIB RADIO PHYSIO CONN (PAD) ×1 IMPLANT
SHEATH 7FR PRELUDE SNAP 13 (SHEATH) IMPLANT
SHEATH 9FR PRELUDE SNAP 13 (SHEATH) IMPLANT
SLITTER AGILIS HISPRO (INSTRUMENTS) IMPLANT
TOOL HELIX LOCKING (MISCELLANEOUS) IMPLANT
TRAY PACEMAKER INSERTION (PACKS) ×1 IMPLANT
WIRE HI TORQ VERSACORE-J 145CM (WIRE) IMPLANT
WIRE MICRO SET SILHO 5FR 7 (SHEATH) IMPLANT

## 2023-11-20 NOTE — Discharge Instructions (Signed)
 After Your Pacemaker   You have a Medtronic Pacemaker  ACTIVITY Do not lift your arm above shoulder height for 1 week after your procedure. After 7 days, you may progress as below.  You should remove your sling 24 hours after your procedure, unless otherwise instructed by your provider.     Wednesday November 27, 2023  Thursday November 28, 2023 Friday November 29, 2023 Saturday November 30, 2023   Do not lift, push, pull, or carry anything over 10 pounds with the affected arm until 6 weeks (Wednesday January 01, 2024 ) after your procedure.   You may drive AFTER your wound check, unless you have been told otherwise by your provider.   Ask your healthcare provider when you can go back to work   INCISION/Dressing If you are on a blood thinner such as Coumadin, Xarelto, Eliquis, Plavix, or Pradaxa please confirm with your provider when this should be resumed.   If large square, outer bandage is left in place, this can be removed after 24 hours from your procedure. Do not remove steri-strips or glue as below.   If a PRESSURE DRESSING (a bulky dressing that usually goes up over your shoulder) was applied or left in place, please follow instructions given by your provider on when to return to have this removed.   Monitor your Pacemaker site for redness, swelling, and drainage. Call the device clinic at [phone] if you experience these symptoms or fever/chills.  If your incision is sealed with Steri-strips or staples, you may shower 7 days after your procedure or when told by your provider. Do not remove the steri-strips or let the shower hit directly on your site. You may wash around your site with soap and water.    If you were discharged in a sling, please do not wear this during the day more than 48 hours after your surgery unless otherwise instructed. This may increase the risk of stiffness and soreness in your shoulder.   Avoid lotions, ointments, or perfumes over your incision until it is  well-healed.  You may use a hot tub or a pool AFTER your wound check appointment if the incision is completely closed.  Pacemaker Alerts:  Some alerts are vibratory and others beep. These are NOT emergencies. Please call our office to let us know. If this occurs at night or on weekends, it can wait until the next business day. Send a remote transmission.  If your device is capable of reading fluid status (for heart failure), you will be offered monthly monitoring to review this with you.   DEVICE MANAGEMENT Remote monitoring is used to monitor your pacemaker from home. This monitoring is scheduled every 91 days by our office. It allows [place] to keep an eye on the functioning of your device to ensure it is working properly. You will routinely see your Electrophysiologist annually (more often if necessary).   You should receive your ID card for your new device in 4-8 weeks. Keep this card with you at all times once received. Consider wearing a medical alert bracelet or necklace.  Your Pacemaker may be MRI compatible. This will be discussed at your next office visit/wound check.  You should avoid contact with strong electric or magnetic fields.   Do not use amateur (ham) radio equipment or electric (arc) welding torches. MP3 player headphones with magnets should not be used. Some devices are safe to use if held at least 12 inches (30 cm) from your Pacemaker. These include power tools, lawn  mowers, and speakers. If you are unsure if something is safe to use, ask your health care provider.  When using your cell phone, hold it to the ear that is on the opposite side from the Pacemaker. Do not leave your cell phone in a pocket over the Pacemaker.  You may safely use electric blankets, heating pads, computers, and microwave ovens.  Call the office right away if: You have chest pain. You feel more short of breath than you have felt before. You feel more light-headed than you have felt before. Your  incision starts to open up.  This information is not intended to replace advice given to you by your health care provider. Make sure you discuss any questions you have with your health care provider.

## 2023-11-20 NOTE — Interval H&P Note (Signed)
 History and Physical Interval Note:  11/20/2023 2:37 PM  Amanda Bennett  has presented today for surgery, with the diagnosis of BRADICARDIA.  The various methods of treatment have been discussed with the patient and family. After consideration of risks, benefits and other options for treatment, the patient has consented to  Procedure(s): PACEMAKER IMPLANT - DUAL CHAMBER (N/A) LOOP RECORDER REMOVAL (N/A) as a surgical intervention.  The patient's history has been reviewed, patient examined, no change in status, stable for surgery.  I have reviewed the patient's chart and labs.  Questions were answered to the patient's satisfaction.     Roberts Gaudy Amir Fick

## 2023-11-21 ENCOUNTER — Encounter (HOSPITAL_COMMUNITY): Payer: Self-pay | Admitting: Cardiovascular Disease

## 2023-11-27 ENCOUNTER — Ambulatory Visit

## 2023-12-04 ENCOUNTER — Ambulatory Visit

## 2023-12-05 ENCOUNTER — Ambulatory Visit: Attending: Cardiology

## 2023-12-05 DIAGNOSIS — I441 Atrioventricular block, second degree: Secondary | ICD-10-CM

## 2023-12-05 LAB — CUP PACEART INCLINIC DEVICE CHECK
Battery Remaining Longevity: 140 mo
Battery Voltage: 3.05 V
Brady Statistic RA Percent Paced: 1.3 %
Brady Statistic RV Percent Paced: 0.04 %
Date Time Interrogation Session: 20250327170800
Implantable Lead Connection Status: 753985
Implantable Lead Connection Status: 753985
Implantable Lead Implant Date: 20250312
Implantable Lead Implant Date: 20250312
Implantable Lead Location: 753859
Implantable Lead Location: 753860
Implantable Pulse Generator Implant Date: 20250312
Lead Channel Impedance Value: 412.5 Ohm
Lead Channel Impedance Value: 462.5 Ohm
Lead Channel Pacing Threshold Amplitude: 0.5 V
Lead Channel Pacing Threshold Amplitude: 0.5 V
Lead Channel Pacing Threshold Amplitude: 1 V
Lead Channel Pacing Threshold Amplitude: 1 V
Lead Channel Pacing Threshold Pulse Width: 0.5 ms
Lead Channel Pacing Threshold Pulse Width: 0.5 ms
Lead Channel Pacing Threshold Pulse Width: 0.5 ms
Lead Channel Pacing Threshold Pulse Width: 0.5 ms
Lead Channel Sensing Intrinsic Amplitude: 1.1 mV
Lead Channel Sensing Intrinsic Amplitude: 4.1 mV
Lead Channel Setting Pacing Amplitude: 3.5 V
Lead Channel Setting Pacing Amplitude: 3.5 V
Lead Channel Setting Pacing Pulse Width: 0.5 ms
Lead Channel Setting Sensing Sensitivity: 0.5 mV
Pulse Gen Model: 2272
Pulse Gen Serial Number: 8258233

## 2023-12-05 NOTE — Patient Instructions (Signed)

## 2023-12-05 NOTE — Progress Notes (Signed)
 Normal dual chamber pacemaker wound check. Presenting rhythm: AS/VS 82. Wound well healed including loop recorder extraction site. Routine testing performed. Thresholds, sensing, and impedances consistent with implant measurements and at 3.5V safety margin/auto capture until 3 month visit. AMS episode noted was FF oversensing.  Reprogrammed atrial sensitivity from auto to 0.60mV. Reviewed arm restrictions to continue for 6 weeks total post op.  Pt enrolled in remote follow-up.

## 2023-12-09 ENCOUNTER — Encounter: Payer: Self-pay | Admitting: Certified Registered Nurse Anesthetist

## 2023-12-10 NOTE — Progress Notes (Signed)
 [employer]

## 2023-12-11 NOTE — Progress Notes (Unsigned)
 [facility] History and Physical   Primary Care Physician:  Merri Brunette, MD   Reason for Procedure:   diarrhea  Plan:    colonoscopy     HPI: Amanda Bennett is a 68 y.o. female here to evaluate chronic diarrhea.  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   Anxiety    Bruises easily    Cervical spinal stenosis 2008   Chronic constipation    Chronic pain syndrome    neck and lower back   COPD (chronic obstructive pulmonary disease) (HCC)    Depression    Family history of adverse reaction to anesthesia    mother--  ponv   GERD (gastroesophageal reflux disease)    History of basal cell carcinoma excision    History of prolonged Q-T interval on ECG    per cardiologist, dr Kirke Corin, note in epic-- in the setting of treatment with medications that can cause prolonged QT, went back to normal after stopping the medications (was seen by dr Graciela Husbands no further workup recommended)   History of staph infection    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   Hypertension    Insomnia    Lesion of ulnar nerve    Mild carotid artery disease (HCC)    per duplex 08-23-2017  bilateral ICA 1-39%   OA (osteoarthritis)    neck and back   Occlusion of left vertebral artery    noted 05-28-2017 MRI;  followed by cardiologist, dr Kirke Corin (per cardiologist has collaterals)   Osteoporosis    Perirectal abscess 07/08/2018   PONV (postoperative nausea and vomiting)    sometimes queezy   Primary localized osteoarthrosis, pelvic region and thigh    Wears dentures    upper   Wears glasses     Past Surgical History:  Procedure Laterality Date   ABDOMINAL HYSTERECTOMY  age 35s   CARPAL TUNNEL RELEASE Bilateral 2018   CHOLECYSTECTOMY     CHOLECYSTECTOMY  10/30/2011   Procedure:  LAPAROSCOPIC CHOLECYSTECTOMY WITH INTRAOPERATIVE CHOLANGIOGRAM;  Surgeon: Wilmon Arms. Corliss Skains, MD;  Location: [facility];  Service: General;  Laterality: N/A;   COLONOSCOPY     DORSAL COMPARTMENT RELEASE Left 07/20/2015   Procedure: RELEASE DORSAL COMPARTMENT (DEQUERVAIN);  Surgeon: Christena Flake, MD;  Location: [facility];  Service: Orthopedics;  Laterality: Left;   EVALUATION UNDER ANESTHESIA WITH FISTULECTOMY N/A 10/02/2018   Procedure: ANAL EXAM UNDER ANESTHESIA;  Surgeon: Romie Levee, MD;  Location: [facility];  Service: General;  Laterality: N/A;   HEMORRHOID SURGERY  11/25/2006   dr Lurene Shadow  @[facility]    I & D, ORIF RIGHT RING FINGER  Oct 9th, 15th ,2009   near amputation from dog bite   INCISION AND DRAINAGE PERIRECTAL ABSCESS N/A 07/08/2018   Procedure: IRRIGATION AND DEBRIDEMENT PERIRECTAL ABSCESS;  Surgeon: Sung Amabile, DO;  Location: [facility];  Service: General;  Laterality: N/A;   INGUINAL LYMPH NODE BIOPSY Right 02/03/2014   Procedure: INGUINAL LYMPH NODE BIOPSY;  Surgeon: Liz Malady, MD;  Location: [facility];  Service: General;  Laterality: Right;  right inguinal area   LAPAROSCOPIC SALPINGOOPHERECTOMY Bilateral age 71s   LIGATION OF INTERNAL FISTULA TRACT N/A 04/01/2019   Procedure: LIGATION OF  INTERNAL FISTULA TRACT RESECTION OF ANAL LESION;  Surgeon: Romie Levee, MD;  Location: [facility];  Service: General;  Laterality: N/A;   LOOP RECORDER REMOVAL N/A 11/20/2023   Procedure: LOOP RECORDER REMOVAL;  Surgeon: Maurice Small, MD;  Location: [facility];  Service: Cardiovascular;  Laterality: N/A;   ORIF CLAVICLE FRACTURE Right 2003   HARDWARE REMOVAL 08-22-2004 BY DR NORRIS @[facility]    PACEMAKER IMPLANT N/A 11/20/2023   Procedure: PACEMAKER IMPLANT - DUAL CHAMBER;  Surgeon: Maurice Small, MD;  Location: [facility];  Service: Cardiovascular;  Laterality: N/A;   PLACEMENT OF SETON N/A 10/02/2018    Procedure: PLACEMENT OF SETON VS FISTULOTOMY, BIOPSY;  Surgeon: Romie Levee, MD;  Location: [facility];  Service: General;  Laterality: N/A;   SHOULDER ARTHROSCOPY Left 07/20/2015   Procedure: ARTHROSCOPY SHOULDER DEBRIDEMENT AND DECOMPRESSION;  Surgeon: Christena Flake, MD;  Location: [facility];  Service: Orthopedics;  Laterality: Left;   SHOULDER ARTHROSCOPY Left 01/12/2016   Procedure: ARTHROSCOPY SHOULDER WITH DEBRIDEMENT AND EXCISION OF THE DISTAL CLAVICLE;  Surgeon: Christena Flake, MD;  Location: [facility];  Service: Orthopedics;  Laterality: Left;    Prior to Admission medications   Medication Sig Start Date End Date Taking? Authorizing Provider  amoxicillin-clavulanate (AUGMENTIN) 875-125 MG tablet Take 1 tablet by mouth every 12 (twelve) hours. Patient not taking: Reported on 11/11/2023 11/01/23   Horton, Clabe Seal, DO  ASPIRIN 81 PO Take 81 mg by mouth daily.    [provider]  dicyclomine (BENTYL) 20 MG tablet Take 20 mg by mouth every 4 (four) hours as needed for spasms (abd pain).    [provider]  Naproxen Sod-diphenhydrAMINE (ALEVE PM) 220-25 MG TABS Take 2 tablets by mouth at bedtime.    [provider]  omeprazole (PRILOSEC) 20 MG capsule Take 20 mg by mouth daily.    [provider]  ondansetron (ZOFRAN) 4 MG tablet Take 1 tablet (4 mg total) by mouth every 6 (six) hours. 11/01/23   Horton, Clabe Seal, DO  traMADol (ULTRAM) 50 MG tablet Take 1 tablet (50 mg total) by mouth every 12 (twelve) hours as needed. 11/01/23   Horton, Danford Bad M, DO  valsartan (DIOVAN) 40 MG tablet Take 1 tablet (40 mg total) by mouth daily. 11/20/23 11/19/24  Mealor, Roberts Gaudy, MD  zolpidem (AMBIEN CR) 6.25 MG CR tablet Take 6.25 mg by mouth at bedtime as needed for sleep. 06/13/23   [provider]    Current Outpatient Medications  Medication Sig Dispense Refill   amoxicillin-clavulanate (AUGMENTIN) 875-125 MG tablet Take 1 tablet by  mouth every 12 (twelve) hours. (Patient not taking: Reported on 11/11/2023) 14 tablet 0   ASPIRIN 81 PO Take 81 mg by mouth daily.     dicyclomine (BENTYL) 20 MG tablet Take 20 mg by mouth every 4 (four) hours as needed for spasms (abd pain).     Naproxen Sod-diphenhydrAMINE (ALEVE PM) 220-25 MG TABS Take 2 tablets by mouth at bedtime.     omeprazole (PRILOSEC) 20 MG capsule Take 20 mg by mouth daily.     ondansetron (ZOFRAN) 4 MG tablet Take 1 tablet (4 mg total) by mouth every 6 (six) hours. 20 tablet 0   traMADol (ULTRAM) 50 MG tablet Take 1 tablet (50 mg total) by mouth every 12 (twelve) hours as needed. 10 tablet 0   valsartan (DIOVAN) 40 MG tablet Take 1 tablet (40 mg total) by mouth daily. 30  tablet 2   zolpidem (AMBIEN CR) 6.25 MG CR tablet Take 6.25 mg by mouth at bedtime as needed for sleep.     No current facility-administered medications for this visit.    Allergies as of 12/12/2023 - Review Complete 11/20/2023  Allergen Reaction Noted   Fluoxetine hcl Other (See Comments) 11/15/2015   Lisinopril Cough 01/06/2016   Alendronate-cholecalciferol Other (See Comments) 11/01/2023   Statins  01/08/2017    Family History  Problem Relation Age of Onset   Anxiety disorder Mother    Hyperlipidemia Mother    Hypertension Mother    Valvular heart disease Mother    Coronary artery disease Mother    Dementia Mother    Stomach cancer Father    Prostate cancer Father    Colon cancer Father 84       deceased at age 38 from colon cancer   Pancreatic cancer Father    Esophageal cancer Father    Liver disease Father    Cancer Paternal Grandmother        ovarian   Stroke Neg Hx     Social History   Socioeconomic History   Marital status: Single    Spouse name: Not on file   Number of children: Not on file   Years of education: Not on file   Highest education level: Not on file  Occupational History   Occupation: Horse Trainer  Tobacco Use   Smoking status: Former    Types:  Cigarettes   Smokeless tobacco: Never  Vaping Use   Vaping status: Former  Substance and Sexual Activity   Alcohol use: Not Currently   Drug use: No   Sexual activity: Not on file  Other Topics Concern   Not on file  Social History Narrative   ** Merged History Encounter **       Disabled 2 daughters Reports single marital status Smoker, 2 caffeinated beverages a day no other tobacco no drug use no alcohol   Social Drivers of Corporate investment banker Strain: Not on file  Food Insecurity: Not on file  Transportation Needs: Not on file  Physical Activity: Not on file  Stress: Not on file  Social Connections: Not on file  Intimate Partner Violence: Not on file    Review of Systems: Positive for *** All other review of systems negative except as mentioned in the HPI.  Physical Exam: Vital signs LMP 10/23/2002   General:   Alert,  Well-developed, well-nourished, pleasant and cooperative in NAD Lungs:  Clear throughout to auscultation.   Heart:  Regular rate and rhythm; no murmurs, clicks, rubs,  or gallops. Abdomen:  Soft, nontender and nondistended. Normal bowel sounds.   Neuro/Psych:  Alert and cooperative. Normal mood and affect. A and O x 3   @Davarious Tumbleson  Sena Slate, MD, [facility] [phone] (pager) 12/11/2023 8:39 PM@

## 2023-12-12 ENCOUNTER — Encounter: Payer: Self-pay | Admitting: Internal Medicine

## 2023-12-12 ENCOUNTER — Ambulatory Visit: Admitting: Internal Medicine

## 2023-12-12 ENCOUNTER — Ambulatory Visit (HOSPITAL_BASED_OUTPATIENT_CLINIC_OR_DEPARTMENT_OTHER): Payer: 59 | Attending: Cardiology | Admitting: Cardiology

## 2023-12-12 VITALS — BP 130/75 | HR 61 | Temp 97.5°F | Resp 15 | Ht 64.0 in | Wt 125.0 lb

## 2023-12-12 VITALS — Ht 64.0 in | Wt 127.0 lb

## 2023-12-12 DIAGNOSIS — R197 Diarrhea, unspecified: Secondary | ICD-10-CM

## 2023-12-12 DIAGNOSIS — K5730 Diverticulosis of large intestine without perforation or abscess without bleeding: Secondary | ICD-10-CM | POA: Diagnosis not present

## 2023-12-12 DIAGNOSIS — K648 Other hemorrhoids: Secondary | ICD-10-CM

## 2023-12-12 DIAGNOSIS — R0683 Snoring: Secondary | ICD-10-CM | POA: Insufficient documentation

## 2023-12-12 DIAGNOSIS — K6289 Other specified diseases of anus and rectum: Secondary | ICD-10-CM

## 2023-12-12 DIAGNOSIS — I10 Essential (primary) hypertension: Secondary | ICD-10-CM | POA: Insufficient documentation

## 2023-12-12 DIAGNOSIS — G4733 Obstructive sleep apnea (adult) (pediatric): Secondary | ICD-10-CM | POA: Insufficient documentation

## 2023-12-12 MED ORDER — SODIUM CHLORIDE 0.9 % IV SOLN
500.0000 mL | Freq: Once | INTRAVENOUS | Status: DC
Start: 2023-12-12 — End: 2023-12-12

## 2023-12-12 NOTE — Progress Notes (Signed)
 Report given to PACU, vss

## 2023-12-12 NOTE — Op Note (Signed)
 [facility] Patient Name: Amanda Bennett Procedure Date: 12/12/2023 1:27 PM MRN: [id number] Endoscopist: Iva Boop , MD, [id number] Age: 68 Referring MD:  Date of Birth: Apr 05, 1956 Gender: Female Account #: [id number] Procedure:                Colonoscopy Indications:              Clinically significant diarrhea of unexplained                            origin Medicines:                Monitored Anesthesia Care Procedure:                Pre-Anesthesia Assessment:                           - Prior to the procedure, a History and Physical                            was performed, and patient medications and                            allergies were reviewed. The patient's tolerance of                            previous anesthesia was also reviewed. The risks                            and benefits of the procedure and the sedation                            options and risks were discussed with the patient.                            All questions were answered, and informed consent                            was obtained. Prior Anticoagulants: The patient has                            taken no anticoagulant or antiplatelet agents. ASA                            Grade Assessment: II - A patient with mild systemic                            disease. After reviewing the risks and benefits,                            the patient was deemed in satisfactory condition to                            undergo the procedure.  After obtaining informed consent, the colonoscope                            was passed under direct vision. Throughout the                            procedure, the patient's blood pressure, pulse, and                            oxygen saturations were monitored continuously. The                            Olympus Scope SN: [id number] was introduced through                            the anus and advanced to the the cecum, identified                             by appendiceal orifice and ileocecal valve. The                            colonoscopy was performed without difficulty. The                            patient tolerated the procedure well. The quality                            of the bowel preparation was good. The ileocecal                            valve, appendiceal orifice, and rectum were                            photographed. Scope In: 1:34:13 PM Scope Out: 1:47:32 PM Scope Withdrawal Time: 0 hours 8 minutes 19 seconds  Total Procedure Duration: 0 hours 13 minutes 19 seconds  Findings:                 The perianal and digital rectal examinations were                            normal.                           Multiple diverticula were found in the sigmoid                            colon.                           Internal hemorrhoids were found.                           The terminal ileum appeared normal.  The exam was otherwise without abnormality on                            direct and retroflexion views.                           Biopsies for histology were taken with a cold                            forceps from the entire colon for evaluation of                            microscopic colitis. Estimated blood loss was                            minimal. Complications:            No immediate complications. Estimated Blood Loss:     Estimated blood loss was minimal. Impression:               - Diverticulosis in the sigmoid colon.                           - Internal hemorrhoids. Also saw hemorrhoidectomy                            scars.                           - The examined portion of the ileum was normal.                           - The examination was otherwise normal on direct                            and retroflexion views.                           - Biopsies were taken with a cold forceps from the                            entire colon for evaluation of  microscopic colitis. Recommendation:           - Patient has a contact number available for                            emergencies. The signs and symptoms of potential                            delayed complications were discussed with the                            patient. Return to normal activities tomorrow.                            Written discharge instructions were provided to the  patient.                           - Resume previous diet.                           - Continue present medications. NEEDS TO TRY TO                            PROMOTOE MORE RGEULAR DEFECATION W/ MIRALAX DAILY                            AND PRN LAXATIVE LIKE DULCOLAX AFTER 2 DAYS OF NO                            STOOL. DID HAVE RECENT SEVERE DIARRHEA THAT WAS                            PROTRACTED BUT NOW WITH MIXED DEFECATION PATTERN.                           - Await pathology results.                           - Repeat colonoscopy in 5 years for screening                            purposes. father had CRCA < 60 Iva Boop, MD 12/12/2023 1:58:09 PM This report has been signed electronically.

## 2023-12-12 NOTE — Patient Instructions (Addendum)
 I did not see any inflammation but took biopsies to check.  I think you need to work on moving your bowels more regularly. Treating the constipation can prevent the diarrhea episodes.  Take Miralax daily and try not to go more than 2 days without a bowel movement - you can use your laxative at the end of second day of no bowel movement.  I appreciate the opportunity to care for you. Iva Boop, MD, [facility]  -Handout on diverticulosis and hemorrhoids provided -await pathology results -repeat colonoscopy for surveillance in 5 years recommended  -Continue present medications    YOU HAD AN ENDOSCOPIC PROCEDURE TODAY AT THE [facility]:   Refer to the procedure report that was given to you for any specific questions about what was found during the examination.  If the procedure report does not answer your questions, please call your gastroenterologist to clarify.  If you requested that your care partner not be given the details of your procedure findings, then the procedure report has been included in a sealed envelope for you to review at your convenience later.  YOU SHOULD EXPECT: Some feelings of bloating in the abdomen. Passage of more gas than usual.  Walking can help get rid of the air that was put into your GI tract during the procedure and reduce the bloating. If you had a lower endoscopy (such as a colonoscopy or flexible sigmoidoscopy) you may notice spotting of blood in your stool or on the toilet paper. If you underwent a bowel prep for your procedure, you may not have a normal bowel movement for a few days.  Please Note:  You might notice some irritation and congestion in your nose or some drainage.  This is from the oxygen used during your procedure.  There is no need for concern and it should clear up in a day or so.  SYMPTOMS TO REPORT IMMEDIATELY:  Following lower endoscopy (colonoscopy or flexible sigmoidoscopy):  Excessive amounts of blood in the  stool  Significant tenderness or worsening of abdominal pains  Swelling of the abdomen that is new, acute  Fever of 100F or higher  For urgent or emergent issues, a gastroenterologist can be reached at any hour by calling [phone]. Do not use MyChart messaging for urgent concerns.    DIET:  We do recommend a small meal at first, but then you may proceed to your regular diet.  Drink plenty of fluids but you should avoid alcoholic beverages for 24 hours.  ACTIVITY:  You should plan to take it easy for the rest of today and you should NOT DRIVE or use heavy machinery until tomorrow (because of the sedation medicines used during the test).    FOLLOW UP: Our staff will call the number listed on your records the next business day following your procedure.  We will call around 7:15- 8:00 am to check on you and address any questions or concerns that you may have regarding the information given to you following your procedure. If we do not reach you, we will leave a message.     If any biopsies were taken you will be contacted by phone or by letter within the next 1-3 weeks.  Please call us at [phone] if you have not heard about the biopsies in 3 weeks.    SIGNATURES/CONFIDENTIALITY: You and/or your care partner have signed paperwork which will be entered into your electronic medical record.  These signatures attest to the fact that that the  information above on your After Visit Summary has been reviewed and is understood.  Full responsibility of the confidentiality of this discharge information lies with you and/or your care-partner.

## 2023-12-13 ENCOUNTER — Telehealth: Payer: Self-pay | Admitting: *Deleted

## 2023-12-13 NOTE — Procedures (Signed)
   [facility] [address] [place], [place] [address] Tel: [phone]   Fax: [phone]  Titration Interpretation  Patient Name:  Amanda Bennett, Amanda Bennett Date:  12/12/2023 Referring Physician:  Armanda Magic, MD  Indications for Polysomnography The patient is a 68 year-old Female who is 5\' 4"  and weighs 127.0 lbs. Her BMI equals 22.0.  A full night titration treatment study was performed.  Medication was administered at 2016.  Mirtazapine    Polysomnogram Data A full night polysomnogram recorded the standard physiologic parameters including EEG, EOG, EMG, EKG, nasal and oral airflow.  Respiratory parameters of chest and abdominal movements were recorded with Respiratory Inductance Plethysmography belts.  Oxygen saturation was recorded by pulse oximetry.   Sleep Architecture The total recording time of the polysomnogram was 407.6 minutes.  The total sleep time was 342.5 minutes.  The patient spent 3.5% of total sleep time in Stage N1, 82.5% in Stage N2, 0.0% in Stages N3, and 14.0% in REM.  Sleep latency was 10.3 minutes.  REM latency was 147.0 minutes.  Sleep Efficiency was 84.0%.  Wake after Sleep Onset time was 54.5 minutes.  Titration Summary The patient was titrated at pressures ranging from 7 cm/H20 to 9 cm/H20. The last pressure used in the study was 9 cm/H20-.  Respiratory Events The polysomnogram revealed a presence of 0 obstructive, 0 central, and 0 mixed apneas resulting in an Apnea index of 0 events per hour.  There were 9 hypopneas (>=3% desaturation and/or arousal) resulting in an Apnea\Hypopnea Index (AHI >=3% desaturation and/or arousal) of 1.6 events per hour.  There were 6 hypopneas (>=4% desaturation) resulting in an Apnea\Hypopnea Index (AHI >=4% desaturation) of 1.1 events per hour.  There were 9 Respiratory Effort Related Arousals resulting in a RERA index of 1.6 events per hour. The Respiratory Disturbance Index is 3.2 events per  hour.  The snore index was 32.1 events per hour.  Mean oxygen saturation was 95.6%.  The lowest oxygen saturation during sleep was 89.0%.  Time spent <=88% oxygen saturation was 0.1 minutes.  Limb Activity There were 10 limb movements recorded.  Of this total, 4 were classified as PLMs.  Of the PLMs, 4 were associated with arousals.  The Limb Movement index was 1.8 per hour while the PLM index was 0.7 per hour.  Cardiac Summary The average pulse rate was 61.4 bpm.  The minimum pulse rate was 59.0 bpm while the maximum pulse rate was 83.0 bpm.  Cardiac rhythm was normal  Diagnosis:  Obstructive Sleep Apnea  Recommendations: Recommend a trial of ResMed CPAP at 9cm H2O with heated humidity , EPR of 2 and small to medium Evora Full Face mask.  The patient should be counseled in good sleep hygiene. The patient should be counseled to avoid sleepy supine.  Encourage patient to avoid driving while sleep. Followup in Sleep Medicine Office in 6 weeks.   This study was personally reviewed and electronically signed by: Armanda Magic, MD Accredited Board Certified in Sleep Medicine Date/Time: 12/13/2023 12:44PM

## 2023-12-13 NOTE — Telephone Encounter (Signed)
 Left message on f/u call

## 2023-12-16 ENCOUNTER — Encounter: Payer: Self-pay | Admitting: Cardiovascular Disease

## 2023-12-17 ENCOUNTER — Telehealth: Payer: Self-pay | Admitting: *Deleted

## 2023-12-17 DIAGNOSIS — R0683 Snoring: Secondary | ICD-10-CM

## 2023-12-17 DIAGNOSIS — G4733 Obstructive sleep apnea (adult) (pediatric): Secondary | ICD-10-CM

## 2023-12-17 LAB — SURGICAL PATHOLOGY

## 2023-12-17 NOTE — Telephone Encounter (Signed)
 The patient has been notified of the result and verbalized understanding.  All questions (if any) were answered. Latrelle Dodrill, CMA 12/17/2023 6:45 PM    Upon patient request DME selection is [facility] Patient understands he will be contacted by [facility] to set up his cpap. Patient understands to call if [facility] does not contact him with new setup in a timely manner. Patient understands they will be called once confirmation has been received from [facility] that they have received their new machine to schedule 10 week follow up appointment.   [facility] notified of new cpap order  Please add to airview Patient was grateful for the call and thanked me.

## 2023-12-17 NOTE — Telephone Encounter (Signed)
-----   Message from Armanda Magic sent at 12/13/2023 12:45 PM EDT ----- Please let patient know that they had a successful PAP titration and let DME know that orders are in EPIC.  Please set up 6 week OV with me.

## 2023-12-19 ENCOUNTER — Encounter: Payer: Self-pay | Admitting: Internal Medicine

## 2024-01-07 ENCOUNTER — Telehealth: Payer: Self-pay

## 2024-01-07 ENCOUNTER — Ambulatory Visit (INDEPENDENT_AMBULATORY_CARE_PROVIDER_SITE_OTHER)

## 2024-01-07 DIAGNOSIS — R55 Syncope and collapse: Secondary | ICD-10-CM | POA: Diagnosis not present

## 2024-01-07 LAB — CUP PACEART REMOTE DEVICE CHECK
Battery Remaining Longevity: 98 mo
Battery Remaining Percentage: 95.5 %
Battery Voltage: 3.05 V
Brady Statistic AP VP Percent: 1 %
Brady Statistic AP VS Percent: 5.5 %
Brady Statistic AS VP Percent: 1 %
Brady Statistic AS VS Percent: 94 %
Brady Statistic RA Percent Paced: 5.4 %
Brady Statistic RV Percent Paced: 1 %
Date Time Interrogation Session: 20250429020013
Implantable Lead Connection Status: 753985
Implantable Lead Connection Status: 753985
Implantable Lead Implant Date: 20250312
Implantable Lead Implant Date: 20250312
Implantable Lead Location: 753859
Implantable Lead Location: 753860
Implantable Pulse Generator Implant Date: 20250312
Lead Channel Impedance Value: 430 Ohm
Lead Channel Impedance Value: 460 Ohm
Lead Channel Pacing Threshold Amplitude: 0.5 V
Lead Channel Pacing Threshold Amplitude: 1 V
Lead Channel Pacing Threshold Pulse Width: 0.5 ms
Lead Channel Pacing Threshold Pulse Width: 0.5 ms
Lead Channel Sensing Intrinsic Amplitude: 1.3 mV
Lead Channel Sensing Intrinsic Amplitude: 4.5 mV
Lead Channel Setting Pacing Amplitude: 3.5 V
Lead Channel Setting Pacing Amplitude: 3.5 V
Lead Channel Setting Pacing Pulse Width: 0.5 ms
Lead Channel Setting Sensing Sensitivity: 0.5 mV
Pulse Gen Model: 2272
Pulse Gen Serial Number: 8258233

## 2024-01-07 NOTE — Telephone Encounter (Signed)
 Pt states she had not heard back about her cpap machine in 3 weeks. She has questionas and would like for Dr. Arlester Ladd nurse to give her a call back.

## 2024-01-08 NOTE — Telephone Encounter (Signed)
 Spoke with patient, would like a status update on being able to get a CPAP machine. Apologized for the delay in care. Will forward to our sleep team for further assistance.

## 2024-01-10 NOTE — Telephone Encounter (Signed)
 Notified patient that original DME company that CPAP order was sent to I not in network with patient's insurance. Order has been sent to [facility] today.

## 2024-01-20 ENCOUNTER — Encounter: Payer: Self-pay | Admitting: Cardiovascular Disease

## 2024-01-31 NOTE — Telephone Encounter (Signed)
 Pt calling back wanting to know the status of her CPAP machine and she is not happy that it has been over a month. Pt would like a call back today

## 2024-02-11 ENCOUNTER — Telehealth: Payer: Self-pay | Admitting: Cardiovascular Disease

## 2024-02-11 DIAGNOSIS — M4316 Spondylolisthesis, lumbar region: Secondary | ICD-10-CM | POA: Diagnosis not present

## 2024-02-11 DIAGNOSIS — M7062 Trochanteric bursitis, left hip: Secondary | ICD-10-CM | POA: Diagnosis not present

## 2024-02-11 NOTE — Telephone Encounter (Signed)
Please call and assist pt.

## 2024-02-11 NOTE — Telephone Encounter (Signed)
 Patient is calling back stating she is still having issues with her sleep study.

## 2024-02-12 NOTE — Telephone Encounter (Signed)
 Patient is following up. She says she spoke with [facility] and they informed her that an order was never placed and she doesn't owe anything. She would like a call back to clarify. Please advise.

## 2024-02-12 NOTE — Telephone Encounter (Signed)
 DME IS [facility] Patient is in collections with [facility]. Patient states she owes $13 for a hand brace and she will take care of that balance this week so that she can get her cpap machine.

## 2024-02-18 ENCOUNTER — Encounter: Admitting: Pulmonary Disease

## 2024-02-21 NOTE — Progress Notes (Signed)
 Remote pacemaker transmission.

## 2024-02-23 NOTE — Progress Notes (Unsigned)
  Electrophysiology Office Note:   Date:  02/24/2024  ID:  Amanda Bennett, DOB [birth year], MRN [id number]  Primary Cardiologist: Antionette Kirks, MD Primary Heart Failure: None Electrophysiologist: Efraim Grange, MD       History of Present Illness:   Amanda Bennett is a 68 y.o. female with h/o symptomatic bradycardia s/p PPM, paroxysmal AVB,  COPD seen today for routine electrophysiology follow-up s/p Pacemaker implant.  Since last being seen in our clinic the patient reports doing very well post pacemaker. Site well healed, no device related concerns.    She denies chest pain, palpitations, dyspnea, PND, orthopnea, nausea, vomiting, dizziness, syncope, edema, weight gain, or early satiety.    Review of systems complete and found to be negative unless listed in HPI.   EP Information / Studies Reviewed:    EKG is ordered today. Personal review as below.  EKG Interpretation Date/Time:  Monday February 24 2024 11:20:41 EDT Ventricular Rate:  72 PR Interval:  150 QRS Duration:  96 QT Interval:  390 QTC Calculation: 427 R Axis:   42  Text Interpretation: Normal sinus rhythm Normal ECG Confirmed by Creighton Doffing ([id number]) on 02/24/2024 11:26:08 AM   PPM Interrogation-  reviewed in detail today,  See PACEART report.  Device History: Abbott Dual Chamber PPM implanted 11/20/23 for Symptomatic bradycardia  Studies:  ECHO 03/2023 > LVEF 60-65%, G1DD, no significant VHD Cardiac Monitor 03/2023 > SR HR 22-139 bpm, ave 84 bpm, multiple nocturnal pauses with high grade AVB & SND    Risk Assessment/Calculations:      STOP-Bang Score:  5       Physical Exam:   VS:  BP (!) 144/94   Pulse 63   Ht 5' 4 (1.626 m)   Wt 135 lb 6.4 oz (61.4 kg)   LMP 10/23/2002   SpO2 95%   BMI 23.24 kg/m    Wt Readings from Last 3 Encounters:  02/24/24 135 lb 6.4 oz (61.4 kg)  12/12/23 127 lb (57.6 kg)  12/12/23 125 lb (56.7 kg)     GEN: Well nourished, well developed in no acute distress NECK:  No JVD; No carotid bruits CARDIAC: Regular rate and rhythm, no murmurs, rubs, gallops, site well healed, no tethering RESPIRATORY:  Clear to auscultation without rales, wheezing or rhonchi  ABDOMEN: Soft, non-tender, non-distended EXTREMITIES:  No edema; No deformity   ASSESSMENT AND PLAN:    Syncope in setting of Symptomatic bradycardia s/p Abbott PPM  Nocturnal Pauses  -Normal PPM function -See Pace Art report -pt had PMT with auto capture testing, measured V-A conduction at 320 ms, PVART increased to 325 ms to avoid PMT. Auto capture not turned on. Outputs changed to chronic settings.   OSA  -pending CPAP    Tobacco Abuse  -pt reportedly quit smoking, congratulated her on cessation  -she asks about Trelogy for her COPD, discussed reviewing PFT's and medications with her PCP or referral to Pulmonary  Disposition:   Follow up with Dr. Arlester Ladd in 12 months  Signed, Creighton Doffing, NP-C, AGACNP-BC [facility] - Electrophysiology  02/24/2024, 12:07 PM

## 2024-02-24 ENCOUNTER — Encounter: Payer: Self-pay | Admitting: Pulmonary Disease

## 2024-02-24 ENCOUNTER — Ambulatory Visit: Attending: Pulmonary Disease | Admitting: Pulmonary Disease

## 2024-02-24 VITALS — BP 144/94 | HR 63 | Ht 64.0 in | Wt 135.4 lb

## 2024-02-24 DIAGNOSIS — I441 Atrioventricular block, second degree: Secondary | ICD-10-CM | POA: Diagnosis not present

## 2024-02-24 DIAGNOSIS — Z950 Presence of cardiac pacemaker: Secondary | ICD-10-CM

## 2024-02-24 DIAGNOSIS — R55 Syncope and collapse: Secondary | ICD-10-CM | POA: Diagnosis not present

## 2024-02-24 LAB — CUP PACEART INCLINIC DEVICE CHECK
Battery Remaining Longevity: 140 mo
Battery Voltage: 3.04 V
Brady Statistic RA Percent Paced: 3.8 %
Brady Statistic RV Percent Paced: 0.04 %
Date Time Interrogation Session: 20250616120202
Implantable Lead Connection Status: 753985
Implantable Lead Connection Status: 753985
Implantable Lead Implant Date: 20250312
Implantable Lead Implant Date: 20250312
Implantable Lead Location: 753859
Implantable Lead Location: 753860
Implantable Pulse Generator Implant Date: 20250312
Lead Channel Impedance Value: 437.5 Ohm
Lead Channel Impedance Value: 462.5 Ohm
Lead Channel Pacing Threshold Amplitude: 0.5 V
Lead Channel Pacing Threshold Amplitude: 0.5 V
Lead Channel Pacing Threshold Amplitude: 1 V
Lead Channel Pacing Threshold Amplitude: 1 V
Lead Channel Pacing Threshold Pulse Width: 0.5 ms
Lead Channel Pacing Threshold Pulse Width: 0.5 ms
Lead Channel Pacing Threshold Pulse Width: 0.5 ms
Lead Channel Pacing Threshold Pulse Width: 0.5 ms
Lead Channel Sensing Intrinsic Amplitude: 1.6 mV
Lead Channel Sensing Intrinsic Amplitude: 4.1 mV
Lead Channel Setting Pacing Amplitude: 2 V
Lead Channel Setting Pacing Amplitude: 2.5 V
Lead Channel Setting Pacing Pulse Width: 0.5 ms
Lead Channel Setting Sensing Sensitivity: 0.5 mV
Pulse Gen Model: 2272
Pulse Gen Serial Number: 8258233

## 2024-02-25 ENCOUNTER — Ambulatory Visit: Payer: Self-pay | Admitting: Cardiovascular Disease

## 2024-02-26 ENCOUNTER — Telehealth: Payer: Self-pay | Admitting: Cardiovascular Disease

## 2024-02-26 MED ORDER — VALSARTAN 40 MG PO TABS: 40.0000 mg | ORAL_TABLET | Freq: Every day | ORAL | 3 refills | Status: DC

## 2024-02-26 NOTE — Telephone Encounter (Signed)
 Pt's medication was sent to pt's pharmacy as requested. Confirmation received.

## 2024-02-26 NOTE — Telephone Encounter (Signed)
*  STAT* If patient is at the pharmacy, call can be transferred to refill team.   1. Which medications need to be refilled? (please list name of each medication and dose if known)   valsartan  (DIOVAN ) 40 MG tablet   2. Which pharmacy/location (including street and city if local pharmacy) is medication to be sent to? [id number] [place], [place] - [address] Phone: [phone]  Fax: [phone]      3. Do they need a 30 day or 90 day supply? 90   Pt is out of medication

## 2024-02-27 DIAGNOSIS — G4733 Obstructive sleep apnea (adult) (pediatric): Secondary | ICD-10-CM | POA: Diagnosis not present

## 2024-03-10 ENCOUNTER — Ambulatory Visit: Attending: Cardiovascular Disease | Admitting: Cardiovascular Disease

## 2024-03-10 ENCOUNTER — Encounter: Payer: Self-pay | Admitting: Cardiovascular Disease

## 2024-03-10 VITALS — BP 164/101 | HR 85 | Ht 64.0 in | Wt 131.4 lb

## 2024-03-10 DIAGNOSIS — Z950 Presence of cardiac pacemaker: Secondary | ICD-10-CM | POA: Diagnosis not present

## 2024-03-10 DIAGNOSIS — E785 Hyperlipidemia, unspecified: Secondary | ICD-10-CM | POA: Diagnosis not present

## 2024-03-10 DIAGNOSIS — I10 Essential (primary) hypertension: Secondary | ICD-10-CM | POA: Diagnosis not present

## 2024-03-10 MED ORDER — EZETIMIBE 10 MG PO TABS: 10.0000 mg | ORAL_TABLET | Freq: Every day | ORAL | 3 refills | Status: DC

## 2024-03-10 MED ORDER — VALSARTAN 80 MG PO TABS: 80.0000 mg | ORAL_TABLET | Freq: Every day | ORAL | 3 refills | Status: AC

## 2024-03-10 NOTE — Patient Instructions (Addendum)
 Medication Instructions:  Your physician recommends the following medication changes.  STOP TAKING: Aspirin   START TAKING: Ezetimibe  (zetia ) 10 mg once daily  INCREASE: Valsartan  to 80 mg once daily   *If you need a refill on your cardiac medications before your next appointment, please call your pharmacy*  Lab Work: Your provider would like for you to return in 2 months to have the following labs drawn: Fasting Lipid and Liver. You do not need an appointment for the lab. The lab is located on the first floor at [address] [facility]. The lab is open from 8:00 am to 4:30 pm  You may also go to any of these [facility] locations:   [facility] - [address] ([facility]) - [address] [phone] [address]     If you have labs (blood work) drawn today and your tests are completely normal, you will receive your results only by: [employer] (if you have MyChart) OR A paper copy in the mail If you have any lab test that is abnormal or we need to change your treatment, we will call you to review the results.  Testing/Procedures: None ordered  Follow-Up: At [facility], you and your health needs are our priority.  As part of our continuing mission to provide you with exceptional heart care, our providers are all part of one team.  This team includes your primary Cardiologist (physician) and Advanced Practice Providers or APPs (Physician Assistants and Nurse Practitioners) who all work together to provide you with the care you need, when you need it.  Your next appointment:   12 month(s)  Provider:   Deatrice Cage, MD    We recommend signing up for the patient portal called MyChart.  Sign up information is provided on this After Visit Summary.  MyChart is used to connect with patients for Virtual Visits (Telemedicine).  Patients are able to view lab/test results, encounter notes, upcoming appointments, etc.  Non-urgent  messages can be sent to your provider as well.   To learn more about what you can do with MyChart, go to [email or web address].

## 2024-03-10 NOTE — Progress Notes (Signed)
 Cardiology Office Note   Date:  03/10/2024   ID:  Amanda Bennett, DOB 1956/06/04, MRN [id number]  PCP:  Clarice Nottingham, MD  Cardiologist:   Deatrice Cage, MD   No chief complaint on file.     History of Present Illness: Amanda Bennett is a 68 y.o. female who presents for a follow up visit regarding hypertension, hyperlipidemia, high-grade AV block status post pacemaker placement and previously prolonged QT interval.  She has history of prior tobacco use, hypertension, anxiety and hepatitis C. she quit smoking in 2016. She has family history of prolonged QT syndrome and she did have mildly prolonged QT on EKG in the setting of treatment with medications that can cause that. QT went back to normal after stopping the medications. She was seen by Dr. Fernande and no further workup was recommended. Nuclear stress test in 2015 was normal. Previous CT scan did show evidence of coronary atherosclerosis. Echocardiogram in May 2018 showed normal LV systolic function and atrial septal aneurysm.  She has known history of hyperlipidemia and did not tolerate treatment with atorvastatin  or rosuvastatin  due to myalgia.    She had an MRI of cervical spine which showed occluded left vertebral artery.  She has no prior history of stroke.  She underwent cardiac CTA in November 2022 which showed a calcium  score of 0 with no evidence of obstructive disease.  Echocardiogram at that time showed normal LV systolic function with only mild mitral valve prolapse.  She had worsening palpitations associated with episodes of transient vision loss and dizziness last year.  She underwent an echocardiogram which showed normal LV systolic function with no significant valvular abnormalities.  She had an outpatient monitor done which showed 23 episodes of short SVT.  She was also noted to have 6 pauses the longest lasted 8.9 seconds likely with high-grade AV block.  These episodes happened in the early morning hours  presumably during sleep. Carotid Doppler showed mild nonobstructive carotid disease with known chronically occluded left vertebral artery. Lower extremity arterial Doppler showed normal ABI and toe pressure.  She was referred to EP.  A loop recorder was placed and subsequently her symptoms correlated with high-grade AV block.  She underwent pacemaker placement this year with resolution of her prior symptoms.  She feels extremely well.  No chest pain or shortness of breath.  No palpitations.  She does complain of easy bruising.    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   Anxiety    Bruises easily    Cervical spinal stenosis 2008   Chronic constipation    Chronic pain syndrome    neck and lower back   COPD (chronic obstructive pulmonary disease) (HCC)    Depression    Family history of adverse reaction to anesthesia    mother--  ponv   GERD (gastroesophageal reflux disease)    History of basal cell carcinoma excision    History of prolonged Q-T interval on ECG    per cardiologist, dr cage, note in epic-- in the setting of treatment with medications that can cause prolonged QT, went back to normal after stopping the medications (was seen by dr fernande no further workup recommended)   History of staph infection    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   Hypertension    Insomnia    Lesion of ulnar nerve    Mild carotid artery disease (  HCC)    per duplex 08-23-2017  bilateral ICA 1-39%   OA (osteoarthritis)    neck and back   Occlusion of left vertebral artery    noted 05-28-2017 MRI;  followed by cardiologist, dr darron (per cardiologist has collaterals)   Osteoporosis    Perirectal abscess 07/08/2018   PONV (postoperative nausea and vomiting)    sometimes queezy   Primary localized osteoarthrosis, pelvic region and thigh    Wears dentures    upper   Wears glasses     Past Surgical History:   Procedure Laterality Date   ABDOMINAL HYSTERECTOMY  age 49s   CARPAL TUNNEL RELEASE Bilateral 2018   CHOLECYSTECTOMY     CHOLECYSTECTOMY  10/30/2011   Procedure: LAPAROSCOPIC CHOLECYSTECTOMY WITH INTRAOPERATIVE CHOLANGIOGRAM;  Surgeon: Donnice POUR. Belinda, MD;  Location: [facility];  Service: General;  Laterality: N/A;   COLONOSCOPY     DORSAL COMPARTMENT RELEASE Left 07/20/2015   Procedure: RELEASE DORSAL COMPARTMENT (DEQUERVAIN);  Surgeon: Norleen JINNY Maltos, MD;  Location: [facility];  Service: Orthopedics;  Laterality: Left;   EVALUATION UNDER ANESTHESIA WITH FISTULECTOMY N/A 10/02/2018   Procedure: ANAL EXAM UNDER ANESTHESIA;  Surgeon: Debby Hila, MD;  Location: [facility];  Service: General;  Laterality: N/A;   HEMORRHOID SURGERY  11/25/2006   dr adel  @[facility]    I & D, ORIF RIGHT RING FINGER  Oct 9th, 15th ,2009   near amputation from dog bite   INCISION AND DRAINAGE PERIRECTAL ABSCESS N/A 07/08/2018   Procedure: IRRIGATION AND DEBRIDEMENT PERIRECTAL ABSCESS;  Surgeon: Tye Millet, DO;  Location: [facility];  Service: General;  Laterality: N/A;   INGUINAL LYMPH NODE BIOPSY Right 02/03/2014   Procedure: INGUINAL LYMPH NODE BIOPSY;  Surgeon: Dann FORBES Hummer, MD;  Location: [facility];  Service: General;  Laterality: Right;  right inguinal area   LAPAROSCOPIC SALPINGOOPHERECTOMY Bilateral age 31s   LIGATION OF INTERNAL FISTULA TRACT N/A 04/01/2019   Procedure: LIGATION OF INTERNAL FISTULA TRACT RESECTION OF ANAL LESION;  Surgeon: Debby Hila, MD;  Location: [facility];  Service: General;  Laterality: N/A;   LOOP RECORDER REMOVAL N/A 11/20/2023   Procedure: LOOP RECORDER REMOVAL;  Surgeon: Nancey Eulas FORBES, MD;  Location: [facility];  Service: Cardiovascular;  Laterality: N/A;   ORIF CLAVICLE FRACTURE Right 2003   HARDWARE REMOVAL 08-22-2004 BY DR NORRIS @[facility]    PACEMAKER IMPLANT N/A 11/20/2023   Procedure: PACEMAKER IMPLANT -  DUAL CHAMBER;  Surgeon: Nancey Eulas FORBES, MD;  Location: [facility];  Service: Cardiovascular;  Laterality: N/A;   PLACEMENT OF SETON N/A 10/02/2018   Procedure: PLACEMENT OF SETON VS FISTULOTOMY, BIOPSY;  Surgeon: Debby Hila, MD;  Location: [facility];  Service: General;  Laterality: N/A;   SHOULDER ARTHROSCOPY Left 07/20/2015   Procedure: ARTHROSCOPY SHOULDER DEBRIDEMENT AND DECOMPRESSION;  Surgeon: Norleen JINNY Maltos, MD;  Location: [facility];  Service: Orthopedics;  Laterality: Left;   SHOULDER ARTHROSCOPY Left 01/12/2016   Procedure: ARTHROSCOPY SHOULDER WITH DEBRIDEMENT AND EXCISION OF THE DISTAL CLAVICLE;  Surgeon: Norleen JINNY Maltos, MD;  Location: [facility];  Service: Orthopedics;  Laterality: Left;     Current Outpatient Medications  Medication Sig Dispense Refill   diclofenac (VOLTAREN) 50 MG EC tablet Take 50 mg by mouth 2 (two) times daily.     dicyclomine  (BENTYL ) 20 MG tablet Take 20 mg by mouth every 4 (four) hours as needed for spasms (abd pain).     mirtazapine  (  REMERON ) 15 MG tablet TAKE 1 TABLET BY MOUTH EVERY DAY AT BEDTIME FOR 30 DAYS for 90     Naproxen  Sod-diphenhydrAMINE (ALEVE  PM) 220-25 MG TABS Take 2 tablets by mouth at bedtime.     omeprazole  (PRILOSEC) 20 MG capsule Take 20 mg by mouth daily.     ondansetron  (ZOFRAN ) 4 MG tablet Take 1 tablet (4 mg total) by mouth every 6 (six) hours. 20 tablet 0   traMADol  (ULTRAM ) 50 MG tablet Take 1 tablet (50 mg total) by mouth every 12 (twelve) hours as needed. 10 tablet 0   valsartan  (DIOVAN ) 40 MG tablet Take 1 tablet (40 mg total) by mouth daily. 90 tablet 3   zolpidem  (AMBIEN  CR) 6.25 MG CR tablet Take 6.25 mg by mouth at bedtime as needed for sleep.     No current facility-administered medications for this visit.    Allergies:   Alendronate -cholecalciferol , Fluoxetine hcl, Lisinopril, and Statins    Social History:  The patient  reports that she has quit smoking. Her smoking use included  cigarettes. She has never used smokeless tobacco. She reports that she does not currently use alcohol. She reports that she does not use drugs.   Family History:  The patient's family history includes Anxiety disorder in her mother; Cancer in her paternal grandmother; Colon cancer (age of onset: 34) in her father; Coronary artery disease in her mother; Dementia in her mother; Esophageal cancer in her father; Hyperlipidemia in her mother; Hypertension in her mother; Liver disease in her father; Pancreatic cancer in her father; Prostate cancer in her father; Rectal cancer in her father; Stomach cancer in her father; Valvular heart disease in her mother.    ROS:  Please see the history of present illness.   Otherwise, review of systems are positive for none.   All other systems are reviewed and negative.    PHYSICAL EXAM: VS:  BP (!) 164/101 (BP Location: Left Arm, Patient Position: Sitting)   Pulse 85   Ht 5' 4 (1.626 m)   Wt 131 lb 6.4 oz (59.6 kg)   LMP 10/23/2002   SpO2 95%   BMI 22.55 kg/m  , BMI Body mass index is 22.55 kg/m. GEN: Well nourished, well developed, in no acute distress  HEENT: normal  Neck: no JVD, or masses.  Right carotid bruit Cardiac: RRR; no murmurs, rubs, or gallops,no edema  Respiratory:  clear to auscultation bilaterally, normal work of breathing GI: soft, nontender, nondistended, + BS MS: no deformity or atrophy  Skin: warm and dry, no rash Neuro:  Strength and sensation are intact Psych: euthymic mood, full affect   EKG:  EKG is not ordered today.   Recent Labs: 04/11/2023: TSH 0.778 11/01/2023: ALT 14 11/08/2023: BUN 10; Creatinine, Ser 0.80; Hemoglobin 14.5; Platelets 307; Potassium 4.7; Sodium 139    Lipid Panel    Component Value Date/Time   CHOL 281 (H) 11/20/2016 1148   TRIG 234 (H) 11/20/2016 1148   HDL 60 11/20/2016 1148   CHOLHDL 4.7 11/20/2016 1148   VLDL 47 (H) 11/20/2016 1148   LDLCALC 174 (H) 11/20/2016 1148   LDLDIRECT 136 (H)  04/08/2012 1125      Wt Readings from Last 3 Encounters:  03/10/24 131 lb 6.4 oz (59.6 kg)  02/24/24 135 lb 6.4 oz (61.4 kg)  12/12/23 127 lb (57.6 kg)        ASSESSMENT AND PLAN:   1.  Intermittent nocturnal high-grade AV block status post dual-chamber pacemaker placement with resolution  of symptoms.  Continue follow-up with EP on a regular basis.  I asked her to stop taking aspirin  81 mg daily.  There is no indication.  2.  Essential hypertension: Blood pressure is elevated.  I increased valsartan .  3.  Bilateral leg pain : This does not seem to be due to peripheral arterial disease.  Recent ABI was normal.  4.  Hyperlipidemia: Likely familial with most recent LDL of 196.  Intolerance to statins including atorvastatin  and rosuvastatin .  She agreed to try ezetimibe  10 mg daily.  Check lipid and liver profile in 2 months.  5.  Sleep apnea: Currently using CPAP.   Disposition:   FU with me in 12 months  Signed,  Deatrice Cage, MD  03/10/2024 11:36 AM    [facility]

## 2024-03-17 DIAGNOSIS — R0902 Hypoxemia: Secondary | ICD-10-CM | POA: Diagnosis not present

## 2024-03-17 DIAGNOSIS — Z743 Need for continuous supervision: Secondary | ICD-10-CM | POA: Diagnosis not present

## 2024-03-17 DIAGNOSIS — S51812A Laceration without foreign body of left forearm, initial encounter: Secondary | ICD-10-CM | POA: Diagnosis not present

## 2024-03-17 DIAGNOSIS — F17210 Nicotine dependence, cigarettes, uncomplicated: Secondary | ICD-10-CM | POA: Diagnosis not present

## 2024-03-17 DIAGNOSIS — S0003XA Contusion of scalp, initial encounter: Secondary | ICD-10-CM | POA: Diagnosis not present

## 2024-03-17 DIAGNOSIS — M7989 Other specified soft tissue disorders: Secondary | ICD-10-CM | POA: Diagnosis not present

## 2024-03-17 DIAGNOSIS — Z23 Encounter for immunization: Secondary | ICD-10-CM | POA: Diagnosis not present

## 2024-03-17 DIAGNOSIS — S199XXA Unspecified injury of neck, initial encounter: Secondary | ICD-10-CM | POA: Diagnosis not present

## 2024-03-17 DIAGNOSIS — R58 Hemorrhage, not elsewhere classified: Secondary | ICD-10-CM | POA: Diagnosis not present

## 2024-03-17 DIAGNOSIS — S0990XA Unspecified injury of head, initial encounter: Secondary | ICD-10-CM | POA: Diagnosis not present

## 2024-03-17 DIAGNOSIS — M542 Cervicalgia: Secondary | ICD-10-CM | POA: Diagnosis not present
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
## 2024-03-26 ENCOUNTER — Encounter (HOSPITAL_COMMUNITY): Payer: Self-pay | Admitting: *Deleted

## 2024-03-26 ENCOUNTER — Ambulatory Visit (HOSPITAL_COMMUNITY)
Admission: EM | Admit: 2024-03-26 | Discharge: 2024-03-26 | Disposition: A | Discharge status: Home / Self Care | Attending: Family Medicine | Admitting: Family Medicine

## 2024-03-26 ENCOUNTER — Ambulatory Visit (INDEPENDENT_AMBULATORY_CARE_PROVIDER_SITE_OTHER)

## 2024-03-26 DIAGNOSIS — S52612A Displaced fracture of left ulna styloid process, initial encounter for closed fracture: Secondary | ICD-10-CM | POA: Diagnosis not present

## 2024-03-26 DIAGNOSIS — T148XXA Other injury of unspecified body region, initial encounter: Secondary | ICD-10-CM | POA: Diagnosis not present

## 2024-03-26 DIAGNOSIS — L089 Local infection of the skin and subcutaneous tissue, unspecified: Secondary | ICD-10-CM

## 2024-03-26 DIAGNOSIS — M25532 Pain in left wrist: Secondary | ICD-10-CM

## 2024-03-26 DIAGNOSIS — R600 Localized edema: Secondary | ICD-10-CM | POA: Diagnosis not present

## 2024-03-26 MED ORDER — ACETAMINOPHEN 325 MG PO TABS
ORAL_TABLET | ORAL | Status: AC
  Filled 2024-03-26: qty 3

## 2024-03-26 MED ORDER — MUPIROCIN 2 % EX OINT: 1.0000 | TOPICAL_OINTMENT | Freq: Two times a day (BID) | CUTANEOUS | 0 refills | Status: DC

## 2024-03-26 MED ORDER — AMOXICILLIN-POT CLAVULANATE 875-125 MG PO TABS
1.0000 | ORAL_TABLET | Freq: Two times a day (BID) | ORAL | 0 refills | Status: AC
Start: 2024-03-26 — End: 2024-04-02

## 2024-03-26 MED ORDER — ACETAMINOPHEN 325 MG PO TABS
975.0000 mg | ORAL_TABLET | Freq: Once | ORAL | Status: AC
  Administered 2024-03-26: 975 mg via ORAL

## 2024-03-26 NOTE — Discharge Instructions (Signed)
 The radiologist did not see any new broken bones.  They can see evidence of old broken bones and arthritis changes.  Put mupirocin  ointment on the sore areas twice daily until improved  Take amoxicillin -clavulanate 875 mg--1 tab twice daily with food for 7 days  Go see your hand specialist and follow-up with your primary care
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
## 2024-04-07 ENCOUNTER — Ambulatory Visit

## 2024-04-07 DIAGNOSIS — R55 Syncope and collapse: Secondary | ICD-10-CM

## 2024-04-07 LAB — CUP PACEART REMOTE DEVICE CHECK
Battery Remaining Longevity: 118 mo
Battery Remaining Percentage: 95.5 %
Battery Voltage: 3.02 V
Brady Statistic AP VP Percent: 1 %
Brady Statistic AP VS Percent: 1 %
Brady Statistic AS VP Percent: 1 %
Brady Statistic AS VS Percent: 99 %
Brady Statistic RA Percent Paced: 1 %
Brady Statistic RV Percent Paced: 1 %
Date Time Interrogation Session: 20250729072645
Implantable Lead Connection Status: 753985
Implantable Lead Connection Status: 753985
Implantable Lead Implant Date: 20250312
Implantable Lead Implant Date: 20250312
Implantable Lead Location: 753859
Implantable Lead Location: 753860
Implantable Pulse Generator Implant Date: 20250312
Lead Channel Impedance Value: 410 Ohm
Lead Channel Impedance Value: 440 Ohm
Lead Channel Pacing Threshold Amplitude: 0.5 V
Lead Channel Pacing Threshold Amplitude: 1 V
Lead Channel Pacing Threshold Pulse Width: 0.5 ms
Lead Channel Pacing Threshold Pulse Width: 0.5 ms
Lead Channel Sensing Intrinsic Amplitude: 1.9 mV
Lead Channel Sensing Intrinsic Amplitude: 5.6 mV
Lead Channel Setting Pacing Amplitude: 2 V
Lead Channel Setting Pacing Amplitude: 2.5 V
Lead Channel Setting Pacing Pulse Width: 0.5 ms
Lead Channel Setting Sensing Sensitivity: 0.5 mV
Pulse Gen Model: 2272
Pulse Gen Serial Number: 8258233

## 2024-04-10 DIAGNOSIS — G4733 Obstructive sleep apnea (adult) (pediatric): Secondary | ICD-10-CM | POA: Diagnosis not present

## 2024-04-20 ENCOUNTER — Ambulatory Visit: Payer: Self-pay | Admitting: Cardiovascular Disease

## 2024-05-29 DIAGNOSIS — G4733 Obstructive sleep apnea (adult) (pediatric): Secondary | ICD-10-CM | POA: Diagnosis not present

## 2024-06-04 DIAGNOSIS — G4733 Obstructive sleep apnea (adult) (pediatric): Secondary | ICD-10-CM | POA: Diagnosis not present

## 2024-06-10 DIAGNOSIS — M810 Age-related osteoporosis without current pathological fracture: Secondary | ICD-10-CM | POA: Diagnosis not present

## 2024-06-10 DIAGNOSIS — I2510 Atherosclerotic heart disease of native coronary artery without angina pectoris: Secondary | ICD-10-CM | POA: Diagnosis not present

## 2024-06-10 NOTE — Progress Notes (Signed)
 Remote PPM Transmission

## 2024-06-15 DIAGNOSIS — E875 Hyperkalemia: Secondary | ICD-10-CM | POA: Diagnosis not present

## 2024-06-15 DIAGNOSIS — Z0000 Encounter for general adult medical examination without abnormal findings: Secondary | ICD-10-CM | POA: Diagnosis not present

## 2024-06-15 DIAGNOSIS — I6523 Occlusion and stenosis of bilateral carotid arteries: Secondary | ICD-10-CM | POA: Diagnosis not present

## 2024-06-15 DIAGNOSIS — I2510 Atherosclerotic heart disease of native coronary artery without angina pectoris: Secondary | ICD-10-CM | POA: Diagnosis not present

## 2024-06-15 DIAGNOSIS — K219 Gastro-esophageal reflux disease without esophagitis: Secondary | ICD-10-CM | POA: Diagnosis not present

## 2024-06-15 DIAGNOSIS — M810 Age-related osteoporosis without current pathological fracture: Secondary | ICD-10-CM | POA: Diagnosis not present

## 2024-06-15 DIAGNOSIS — I700 Atherosclerosis of aorta: Secondary | ICD-10-CM | POA: Diagnosis not present

## 2024-06-15 DIAGNOSIS — I10 Essential (primary) hypertension: Secondary | ICD-10-CM | POA: Diagnosis not present

## 2024-06-15 DIAGNOSIS — J449 Chronic obstructive pulmonary disease, unspecified: Secondary | ICD-10-CM | POA: Diagnosis not present

## 2024-06-17 DIAGNOSIS — M5416 Radiculopathy, lumbar region: Secondary | ICD-10-CM | POA: Diagnosis not present

## 2024-06-17 DIAGNOSIS — M810 Age-related osteoporosis without current pathological fracture: Secondary | ICD-10-CM | POA: Diagnosis not present

## 2024-06-19 ENCOUNTER — Other Ambulatory Visit (HOSPITAL_COMMUNITY): Payer: Self-pay | Admitting: Pain Medicine

## 2024-06-19 DIAGNOSIS — M5416 Radiculopathy, lumbar region: Secondary | ICD-10-CM

## 2024-06-24 DIAGNOSIS — M25552 Pain in left hip: Secondary | ICD-10-CM | POA: Diagnosis not present

## 2024-07-04 DIAGNOSIS — G4733 Obstructive sleep apnea (adult) (pediatric): Secondary | ICD-10-CM | POA: Diagnosis not present

## 2024-07-07 ENCOUNTER — Ambulatory Visit (INDEPENDENT_AMBULATORY_CARE_PROVIDER_SITE_OTHER)

## 2024-07-07 DIAGNOSIS — R55 Syncope and collapse: Secondary | ICD-10-CM

## 2024-07-07 LAB — CUP PACEART REMOTE DEVICE CHECK
Battery Remaining Longevity: 117 mo
Battery Remaining Percentage: 95.5 %
Battery Voltage: 3.02 V
Brady Statistic AP VP Percent: 1 %
Brady Statistic AP VS Percent: 2.8 %
Brady Statistic AS VP Percent: 1 %
Brady Statistic AS VS Percent: 97 %
Brady Statistic RA Percent Paced: 2.7 %
Brady Statistic RV Percent Paced: 1 %
Date Time Interrogation Session: 20251028053212
Implantable Lead Connection Status: 753985
Implantable Lead Connection Status: 753985
Implantable Lead Implant Date: 20250312
Implantable Lead Implant Date: 20250312
Implantable Lead Location: 753859
Implantable Lead Location: 753860
Implantable Pulse Generator Implant Date: 20250312
Lead Channel Impedance Value: 410 Ohm
Lead Channel Impedance Value: 440 Ohm
Lead Channel Pacing Threshold Amplitude: 0.5 V
Lead Channel Pacing Threshold Amplitude: 1 V
Lead Channel Pacing Threshold Pulse Width: 0.5 ms
Lead Channel Pacing Threshold Pulse Width: 0.5 ms
Lead Channel Sensing Intrinsic Amplitude: 0.7 mV
Lead Channel Sensing Intrinsic Amplitude: 3.9 mV
Lead Channel Setting Pacing Amplitude: 2 V
Lead Channel Setting Pacing Amplitude: 2.5 V
Lead Channel Setting Pacing Pulse Width: 0.5 ms
Lead Channel Setting Sensing Sensitivity: 0.5 mV
Pulse Gen Model: 2272
Pulse Gen Serial Number: 8258233

## 2024-07-14 NOTE — Progress Notes (Signed)
 Remote PPM Transmission

## 2024-07-16 ENCOUNTER — Ambulatory Visit: Payer: Self-pay | Admitting: Cardiovascular Disease

## 2024-07-23 NOTE — CV Procedure (Signed)
  Device system confirmed to be MRI conditional, with implant date > 6 weeks ago, and no evidence of abandoned or epicardial leads in review of most recent CXR  Device last cleared by EP Provider: Charlies Arthur 07/23/24  Clearance is good through for 1 year as long as parameters remain stable at time of check. If pt undergoes a cardiac device procedure during that time, they should be re-cleared.   Tachy-therapies to be programmed off if applicable with device back to pre-MRI settings after completion of exam.  [employer] will be present for programming for the MRI.   Rocky Catalan, RT  07/23/2024 7:15 PM

## 2024-07-27 ENCOUNTER — Ambulatory Visit (HOSPITAL_COMMUNITY)
Admission: RE | Admit: 2024-07-27 | Discharge: 2024-07-27 | Disposition: A | Discharge status: Home / Self Care | Source: Ambulatory Visit | Attending: Pain Medicine | Admitting: Pain Medicine

## 2024-07-27 DIAGNOSIS — M5416 Radiculopathy, lumbar region: Secondary | ICD-10-CM | POA: Insufficient documentation

## 2024-07-30 ENCOUNTER — Telehealth: Payer: Self-pay | Admitting: Internal Medicine

## 2024-07-30 DIAGNOSIS — R197 Diarrhea, unspecified: Secondary | ICD-10-CM

## 2024-07-30 MED ORDER — AMOXICILLIN-POT CLAVULANATE 875-125 MG PO TABS: 1.0000 | ORAL_TABLET | Freq: Two times a day (BID) | ORAL | 0 refills | Status: AC

## 2024-07-30 NOTE — Telephone Encounter (Signed)
 Inbound call from patient stating that she has a IBS flare up along with diverticulitis flare up patient is requesting an antibiotic to be sent over to her [facility] on [address]. Patient is requesting a call back .Please advise.

## 2024-07-30 NOTE — Telephone Encounter (Signed)
 Spoke with pt. She agrees to the stool culture and C-diff and will come tomorrow to pick up the collection containers. Orders placed. Augmentin  sent to pharmacy. Pt states she will call back to schedule a f/u appt.

## 2024-07-30 NOTE — Telephone Encounter (Signed)
 Spoke with pt. She reports her symptoms are very similar to previous diverticulitis flairs. Reports LLQ pain. Has tried taking Dicyclomine , but it has not helped ease the pain. Reports massive amounts of diarrhea that looks like an infection. Has not taken any OTC medications for diarrhea. Pt reports that last time she had a flair Dr. Avram called in Augmentin  and that seemed to help.

## 2024-08-03 ENCOUNTER — Other Ambulatory Visit

## 2024-08-03 DIAGNOSIS — R197 Diarrhea, unspecified: Secondary | ICD-10-CM

## 2024-08-04 LAB — SPECIMEN STATUS REPORT

## 2024-08-04 LAB — CLOSTRIDIUM DIFFICILE BY PCR: Toxigenic C. Difficile by PCR: NEGATIVE

## 2024-08-05 ENCOUNTER — Ambulatory Visit: Payer: Self-pay | Admitting: Physician Assistant

## 2024-08-07 LAB — STOOL CULTURE: E coli, Shiga toxin Assay: NEGATIVE
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
## 2024-08-10 NOTE — Telephone Encounter (Signed)
 Patient states she does not have access to Mychart. Requesting a call regarding results. States she is still very sick. Please advise, thank you

## 2024-08-10 NOTE — Telephone Encounter (Signed)
 Pt reports that she has not been able to check her temperature because her thermometer was broken. She denies any blood in her stool. Pt will come in for OV tomorrow for further discussion with Camie to determine next best steps.

## 2024-08-11 ENCOUNTER — Ambulatory Visit: Admitting: Gastroenterology

## 2024-08-11 ENCOUNTER — Other Ambulatory Visit

## 2024-08-11 ENCOUNTER — Encounter: Payer: Self-pay | Admitting: Gastroenterology

## 2024-08-11 VITALS — BP 116/84 | HR 82 | Ht 64.0 in | Wt 129.0 lb

## 2024-08-11 DIAGNOSIS — K648 Other hemorrhoids: Secondary | ICD-10-CM

## 2024-08-11 DIAGNOSIS — R634 Abnormal weight loss: Secondary | ICD-10-CM

## 2024-08-11 DIAGNOSIS — K6289 Other specified diseases of anus and rectum: Secondary | ICD-10-CM

## 2024-08-11 DIAGNOSIS — Z8719 Personal history of other diseases of the digestive system: Secondary | ICD-10-CM

## 2024-08-11 DIAGNOSIS — R195 Other fecal abnormalities: Secondary | ICD-10-CM | POA: Diagnosis not present

## 2024-08-11 DIAGNOSIS — R110 Nausea: Secondary | ICD-10-CM

## 2024-08-11 DIAGNOSIS — K6282 Dysplasia of anus: Secondary | ICD-10-CM | POA: Diagnosis not present

## 2024-08-11 DIAGNOSIS — R197 Diarrhea, unspecified: Secondary | ICD-10-CM

## 2024-08-11 DIAGNOSIS — K603 Anal fistula, unspecified: Secondary | ICD-10-CM

## 2024-08-11 LAB — CBC WITH DIFFERENTIAL/PLATELET
Basophils Absolute: 0.1 K/uL (ref 0.0–0.1)
Basophils Relative: 0.7 % (ref 0.0–3.0)
Eosinophils Absolute: 0 K/uL (ref 0.0–0.7)
Eosinophils Relative: 0.4 % (ref 0.0–5.0)
HCT: 40.9 % (ref 36.0–46.0)
Hemoglobin: 14.2 g/dL (ref 12.0–15.0)
Lymphocytes Relative: 15.4 % (ref 12.0–46.0)
Lymphs Abs: 1.6 K/uL (ref 0.7–4.0)
MCHC: 34.7 g/dL (ref 30.0–36.0)
MCV: 96.7 fl (ref 78.0–100.0)
Monocytes Absolute: 0.8 K/uL (ref 0.1–1.0)
Monocytes Relative: 7.6 % (ref 3.0–12.0)
Neutro Abs: 7.9 K/uL — ABNORMAL HIGH (ref 1.4–7.7)
Neutrophils Relative %: 75.9 % (ref 43.0–77.0)
Platelets: 265 K/uL (ref 150.0–400.0)
RBC: 4.23 Mil/uL (ref 3.87–5.11)
RDW: 13.4 % (ref 11.5–15.5)
WBC: 10.5 K/uL (ref 4.0–10.5)

## 2024-08-11 LAB — COMPREHENSIVE METABOLIC PANEL WITH GFR
ALT: 11 U/L (ref 0–35)
AST: 15 U/L (ref 0–37)
Albumin: 4.5 g/dL (ref 3.5–5.2)
Alkaline Phosphatase: 73 U/L (ref 39–117)
BUN: 14 mg/dL (ref 6–23)
CO2: 29 meq/L (ref 19–32)
Calcium: 9.8 mg/dL (ref 8.4–10.5)
Chloride: 102 meq/L (ref 96–112)
Creatinine, Ser: 0.68 mg/dL (ref 0.40–1.20)
GFR: 90.7 mL/min (ref >60.00)
Glucose, Bld: 100 mg/dL — ABNORMAL HIGH (ref 70–99)
Potassium: 3.9 meq/L (ref 3.5–5.1)
Sodium: 137 meq/L (ref 135–145)
Total Bilirubin: 0.4 mg/dL (ref 0.2–1.2)
Total Protein: 7.4 g/dL (ref 6.0–8.3)

## 2024-08-11 MED ORDER — HYDROCORTISONE ACETATE 25 MG RE SUPP: 25.0000 mg | Freq: Two times a day (BID) | RECTAL | 0 refills | Status: AC

## 2024-08-11 NOTE — Progress Notes (Signed)
 Amanda Bennett [id number] 12/02/1955   Chief Complaint: Rectal pain, nausea, gas  Referring Provider: Clarice Nottingham, MD Primary GI MD: Dr. Avram  HPI: Amanda Bennett is a 68 y.o. female with past medical history of anal fistula and anal abscess, COPD, chronic pain syndrome, anxiety/depression, GERD, HTN, osteoporosis, hysterectomy, cholecystectomy, hemorrhoidectomy, pacemaker implant who presents today for a complaint of rectal pain, nausea, gas.    Per note 06/2020 by Dr. Avram:  Abscess began in October 2019 she had incision and drainage she had antibiotics and because it did not resolve she was taken to the operating room in January 2020 and had a seton placed and that is when she had the AIN biopsy results.  She had actually been seen at [facility] and had incision and drainage from a surgeon there also. 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  She had a follow-up visit in January of this year and there was no evidence of recurrence with plans for a 46-month follow-up.  Patient last seen in office 11/01/2023 by Delon Failing, PA-C for complaint of abdominal pain, weight loss, nausea vomiting, diarrhea, blood in stool.  Endorsed worsening abdominal pain over the previous 6 months despite use of dicyclomine .  Endorsed diarrhea sometimes 20-30 times per day with nausea and occasional vomiting as well as weight loss of 30 pounds.  Known IBS as well as bile salt diarrhea.  Endorsed hematochezia, possibly related to hemorrhoids versus colitis versus other.  Ultimately patient felt she could not manage at home anymore and was advised to go to [facility] ED for consideration of admission and workup with CT and possibly endoscopic evaluation. In the ED vitals and labs were unremarkable.  CT showed some diffuse colitis.  She was put on antibiotics and  scheduled for outpatient colonoscopy.  Underwent colonoscopy 12/12/2023 with finding of diverticulosis, internal hemorrhoids, otherwise normal.  Multiple biopsies taken for evaluation of microscopic colitis.  She was advised to continue present medications and try to promote more regular defecation with MiraLAX daily and as needed laxative such as Dulcolax.  Recommended 5-year repeat colonoscopy due to family history of colon cancer in her father.  Patient called 07/30/2024 reporting IBS flare along with diverticulitis flare, having LLQ pain and no improvement with dicyclomine .  Endorsed massive amounts of diarrhea.  She was prescribed Augmentin  875 mg twice daily for 7 days and advised to turn in stool study for C. difficile and culture.  Stool studies were negative for infection.   Patient called 08/10/2024 and reported still feeling poorly, intense abdominal pain, nausea but no vomiting.  No improvement with Augmentin .  Scheduled for next day office visit.  Follows with cardiology for hypertension, hyperlipidemia, high-grade AV block s/p pacemaker placement and previously prolonged QT interval.  Last office visit with them was 03/10/2024.  Plan at last visit was to follow-up in a year.   Discussed the use of AI scribe software for clinical note transcription with the patient, who gave verbal consent to proceed.  History of Present Illness Amanda Bennett is a 68 year old female who presents with severe rectal pain and mucus discharge.  Rectal pain - Severe rectal pain located approximately six inches into the rectum - Pain described as 'hurting so bad' and has been ongoing for years, recently becoming unbearable - Pain is primarily rectal, not abdominal -  Nausea and profuse sweating associated with pain - No fever or vomiting - Recent steroid injection in lower back provided temporary relief from rectal pain for three days - Pelvic floor physical therapy provided no benefit  Mucus  discharge - Significant amount of mucus discharge, especially in the mornings - Mucus described as similar to 'blowing my nose' - Yellow mucus seen when wiping - No blood in stool  Bowel habits - Up to fifteen bowel movements daily - Stools not always loose - Sensation of rectal swelling - Occasional difficulty passing bowel movements - Takes Dulcolax as needed to prevent constipation   Appetite and weight loss - Decreased appetite due to pain with bowel movements - Associated weight loss  Dietary modifications - Avoids beef and pork - Primarily consumes salmon - Drinks turmeric and ginger tea to avoid constipation, which helps with gas  Medication and bowel regimen - Not on a formal bowel regimen - Takes dicyclomine  twice daily, which does not alleviate rectal pain   Surgical and procedural history - 2016: Underwent surgeries for fissure and growth removal at [facility] - Colostomy bag placement for six months following surgery - Colonoscopy in April showed no rectal abnormalities despite ongoing pain  Spinal pathology - History of three slipped discs - Recent steroid injection in lower back, which did help temporarily with rectal pain    Denies any abdominal pain. Does not check temperature at home. No improvement in rectal pain or mucus with recent course of antibiotics.    Previous GI Procedures/Imaging   Colonoscopy 12/12/2023 - Diverticulosis in the sigmoid colon.  - Internal hemorrhoids. Also saw hemorrhoidectomy scars.  - The examined portion of the ileum was normal.  - The examination was otherwise normal on direct and retroflexion views.  - Biopsies were taken with a cold forceps from the entire colon for evaluation of microscopic colitis. - Recall 5 years Path: 1. Surgical [P], random colon biopsy sites :       -  COLONIC MUCOSA WITH NO SIGNIFICANT PATHOLOGY.   CT A/P 11/01/2023 IMPRESSION: 1. Diffuse colonic wall thickening, compatible with  inflammatory or infectious colitis. 2.  Aortic Atherosclerosis (ICD10-I70.0).  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   Anxiety    Bruises easily    Cervical spinal stenosis 2008   Chronic constipation    Chronic pain syndrome    neck and lower back   COPD (chronic obstructive pulmonary disease) (HCC)    Depression    Family history of adverse reaction to anesthesia    mother--  ponv   GERD (gastroesophageal reflux disease)    History of basal cell carcinoma excision    History of prolonged Q-T interval on ECG    per cardiologist, dr darron, note in epic-- in the setting of treatment with medications that can cause prolonged QT, went back to normal after stopping the medications (was seen by dr fernande no further workup recommended)   History of staph infection    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   Hypertension    Insomnia    Lesion of ulnar nerve    Mild carotid artery disease    per duplex 08-23-2017  bilateral ICA 1-39%   OA (osteoarthritis)    neck and back   Occlusion of left vertebral artery    noted 05-28-2017 MRI;  followed by cardiologist, dr darron (per cardiologist has collaterals)  Osteoporosis    Perirectal abscess 07/08/2018   PONV (postoperative nausea and vomiting)    sometimes queezy   Primary localized osteoarthrosis, pelvic region and thigh    Wears dentures    upper   Wears glasses     Past Surgical History:  Procedure Laterality Date   ABDOMINAL HYSTERECTOMY  age 14s   CARPAL TUNNEL RELEASE Bilateral 2018   CHOLECYSTECTOMY     CHOLECYSTECTOMY  10/30/2011   Procedure: LAPAROSCOPIC CHOLECYSTECTOMY WITH INTRAOPERATIVE CHOLANGIOGRAM;  Surgeon: Donnice POUR. Belinda, MD;  Location: [facility];  Service: General;  Laterality: N/A;   COLONOSCOPY     DORSAL COMPARTMENT RELEASE Left 07/20/2015   Procedure: RELEASE DORSAL COMPARTMENT (DEQUERVAIN);  Surgeon: Norleen JINNY Maltos, MD;  Location:  [facility];  Service: Orthopedics;  Laterality: Left;   EVALUATION UNDER ANESTHESIA WITH FISTULECTOMY N/A 10/02/2018   Procedure: ANAL EXAM UNDER ANESTHESIA;  Surgeon: Debby Hila, MD;  Location: [facility];  Service: General;  Laterality: N/A;   HEMORRHOID SURGERY  11/25/2006   dr adel  @[email or web address]    I & D, ORIF RIGHT RING FINGER  Oct 9th, 15th ,2009   near amputation from dog bite   INCISION AND DRAINAGE PERIRECTAL ABSCESS N/A 07/08/2018   Procedure: IRRIGATION AND DEBRIDEMENT PERIRECTAL ABSCESS;  Surgeon: Tye Millet, DO;  Location: [facility];  Service: General;  Laterality: N/A;   INGUINAL LYMPH NODE BIOPSY Right 02/03/2014   Procedure: INGUINAL LYMPH NODE BIOPSY;  Surgeon: Dann FORBES Hummer, MD;  Location: [facility];  Service: General;  Laterality: Right;  right inguinal area   LAPAROSCOPIC SALPINGOOPHERECTOMY Bilateral age 35s   LIGATION OF INTERNAL FISTULA TRACT N/A 04/01/2019   Procedure: LIGATION OF INTERNAL FISTULA TRACT RESECTION OF ANAL LESION;  Surgeon: Debby Hila, MD;  Location: [facility];  Service: General;  Laterality: N/A;   LOOP RECORDER REMOVAL N/A 11/20/2023   Procedure: LOOP RECORDER REMOVAL;  Surgeon: Nancey Eulas FORBES, MD;  Location: [facility];  Service: Cardiovascular;  Laterality: N/A;   ORIF CLAVICLE FRACTURE Right 2003   HARDWARE REMOVAL 08-22-2004 BY DR NORRIS @[facility]    PACEMAKER IMPLANT N/A 11/20/2023   Procedure: PACEMAKER IMPLANT - DUAL CHAMBER;  Surgeon: Nancey Eulas FORBES, MD;  Location: [facility];  Service: Cardiovascular;  Laterality: N/A;   PLACEMENT OF SETON N/A 10/02/2018   Procedure: PLACEMENT OF SETON VS FISTULOTOMY, BIOPSY;  Surgeon: Debby Hila, MD;  Location: [facility];  Service: General;  Laterality: N/A;   SHOULDER ARTHROSCOPY Left 07/20/2015   Procedure: ARTHROSCOPY SHOULDER DEBRIDEMENT AND DECOMPRESSION;  Surgeon: Norleen JINNY Maltos, MD;  Location: [facility];  Service: Orthopedics;  Laterality: Left;   SHOULDER ARTHROSCOPY Left 01/12/2016   Procedure: ARTHROSCOPY SHOULDER WITH DEBRIDEMENT AND EXCISION OF THE DISTAL CLAVICLE;  Surgeon: Norleen JINNY Maltos, MD;  Location: [facility];  Service: Orthopedics;  Laterality: Left;    Current Outpatient Medications  Medication Sig Dispense Refill   diclofenac (VOLTAREN) 50 MG EC tablet Take 50 mg by mouth 2 (two) times daily.     dicyclomine  (BENTYL ) 20 MG tablet Take 20 mg by mouth every 4 (four) hours as needed for spasms (abd pain).     mirtazapine  (REMERON ) 15 MG tablet TAKE 1 TABLET BY MOUTH EVERY DAY AT BEDTIME FOR 30 DAYS for 90     omeprazole  (PRILOSEC) 20 MG capsule Take 20 mg by mouth daily.     REPATHA SURECLICK 140 MG/ML SOAJ Inject 140 mg into the  skin every 14 (fourteen) days.     valsartan  (DIOVAN ) 80 MG tablet Take 1 tablet (80 mg total) by mouth daily. 90 tablet 3   No current facility-administered medications for this visit.    Allergies as of 08/11/2024 - Review Complete 03/26/2024  Allergen Reaction Noted   Alendronate -cholecalciferol  Other (See Comments) 11/01/2023   Fluoxetine hcl Other (See Comments) 11/15/2015   Lisinopril Cough 01/06/2016   Statins  01/08/2017    Family History  Problem Relation Age of Onset   Anxiety disorder Mother    Hyperlipidemia Mother    Hypertension Mother    Valvular heart disease Mother    Coronary artery disease Mother    Dementia Mother    Rectal cancer Father    Stomach cancer Father    Prostate cancer Father    Colon cancer Father 32       deceased at age 75 from colon cancer   Pancreatic cancer Father    Esophageal cancer Father    Liver disease Father    Cancer Paternal Grandmother        ovarian   Stroke Neg Hx     Social History   Tobacco Use   Smoking status: Former    Types: Cigarettes   Smokeless tobacco: Never  Vaping Use   Vaping status: Former  Substance Use Topics   Alcohol use: Not Currently   Drug use:  No     Review of Systems:    Constitutional: Weight loss. Episodes of sweating but no documented fever, no chills. Cardiovascular: No chest pain Respiratory: No SOB  Gastrointestinal: See HPI and otherwise negative   Physical Exam:  Vital signs: BP 116/84   Pulse 82   Ht 5' 4 (1.626 m)   Wt 129 lb (58.5 kg)   LMP 10/23/2002   SpO2 99%   BMI 22.14 kg/m   Wt Readings from Last 3 Encounters:  08/11/24 129 lb (58.5 kg)  03/10/24 131 lb 6.4 oz (59.6 kg)  02/24/24 135 lb 6.4 oz (61.4 kg)   Constitutional: Pleasant, well-appearing female in NAD, alert and cooperative Head:  Normocephalic and atraumatic.  Eyes: No scleral icterus.  Respiratory: Respirations even and unlabored. Lungs clear to auscultation bilaterally.  No wheezes, crackles, or rhonchi.  Cardiovascular:  Regular rate and rhythm. No murmurs. No peripheral edema. Gastrointestinal:  Soft, nondistended, minimal suprapubic tenderness to palpation. No rebound or guarding. Normal bowel sounds. No appreciable masses or hepatomegaly. Rectal:  Prolapsed internal hemorrhoids. No fissures seen. On anoscopy has some rectal swelling/internal hemorrhoids without bleeding. Small amount of clear/white mucus seen as well as brown stool. No clear evidence of abscess, no purulent discharge. Patient reports pain is further into rectum than what I am able to see on anoscopy. Chaperone present for exam. Neurologic:  Alert and oriented x4;  grossly normal neurologically.  Skin:   Dry and intact without significant lesions or rashes. Psychiatric: Oriented to person, place and time. Demonstrates good judgement and reason without abnormal affect or behaviors.   RELEVANT LABS AND IMAGING: CBC    Component Value Date/Time   WBC 7.7 11/08/2023 1628   WBC 8.4 11/01/2023 1203   RBC 4.53 11/08/2023 1628   RBC 4.13 11/01/2023 1203   HGB 14.5 11/08/2023 1628   HCT 43.0 11/08/2023 1628   PLT 307 11/08/2023 1628   MCV 95 11/08/2023 1628   MCH  32.0 11/08/2023 1628   MCH 32.7 11/01/2023 1203   MCHC 33.7 11/08/2023 1628   MCHC 34.3 11/01/2023  1203   RDW 12.3 11/08/2023 1628   LYMPHSABS 1.9 04/11/2023 1412   MONOABS 0.7 07/06/2022 1352   EOSABS 0.1 04/11/2023 1412   BASOSABS 0.1 04/11/2023 1412    CMP     Component Value Date/Time   NA 139 11/08/2023 1627   K 4.7 11/08/2023 1627   CL 101 11/08/2023 1627   CO2 25 11/08/2023 1627   GLUCOSE 90 11/08/2023 1627   GLUCOSE 104 (H) 11/01/2023 1203   BUN 10 11/08/2023 1627   CREATININE 0.80 11/08/2023 1627   CREATININE 0.64 11/20/2016 1148   CALCIUM  10.1 11/08/2023 1627   PROT 8.1 11/01/2023 1203   PROT 7.6 04/11/2023 1412   ALBUMIN 4.8 11/01/2023 1203   ALBUMIN 4.8 04/11/2023 1412   AST 24 11/01/2023 1203   ALT 14 11/01/2023 1203   ALKPHOS 85 11/01/2023 1203   BILITOT 0.6 11/01/2023 1203   BILITOT 0.3 04/11/2023 1412   GFRNONAA >60 11/01/2023 1203   GFRNONAA >89 11/17/2015 0742   GFRAA >60 03/23/2020 0144   GFRAA >89 11/17/2015 0742     Assessment/Plan:   Assessment & Plan Chronic rectal pain  History of anal intraepithelial neoplasia diagnosed January 2020 History of anorectal abscess and fistula Mucus in stool Internal hemorrhoids Chronic rectal pain with mucous discharge, worsening recently. Previous surgeries for anal fistula and abscess, as well as removal of AIN.  Previously followed with colorectal surgery, has not seen them in a while.  Recent colonoscopy 12/2023 without significant findings. Differential includes recurrent anal fistula/abscess or other rectal pathology. Augmentin  ineffective, steroid injection in lower back recently provided temporary relief.  Internal hemorrhoids and small amount of clear/white mucus seen on anoscopy today but no clear evidence of abscess and patient states that area of pain is further into rectum than what I am able to see on exam today.  This pain was present at last colonoscopy as well. Patient is very distressed by pain  and is concerned about recurrence of previous anorectal issues, has considered going back to surgeon.  Case and plan discussed with Dr. Avram in office today.  - Refer to surgery for further evaluation - Labs today: CBC, CMP - Order MRI pelvis to evaluate for possible abscess - Send Anusol  suppositories to use for 7 to 10 days, per rectum at night for internal hemorrhoids - Follow up 3 months with Dr. Avram  Chronic diarrhea  Frequent bowel movements, up to fifteen times daily. Stools not consistently loose, difficulty passing due to swelling. Dicyclomine  ineffective for rectal pain.  Not on any kind of regular bowel regimen at this time.  She is afraid to prevent diarrhea and cause constipation and further rectal pain with bowel movements.  Not seeing any blood in her stool.  Recent stool studies were negative for infection.  Random biopsies on last colonoscopy negative for any significant pathology.  She does use Dulcolax as needed if concerned for constipation.  - Continue Dulcolax as needed for constipation  Unintentional weight loss Continues to have weight loss.  Endorses decreased appetite due to rectal pain, fear of eating and worsening rectal discomfort.  Has nausea only associated with rectal pain, no vomiting.    Camie Furbish, PA-C [facility] 08/11/2024, 10:14 AM  Patient Care Team: Clarice Nottingham, MD as PCP - General (Internal Medicine) Darron Deatrice LABOR, MD as PCP - Cardiology (Cardiology) Mealor, Eulas BRAVO, MD as PCP - Electrophysiology (Cardiology) Salina Clarity, NP (Nurse Practitioner)

## 2024-08-11 NOTE — Patient Instructions (Addendum)
 You will be contacted by [facility] Scheduling in the next 2 days to arrange a MRI- Pelvis  The number on your caller ID will be [phone], please answer when they call.  If you have not heard from them in 2 days please call [phone] to schedule.     We are referring you to [facility]- Dr Belinda.  They will contact you directly to schedule an appointment.  It may take a week or more before you hear from them.  Please feel free to contact us  if you have not heard from them within 2 weeks and we will follow up on the referral.    Your provider has requested that you go to the basement level for lab work before leaving today. Press B on the elevator. The lab is located at the first door on the left as you exit the elevator.  We have sent the following medications to your pharmacy for you to pick up at your convenience: Anusol  Suppositories  Follow-up with Dr Avram in 3 months. Office will contact you to schedule.   Due to recent changes in healthcare laws, you may see the results of your imaging and laboratory studies on MyChart before your provider has had a chance to review them.  We understand that in some cases there may be results that are confusing or concerning to you. Not all laboratory results come back in the same time frame and the provider may be waiting for multiple results in order to interpret others.  Please give us  48 hours in order for your provider to thoroughly review all the results before contacting the office for clarification of your results.   _______________________________________________________  If your blood pressure at your visit was 140/90 or greater, please contact your primary care physician to follow up on this.  _______________________________________________________  If you are age 66 or older, your body mass index should be between 23-30. Your Body mass index is 22.14 kg/m. If this is out of the aforementioned range listed, please consider follow up  with your Primary Care Provider.  If you are age 25 or younger, your body mass index should be between 19-25. Your Body mass index is 22.14 kg/m. If this is out of the aformentioned range listed, please consider follow up with your Primary Care Provider.   ________________________________________________________  The [facility] providers would like to encourage you to use MYCHART to communicate with providers for non-urgent requests or questions.  Due to long hold times on the telephone, sending your provider a message by [facility] may be a faster and more efficient way to get a response.  Please allow 48 business hours for a response.  Please remember that this is for non-urgent requests.  _______________________________________________________  [facility] is using a team-based approach to care.  Your team is made up of your doctor and two to three APPS. Our APPS (Nurse Practitioners and Physician Assistants) work with your physician to ensure care continuity for you. They are fully qualified to address your health concerns and develop a treatment plan. They communicate directly with your gastroenterologist to care for you. Seeing the Advanced Practice Practitioners on your physician's team can help you by facilitating care more promptly, often allowing for earlier appointments, access to diagnostic testing, procedures, and other specialty referrals.   Thank you for choosing me and [facility].  Camie Furbish, PA-C

## 2024-08-12 ENCOUNTER — Ambulatory Visit: Payer: Self-pay | Admitting: Gastroenterology

## 2024-08-14 ENCOUNTER — Ambulatory Visit: Admitting: Physical Therapy

## 2024-08-14 ENCOUNTER — Other Ambulatory Visit: Payer: Self-pay

## 2024-08-14 ENCOUNTER — Encounter: Payer: Self-pay | Admitting: Physical Therapy

## 2024-08-14 DIAGNOSIS — M6281 Muscle weakness (generalized): Secondary | ICD-10-CM | POA: Insufficient documentation

## 2024-08-14 DIAGNOSIS — M79605 Pain in left leg: Secondary | ICD-10-CM | POA: Diagnosis present

## 2024-08-14 DIAGNOSIS — M79604 Pain in right leg: Secondary | ICD-10-CM | POA: Diagnosis present

## 2024-08-14 DIAGNOSIS — M5459 Other low back pain: Secondary | ICD-10-CM | POA: Insufficient documentation

## 2024-08-14 DIAGNOSIS — R2689 Other abnormalities of gait and mobility: Secondary | ICD-10-CM | POA: Insufficient documentation

## 2024-08-14 NOTE — Therapy (Signed)
 OUTPATIENT PHYSICAL THERAPY THORACOLUMBAR EVALUATION   Patient Name: Amanda Bennett MRN: [id number] DOB:06-25-1956, 68 y.o., female Today's Date: 08/14/2024  END OF SESSION:  PT End of Session - 08/14/24 0912     Visit Number 1    Number of Visits 11    Date for Recertification  10/09/24    Progress Note Due on Visit 10    PT Start Time 0915    PT Stop Time 1000    PT Time Calculation (min) 45 min    Activity Tolerance Patient tolerated treatment well    Behavior During Therapy [facility] for tasks assessed/performed          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   Anxiety    Bruises easily    Cervical spinal stenosis 2008   Chronic constipation    Chronic pain syndrome    neck and lower back   COPD (chronic obstructive pulmonary disease) (HCC)    Depression    Family history of adverse reaction to anesthesia    mother--  ponv   GERD (gastroesophageal reflux disease)    History of basal cell carcinoma excision    History of prolonged Q-T interval on ECG    per cardiologist, dr darron, note in epic-- in the setting of treatment with medications that can cause prolonged QT, went back to normal after stopping the medications (was seen by dr fernande no further workup recommended)   History of staph infection    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   Hypertension    Insomnia    Lesion of ulnar nerve    Mild carotid artery disease    per duplex 08-23-2017  bilateral ICA 1-39%   OA (osteoarthritis)    neck and back   Occlusion of left vertebral artery    noted 05-28-2017 MRI;  followed by cardiologist, dr darron (per cardiologist has collaterals)   Osteoporosis    Perirectal abscess 07/08/2018   PONV (postoperative nausea and vomiting)    sometimes queezy   Primary localized osteoarthrosis, pelvic region and thigh    Wears dentures    upper   Wears glasses    Past Surgical History:  Procedure  Laterality Date   ABDOMINAL HYSTERECTOMY  age 50s   CARPAL TUNNEL RELEASE Bilateral 2018   CHOLECYSTECTOMY     CHOLECYSTECTOMY  10/30/2011   Procedure: LAPAROSCOPIC CHOLECYSTECTOMY WITH INTRAOPERATIVE CHOLANGIOGRAM;  Surgeon: Donnice POUR. Belinda, MD;  Location: [facility];  Service: General;  Laterality: N/A;   COLONOSCOPY     DORSAL COMPARTMENT RELEASE Left 07/20/2015   Procedure: RELEASE DORSAL COMPARTMENT (DEQUERVAIN);  Surgeon: Norleen JINNY Maltos, MD;  Location: [facility];  Service: Orthopedics;  Laterality: Left;   EVALUATION UNDER ANESTHESIA WITH FISTULECTOMY N/A 10/02/2018   Procedure: ANAL EXAM UNDER ANESTHESIA;  Surgeon: Debby Hila, MD;  Location: [facility];  Service: General;  Laterality: N/A;   HEMORRHOID SURGERY  11/25/2006   dr adel  @[facility]    I & D, ORIF RIGHT RING FINGER  Oct 9th, 15th ,2009   near amputation from dog bite   INCISION AND DRAINAGE PERIRECTAL ABSCESS N/A 07/08/2018   Procedure: IRRIGATION AND DEBRIDEMENT PERIRECTAL ABSCESS;  Surgeon: Tye Millet, DO;  Location: [facility];  Service: General;  Laterality: N/A;   INGUINAL LYMPH NODE BIOPSY Right 02/03/2014   Procedure: INGUINAL LYMPH NODE BIOPSY;  Surgeon: Dann FORBES Hummer, MD;  Location:  [facility];  Service: General;  Laterality: Right;  right inguinal area   LAPAROSCOPIC SALPINGOOPHERECTOMY Bilateral age 48s   LIGATION OF INTERNAL FISTULA TRACT N/A 04/01/2019   Procedure: LIGATION OF INTERNAL FISTULA TRACT RESECTION OF ANAL LESION;  Surgeon: Debby Hila, MD;  Location: [facility];  Service: General;  Laterality: N/A;   LOOP RECORDER REMOVAL N/A 11/20/2023   Procedure: LOOP RECORDER REMOVAL;  Surgeon: Nancey Eulas BRAVO, MD;  Location: [facility];  Service: Cardiovascular;  Laterality: N/A;   ORIF CLAVICLE FRACTURE Right 2003   HARDWARE REMOVAL 08-22-2004 BY DR NORRIS @[facility]    PACEMAKER IMPLANT N/A 11/20/2023   Procedure: PACEMAKER IMPLANT - DUAL CHAMBER;   Surgeon: Nancey Eulas BRAVO, MD;  Location: [facility];  Service: Cardiovascular;  Laterality: N/A;   PLACEMENT OF SETON N/A 10/02/2018   Procedure: PLACEMENT OF SETON VS FISTULOTOMY, BIOPSY;  Surgeon: Debby Hila, MD;  Location: 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   Palpitations 10/01/2014   Tobacco abuse 07/23/2014   Mouth lesion 07/23/2014   Sore throat 07/23/2014   Postoperative wound infection 02/13/2014   Inguinal lymphadenopathy 12/30/2013   Family history of long QT syndrome 09/18/2013   Chest pain on exertion 09/18/2013   Rash and nonspecific skin eruption 09/18/2013   Leg pain 09/18/2013   Right groin pain 06/18/2013   Preventative health care 06/18/2013   CAFL (chronic airflow limitation) (HCC) 04/10/2013   BP (high blood pressure) 04/10/2013   Closed traumatic PIP dislocation 04/07/2013   Abnormality of gait 12/16/2012   Asthma 08/21/2012   Myofascial pain dysfunction syndrome 06/25/2012   Bursitis of hip, right 04/09/2012   Hypertension 04/09/2012   Headache 04/09/2012   Hyperlipidemia 04/09/2012   Low back pain 12/26/2011   Lumbar spondylosis 12/26/2011   Localized primary carpometacarpal osteoarthritis 11/07/2011   Chronic neck pain 11/07/2011   Chronic calculous cholecystitis 10/03/2011   Skin lesion of right arm 10/02/2011   Osteoporosis 10/02/2011   Gallstone 10/02/2011   Hot flash,  menopausal 01/17/2011   History of hepatitis C 06/15/2010   CARPAL TUNNEL SYNDROME, BILATERAL 12/06/2009   GERD 06/22/2009   Depression with anxiety 05/25/2009   DISC DISEASE, CERVICAL 05/25/2009   Insomnia 05/25/2009    PCP: Ryan Hives, MD  REFERRING PROVIDER: Deatrice Manus, MD  REFERRING DIAG:  Diagnosis  R29.898 (ICD-10-CM) - Other symptoms and signs involving the musculoskeletal system    Rationale for Evaluation and Treatment: Rehabilitation  THERAPY DIAG:  Other low back pain  Pain in right leg  Pain in left leg  Muscle weakness (generalized)  Other abnormalities of gait and mobility  ONSET DATE: 10+ years, chronic  SUBJECTIVE:  SUBJECTIVE STATEMENT: Pt states that in 2013 she noted an increase in low back pain without specific MOI, 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 She went through PT with some relief, then was able to go 10 years before follow up with MD for back pain. Pt felt that 6 months ago her back pain increased and has been overall worsening. Pt notes paraesthesia in BLE with symptoms radicular in nature to the toes bilaterally, L>R. Symptoms in the low back extend across the back.  Completing bridging, trunk rotation, toe touches, hamstring stretches. OH lat stretches. Symptoms range in intensity from 3/10-10/10. Pt uses heating pad which helps with symptoms. Low back and LLE constant, R leg intermittent. LLE symptoms will sometimes be centralized to the knee. Prolonged positioning inc pain and changing position helps with symptoms. Pt has fallen 3 times in the last 6 months when she loses her balance while walking. Had injection in L hip 6 weeks ago, some relief noted. Spinal injection 2 weeks ago, not significant improvement. Considering spinal stimulator  to help with symptoms.   PERTINENT HISTORY:  Pt has had chronic pain in her lumbar spine which has limited her ability to work as a paediatric nurse. Pt being followed by neuro with considerations for spinal stimulator  PAIN:  Are you having pain? Yes: NPRS scale: 7/10 Pain location: low back  Pain description: ache, sore Aggravating factors: static positioning Relieving factors: variable positioning   PRECAUTIONS: None  RED FLAGS: None   WEIGHT BEARING RESTRICTIONS: No  FALLS:  Has patient fallen in last 6 months? Yes. 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  DIAGNOSTIC FINDINGS:  See imaging section of chart  PATIENT SURVEYS:  Modified Oswestry:  MODIFIED OSWESTRY DISABILITY SCALE  Date: 08/14/24 Score  Pain intensity 2 =  Pain medication provides me with complete relief from pain.  2. Personal care (washing, dressing, etc.) 1 =  I can take care of myself normally, but it increases my pain.  3. Lifting 4 = I can lift only very light weights  4. Walking 3 =  Pain prevents me from walking more than  mile.  5. Sitting 4 =  Pain prevents me from sitting more than 10 minutes.  6. Standing 4 =  Pain prevents me from standing more than 10 minutes.  7. Sleeping 3 =  Even when I take pain medication, I sleep less than 4 hours.  8. Social Life 4 =  Pain has restricted my social life to my home  9. Traveling 4 = My pain restricts my travel to short necessary journeys under 1/2 hour.  10. Employment/ Homemaking 2 = I can perform most of my homemaking/job duties, but  pain prevents me from performing more physically stressful activities (eg, lifting, vacuuming).  Total 31/50   Interpretation of scores: Score Category Description  0-20% Minimal Disability The patient can cope with most living activities. Usually no treatment is indicated apart from advice on lifting, sitting and exercise  21-40% Moderate Disability The patient experiences more pain and difficulty with sitting, lifting and standing. Travel and  social life are more difficult and they may be disabled from work. Personal care, sexual activity and sleeping are not grossly affected, and the patient can usually be managed by conservative means  41-60% Severe Disability Pain remains the main problem in this group, but activities of daily living are affected. These patients require a detailed investigation  61-80% Crippled Back pain impinges on all aspects of the patient's life. Positive intervention is required  81-100% Bed-bound These patients are either bed-bound or exaggerating their symptoms  Bluford FORBES Zoe DELENA Karon DELENA, et al. Surgery versus conservative management of stable thoracolumbar fracture: the PRESTO feasibility RCT. [place] ([place]): [employer]; 2021 Nov. [facility] Technology Assessment, No. 25.62.) Appendix 3, Oswestry Disability Index category descriptors. Available from: Findjewelers.cz  Minimally Clinically Important Difference (MCID) = 12.8%  COGNITION: Overall cognitive status: Within functional limits for tasks assessed     SENSATION: WFL   POSTURE: rounded shoulders, forward head, and weight shift left  PALPATION: Inc TTP in the lumbar paraspinals and sacral region  LUMBAR ROM: Pt able to complete extension, side glide bilaterally without symptoms during, notes inc soreness following.   AROM eval  Flexion 25% loss  Extension Nil loss  Right lateral flexion Nil loss  Left lateral flexion Nil loss  Right rotation   Left  rotation    (Blank rows = not tested)  LOWER EXTREMITY ROM:     Active  Right eval Left eval  Hip flexion    Hip extension    Hip abduction    Hip adduction    Hip internal rotation    Hip external rotation    Knee flexion    Knee extension    Ankle dorsiflexion    Ankle plantarflexion    Ankle inversion    Ankle eversion     (Blank rows = not tested)  LOWER EXTREMITY MMT:    MMT Right eval Left eval  Hip flexion 4-/5 3+/5  Hip extension    Hip abduction 3+/5 3+/5  Hip adduction 4/5 4/5  Hip internal rotation    Hip external rotation    Knee flexion 4/5 3+/5  Knee extension 4/5 4-/5  Ankle dorsiflexion 5/5 5/5  Ankle plantarflexion 5/5 5/5  Ankle inversion    Ankle eversion     (Blank rows = not tested)   GAIT: Distance walked: lobby to treatment area Assistive device utilized: None Level of assistance: Complete Independence Comments: dec gait speed, antalgic, no LOB  TREATMENT DATE:   [facility] Adult PT Treatment:                                                DATE: 08/14/24 Therapeutic Exercise: Sustained lumbar extension in lying x90s, inc, Worse low back pain Repeated lumbar flexion in sitting x10: dec, better Slouch overcorrect x10 Pt edu  HEP  PATIENT EDUCATION:  Education details: Pt educated on relevant anatomy, physiology, pathology, diagnosis, prognosis, progression of care, pain and activity modification related to low back pain Person educated: Patient Education method: Explanation, Demonstration, and Handouts Education comprehension: verbalized understanding and returned demonstration  HOME EXERCISE PROGRAM: Access Code: [email or web address] URL: [email or web address] Date: 08/14/2024 Prepared by: Stann Ohara  Exercises - Seated Lumbar Flexion Stretch  - 5 x daily - 7 x weekly - 1 sets - 10 reps - 2  hold - slouch overcorrect on couch or chair   - 1 x daily - 7 x weekly - 1 sets - 30 reps - 2 hold  ASSESSMENT:  CLINICAL IMPRESSION: Patient is a 68 y.o. F who was seen today for physical therapy evaluation and treatment for low back pain. Symptoms are consistent with mechanical dysfunction of the facets with inc symptoms in extension or closing moment of the spine and improve with flexion in sitting. There is nerve involvement which likely stems from closing moment with radicular symptoms produced, I anticipate symptoms will improve in the legs with her prescribed HEP today, and allow for subsequent visits to address strength and stability to restore PLOF. Pt stands to benefit from continued skilled physical therapy to address deficit areas and restore safety with activities and participations at home and in the community.     OBJECTIVE IMPAIRMENTS: cardiopulmonary status limiting activity, decreased activity tolerance, decreased balance, decreased coordination, decreased knowledge of condition, decreased mobility, difficulty walking, decreased ROM, decreased strength, impaired perceived functional ability, impaired flexibility, and pain.   ACTIVITY LIMITATIONS: carrying, lifting, sitting, standing, squatting, sleeping, and bed mobility  PARTICIPATION LIMITATIONS: cleaning, laundry, community activity, occupation, and yard work  PERSONAL FACTORS: Age, Fitness, Past/current experiences, Time since onset of injury/illness/exacerbation, and 3+ comorbidities: see qctive problem list are also affecting patient's functional outcome.   REHAB POTENTIAL: Good  CLINICAL DECISION MAKING: Stable/uncomplicated  EVALUATION COMPLEXITY: Low   GOALS: Goals reviewed with patient? Yes  SHORT TERM GOALS: Target date: 09/11/24   Pt will report compliance with HEP to work towards ind and home management strategies Baseline: Goal status: INITIAL   2.  Pt will score no greater than 21/50 on ODI to  demonstrate improved activity tolerance Baseline:  Goal status: INITIAL   3.  Pt will improve lumbar ROM to full and painless in order to demonstrate progress towards activity tolerance and improved function Baseline:  Goal status: INITIAL      LONG TERM GOALS: Target date: 10/09/24   Pt will score no greater than 11/50 on ODI to demonstrate improved activity tolerance Baseline:  Goal status: INITIAL   2.  Pt will report no greater than 3/10 pain over 7 consecutive days to demonstrate maintained reduction in symptoms and improved tolerance to activity Baseline:  Goal status: INITIAL   3.  Pt will be ind in the management of their symptoms at home and in the community Baseline:  Goal status: INITIAL      PLAN:  PT FREQUENCY: 1-2x/week  PT DURATION: 8 weeks  PLANNED INTERVENTIONS: 97110-Therapeutic exercises, 97530- Therapeutic activity, [id number]- Neuromuscular re-education, 97535- Self Care, [id number]- Manual therapy, [id number]- Gait training, [phone]- Aquatic Therapy, [phone]- Electrical stimulation (unattended), 20560 (1-2 muscles), 20561 (3+ muscles)- Dry Needling, Patient/Family education, Cryotherapy, and Moist heat.  PLAN FOR NEXT SESSION: progress activity as tolerated, lower quarter strength and stability through functional movement patterns, modify HEP as indicated   Stann DELENA Ohara, PT 08/14/2024, 11:31 PM

## 2024-08-26 ENCOUNTER — Ambulatory Visit: Admitting: Physical Therapy

## 2024-08-26 ENCOUNTER — Encounter: Payer: Self-pay | Admitting: Physical Therapy

## 2024-08-26 DIAGNOSIS — M6281 Muscle weakness (generalized): Secondary | ICD-10-CM

## 2024-08-26 DIAGNOSIS — M5459 Other low back pain: Secondary | ICD-10-CM

## 2024-08-26 DIAGNOSIS — M79605 Pain in left leg: Secondary | ICD-10-CM

## 2024-08-26 DIAGNOSIS — M79604 Pain in right leg: Secondary | ICD-10-CM

## 2024-08-26 DIAGNOSIS — R2689 Other abnormalities of gait and mobility: Secondary | ICD-10-CM

## 2024-08-26 NOTE — Therapy (Signed)
 OUTPATIENT PHYSICAL THERAPY THORACOLUMBAR TREATMENT   Patient Name: Amanda Bennett MRN: [id number] DOB:1956-08-19, 68 y.o., female Today's Date: 08/26/2024  END OF SESSION:  PT End of Session - 08/26/24 0959     Visit Number 2    Number of Visits 11    Date for Recertification  10/09/24    Progress Note Due on Visit 10    PT Start Time 1000    PT Stop Time 1045    PT Time Calculation (min) 45 min    Activity Tolerance Patient tolerated treatment well    Behavior During Therapy [facility] for tasks assessed/performed           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   Anxiety    Bruises easily    Cervical spinal stenosis 2008   Chronic constipation    Chronic pain syndrome    neck and lower back   COPD (chronic obstructive pulmonary disease) (HCC)    Depression    Family history of adverse reaction to anesthesia    mother--  ponv   GERD (gastroesophageal reflux disease)    History of basal cell carcinoma excision    History of prolonged Q-T interval on ECG    per cardiologist, dr darron, note in epic-- in the setting of treatment with medications that can cause prolonged QT, went back to normal after stopping the medications (was seen by dr fernande no further workup recommended)   History of staph infection    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   Hypertension    Insomnia    Lesion of ulnar nerve    Mild carotid artery disease    per duplex 08-23-2017  bilateral ICA 1-39%   OA (osteoarthritis)    neck and back   Occlusion of left vertebral artery    noted 05-28-2017 MRI;  followed by cardiologist, dr darron (per cardiologist has collaterals)   Osteoporosis    Perirectal abscess 07/08/2018   PONV (postoperative nausea and vomiting)    sometimes queezy   Primary localized osteoarthrosis, pelvic region and thigh    Wears dentures    upper   Wears glasses    Past Surgical History:   Procedure Laterality Date   ABDOMINAL HYSTERECTOMY  age 63s   CARPAL TUNNEL RELEASE Bilateral 2018   CHOLECYSTECTOMY     CHOLECYSTECTOMY  10/30/2011   Procedure: LAPAROSCOPIC CHOLECYSTECTOMY WITH INTRAOPERATIVE CHOLANGIOGRAM;  Surgeon: Donnice POUR. Belinda, MD;  Location: [facility];  Service: General;  Laterality: N/A;   COLONOSCOPY     DORSAL COMPARTMENT RELEASE Left 07/20/2015   Procedure: RELEASE DORSAL COMPARTMENT (DEQUERVAIN);  Surgeon: Norleen JINNY Maltos, MD;  Location: [facility];  Service: Orthopedics;  Laterality: Left;   EVALUATION UNDER ANESTHESIA WITH FISTULECTOMY N/A 10/02/2018   Procedure: ANAL EXAM UNDER ANESTHESIA;  Surgeon: Debby Hila, MD;  Location: [facility];  Service: General;  Laterality: N/A;   HEMORRHOID SURGERY  11/25/2006   dr adel  @[facility]    I & D, ORIF RIGHT RING FINGER  Oct 9th, 15th ,2009   near amputation from dog bite   INCISION AND DRAINAGE PERIRECTAL ABSCESS N/A 07/08/2018   Procedure: IRRIGATION AND DEBRIDEMENT PERIRECTAL ABSCESS;  Surgeon: Tye Millet, DO;  Location: [facility];  Service: General;  Laterality: N/A;   INGUINAL LYMPH NODE BIOPSY Right 02/03/2014   Procedure: INGUINAL LYMPH NODE BIOPSY;  Surgeon: Dann FORBES Hummer, MD;  Location: [facility];  Service: General;  Laterality: Right;  right inguinal area   LAPAROSCOPIC SALPINGOOPHERECTOMY Bilateral age 38s   LIGATION OF INTERNAL FISTULA TRACT N/A 04/01/2019   Procedure: LIGATION OF INTERNAL FISTULA TRACT RESECTION OF ANAL LESION;  Surgeon: Debby Hila, MD;  Location: [facility];  Service: General;  Laterality: N/A;   LOOP RECORDER REMOVAL N/A 11/20/2023   Procedure: LOOP RECORDER REMOVAL;  Surgeon: Nancey Eulas BRAVO, MD;  Location: [facility];  Service: Cardiovascular;  Laterality: N/A;   ORIF CLAVICLE FRACTURE Right 2003   HARDWARE REMOVAL 08-22-2004 BY DR NORRIS @[facility]    PACEMAKER IMPLANT N/A 11/20/2023   Procedure: PACEMAKER IMPLANT -  DUAL CHAMBER;  Surgeon: Nancey Eulas BRAVO, MD;  Location: [facility];  Service: Cardiovascular;  Laterality: N/A;   PLACEMENT OF SETON N/A 10/02/2018   Procedure: PLACEMENT OF SETON VS FISTULOTOMY, BIOPSY;  Surgeon: Debby Hila, MD;  Location: [facility];  Service: General;  Laterality: N/A;   SHOULDER ARTHROSCOPY Left 07/20/2015   Procedure: ARTHROSCOPY SHOULDER DEBRIDEMENT AND DECOMPRESSION;  Surgeon: Norleen JINNY Maltos, MD;  Location: [facility];  Service: Orthopedics;  Laterality: Left;   SHOULDER ARTHROSCOPY Left 01/12/2016   Procedure: ARTHROSCOPY SHOULDER WITH DEBRIDEMENT AND EXCISION OF THE DISTAL CLAVICLE;  Surgeon: Norleen JINNY Maltos, MD;  Location: [facility];  Service: Orthopedics;  Laterality: Left;   Patient Active Problem List   Diagnosis Date Noted   Snoring 12/12/2023   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   Palpitations 10/01/2014   Tobacco abuse 07/23/2014   Mouth lesion 07/23/2014   Sore throat 07/23/2014   Postoperative wound infection 02/13/2014   Inguinal lymphadenopathy 12/30/2013   Family history of long QT syndrome 09/18/2013   Chest pain on exertion 09/18/2013   Rash and nonspecific skin eruption 09/18/2013   Leg pain 09/18/2013   Right groin pain 06/18/2013   Preventative health care 06/18/2013   CAFL (chronic airflow limitation) (HCC) 04/10/2013   BP (high blood pressure) 04/10/2013   Closed traumatic PIP dislocation 04/07/2013   Abnormality of gait 12/16/2012   Asthma 08/21/2012   Myofascial pain dysfunction syndrome 06/25/2012   Bursitis of hip, right 04/09/2012   Hypertension 04/09/2012   Headache 04/09/2012   Hyperlipidemia 04/09/2012   Low back pain 12/26/2011   Lumbar spondylosis 12/26/2011   Localized primary carpometacarpal osteoarthritis 11/07/2011   Chronic neck pain 11/07/2011   Chronic calculous cholecystitis 10/03/2011   Skin lesion of right arm 10/02/2011   Osteoporosis 10/02/2011   Gallstone 10/02/2011    Hot flash, menopausal 01/17/2011   History of hepatitis C 06/15/2010   CARPAL TUNNEL SYNDROME, BILATERAL 12/06/2009   GERD 06/22/2009   Depression with anxiety 05/25/2009   DISC DISEASE, CERVICAL 05/25/2009   Insomnia 05/25/2009    PCP: Ryan Hives, MD  REFERRING PROVIDER: Deatrice Manus, MD  REFERRING DIAG:  Diagnosis  R29.898 (ICD-10-CM) - Other symptoms and signs involving the musculoskeletal system    Rationale for Evaluation and Treatment: Rehabilitation  THERAPY DIAG:  Other low back pain  Pain in right leg  Pain in left leg  Muscle weakness (generalized)  Other abnormalities of gait and mobility  ONSET DATE: 10+ years, chronic  SUBJECTIVE:  SUBJECTIVE STATEMENT:  I have had a rough time especially in the morning. 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  I feel the pain in my lower back. If I flex while sitting and it does stretch my low back. My pain is a 5/10 this morning mostly Left low back.  I want to develop a good morning routine.   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 She went through PT with some relief, then was able to go 10 years before follow up with MD for back pain. Pt felt that 6 months ago her back pain increased and has been overall worsening. Pt notes paraesthesia in BLE with symptoms radicular in nature to the toes bilaterally, L>R. Symptoms in the low back extend across the back.  Completing bridging, trunk rotation, toe touches, hamstring stretches. OH lat stretches. Symptoms range in intensity from 3/10-10/10. Pt uses heating pad which helps with symptoms. Low back and LLE constant, R leg intermittent. LLE symptoms will  sometimes be centralized to the knee. Prolonged positioning inc pain and changing position helps with symptoms. Pt has fallen 3 times in the last 6 months when she loses her balance while walking. Had injection in L hip 6 weeks ago, some relief noted. Spinal injection 2 weeks ago, not significant improvement. Considering spinal stimulator to help with symptoms.   PERTINENT HISTORY:  Pt has had chronic pain in her lumbar spine which has limited her ability to work as a paediatric nurse. Pt being followed by neuro with considerations for spinal stimulator  PAIN:  Are you having pain? Yes: NPRS scale: 7/10 Pain location: low back  Pain description: ache, sore Aggravating factors: static positioning Relieving factors: variable positioning   PRECAUTIONS: None  RED FLAGS: None   WEIGHT BEARING RESTRICTIONS: No  FALLS:  Has patient fallen in last 6 months? Yes. 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  DIAGNOSTIC FINDINGS:  See imaging section of chart  PATIENT SURVEYS:  Modified Oswestry:  MODIFIED OSWESTRY DISABILITY SCALE  Date: 08/14/24 Score  Pain intensity 2 =  Pain medication provides me with complete relief from pain.  2. Personal care (washing, dressing, etc.) 1 =  I can take care of myself normally, but it increases my pain.  3. Lifting 4 = I can lift only very light weights  4. Walking 3 =  Pain prevents me from walking more than  mile.  5. Sitting 4 =  Pain prevents me from sitting more than 10 minutes.  6. Standing 4 =  Pain prevents me from standing more  than 10 minutes.  7. Sleeping 3 =  Even when I take pain medication, I sleep less than 4 hours.  8. Social Life 4 =  Pain has restricted my social life to my home  9. Traveling 4 = My pain restricts my travel to short necessary journeys under 1/2 hour.  10. Employment/ Homemaking 2 = I can perform most of my  homemaking/job duties, but pain prevents me from performing more physically stressful activities (eg, lifting, vacuuming).  Total 31/50   Interpretation of scores: Score Category Description  0-20% Minimal Disability The patient can cope with most living activities. Usually no treatment is indicated apart from advice on lifting, sitting and exercise  21-40% Moderate Disability The patient experiences more pain and difficulty with sitting, lifting and standing. Travel and social life are more difficult and they may be disabled from work. Personal care, sexual activity and sleeping are not grossly affected, and the patient can usually be managed by conservative means  41-60% Severe Disability Pain remains the main problem in this group, but activities of daily living are affected. These patients require a detailed investigation  61-80% Crippled Back pain impinges on all aspects of the patients life. Positive intervention is required  81-100% Bed-bound These patients are either bed-bound or exaggerating their symptoms  Bluford FORBES Zoe DELENA Karon DELENA, et al. Surgery versus conservative management of stable thoracolumbar fracture: the PRESTO feasibility RCT. [place] ([place]): [employer]; 2021 Nov. [facility] Technology Assessment, No. 25.62.) Appendix 3, Oswestry Disability Index category descriptors. Available from: Findjewelers.cz  Minimally Clinically Important Difference (MCID) = 12.8%  COGNITION: Overall cognitive status: Within functional limits for tasks assessed     SENSATION: WFL   POSTURE: rounded shoulders, forward head, and weight shift  left  PALPATION: Inc TTP in the lumbar paraspinals and sacral region  LUMBAR ROM: Pt able to complete extension, side glide bilaterally without symptoms during, notes inc soreness following.   AROM eval  Flexion 25% loss  Extension Nil loss  Right lateral flexion Nil loss  Left lateral flexion Nil loss  Right rotation   Left rotation    (Blank rows = not tested)  LOWER EXTREMITY ROM:     Active  Right eval Left eval  Hip flexion    Hip extension    Hip abduction    Hip adduction    Hip internal rotation    Hip external rotation    Knee flexion    Knee extension    Ankle dorsiflexion    Ankle plantarflexion    Ankle inversion    Ankle eversion     (Blank rows = not tested)  LOWER EXTREMITY MMT:    MMT Right eval Left eval  Hip flexion 4-/5 3+/5  Hip extension    Hip abduction 3+/5 3+/5  Hip adduction 4/5 4/5  Hip internal rotation    Hip external rotation    Knee flexion 4/5 3+/5  Knee extension 4/5 4-/5  Ankle dorsiflexion 5/5 5/5  Ankle plantarflexion 5/5 5/5  Ankle inversion    Ankle eversion     (Blank rows = not tested)   GAIT: Distance walked: lobby to treatment area Assistive device utilized: None Level of assistance: Complete Independence Comments: dec gait speed, antalgic, no LOB  TREATMENT DATE:  [facility] Adult PT Treatment:                                                DATE: 08-26-24 Therapeutic Exercise: Seated Lumbar Flexion Stretch  - 30 sec forward and to sides each  30 sec Supine Pelvic Tilt   Supine LTR 5 x 10 sec each SKTC R and L  5  x 10 sec Bridge with Ball Between Knees   3 x 10 Clamshell  with RTB   2 x 10 on R and L Manual Therapy: LAD Quadratus lumborum L STW with relief of tension  Self Care: Posture education  and optometrist education with demo of lifting stool with VC and TC  Home care for Left LE distraction on step    [facility] Adult PT Treatment:                                                DATE:  08/14/24 Therapeutic Exercise: Sustained lumbar extension in lying x90s, inc, Worse low back pain Repeated lumbar flexion in sitting x10: dec, better Slouch overcorrect x10 Pt edu  HEP                                                                                                                                   PATIENT EDUCATION:  Education details: Pt educated on relevant anatomy, physiology, pathology, diagnosis, prognosis, progression of care, pain and activity modification related to low back pain Person educated: Patient Education method: Explanation, Demonstration, and Handouts Education comprehension: verbalized understanding and returned demonstration  HOME EXERCISE PROGRAM: Access Code: [email or web address] URL: [email or web address] Date: 08/14/2024 Prepared by: Stann Ohara  Exercises - Seated Lumbar Flexion Stretch  - 5 x daily - 7 x weekly - 1 sets - 10 reps - 2 hold - slouch overcorrect on couch or chair   - 1 x daily - 7 x weekly - 1 sets - 30 reps - 2 hold Added  08-26-24 - Seated Lumbar Flexion Stretch  - 5 x daily - 7 x weekly - 1 sets - 10 reps - 2 hold - slouch overcorrect on couch or chair   - 1 x daily - 7 x weekly - 1 sets - 30 reps - 2 hold - Supine Pelvic Tilt  - 1 x daily - 7 x weekly - 3 sets - 10 reps - Supine Lower Trunk Rotation  - 1-2 x daily - 5-10 reps - 10 seconds hold - Supine Single Knee to Chest Stretch  - 2 x daily - 7 x weekly - 1 sets - 5 reps - 10 hold - Supine Piriformis Stretch with Foot on Ground  - 1-2 x daily - 3 sets - 3 reps - 20 seconds hold - Supine Bridge with Mini Swiss Ball Between Knees  - 1 x daily - 7 x weekly - 3 sets - 10 reps - Clamshell with Resistance  - 1 x daily - 7 x weekly - 3 sets - 10 reps    ASSESSMENT:  CLINICAL IMPRESSION: Pt enters clinic  with 5/10 pain and Left radiating pain into L LE.  Pt interested in developing a good exercise especially for morning.  LAD did relieve pain and confirmed nerve  involvement in L LE. Discussed ways to  utilize at home for distracting left LE on step. Pt was able to perform all exercises on updated HEP with no adverse effects.  Pt was able to reduce pain to 1/10 with L Quadratus STW.  Pt will continue POC and compliance with HEP.  EVAL- Patient is a 68 y.o. F who was seen today for physical therapy evaluation and treatment for low back pain. Symptoms are consistent with mechanical dysfunction of the facets with inc symptoms in extension or closing moment of the spine and improve with flexion in sitting. There is nerve involvement which likely stems from closing moment with radicular symptoms produced, I anticipate symptoms will improve in the legs with her prescribed HEP today, and allow for subsequent visits to address strength and stability to restore PLOF. Pt stands to benefit from continued skilled physical therapy to address deficit areas and restore safety with activities and participations at home and in the community.     OBJECTIVE IMPAIRMENTS: cardiopulmonary status limiting activity, decreased activity tolerance, decreased balance, decreased coordination, decreased knowledge of condition, decreased mobility, difficulty walking, decreased ROM, decreased strength, impaired perceived functional ability, impaired flexibility, and pain.   ACTIVITY LIMITATIONS: carrying, lifting, sitting, standing, squatting, sleeping, and bed mobility  PARTICIPATION LIMITATIONS: cleaning, laundry, community activity, occupation, and yard work  PERSONAL FACTORS: Age, Fitness, Past/current experiences, Time since onset of injury/illness/exacerbation, and 3+ comorbidities: see qctive problem list are also affecting patient's functional outcome.   REHAB POTENTIAL: Good  CLINICAL DECISION MAKING: Stable/uncomplicated  EVALUATION COMPLEXITY: Low   GOALS: Goals reviewed with patient? Yes  SHORT TERM GOALS: Target date: 09/11/24   Pt will report compliance with HEP to work  towards ind and home management strategies Baseline: Goal status: INITIAL   2.  Pt will score no greater than 21/50 on ODI to demonstrate improved activity tolerance Baseline:  Goal status: INITIAL   3.  Pt will improve lumbar ROM to full and painless in order to demonstrate progress towards activity tolerance and improved function Baseline:  Goal status: INITIAL      LONG TERM GOALS: Target date: 10/09/24   Pt will score no greater than 11/50 on ODI to demonstrate improved activity tolerance Baseline:  Goal status: INITIAL   2.  Pt will report no greater than 3/10 pain over 7 consecutive days to demonstrate maintained reduction in symptoms and improved tolerance to activity Baseline:  Goal status: INITIAL   3.  Pt will be ind in the management of their symptoms at home and in the community Baseline:  Goal status: INITIAL      PLAN:  PT FREQUENCY: 1-2x/week  PT DURATION: 8 weeks  PLANNED INTERVENTIONS: 97110-Therapeutic exercises, 97530- Therapeutic activity, [id number]- Neuromuscular re-education, 97535- Self Care, [id number]- Manual therapy, [id number]- Gait training, [phone]- Aquatic Therapy, [phone]- Electrical stimulation (unattended), 20560 (1-2 muscles), 20561 (3+ muscles)- Dry Needling, Patient/Family education, Cryotherapy, and Moist heat.  PLAN FOR NEXT SESSION: progress activity as tolerated, lower quarter strength and stability through functional movement patterns, modify HEP as indicated   Graydon Dingwall, PT, [facility] Certified Exercise Expert for the Aging Adult  08/26/2024 12:39 PM Phone: [phone] Fax: [phone]

## 2024-08-26 NOTE — Patient Instructions (Signed)
 Sleeping on Back  Place pillow under knees. A pillow with cervical support and a roll around waist are also helpful. Copyright  [employer]. All rights reserved.  Sleeping on Side Place pillow between knees. Use cervical support under neck and a roll around waist as needed. Copyright  [employer]. All rights reserved.   Sleeping on Stomach   If this is the only desirable sleeping position, place pillow under lower legs, and under stomach or chest as needed.  Posture - Sitting   Sit upright, head facing forward. Try using a roll to support lower back. Keep shoulders relaxed, and avoid rounded back. Keep hips level with knees. Avoid crossing legs for long periods. Stand to Sit / Sit to Stand   To sit: Bend knees to lower self onto front edge of chair, then scoot back on seat. To stand: Reverse sequence by placing one foot forward, and scoot to front of seat. Use rocking motion to stand up.   Work Height and Reach  Ideal work height is no more than 2 to 4 inches below elbow level when standing, and at elbow level when sitting. Reaching should be limited to arm's length, with elbows slightly bent.  Bending  Bend at hips and knees, not back. Keep feet shoulder-width apart.    Posture - Standing   Good posture is important. Avoid slouching and forward head thrust. Maintain curve in low back and align ears over shoul- ders, hips over ankles.  Alternating Positions   Alternate tasks and change positions frequently to reduce fatigue and muscle tension. Take rest breaks. Computer Work   Position work to art gallery manager. Use proper work and seat height. Keep shoulders back and down, wrists straight, and elbows at right angles. Use chair that provides full back support. Add footrest and lumbar roll as needed.  Getting Into / Out of Car  Lower self onto seat, scoot back, then bring in one leg at a time. Reverse sequence to get out.  Dressing  Lie on back to pull socks or slacks over feet, or sit  and bend leg while keeping back straight.    Housework - Sink  Place one foot on ledge of cabinet under sink when standing at sink for prolonged periods.   Pushing / Pulling  Pushing is preferable to pulling. Keep back in proper alignment, and use leg muscles to do the work.  Deep Squat   Squat and lift with both arms held against upper trunk. Tighten stomach muscles without holding breath. Use smooth movements to avoid jerking.  Avoid Twisting   Avoid twisting or bending back. Pivot around using foot movements, and bend at knees if needed when reaching for articles.  Carrying Luggage   Distribute weight evenly on both sides. Use a cart whenever possible. Do not twist trunk. Move body as a unit.   Lifting Principles Maintain proper posture and head alignment. Slide object as close as possible before lifting. Move obstacles out of the way. Test before lifting; ask for help if too heavy. Tighten stomach muscles without holding breath. Use smooth movements; do not jerk. Use legs to do the work, and pivot with feet. Distribute the work load symmetrically and close to the center of trunk. Push instead of pull whenever possible.   Ask For Help   Ask for help and delegate to others when possible. Coordinate your movements when lifting together, and maintain the low back curve.  Log Roll   Lying on back, bend left knee and place left  arm across chest. Roll all in one movement to the right. Reverse to roll to the left. Always move as one unit. Housework - Sweeping  Use long-handled equipment to avoid stooping.   Housework - Wiping  Position yourself as close as possible to reach work surface. Avoid straining your back.  Laundry - Unloading Wash   To unload small items at bottom of washer, lift leg opposite to arm being used to reach.  Gardening - Raking  Move close to area to be raked. Use arm movements to do the work. Keep back straight and avoid twisting.      Cart  When reaching into cart with one arm, lift opposite leg to keep back straight.   Getting Into / Out of Bed  Lower self to lie down on one side by raising legs and lowering head at the same time. Use arms to assist moving without twisting. Bend both knees to roll onto back if desired. To sit up, start from lying on side, and use same move-ments in reverse. Housework - Vacuuming  Hold the vacuum with arm held at side. Step back and forth to move it, keeping head up. Avoid twisting.   Laundry - [employer] Officer so that bending and twisting can be avoided.   Laundry - Unloading Dryer  Squat down to reach into clothes dryer or use a reacher.  Gardening - Weeding / Psychiatric Nurse or Kneel. Knee pads may be helpful.                   Posture Tips DO: - stand tall and erect - keep chin tucked in - keep head and shoulders in alignment - check posture regularly in mirror or large window - pull head back against headrest in car seat;  Change your position often.  Sit with lumbar support. DON'T: - slouch or slump while watching TV or reading - sit, stand or lie in one position  for too long;  Sitting is especially hard on the spine so if you sit at a desk/use the computer, then stand up often!   Copyright  [employer]. All rights reserved.  Posture - Standing   Good posture is important. Avoid slouching and forward head thrust. Maintain curve in low back and align ears over shoul- ders, hips over ankles.  Pull your belly button in toward your back bone.   Copyright  [employer]HI. All rights reserved.  Posture - Sitting   Sit upright, head facing forward. Try using a roll to support lower back. Keep shoulders relaxed, and avoid rounded back. Keep hips level with knees. Avoid crossing legs for long periods.   Copyright  VHI. All rights reserved.    Graydon Dingwall, PT, ATRIC Certified Exercise Expert for the Aging Adult  08/26/2024 10:05 AM Phone:  [phone] Fax: [phone]

## 2024-09-01 ENCOUNTER — Ambulatory Visit

## 2024-09-04 ENCOUNTER — Ambulatory Visit

## 2024-09-08 ENCOUNTER — Ambulatory Visit

## 2024-09-08 ENCOUNTER — Telehealth: Payer: Self-pay

## 2024-09-08 NOTE — Therapy (Incomplete)
 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   Anxiety    Bruises easily    Cervical spinal stenosis 2008   Chronic constipation    Chronic pain syndrome    neck and lower back   COPD (chronic obstructive pulmonary disease) (HCC)    Depression    Family history of adverse reaction to anesthesia    mother--  ponv   GERD (gastroesophageal reflux disease)    History of basal cell carcinoma excision    History of prolonged Q-T interval on ECG    per cardiologist, dr darron, note in epic-- in the setting of treatment with medications that can cause prolonged QT, went back to normal after stopping the medications (was seen by dr fernande no further workup recommended)   History of staph infection    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   Hypertension    Insomnia    Lesion of ulnar nerve    Mild carotid artery disease    per duplex 08-23-2017  bilateral ICA 1-39%   OA (osteoarthritis)    neck and back   Occlusion of left vertebral artery    noted 05-28-2017 MRI;  followed by cardiologist, dr darron (per cardiologist has collaterals)   Osteoporosis    Perirectal abscess 07/08/2018   PONV (postoperative nausea and vomiting)    sometimes queezy   Primary localized osteoarthrosis, pelvic region and thigh    Wears dentures    upper   Wears glasses    Past Surgical History:  Procedure Laterality Date   ABDOMINAL HYSTERECTOMY  age 45s   CARPAL TUNNEL RELEASE Bilateral 2018   CHOLECYSTECTOMY     CHOLECYSTECTOMY  10/30/2011   Procedure: LAPAROSCOPIC CHOLECYSTECTOMY WITH INTRAOPERATIVE CHOLANGIOGRAM;  Surgeon: Donnice POUR. Belinda, MD;  Location: [facility];  Service: General;  Laterality: N/A;   COLONOSCOPY     DORSAL COMPARTMENT RELEASE  Left 07/20/2015   Procedure: RELEASE DORSAL COMPARTMENT (DEQUERVAIN);  Surgeon: Norleen JINNY Maltos, MD;  Location: [facility];  Service: Orthopedics;  Laterality: Left;   EVALUATION UNDER ANESTHESIA WITH FISTULECTOMY N/A 10/02/2018   Procedure: ANAL EXAM UNDER ANESTHESIA;  Surgeon: Debby Hila, MD;  Location: [facility];  Service: General;  Laterality: N/A;   HEMORRHOID SURGERY  11/25/2006   dr adel  @[facility]    I & D, ORIF RIGHT RING FINGER  Oct 9th, 15th ,2009   near amputation from dog bite   INCISION AND DRAINAGE PERIRECTAL ABSCESS N/A 07/08/2018   Procedure: IRRIGATION AND DEBRIDEMENT PERIRECTAL ABSCESS;  Surgeon: Tye Millet, DO;  Location: [facility];  Service: General;  Laterality: N/A;   INGUINAL LYMPH NODE BIOPSY Right 02/03/2014   Procedure: INGUINAL LYMPH NODE BIOPSY;  Surgeon: Dann FORBES Hummer, MD;  Location:  SURGERY CENTER;  Service: General;  Laterality: Right;  right inguinal area   LAPAROSCOPIC SALPINGOOPHERECTOMY Bilateral age 67s   LIGATION OF INTERNAL FISTULA TRACT N/A 04/01/2019   Procedure: LIGATION OF INTERNAL FISTULA TRACT RESECTION OF ANAL LESION;  Surgeon: Debby Hila, MD;  Location: [facility];  Service: General;  Laterality: N/A;   LOOP RECORDER REMOVAL N/A 11/20/2023   Procedure: LOOP RECORDER REMOVAL;  Surgeon: Nancey Eulas FORBES, MD;  Location: [facility];  Service: Cardiovascular;  Laterality: N/A;   ORIF CLAVICLE FRACTURE Right 2003   HARDWARE REMOVAL 08-22-2004 BY DR NORRIS @[facility]    PACEMAKER IMPLANT N/A 11/20/2023   Procedure: PACEMAKER IMPLANT - DUAL CHAMBER;  Surgeon: Nancey Eulas BRAVO, MD;  Location: [facility];  Service: Cardiovascular;  Laterality: N/A;   PLACEMENT OF SETON N/A 10/02/2018   Procedure: PLACEMENT OF SETON VS FISTULOTOMY, BIOPSY;  Surgeon: Debby Hila, MD;  Location: [facility];  Service: General;  Laterality: N/A;   SHOULDER ARTHROSCOPY Left 07/20/2015    Procedure: ARTHROSCOPY SHOULDER DEBRIDEMENT AND DECOMPRESSION;  Surgeon: Norleen JINNY Maltos, MD;  Location: [facility];  Service: Orthopedics;  Laterality: Left;   SHOULDER ARTHROSCOPY Left 01/12/2016   Procedure: ARTHROSCOPY SHOULDER WITH DEBRIDEMENT AND EXCISION OF THE DISTAL CLAVICLE;  Surgeon: Norleen JINNY Maltos, MD;  Location: [facility];  Service: Orthopedics;  Laterality: Left;   Patient Active Problem List   Diagnosis Date Noted   Snoring 12/12/2023   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   Palpitations 10/01/2014   Tobacco abuse 07/23/2014   Mouth lesion 07/23/2014   Sore throat 07/23/2014   Postoperative wound infection 02/13/2014   Inguinal lymphadenopathy 12/30/2013   Family history of long QT syndrome 09/18/2013   Chest pain on exertion 09/18/2013   Rash and nonspecific skin eruption 09/18/2013   Leg pain 09/18/2013   Right groin pain 06/18/2013   Preventative health care 06/18/2013   CAFL (chronic airflow limitation) (HCC) 04/10/2013   BP (high blood pressure) 04/10/2013   Closed traumatic PIP dislocation 04/07/2013   Abnormality of gait 12/16/2012   Asthma 08/21/2012   Myofascial pain dysfunction syndrome 06/25/2012   Bursitis of hip, right 04/09/2012   Hypertension 04/09/2012   Headache 04/09/2012   Hyperlipidemia 04/09/2012   Low back pain 12/26/2011   Lumbar spondylosis 12/26/2011   Localized primary carpometacarpal osteoarthritis 11/07/2011   Chronic neck pain 11/07/2011   Chronic calculous cholecystitis 10/03/2011   Skin lesion of right arm 10/02/2011   Osteoporosis 10/02/2011   Gallstone 10/02/2011   Hot flash, menopausal 01/17/2011   History of hepatitis C 06/15/2010   CARPAL TUNNEL SYNDROME, BILATERAL 12/06/2009   GERD 06/22/2009   Depression with anxiety 05/25/2009   DISC DISEASE, CERVICAL 05/25/2009   Insomnia 05/25/2009    PCP: Ryan Hives, MD  REFERRING PROVIDER: Deatrice Manus, MD  REFERRING DIAG:  Diagnosis  R29.898 (ICD-10-CM) - Other  symptoms and signs involving the musculoskeletal system    Rationale for Evaluation and Treatment: Rehabilitation  THERAPY DIAG:  No diagnosis found.  ONSET DATE: 10+ years, chronic  SUBJECTIVE:  SUBJECTIVE STATEMENT: *** I have had a rough time especially in the morning. 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  I feel the pain in my lower back. If I flex while sitting and it does stretch my low back. My pain is a 5/10 this morning mostly Left low back.  I want to develop a good morning routine.   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 She went through PT with some relief, then was able to go 10 years before follow up with MD for back pain. Pt felt that 6 months ago her back pain increased and has been overall worsening. Pt notes paraesthesia in BLE with symptoms radicular in nature to the toes bilaterally, L>R. Symptoms in the low back extend across the back.  Completing bridging, trunk rotation, toe touches, hamstring stretches. OH lat stretches. Symptoms range in intensity from 3/10-10/10. Pt uses heating pad which helps with symptoms. Low back and LLE constant, R leg intermittent. LLE symptoms will sometimes be centralized to the knee. Prolonged positioning inc pain and changing position helps with symptoms. Pt has fallen 3 times in the last 6 months when she loses her balance while walking. Had injection in L hip 6 weeks ago, some relief noted. Spinal injection 2 weeks ago, not significant improvement. Considering spinal stimulator to help with symptoms.   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 Pt being followed by neuro with considerations for spinal stimulator  PAIN:  Are you having pain? Yes: NPRS scale: 7/10 Pain location: low back  Pain description: ache, sore Aggravating factors: static positioning Relieving factors: variable positioning   PRECAUTIONS: None  RED FLAGS: None   WEIGHT BEARING RESTRICTIONS: No  FALLS:  Has patient fallen in last 6 months? Yes. 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  DIAGNOSTIC FINDINGS:  See imaging section of chart  PATIENT SURVEYS:  Modified Oswestry:  MODIFIED OSWESTRY DISABILITY SCALE  Date: 08/14/24 Score  Pain intensity 2 =  Pain medication provides me with complete relief from pain.  2. Personal care (washing, dressing, etc.) 1 =  I can take care of myself normally, but it increases my pain.  3. Lifting 4 = I can lift only very light weights  4. Walking 3 =  Pain prevents me from walking more than  mile.  5. Sitting 4 =  Pain prevents me from sitting more than 10 minutes.  6. Standing 4 =  Pain prevents me from standing more than 10 minutes.  7. Sleeping 3 =  Even when I take pain medication, I sleep less than 4 hours.  8. Social Life 4 =  Pain has restricted my social life to my home  9. Traveling 4 = My pain restricts my travel to short necessary journeys under 1/2 hour.  10. Employment/ Homemaking 2 = I can perform most of my homemaking/job  duties, but pain prevents me from performing more physically stressful activities (eg, lifting, vacuuming).  Total 31/50   Interpretation of  scores: Score Category Description  0-20% Minimal Disability The patient can cope with most living activities. Usually no treatment is indicated apart from advice on lifting, sitting and exercise  21-40% Moderate Disability The patient experiences more pain and difficulty with sitting, lifting and standing. Travel and social life are more difficult and they may be disabled from work. Personal care, sexual activity and sleeping are not grossly affected, and the patient can usually be managed by conservative means  41-60% Severe Disability Pain remains the main problem in this group, but activities of daily living are affected. These patients require a detailed investigation  61-80% Crippled Back pain impinges on all aspects of the patients life. Positive intervention is required  81-100% Bed-bound These patients are either bed-bound or exaggerating their symptoms  Bluford FORBES Zoe DELENA Karon DELENA, et al. Surgery versus conservative management of stable thoracolumbar fracture: the PRESTO feasibility RCT. [place] ([place]): [employer]; 2021 Nov. [facility] Technology Assessment, No. 25.62.) Appendix 3, Oswestry Disability Index category descriptors. Available from: Findjewelers.cz  Minimally Clinically Important Difference (MCID) = 12.8%  COGNITION: Overall cognitive status: Within functional limits for tasks assessed     SENSATION: WFL   POSTURE: rounded shoulders, forward head, and weight shift left  PALPATION: Inc TTP in the lumbar paraspinals and sacral region  LUMBAR ROM: Pt able to complete extension, side glide bilaterally without symptoms during, notes inc soreness following.   AROM eval  Flexion 25% loss  Extension Nil loss  Right lateral flexion Nil loss  Left lateral flexion Nil loss  Right rotation   Left rotation    (Blank rows = not tested)  LOWER EXTREMITY ROM:     Active  Right eval Left eval  Hip flexion    Hip extension     Hip abduction    Hip adduction    Hip internal rotation    Hip external rotation    Knee flexion    Knee extension    Ankle dorsiflexion    Ankle plantarflexion    Ankle inversion    Ankle eversion     (Blank rows = not tested)  LOWER EXTREMITY MMT:    MMT Right eval Left eval  Hip flexion 4-/5 3+/5  Hip extension    Hip abduction 3+/5 3+/5  Hip adduction 4/5 4/5  Hip internal rotation    Hip external rotation    Knee flexion 4/5 3+/5  Knee extension 4/5 4-/5  Ankle dorsiflexion 5/5 5/5  Ankle plantarflexion 5/5 5/5  Ankle inversion    Ankle eversion     (Blank rows = not tested)   GAIT: Distance walked: lobby to treatment area Assistive device utilized: None Level of assistance: Complete Independence Comments: dec gait speed, antalgic, no LOB  TREATMENT DATE:  [facility] Adult PT Treatment:                                                DATE: 09/08/24 Therapeutic Exercise: Seated Lumbar Flexion Stretch  - 30 sec forward and to sides each  30 sec Supine LTR 5 x 10 sec each Clamshell with RTB   2 x 10 on R and L Seated hamstring curl RTB 2x10 BIL Neuromuscular re-ed: Supine PPT 3 hold 2x10 Supine  PPT with alternating march 2x30 Bridge with ball 3x10 SKTC 5 hold x10 BIL  [facility] Adult PT Treatment:                                                DATE: 08-26-24 Therapeutic Exercise: Seated Lumbar Flexion Stretch  - 30 sec forward and to sides each  30 sec Supine Pelvic Tilt   Supine LTR 5 x 10 sec each SKTC R and L  5  x 10 sec Bridge with Ball Between Knees   3 x 10 Clamshell with RTB   2 x 10 on R and L Manual Therapy: LAD Quadratus lumborum L STW with relief of tension  Self Care: Posture education  and optometrist education with demo of lifting stool with VC and TC  Home care for Left LE distraction on step  [facility] Adult PT Treatment:                                                DATE: 08/14/24 Therapeutic Exercise: Sustained lumbar extension in lying  x90s, inc, Worse low back pain Repeated lumbar flexion in sitting x10: dec, better Slouch overcorrect x10 Pt edu  HEP      PATIENT EDUCATION:  Education details: Pt educated on relevant anatomy, physiology, pathology, diagnosis, prognosis, progression of care, pain and activity modification related to low back pain Person educated: Patient Education method: Explanation, Demonstration, and Handouts Education comprehension: verbalized understanding and returned demonstration  HOME EXERCISE PROGRAM: Access Code: [email or web address] URL: [email or web address] Date: 08/14/2024 Prepared by: Stann Ohara  Exercises - Seated Lumbar Flexion Stretch  - 5 x daily - 7 x weekly - 1 sets - 10 reps - 2 hold - slouch overcorrect on couch or chair   - 1 x daily - 7 x weekly - 1 sets - 30 reps - 2 hold Added  08-26-24 - Seated Lumbar Flexion Stretch  - 5 x daily - 7 x weekly - 1 sets - 10 reps - 2 hold - slouch overcorrect on couch or chair   - 1 x daily - 7 x weekly - 1 sets - 30 reps - 2 hold - Supine Pelvic Tilt  - 1 x daily - 7 x weekly - 3 sets - 10 reps - Supine Lower Trunk Rotation  - 1-2 x daily - 5-10 reps - 10 seconds hold - Supine Single Knee to Chest Stretch  - 2 x daily - 7 x weekly - 1 sets - 5 reps - 10 hold - Supine Piriformis Stretch with Foot on Ground  - 1-2 x daily - 3 sets - 3 reps - 20 seconds hold - Supine Bridge with Mini Swiss Ball Between Knees  - 1 x daily - 7 x weekly - 3 sets - 10 reps - Clamshell with Resistance  - 1 x daily - 7 x weekly - 3 sets - 10 reps    ASSESSMENT:  CLINICAL IMPRESSION: ***  Pt enters clinic  with 5/10 pain and Left radiating pain into L LE.  Pt interested in developing a good exercise especially for morning.  LAD did relieve pain and confirmed nerve involvement in L LE. Discussed ways to utilize at home for distracting left LE  on step. Pt was able to perform all exercises on updated HEP with no adverse effects.  Pt was able to reduce pain  to 1/10 with L Quadratus STW.  Pt will continue POC and compliance with HEP.  EVAL- Patient is a 68 y.o. F who was seen today for physical therapy evaluation and treatment for low back pain. Symptoms are consistent with mechanical dysfunction of the facets with inc symptoms in extension or closing moment of the spine and improve with flexion in sitting. There is nerve involvement which likely stems from closing moment with radicular symptoms produced, I anticipate symptoms will improve in the legs with her prescribed HEP today, and allow for subsequent visits to address strength and stability to restore PLOF. Pt stands to benefit from continued skilled physical therapy to address deficit areas and restore safety with activities and participations at home and in the community.     OBJECTIVE IMPAIRMENTS: cardiopulmonary status limiting activity, decreased activity tolerance, decreased balance, decreased coordination, decreased knowledge of condition, decreased mobility, difficulty walking, decreased ROM, decreased strength, impaired perceived functional ability, impaired flexibility, and pain.   ACTIVITY LIMITATIONS: carrying, lifting, sitting, standing, squatting, sleeping, and bed mobility  PARTICIPATION LIMITATIONS: cleaning, laundry, community activity, occupation, and yard work  PERSONAL FACTORS: Age, Fitness, Past/current experiences, Time since onset of injury/illness/exacerbation, and 3+ comorbidities: see qctive problem list are also affecting patient's functional outcome.   REHAB POTENTIAL: Good  CLINICAL DECISION MAKING: Stable/uncomplicated  EVALUATION COMPLEXITY: Low   GOALS: Goals reviewed with patient? Yes  SHORT TERM GOALS: Target date: 09/11/24   Pt will report compliance with HEP to work towards ind and home management strategies Baseline: Goal status: INITIAL   2.  Pt will score no greater than 21/50 on ODI to demonstrate improved activity tolerance Baseline:  Goal  status: INITIAL   3.  Pt will improve lumbar ROM to full and painless in order to demonstrate progress towards activity tolerance and improved function Baseline:  Goal status: INITIAL      LONG TERM GOALS: Target date: 10/09/24   Pt will score no greater than 11/50 on ODI to demonstrate improved activity tolerance Baseline:  Goal status: INITIAL   2.  Pt will report no greater than 3/10 pain over 7 consecutive days to demonstrate maintained reduction in symptoms and improved tolerance to activity Baseline:  Goal status: INITIAL   3.  Pt will be ind in the management of their symptoms at home and in the community Baseline:  Goal status: INITIAL      PLAN:  PT FREQUENCY: 1-2x/week  PT DURATION: 8 weeks  PLANNED INTERVENTIONS: 97110-Therapeutic exercises, 97530- Therapeutic activity, [id number]- Neuromuscular re-education, 97535- Self Care, [id number]- Manual therapy, [id number]- Gait training, [phone]- Aquatic Therapy, [phone]- Electrical stimulation (unattended), 20560 (1-2 muscles), 20561 (3+ muscles)- Dry Needling, Patient/Family education, Cryotherapy, and Moist heat.  PLAN FOR NEXT SESSION: progress activity as tolerated, lower quarter strength and stability through functional movement patterns, modify HEP as indicated   Corean Pouch PTA 09/08/2024 8:26 AM Phone: [phone] Fax: [phone]   "

## 2024-09-08 NOTE — Telephone Encounter (Signed)
 LVM regarding missed visit. Does not have any further appointments, advised to call office to schedule.  Corean Pouch, PTA 09/08/2024 11:55 AM

## 2024-09-11 NOTE — CV Procedure (Signed)
" °  Device system confirmed to be MRI conditional, with implant date > 6 weeks ago, and no evidence of abandoned or epicardial leads in review of most recent CXR  Device last cleared by EP Provider: Daphne Barrack 09/11/2024  Clearance is good through for 1 year as long as parameters remain stable at time of check. If pt undergoes a cardiac device procedure during that time, they should be re-cleared.   Tachy-therapies to be programmed off if applicable with device back to pre-MRI settings after completion of exam.  [employer] will be present for programming for the MRI.   Rocky Catalan, RT  09/11/2024 10:15 AM     "

## 2024-09-15 ENCOUNTER — Ambulatory Visit (HOSPITAL_COMMUNITY)
Admission: RE | Admit: 2024-09-15 | Discharge: 2024-09-15 | Disposition: A | Discharge status: Home / Self Care | Source: Ambulatory Visit | Attending: Gastroenterology | Admitting: Gastroenterology

## 2024-09-15 DIAGNOSIS — K648 Other hemorrhoids: Secondary | ICD-10-CM | POA: Diagnosis present

## 2024-09-15 DIAGNOSIS — K6289 Other specified diseases of anus and rectum: Secondary | ICD-10-CM | POA: Insufficient documentation

## 2024-09-15 DIAGNOSIS — K603 Anal fistula, unspecified: Secondary | ICD-10-CM | POA: Diagnosis present

## 2024-09-15 DIAGNOSIS — Z8719 Personal history of other diseases of the digestive system: Secondary | ICD-10-CM | POA: Diagnosis present

## 2024-09-15 DIAGNOSIS — R195 Other fecal abnormalities: Secondary | ICD-10-CM | POA: Insufficient documentation

## 2024-09-15 DIAGNOSIS — K6282 Dysplasia of anus: Secondary | ICD-10-CM | POA: Diagnosis present

## 2024-09-15 DIAGNOSIS — R197 Diarrhea, unspecified: Secondary | ICD-10-CM | POA: Diagnosis present

## 2024-09-15 DIAGNOSIS — R110 Nausea: Secondary | ICD-10-CM | POA: Diagnosis present

## 2024-09-15 DIAGNOSIS — R634 Abnormal weight loss: Secondary | ICD-10-CM | POA: Insufficient documentation

## 2024-09-15 MED ORDER — GADOBUTROL 1 MMOL/ML IV SOLN
6.0000 mL | Freq: Once | INTRAVENOUS | Status: AC | PRN
  Administered 2024-09-15: 6 mL via INTRAVENOUS

## 2024-09-15 NOTE — Written Directive (Signed)
 Patient was monitored by this RN during MRI scan due to presence of a pacemaker. Cardiac rhythm was continuously monitored throughout the procedure. Prior to the start of the scan, the pacemaker was placed in MRI-safe mode by the MRI technician and/or pacemaker representative. Following the completion of the scan, the device was returned to its pre-MRI settings. Neurological status and orientation post-procedure were unchanged from baseline.   Pre-procedure Heart Rate (Prior to being placed in MRI safe mode): 69  During scan: 85  Post-procedure Heart Rate (Once pacemaker is returned to baseline mode): 71

## 2024-09-15 NOTE — Progress Notes (Incomplete)
 Patient was monitored by this RN during MRI scan due to presence of a pacemaker. Cardiac rhythm was continuously monitored throughout the procedure. Prior to the start of the scan, the pacemaker was placed in MRI-safe mode by the MRI technician and/or pacemaker representative. Following the completion of the scan, the device was returned to its pre-MRI settings. Neurological status and orientation post-procedure were unchanged from baseline.   Pre-procedure Heart Rate (Prior to being placed in MRI safe mode): 69 During scan: 85 Post-procedure Heart Rate (Once pacemaker is returned to baseline mode):

## 2024-09-16 ENCOUNTER — Ambulatory Visit: Admitting: Internal Medicine

## 2024-09-22 ENCOUNTER — Telehealth: Payer: Self-pay | Admitting: Gastroenterology

## 2024-09-22 ENCOUNTER — Other Ambulatory Visit: Payer: Self-pay

## 2024-09-22 MED ORDER — ONDANSETRON HCL 4 MG PO TABS: 4.0000 mg | ORAL_TABLET | Freq: Four times a day (QID) | ORAL | 0 refills | Status: AC | PRN

## 2024-09-22 NOTE — Telephone Encounter (Signed)
 Patient is advised and agrees to this plan of care. No further questions at this time.

## 2024-09-22 NOTE — Telephone Encounter (Signed)
 Inbound call from patient stating that she is feeling very Nauseous and would like to be prescribed something for her nausea until she is able to be seen with her surgeon. Patient is requesting that medication go to [facility] on [address]. Patient is also requesting a call back to inform her that the medication has been sent. Please advise.

## 2024-09-22 NOTE — Telephone Encounter (Signed)
 Patient states this is her typical nausea. She did have vomiting last night but no vomiting today. Afebrile. Nausea is intense today and she does not want to go into the cycle of vomiting. Can she have Zofran ?

## 2024-09-29 ENCOUNTER — Encounter: Payer: Self-pay | Admitting: Surgery

## 2024-09-30 ENCOUNTER — Other Ambulatory Visit: Payer: Self-pay | Admitting: Surgery

## 2024-09-30 DIAGNOSIS — K6289 Other specified diseases of anus and rectum: Secondary | ICD-10-CM

## 2024-09-30 DIAGNOSIS — K5900 Constipation, unspecified: Secondary | ICD-10-CM

## 2024-09-30 DIAGNOSIS — Z8719 Personal history of other diseases of the digestive system: Secondary | ICD-10-CM

## 2024-10-01 ENCOUNTER — Other Ambulatory Visit (HOSPITAL_COMMUNITY): Payer: Self-pay | Admitting: Surgery

## 2024-10-01 DIAGNOSIS — K6289 Other specified diseases of anus and rectum: Secondary | ICD-10-CM

## 2024-10-01 DIAGNOSIS — K5900 Constipation, unspecified: Secondary | ICD-10-CM

## 2024-10-01 DIAGNOSIS — Z8719 Personal history of other diseases of the digestive system: Secondary | ICD-10-CM

## 2024-10-06 ENCOUNTER — Ambulatory Visit

## 2024-10-06 DIAGNOSIS — R55 Syncope and collapse: Secondary | ICD-10-CM | POA: Diagnosis not present

## 2024-10-07 LAB — CUP PACEART REMOTE DEVICE CHECK
Battery Remaining Longevity: 115 mo
Battery Remaining Percentage: 95.5 %
Battery Voltage: 3.02 V
Brady Statistic AP VP Percent: 1 %
Brady Statistic AP VS Percent: 3.1 %
Brady Statistic AS VP Percent: 1 %
Brady Statistic AS VS Percent: 97 %
Brady Statistic RA Percent Paced: 3 %
Brady Statistic RV Percent Paced: 1 %
Date Time Interrogation Session: 20260127051841
Implantable Lead Connection Status: 753985
Implantable Lead Connection Status: 753985
Implantable Lead Implant Date: 20250312
Implantable Lead Implant Date: 20250312
Implantable Lead Location: 753859
Implantable Lead Location: 753860
Implantable Pulse Generator Implant Date: 20250312
Lead Channel Impedance Value: 430 Ohm
Lead Channel Impedance Value: 440 Ohm
Lead Channel Pacing Threshold Amplitude: 0.5 V
Lead Channel Pacing Threshold Amplitude: 1 V
Lead Channel Pacing Threshold Pulse Width: 0.5 ms
Lead Channel Pacing Threshold Pulse Width: 0.5 ms
Lead Channel Sensing Intrinsic Amplitude: 1.7 mV
Lead Channel Sensing Intrinsic Amplitude: 2.7 mV
Lead Channel Setting Pacing Amplitude: 2 V
Lead Channel Setting Pacing Amplitude: 2.5 V
Lead Channel Setting Pacing Pulse Width: 0.5 ms
Lead Channel Setting Sensing Sensitivity: 0.5 mV
Pulse Gen Model: 2272
Pulse Gen Serial Number: 8258233

## 2024-10-08 ENCOUNTER — Ambulatory Visit: Payer: Self-pay | Admitting: Cardiovascular Disease

## 2024-10-14 NOTE — Progress Notes (Signed)
 Remote PPM Transmission

## 2024-11-17 ENCOUNTER — Other Ambulatory Visit (HOSPITAL_COMMUNITY)

## 2025-01-05 ENCOUNTER — Encounter

## 2025-01-13 ENCOUNTER — Encounter (HOSPITAL_COMMUNITY): Payer: Self-pay

## 2025-01-13 ENCOUNTER — Ambulatory Visit (HOSPITAL_COMMUNITY): Admit: 2025-01-13 | Admitting: General Surgery

## 2025-01-13 SURGERY — MANOMETRY, ANORECTAL

## 2025-04-06 ENCOUNTER — Encounter

## 2025-07-06 ENCOUNTER — Encounter

## 2025-10-05 ENCOUNTER — Encounter
